# Patient Record
Sex: Male | Born: 1950 | Race: White | Hispanic: No | Marital: Married | Smoking: Former smoker
Health system: Southern US, Community
[De-identification: ages and names within clinical notes are randomized; demographics above are authoritative.]

## PROBLEM LIST (undated history)

## (undated) DIAGNOSIS — D72829 Elevated white blood cell count, unspecified: Secondary | ICD-10-CM

## (undated) DIAGNOSIS — E1165 Type 2 diabetes mellitus with hyperglycemia: Secondary | ICD-10-CM

## (undated) DIAGNOSIS — N179 Acute kidney failure, unspecified: Secondary | ICD-10-CM

## (undated) DIAGNOSIS — I472 Ventricular tachycardia, unspecified: Secondary | ICD-10-CM

## (undated) DIAGNOSIS — I219 Acute myocardial infarction, unspecified: Secondary | ICD-10-CM

## (undated) DIAGNOSIS — I5043 Acute on chronic combined systolic (congestive) and diastolic (congestive) heart failure: Secondary | ICD-10-CM

## (undated) DIAGNOSIS — I5042 Chronic combined systolic (congestive) and diastolic (congestive) heart failure: Secondary | ICD-10-CM

## (undated) DIAGNOSIS — D62 Acute posthemorrhagic anemia: Secondary | ICD-10-CM

## (undated) DIAGNOSIS — G931 Anoxic brain damage, not elsewhere classified: Secondary | ICD-10-CM

## (undated) DIAGNOSIS — R17 Unspecified jaundice: Secondary | ICD-10-CM

## (undated) DIAGNOSIS — D75839 Thrombocytosis, unspecified: Secondary | ICD-10-CM

## (undated) DIAGNOSIS — D473 Essential (hemorrhagic) thrombocythemia: Secondary | ICD-10-CM

## (undated) DIAGNOSIS — J9600 Acute respiratory failure, unspecified whether with hypoxia or hypercapnia: Secondary | ICD-10-CM

## (undated) DIAGNOSIS — I2510 Atherosclerotic heart disease of native coronary artery without angina pectoris: Secondary | ICD-10-CM

## (undated) HISTORY — DX: Acute on chronic combined systolic (congestive) and diastolic (congestive) heart failure: I50.43

## (undated) HISTORY — DX: Acute myocardial infarction, unspecified: I21.9

## (undated) HISTORY — DX: Ventricular tachycardia, unspecified: I47.20

## (undated) HISTORY — DX: Acute posthemorrhagic anemia: D62

## (undated) HISTORY — DX: Acute kidney failure, unspecified: N17.9

## (undated) HISTORY — DX: Elevated white blood cell count, unspecified: D72.829

## (undated) HISTORY — DX: Thrombocytosis, unspecified: D75.839

## (undated) HISTORY — DX: Anoxic brain damage, not elsewhere classified: G93.1

## (undated) HISTORY — DX: Type 2 diabetes mellitus with hyperglycemia: E11.65

## (undated) HISTORY — DX: Atherosclerotic heart disease of native coronary artery without angina pectoris: I25.10

## (undated) HISTORY — DX: Ventricular tachycardia: I47.2

## (undated) HISTORY — DX: Acute respiratory failure, unspecified whether with hypoxia or hypercapnia: J96.00

---
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
## 2006-01-27 ENCOUNTER — Ambulatory Visit (HOSPITAL_COMMUNITY): Admission: RE | Admit: 2006-01-27 | Discharge: 2006-01-27 | Payer: Self-pay | Admitting: Urology

## 2012-01-02 ENCOUNTER — Ambulatory Visit (INDEPENDENT_AMBULATORY_CARE_PROVIDER_SITE_OTHER): Payer: 59 | Admitting: Emergency Medicine

## 2012-01-02 VITALS — BP 137/80 | HR 64 | Temp 97.6°F | Resp 16 | Ht 66.0 in | Wt 190.0 lb

## 2012-01-02 DIAGNOSIS — J029 Acute pharyngitis, unspecified: Secondary | ICD-10-CM

## 2012-01-02 LAB — POCT RAPID STREP A (OFFICE): Rapid Strep A Screen: NEGATIVE

## 2012-01-02 MED ORDER — FIRST-DUKES MOUTHWASH MT SUSP: OROMUCOSAL | Status: DC

## 2012-01-02 MED ORDER — AMOXICILLIN 875 MG PO TABS: 875.0000 mg | ORAL_TABLET | Freq: Two times a day (BID) | ORAL | Status: AC

## 2012-01-02 NOTE — Progress Notes (Signed)
  Subjective:    Patient ID: Lawrence Olson, male    DOB: August 08, 1951, 61 y.o.   [name] [id number]  HPI patient enters with onset Friday of severe sore throat. There's been no definite fever. He has felt swelling in the glands in his neck. He denies cough or cold symptoms. He overall has felt well    Review of Systems noncontributory except as relates to this illness.     Objective:   Physical Exam  Constitutional: He appears well-developed.  HENT:  Right Ear: External ear normal.  Left Ear: External ear normal.       There is redness in the posterior pharynx. There is a purulent material present on the left side of his posterior pharynx. There tender anterior cervical nodes bilaterally.  Eyes: Pupils are equal, round, and reactive to light.  Neck: No tracheal deviation present. No thyromegaly present.   Results for orders placed in visit on 01/02/12  POCT RAPID STREP A (OFFICE)      Component Value Range   Rapid Strep A Screen Negative  Negative          Assessment & Plan:  Patient here with severe sore throat. Purulent drainage in the back. Strep test was negative we'll treat with antibiotics. Throat culture done. We'll also add a gargle to help with throat discomfort

## 2012-01-02 NOTE — Patient Instructions (Signed)
Sore Throat Sore throats may be caused by bacteria and viruses. They may also be caused by:  Smoking.   Pollution.   Allergies.  If a sore throat is due to strep infection (a bacterial infection), you may need:  A throat swab.   A culture test to verify the strep infection.  You will need one of these:  An antibiotic shot.   Oral medicine for a full 10 days.  Strep infection is very contagious. A doctor should check any close contacts who have a sore throat or fever. A sore throat caused by a virus infection will usually last only 3-4 days. Antibiotics will not treat a viral sore throat.  Infectious mononucleosis (a viral disease), however, can cause a sore throat that lasts for up to 3 weeks. Mononucleosis can be diagnosed with blood tests. You must have been sick for at least 1 week in order for the test to give accurate results. HOME CARE INSTRUCTIONS   To treat a sore throat, take mild pain medicine.   Increase your fluids.   Eat a soft diet.   Do not smoke.   Gargling with warm water or salt water (1 tsp. salt in 8 oz. water) can be helpful.   Try throat sprays or lozenges or sucking on hard candy to ease the symptoms.  Call your doctor if your sore throat lasts longer than 1 week.  SEEK IMMEDIATE MEDICAL CARE IF:  You have difficulty breathing.   You have increased swelling in the throat.   You have pain so severe that you are unable to swallow fluids or your saliva.   You have a severe headache, a high fever, vomiting, or a red rash.  Document Released: 09/16/2004 Document Revised: 07/29/2011 Document Reviewed: 07/27/2007 [facility] Patient Information 2012 [place].

## 2012-01-05 ENCOUNTER — Encounter: Payer: Self-pay | Admitting: *Deleted

## 2015-12-08 ENCOUNTER — Ambulatory Visit (INDEPENDENT_AMBULATORY_CARE_PROVIDER_SITE_OTHER): Payer: BLUE CROSS/BLUE SHIELD | Admitting: Internal Medicine

## 2015-12-08 VITALS — BP 172/82 | HR 65 | Temp 97.3°F | Resp 16 | Ht 66.5 in | Wt 196.2 lb

## 2015-12-08 DIAGNOSIS — L259 Unspecified contact dermatitis, unspecified cause: Secondary | ICD-10-CM

## 2015-12-08 MED ORDER — PREDNISONE 20 MG PO TABS: ORAL_TABLET | ORAL | Status: DC

## 2015-12-08 MED ORDER — TRIAMCINOLONE ACETONIDE 0.1 % EX CREA: 1.0000 "application " | TOPICAL_CREAM | Freq: Two times a day (BID) | CUTANEOUS | Status: DC

## 2015-12-08 NOTE — Progress Notes (Signed)
  By signing my name below I, Tereasa Coop, attest that this documentation has been prepared under the direction and in the presence of Tami Lin, MD. Electonically Signed. Tereasa Coop, Scribe 12/08/2015 at 2:04 PM  Subjective:    Patient ID: Lawrence Olson, male    DOB: Dec 30, 1950, 65 y.o.   [name] [id number] Chief Complaint  Patient presents with  . Rash    both wrists/ pt has hx of this same rash     HPI Lawrence Olson is a 65 y.o. male who presents to the [facility] complaining of pruritic rash for the past week. Pt states rash is only on hands, forearms, and wrists bilaterally. Pt states that he was working outside cleaning up brush prior to rash. Pt also states that he also put his hands in a chemical at work. Pt states he has been using an OTC cortisol cream with no relief.  There are no active problems to display for this patient.   Current outpatient prescriptions:  .  Diphenhyd-Hydrocort-Nystatin (FIRST-DUKES MOUTHWASH) SUSP, 1 teaspoon as rinse gargle and spit 4 times a day (Patient not taking: Reported on 12/08/2015), Disp: 120 mL, Rfl: 1  No Known Allergies  Social History   Social History  . Marital Status: Married    Spouse Name: N/A  . Number of Children: N/A  . Years of Education: N/A   Occupational History  . Not on file.   Social History Main Topics  . Smoking status: Never Smoker   . Smokeless tobacco: Not on file  . Alcohol Use: Not on file  . Drug Use: Not on file  . Sexual Activity: Not on file   Other Topics Concern  . Not on file   Social History Narrative     Review of Systems  Skin: Positive for rash.       Objective:   Physical Exam  Constitutional: He is oriented to person, place, and time. He appears well-developed and well-nourished. No distress.  HENT:  Head: Normocephalic and atraumatic.  Eyes: Conjunctivae are normal. Pupils are equal, round, and reactive to light.  Neck: Neck supple.  Cardiovascular:  Normal rate.   Pulmonary/Chest: Effort normal.  Musculoskeletal: Normal range of motion.  Neurological: He is alert and oriented to person, place, and time. Gait normal.  Skin: Skin is warm and dry.  Pt has very fine, papulovesicular lesions in groups and areas of confluence from his elbows distally, bilaterally.   Psychiatric: He has a normal mood and affect. His behavior is normal.  Nursing note and vitals reviewed.  Filed Vitals:   12/08/15 1330  BP: 172/82  Pulse: 65  Temp: 97.3 F (36.3 C)  TempSrc: Oral  Resp: 16  Height: 5' 6.5" (1.689 m)  Weight: 196 lb 3.2 oz (88.996 kg)  SpO2: 97%      Assessment & Plan:  I have completed the patient encounter in its entirety as documented by the scribe, with editing by me where necessary. Gerldine Suleiman P. Laney Pastor, M.D. Contact dermatitis  Meds ordered this encounter  Medications  . predniSONE (DELTASONE) 20 MG tablet    Sig: 3/3/3/2/2/2/1/1/1 single daily dose for 9 days    Dispense:  18 tablet    Refill:  0  . triamcinolone cream (KENALOG) 0.1 %    Sig: Apply 1 application topically 2 (two) times daily.    Dispense:  30 g    Refill:  0   Check BP when well

## 2015-12-08 NOTE — Patient Instructions (Signed)
     IF you received an x-ray today, you will receive an invoice from [facility]. Please contact [facility] at [phone] with questions or concerns regarding your invoice.   IF you received labwork today, you will receive an invoice from [facility]. Please contact [facility] at [phone] with questions or concerns regarding your invoice.   Our billing staff will not be able to assist you with questions regarding bills from these companies.  You will be contacted with the lab results as soon as they are available. The fastest way to get your results is to activate your My Chart account. Instructions are located on the last page of this paperwork. If you have not heard from us regarding the results in 2 weeks, please contact this office.      

## 2017-01-21 ENCOUNTER — Encounter: Payer: Self-pay | Admitting: Physician Assistant

## 2017-01-21 ENCOUNTER — Ambulatory Visit (INDEPENDENT_AMBULATORY_CARE_PROVIDER_SITE_OTHER): Payer: PPO | Admitting: Physician Assistant

## 2017-01-21 VITALS — BP 120/86 | HR 86 | Temp 98.0°F | Resp 18 | Ht 66.5 in | Wt 193.6 lb

## 2017-01-21 DIAGNOSIS — L255 Unspecified contact dermatitis due to plants, except food: Secondary | ICD-10-CM | POA: Diagnosis not present

## 2017-01-21 DIAGNOSIS — R21 Rash and other nonspecific skin eruption: Secondary | ICD-10-CM

## 2017-01-21 MED ORDER — PREDNISONE 20 MG PO TABS: ORAL_TABLET | ORAL | 0 refills | Status: DC

## 2017-01-21 MED ORDER — TRIAMCINOLONE ACETONIDE 0.1 % EX CREA: 1.0000 "application " | TOPICAL_CREAM | Freq: Two times a day (BID) | CUTANEOUS | 0 refills | Status: DC

## 2017-01-21 NOTE — Progress Notes (Signed)
   Lawrence Olson  [name] [id number] DOB: October 31, 1950  PCP: Patient, No Pcp Per  Subjective:  Pt is a 67 year old male who presents to clinic for rash x 1 week. Located on both arms from wrists to elbows. C/o itchiness.  He was working out in his yard before this happened. Rash started on his right forearm. He gets this around the same time every year.   He has tried OTC Benadryl - no relief.  Denies abdominal pain, n/v/d, wheezing, shob.   Review of Systems  Respiratory: Negative for cough, chest tightness, shortness of breath and wheezing.   Skin: Positive for rash.    There are no active problems to display for this patient.   No current outpatient prescriptions on file prior to visit.   No current facility-administered medications on file prior to visit.     No Known Allergies   Objective:  BP (!) 156/101   Pulse 86   Temp 98 F (36.7 C) (Oral)   Resp 18   Ht 5' 6.5" (1.689 m)   Wt 193 lb 9.6 oz (87.8 kg)   SpO2 98%   BMI 30.78 kg/m   Physical Exam  Constitutional: He is oriented to person, place, and time and well-developed, well-nourished, and in no distress. No distress.  Cardiovascular: Normal rate, regular rhythm and normal heart sounds.   Neurological: He is alert and oriented to person, place, and time. GCS score is 15.  Skin: Skin is warm and dry. Rash noted. Rash is maculopapular and urticarial.     Three linear healing papular lesions on right forearm where pt states the rash started.   Psychiatric: Mood, memory, affect and judgment normal.  Vitals reviewed.   Assessment and Plan :  1. Rash and nonspecific skin eruption 2. Plant dermatitis - predniSONE (DELTASONE) 20 MG tablet; 3/3/3/3/3/2/2/2/2/2/1/1/1/1/1 single daily dose for 15 days  Dispense: 30 tablet; Refill: 0 - triamcinolone cream (KENALOG) 0.1 %; Apply 1 application topically 2 (two) times daily.  Dispense: 30 g; Refill: 0 - Information regarding plant dermatitis printed off for patient. Will  treat with oral prednisone and topical. RTC in 1 weeks if no improvement. He understands.   Mercer Pod, PA-C  [facility] 01/21/2017 4:10 PM

## 2017-01-21 NOTE — Patient Instructions (Addendum)
Please take the ENTIRE COURSE of your medication, even if you start to feel better. Apply the cream as directed. Come back if your symptoms worsen.   Please check your blood pressure occasionally while you are out and about. Your blood pressure should be below 140/90. Please come back to the office if your blood pressure remains high.   Thank you for coming in today. I hope you feel we met your needs.  Feel free to call [facility] if you have any questions or further requests.  Please consider signing up for MyChart if you do not already have it, as this is a great way to communicate with me.  Best,  Mercer Pod, PA-C   Poison Ivy Dermatitis Poison ivy dermatitis is inflammation of the skin that is caused by the allergens on the leaves of the poison ivy plant. The skin reaction often involves redness, swelling, blisters, and extreme itching. What are the causes? This condition is caused by a specific chemical (urushiol) found in the sap of the poison ivy plant. This chemical is sticky and can be easily spread to people, animals, and objects. You can get poison ivy dermatitis by:  Having direct contact with a poison ivy plant.  Touching animals, other people, or objects that have come in contact with poison ivy and have the chemical on them.  What increases the risk? This condition is more likely to develop in:  People who are outdoors often.  People who go outdoors without wearing protective clothing, such as closed shoes, long pants, and a long-sleeved shirt.  What are the signs or symptoms? Symptoms of this condition include:  Redness and itching.  A rash that often includes bumps and blisters. The rash usually appears 48 hours after exposure.  Swelling. This may occur if the reaction is more severe.  Symptoms usually last for 1-2 weeks. However, the first time you develop this condition, symptoms may last 3-4 weeks. How is this diagnosed? This condition may be diagnosed based  on your symptoms and a physical exam. Your health care provider may also ask you about any recent outdoor activity. How is this treated? Treatment for this condition will vary depending on how severe it is. Treatment may include:  Hydrocortisone creams or calamine lotions to relieve itching.  Oatmeal baths to soothe the skin.  Over-the-counter antihistamine tablets.  Oral steroid medicine for more severe outbreaks.  Follow these instructions at home:  Take or apply over-the-counter and prescription medicines only as told by your health care provider.  Wash exposed skin as soon as possible with soap and cold water.  Use hydrocortisone creams or calamine lotion as needed to soothe the skin and relieve itching.  Take oatmeal baths as needed. Use colloidal oatmeal. You can get this at your local pharmacy or grocery store. Follow the instructions on the packaging.  Do not scratch or rub your skin.  While you have the rash, wash clothes right after you wear them. How is this prevented?  Learn to identify the poison ivy plant and avoid contact with the plant. This plant can be recognized by the number of leaves. Generally, poison ivy has three leaves with flowering branches on a single stem. The leaves are typically glossy, and they have jagged edges that come to a point at the front.  If you have been exposed to poison ivy, thoroughly wash with soap and water right away. You have about 30 minutes to remove the plant resin before it will cause the rash.  Be sure to wash under your fingernails because any plant resin there will continue to spread the rash.  When hiking or camping, wear clothes that will help you to avoid exposure on the skin. This includes long pants, a long-sleeved shirt, tall socks, and hiking boots. You can also apply preventive lotion to your skin to help limit exposure.  If you suspect that your clothes or outdoor gear came in contact with poison ivy, rinse them off  outside with a garden hose before you bring them inside your house. Contact a health care provider if:  You have open sores in the rash area.  You have more redness, swelling, or pain in the affected area.  You have redness that spreads beyond the rash area.  You have fluid, blood, or pus coming from the affected area.  You have a fever.  You have a rash over a large area of your body.  You have a rash on your eyes, mouth, or genitals.  Your rash does not improve after a few days. Get help right away if:  Your face swells or your eyes swell shut.  You have trouble breathing.  You have trouble swallowing. This information is not intended to replace advice given to you by your health care provider. Make sure you discuss any questions you have with your health care provider. Document Released: 08/06/2000 Document Revised: 01/15/2016 Document Reviewed: 01/15/2015 Elsevier Interactive Patient Education  2018 [employer].  IF you received an x-ray today, you will receive an invoice from [facility]. Please contact [facility] at [phone] with questions or concerns regarding your invoice.   IF you received labwork today, you will receive an invoice from [facility]. Please contact [facility] at [phone] with questions or concerns regarding your invoice.   Our billing staff will not be able to assist you with questions regarding bills from these companies.  You will be contacted with the lab results as soon as they are available. The fastest way to get your results is to activate your My Chart account. Instructions are located on the last page of this paperwork. If you have not heard from [place] regarding the results in 2 weeks, please contact this office.

## 2017-04-14 ENCOUNTER — Telehealth: Payer: Self-pay

## 2017-04-14 NOTE — Telephone Encounter (Signed)
Called pt to schedule Medicare Annual Wellness Visit. -nr  

## 2019-02-20 DIAGNOSIS — L408 Other psoriasis: Secondary | ICD-10-CM | POA: Diagnosis not present

## 2019-02-20 DIAGNOSIS — L309 Dermatitis, unspecified: Secondary | ICD-10-CM | POA: Diagnosis not present

## 2019-03-22 DIAGNOSIS — L309 Dermatitis, unspecified: Secondary | ICD-10-CM | POA: Diagnosis not present

## 2019-03-22 DIAGNOSIS — L408 Other psoriasis: Secondary | ICD-10-CM | POA: Diagnosis not present

## 2019-04-16 ENCOUNTER — Emergency Department (HOSPITAL_COMMUNITY): Payer: PPO

## 2019-04-16 ENCOUNTER — Encounter (HOSPITAL_COMMUNITY)
Admission: EM | Disposition: A | Discharge status: Rehabilitation Facility | Payer: Self-pay | Source: Home / Self Care | Attending: Cardiovascular Disease

## 2019-04-16 ENCOUNTER — Inpatient Hospital Stay (HOSPITAL_COMMUNITY)
Admission: EM | Admit: 2019-04-16 | Discharge: 2019-05-04 | DRG: 215 | Disposition: A | Discharge status: Rehabilitation Facility | Payer: PPO | Attending: Cardiovascular Disease | Admitting: Cardiovascular Disease

## 2019-04-16 ENCOUNTER — Inpatient Hospital Stay (HOSPITAL_COMMUNITY): Payer: PPO

## 2019-04-16 DIAGNOSIS — Z95811 Presence of heart assist device: Secondary | ICD-10-CM | POA: Diagnosis not present

## 2019-04-16 DIAGNOSIS — R4189 Other symptoms and signs involving cognitive functions and awareness: Secondary | ICD-10-CM | POA: Diagnosis not present

## 2019-04-16 DIAGNOSIS — J9690 Respiratory failure, unspecified, unspecified whether with hypoxia or hypercapnia: Secondary | ICD-10-CM | POA: Diagnosis not present

## 2019-04-16 DIAGNOSIS — R319 Hematuria, unspecified: Secondary | ICD-10-CM | POA: Diagnosis not present

## 2019-04-16 DIAGNOSIS — R0789 Other chest pain: Secondary | ICD-10-CM | POA: Diagnosis not present

## 2019-04-16 DIAGNOSIS — J9602 Acute respiratory failure with hypercapnia: Secondary | ICD-10-CM | POA: Diagnosis present

## 2019-04-16 DIAGNOSIS — R918 Other nonspecific abnormal finding of lung field: Secondary | ICD-10-CM | POA: Diagnosis not present

## 2019-04-16 DIAGNOSIS — J811 Chronic pulmonary edema: Secondary | ICD-10-CM | POA: Diagnosis not present

## 2019-04-16 DIAGNOSIS — G92 Toxic encephalopathy: Secondary | ICD-10-CM | POA: Diagnosis present

## 2019-04-16 DIAGNOSIS — D72829 Elevated white blood cell count, unspecified: Secondary | ICD-10-CM | POA: Diagnosis not present

## 2019-04-16 DIAGNOSIS — R451 Restlessness and agitation: Secondary | ICD-10-CM | POA: Diagnosis not present

## 2019-04-16 DIAGNOSIS — D62 Acute posthemorrhagic anemia: Secondary | ICD-10-CM | POA: Diagnosis not present

## 2019-04-16 DIAGNOSIS — E870 Hyperosmolality and hypernatremia: Secondary | ICD-10-CM | POA: Diagnosis not present

## 2019-04-16 DIAGNOSIS — Z4682 Encounter for fitting and adjustment of non-vascular catheter: Secondary | ICD-10-CM | POA: Diagnosis not present

## 2019-04-16 DIAGNOSIS — G253 Myoclonus: Secondary | ICD-10-CM | POA: Diagnosis not present

## 2019-04-16 DIAGNOSIS — E876 Hypokalemia: Secondary | ICD-10-CM | POA: Diagnosis present

## 2019-04-16 DIAGNOSIS — Z03818 Encounter for observation for suspected exposure to other biological agents ruled out: Secondary | ICD-10-CM | POA: Diagnosis not present

## 2019-04-16 DIAGNOSIS — J189 Pneumonia, unspecified organism: Secondary | ICD-10-CM

## 2019-04-16 DIAGNOSIS — J690 Pneumonitis due to inhalation of food and vomit: Secondary | ICD-10-CM | POA: Diagnosis not present

## 2019-04-16 DIAGNOSIS — K5901 Slow transit constipation: Secondary | ICD-10-CM | POA: Diagnosis not present

## 2019-04-16 DIAGNOSIS — Z9289 Personal history of other medical treatment: Secondary | ICD-10-CM | POA: Diagnosis not present

## 2019-04-16 DIAGNOSIS — I472 Ventricular tachycardia, unspecified: Secondary | ICD-10-CM

## 2019-04-16 DIAGNOSIS — Z87891 Personal history of nicotine dependence: Secondary | ICD-10-CM

## 2019-04-16 DIAGNOSIS — X58XXXA Exposure to other specified factors, initial encounter: Secondary | ICD-10-CM | POA: Diagnosis present

## 2019-04-16 DIAGNOSIS — J984 Other disorders of lung: Secondary | ICD-10-CM | POA: Diagnosis not present

## 2019-04-16 DIAGNOSIS — E875 Hyperkalemia: Secondary | ICD-10-CM | POA: Diagnosis not present

## 2019-04-16 DIAGNOSIS — Z20828 Contact with and (suspected) exposure to other viral communicable diseases: Secondary | ICD-10-CM | POA: Diagnosis not present

## 2019-04-16 DIAGNOSIS — R0902 Hypoxemia: Secondary | ICD-10-CM

## 2019-04-16 DIAGNOSIS — S1181XA Laceration without foreign body of other specified part of neck, initial encounter: Secondary | ICD-10-CM | POA: Diagnosis not present

## 2019-04-16 DIAGNOSIS — R570 Cardiogenic shock: Secondary | ICD-10-CM | POA: Diagnosis present

## 2019-04-16 DIAGNOSIS — R7309 Other abnormal glucose: Secondary | ICD-10-CM | POA: Diagnosis not present

## 2019-04-16 DIAGNOSIS — Z8674 Personal history of sudden cardiac arrest: Secondary | ICD-10-CM | POA: Diagnosis not present

## 2019-04-16 DIAGNOSIS — I5021 Acute systolic (congestive) heart failure: Secondary | ICD-10-CM | POA: Diagnosis present

## 2019-04-16 DIAGNOSIS — K7200 Acute and subacute hepatic failure without coma: Secondary | ICD-10-CM | POA: Diagnosis not present

## 2019-04-16 DIAGNOSIS — T380X5A Adverse effect of glucocorticoids and synthetic analogues, initial encounter: Secondary | ICD-10-CM | POA: Diagnosis not present

## 2019-04-16 DIAGNOSIS — I255 Ischemic cardiomyopathy: Secondary | ICD-10-CM | POA: Diagnosis present

## 2019-04-16 DIAGNOSIS — K760 Fatty (change of) liver, not elsewhere classified: Secondary | ICD-10-CM | POA: Diagnosis not present

## 2019-04-16 DIAGNOSIS — Z452 Encounter for adjustment and management of vascular access device: Secondary | ICD-10-CM | POA: Diagnosis not present

## 2019-04-16 DIAGNOSIS — E669 Obesity, unspecified: Secondary | ICD-10-CM | POA: Diagnosis present

## 2019-04-16 DIAGNOSIS — R5381 Other malaise: Secondary | ICD-10-CM | POA: Diagnosis not present

## 2019-04-16 DIAGNOSIS — I493 Ventricular premature depolarization: Secondary | ICD-10-CM | POA: Diagnosis not present

## 2019-04-16 DIAGNOSIS — R0689 Other abnormalities of breathing: Secondary | ICD-10-CM | POA: Diagnosis not present

## 2019-04-16 DIAGNOSIS — N179 Acute kidney failure, unspecified: Secondary | ICD-10-CM

## 2019-04-16 DIAGNOSIS — I499 Cardiac arrhythmia, unspecified: Secondary | ICD-10-CM | POA: Diagnosis not present

## 2019-04-16 DIAGNOSIS — R339 Retention of urine, unspecified: Secondary | ICD-10-CM | POA: Diagnosis not present

## 2019-04-16 DIAGNOSIS — D649 Anemia, unspecified: Secondary | ICD-10-CM | POA: Diagnosis not present

## 2019-04-16 DIAGNOSIS — I2582 Chronic total occlusion of coronary artery: Secondary | ICD-10-CM | POA: Diagnosis present

## 2019-04-16 DIAGNOSIS — I97121 Postprocedural cardiac arrest following other surgery: Secondary | ICD-10-CM

## 2019-04-16 DIAGNOSIS — R404 Transient alteration of awareness: Secondary | ICD-10-CM | POA: Diagnosis not present

## 2019-04-16 DIAGNOSIS — D6489 Other specified anemias: Secondary | ICD-10-CM | POA: Diagnosis present

## 2019-04-16 DIAGNOSIS — J90 Pleural effusion, not elsewhere classified: Secondary | ICD-10-CM | POA: Diagnosis not present

## 2019-04-16 DIAGNOSIS — N170 Acute kidney failure with tubular necrosis: Secondary | ICD-10-CM | POA: Diagnosis not present

## 2019-04-16 DIAGNOSIS — K5900 Constipation, unspecified: Secondary | ICD-10-CM | POA: Diagnosis not present

## 2019-04-16 DIAGNOSIS — R0603 Acute respiratory distress: Secondary | ICD-10-CM

## 2019-04-16 DIAGNOSIS — I213 ST elevation (STEMI) myocardial infarction of unspecified site: Secondary | ICD-10-CM | POA: Diagnosis not present

## 2019-04-16 DIAGNOSIS — G931 Anoxic brain damage, not elsewhere classified: Secondary | ICD-10-CM | POA: Diagnosis not present

## 2019-04-16 DIAGNOSIS — Z7289 Other problems related to lifestyle: Secondary | ICD-10-CM

## 2019-04-16 DIAGNOSIS — R000 Tachycardia, unspecified: Secondary | ICD-10-CM | POA: Diagnosis not present

## 2019-04-16 DIAGNOSIS — I639 Cerebral infarction, unspecified: Secondary | ICD-10-CM | POA: Diagnosis present

## 2019-04-16 DIAGNOSIS — S0990XA Unspecified injury of head, initial encounter: Secondary | ICD-10-CM | POA: Diagnosis not present

## 2019-04-16 DIAGNOSIS — R1310 Dysphagia, unspecified: Secondary | ICD-10-CM | POA: Diagnosis not present

## 2019-04-16 DIAGNOSIS — J9600 Acute respiratory failure, unspecified whether with hypoxia or hypercapnia: Secondary | ICD-10-CM

## 2019-04-16 DIAGNOSIS — I2102 ST elevation (STEMI) myocardial infarction involving left anterior descending coronary artery: Principal | ICD-10-CM | POA: Diagnosis present

## 2019-04-16 DIAGNOSIS — Z79899 Other long term (current) drug therapy: Secondary | ICD-10-CM | POA: Diagnosis not present

## 2019-04-16 DIAGNOSIS — R1312 Dysphagia, oropharyngeal phase: Secondary | ICD-10-CM | POA: Diagnosis not present

## 2019-04-16 DIAGNOSIS — E1159 Type 2 diabetes mellitus with other circulatory complications: Secondary | ICD-10-CM | POA: Diagnosis not present

## 2019-04-16 DIAGNOSIS — I5043 Acute on chronic combined systolic (congestive) and diastolic (congestive) heart failure: Secondary | ICD-10-CM | POA: Diagnosis not present

## 2019-04-16 DIAGNOSIS — I959 Hypotension, unspecified: Secondary | ICD-10-CM | POA: Diagnosis not present

## 2019-04-16 DIAGNOSIS — J9601 Acute respiratory failure with hypoxia: Secondary | ICD-10-CM

## 2019-04-16 DIAGNOSIS — Z978 Presence of other specified devices: Secondary | ICD-10-CM | POA: Diagnosis not present

## 2019-04-16 DIAGNOSIS — T462X5A Adverse effect of other antidysrhythmic drugs, initial encounter: Secondary | ICD-10-CM | POA: Diagnosis not present

## 2019-04-16 DIAGNOSIS — Z9911 Dependence on respirator [ventilator] status: Secondary | ICD-10-CM | POA: Diagnosis not present

## 2019-04-16 DIAGNOSIS — R7982 Elevated C-reactive protein (CRP): Secondary | ICD-10-CM | POA: Diagnosis present

## 2019-04-16 DIAGNOSIS — R0989 Other specified symptoms and signs involving the circulatory and respiratory systems: Secondary | ICD-10-CM | POA: Diagnosis not present

## 2019-04-16 DIAGNOSIS — J9811 Atelectasis: Secondary | ICD-10-CM | POA: Diagnosis not present

## 2019-04-16 DIAGNOSIS — Z992 Dependence on renal dialysis: Secondary | ICD-10-CM | POA: Diagnosis not present

## 2019-04-16 DIAGNOSIS — R4182 Altered mental status, unspecified: Secondary | ICD-10-CM | POA: Diagnosis not present

## 2019-04-16 DIAGNOSIS — D473 Essential (hemorrhagic) thrombocythemia: Secondary | ICD-10-CM | POA: Diagnosis not present

## 2019-04-16 DIAGNOSIS — I2510 Atherosclerotic heart disease of native coronary artery without angina pectoris: Secondary | ICD-10-CM | POA: Diagnosis not present

## 2019-04-16 DIAGNOSIS — E1165 Type 2 diabetes mellitus with hyperglycemia: Secondary | ICD-10-CM | POA: Diagnosis not present

## 2019-04-16 DIAGNOSIS — I469 Cardiac arrest, cause unspecified: Secondary | ICD-10-CM | POA: Diagnosis not present

## 2019-04-16 DIAGNOSIS — I517 Cardiomegaly: Secondary | ICD-10-CM | POA: Diagnosis not present

## 2019-04-16 DIAGNOSIS — I2109 ST elevation (STEMI) myocardial infarction involving other coronary artery of anterior wall: Secondary | ICD-10-CM | POA: Diagnosis not present

## 2019-04-16 DIAGNOSIS — G9340 Encephalopathy, unspecified: Secondary | ICD-10-CM | POA: Diagnosis not present

## 2019-04-16 DIAGNOSIS — E871 Hypo-osmolality and hyponatremia: Secondary | ICD-10-CM | POA: Diagnosis not present

## 2019-04-16 DIAGNOSIS — Z22322 Carrier or suspected carrier of Methicillin resistant Staphylococcus aureus: Secondary | ICD-10-CM

## 2019-04-16 DIAGNOSIS — Z6834 Body mass index (BMI) 34.0-34.9, adult: Secondary | ICD-10-CM

## 2019-04-16 DIAGNOSIS — L8990 Pressure ulcer of unspecified site, unspecified stage: Secondary | ICD-10-CM | POA: Insufficient documentation

## 2019-04-16 HISTORY — PX: RIGHT HEART CATH AND CORONARY ANGIOGRAPHY: CATH118264

## 2019-04-16 HISTORY — DX: Cardiogenic shock: R57.0

## 2019-04-16 HISTORY — DX: Unspecified jaundice: R17

## 2019-04-16 HISTORY — DX: ST elevation (STEMI) myocardial infarction involving left anterior descending coronary artery: I21.02

## 2019-04-16 HISTORY — DX: Cardiac arrest, cause unspecified: I46.9

## 2019-04-16 HISTORY — PX: VENTRICULAR ASSIST DEVICE INSERTION: CATH118273

## 2019-04-16 LAB — POCT I-STAT EG7
Acid-base deficit: 5 mmol/L — ABNORMAL HIGH (ref 0.0–2.0)
Acid-base deficit: 5 mmol/L — ABNORMAL HIGH (ref 0.0–2.0)
Bicarbonate: 23.6 mmol/L (ref 20.0–28.0)
Bicarbonate: 24 mmol/L (ref 20.0–28.0)
Calcium, Ion: 1.12 mmol/L — ABNORMAL LOW (ref 1.15–1.40)
Calcium, Ion: 1.12 mmol/L — ABNORMAL LOW (ref 1.15–1.40)
HCT: 42 % (ref 39.0–52.0)
HCT: 42 % (ref 39.0–52.0)
Hemoglobin: 14.3 g/dL (ref 13.0–17.0)
Hemoglobin: 14.3 g/dL (ref 13.0–17.0)
O2 Saturation: 49 %
O2 Saturation: 51 %
Potassium: 4.1 mmol/L (ref 3.5–5.1)
Potassium: 4.4 mmol/L (ref 3.5–5.1)
Sodium: 140 mmol/L (ref 135–145)
Sodium: 142 mmol/L (ref 135–145)
TCO2: 25 mmol/L (ref 22–32)
TCO2: 26 mmol/L (ref 22–32)
pCO2, Ven: 59.4 mmHg (ref 44.0–60.0)
pCO2, Ven: 61.4 mmHg — ABNORMAL HIGH (ref 44.0–60.0)
pH, Ven: 7.201 — ABNORMAL LOW (ref 7.250–7.430)
pH, Ven: 7.208 — ABNORMAL LOW (ref 7.250–7.430)
pO2, Ven: 33 mmHg (ref 32.0–45.0)
pO2, Ven: 34 mmHg (ref 32.0–45.0)

## 2019-04-16 LAB — COOXEMETRY PANEL
Carboxyhemoglobin: 0.6 % (ref 0.5–1.5)
Methemoglobin: 1.3 % (ref 0.0–1.5)
O2 Saturation: 74.3 %
Total hemoglobin: 12.3 g/dL (ref 12.0–16.0)

## 2019-04-16 LAB — POCT I-STAT 7, (LYTES, BLD GAS, ICA,H+H)
Acid-base deficit: 9 mmol/L — ABNORMAL HIGH (ref 0.0–2.0)
Acid-base deficit: 7 mmol/L — ABNORMAL HIGH (ref 0.0–2.0)
Acid-base deficit: 6 mmol/L — ABNORMAL HIGH (ref 0.0–2.0)
Acid-base deficit: 3 mmol/L — ABNORMAL HIGH (ref 0.0–2.0)
Acid-base deficit: 3 mmol/L — ABNORMAL HIGH (ref 0.0–2.0)
Bicarbonate: 19 mmol/L — ABNORMAL LOW (ref 20.0–28.0)
Bicarbonate: 21.2 mmol/L (ref 20.0–28.0)
Bicarbonate: 21.2 mmol/L (ref 20.0–28.0)
Bicarbonate: 24.5 mmol/L (ref 20.0–28.0)
Bicarbonate: 23.8 mmol/L (ref 20.0–28.0)
Calcium, Ion: 1.1 mmol/L — ABNORMAL LOW (ref 1.15–1.40)
Calcium, Ion: 1.07 mmol/L — ABNORMAL LOW (ref 1.15–1.40)
Calcium, Ion: 1.12 mmol/L — ABNORMAL LOW (ref 1.15–1.40)
Calcium, Ion: 0.94 mmol/L — ABNORMAL LOW (ref 1.15–1.40)
Calcium, Ion: 1.05 mmol/L — ABNORMAL LOW (ref 1.15–1.40)
HCT: 44 % (ref 39.0–52.0)
HCT: 41 % (ref 39.0–52.0)
HCT: 40 % (ref 39.0–52.0)
HCT: 39 % (ref 39.0–52.0)
HCT: 34 % — ABNORMAL LOW (ref 39.0–52.0)
Hemoglobin: 15 g/dL (ref 13.0–17.0)
Hemoglobin: 13.9 g/dL (ref 13.0–17.0)
Hemoglobin: 13.6 g/dL (ref 13.0–17.0)
Hemoglobin: 13.3 g/dL (ref 13.0–17.0)
Hemoglobin: 11.6 g/dL — ABNORMAL LOW (ref 13.0–17.0)
O2 Saturation: 91 %
O2 Saturation: 99 %
O2 Saturation: 92 %
O2 Saturation: 100 %
O2 Saturation: 100 %
Patient temperature: 36.7
Patient temperature: 36.9
Potassium: 3.7 mmol/L (ref 3.5–5.1)
Potassium: 4.2 mmol/L (ref 3.5–5.1)
Potassium: 3.6 mmol/L (ref 3.5–5.1)
Potassium: 4.2 mmol/L (ref 3.5–5.1)
Potassium: 3.4 mmol/L — ABNORMAL LOW (ref 3.5–5.1)
Sodium: 140 mmol/L (ref 135–145)
Sodium: 141 mmol/L (ref 135–145)
Sodium: 141 mmol/L (ref 135–145)
Sodium: 146 mmol/L — ABNORMAL HIGH (ref 135–145)
Sodium: 145 mmol/L (ref 135–145)
TCO2: 21 mmol/L — ABNORMAL LOW (ref 22–32)
TCO2: 23 mmol/L (ref 22–32)
TCO2: 23 mmol/L (ref 22–32)
TCO2: 26 mmol/L (ref 22–32)
TCO2: 25 mmol/L (ref 22–32)
pCO2 arterial: 49.6 mmHg — ABNORMAL HIGH (ref 32.0–48.0)
pCO2 arterial: 51.1 mmHg — ABNORMAL HIGH (ref 32.0–48.0)
pCO2 arterial: 47.6 mmHg (ref 32.0–48.0)
pCO2 arterial: 51.6 mmHg — ABNORMAL HIGH (ref 32.0–48.0)
pCO2 arterial: 47.7 mmHg (ref 32.0–48.0)
pH, Arterial: 7.192 — CL (ref 7.350–7.450)
pH, Arterial: 7.225 — ABNORMAL LOW (ref 7.350–7.450)
pH, Arterial: 7.256 — ABNORMAL LOW (ref 7.350–7.450)
pH, Arterial: 7.283 — ABNORMAL LOW (ref 7.350–7.450)
pH, Arterial: 7.305 — ABNORMAL LOW (ref 7.350–7.450)
pO2, Arterial: 75 mmHg — ABNORMAL LOW (ref 83.0–108.0)
pO2, Arterial: 192 mmHg — ABNORMAL HIGH (ref 83.0–108.0)
pO2, Arterial: 73 mmHg — ABNORMAL LOW (ref 83.0–108.0)
pO2, Arterial: 391 mmHg — ABNORMAL HIGH (ref 83.0–108.0)
pO2, Arterial: 279 mmHg — ABNORMAL HIGH (ref 83.0–108.0)

## 2019-04-16 LAB — CBC WITH DIFFERENTIAL/PLATELET
Abs Immature Granulocytes: 0 10*3/uL (ref 0.00–0.07)
Basophils Absolute: 0.3 10*3/uL — ABNORMAL HIGH (ref 0.0–0.1)
Basophils Relative: 1 %
Eosinophils Absolute: 0 10*3/uL (ref 0.0–0.5)
Eosinophils Relative: 0 %
HCT: 42.9 % (ref 39.0–52.0)
Hemoglobin: 13.8 g/dL (ref 13.0–17.0)
Lymphocytes Relative: 26 %
Lymphs Abs: 7.2 10*3/uL — ABNORMAL HIGH (ref 0.7–4.0)
MCH: 32.2 pg (ref 26.0–34.0)
MCHC: 32.2 g/dL (ref 30.0–36.0)
MCV: 100.2 fL — ABNORMAL HIGH (ref 80.0–100.0)
Monocytes Absolute: 0.8 10*3/uL (ref 0.1–1.0)
Monocytes Relative: 3 %
Neutro Abs: 19.3 10*3/uL — ABNORMAL HIGH (ref 1.7–7.7)
Neutrophils Relative %: 70 %
Platelets: 331 10*3/uL (ref 150–400)
RBC: 4.28 MIL/uL (ref 4.22–5.81)
RDW: 12.4 % (ref 11.5–15.5)
WBC: 27.5 10*3/uL — ABNORMAL HIGH (ref 4.0–10.5)
nRBC: 0 /100 WBC
nRBC: 0.1 % (ref 0.0–0.2)

## 2019-04-16 LAB — COMPREHENSIVE METABOLIC PANEL
ALT: 211 U/L — ABNORMAL HIGH (ref 0–44)
AST: 166 U/L — ABNORMAL HIGH (ref 15–41)
Albumin: 3.5 g/dL (ref 3.5–5.0)
Alkaline Phosphatase: 83 U/L (ref 38–126)
Anion gap: 18 — ABNORMAL HIGH (ref 5–15)
BUN: 20 mg/dL (ref 8–23)
CO2: 18 mmol/L — ABNORMAL LOW (ref 22–32)
Calcium: 8.2 mg/dL — ABNORMAL LOW (ref 8.9–10.3)
Chloride: 104 mmol/L (ref 98–111)
Creatinine, Ser: 1.45 mg/dL — ABNORMAL HIGH (ref 0.61–1.24)
GFR calc Af Amer: 57 mL/min — ABNORMAL LOW (ref >60)
GFR calc non Af Amer: 49 mL/min — ABNORMAL LOW (ref >60)
Glucose, Bld: 316 mg/dL — ABNORMAL HIGH (ref 70–99)
Potassium: 3.6 mmol/L (ref 3.5–5.1)
Sodium: 140 mmol/L (ref 135–145)
Total Bilirubin: 0.8 mg/dL (ref 0.3–1.2)
Total Protein: 6.3 g/dL — ABNORMAL LOW (ref 6.5–8.1)

## 2019-04-16 LAB — URINALYSIS, ROUTINE W REFLEX MICROSCOPIC

## 2019-04-16 LAB — BASIC METABOLIC PANEL
Anion gap: 12 (ref 5–15)
Anion gap: 13 (ref 5–15)
BUN: 22 mg/dL (ref 8–23)
BUN: 23 mg/dL (ref 8–23)
CO2: 24 mmol/L (ref 22–32)
CO2: 20 mmol/L — ABNORMAL LOW (ref 22–32)
Calcium: 6.9 mg/dL — ABNORMAL LOW (ref 8.9–10.3)
Calcium: 7.4 mg/dL — ABNORMAL LOW (ref 8.9–10.3)
Chloride: 105 mmol/L (ref 98–111)
Chloride: 107 mmol/L (ref 98–111)
Creatinine, Ser: 1.44 mg/dL — ABNORMAL HIGH (ref 0.61–1.24)
Creatinine, Ser: 1.54 mg/dL — ABNORMAL HIGH (ref 0.61–1.24)
GFR calc Af Amer: 57 mL/min — ABNORMAL LOW (ref >60)
GFR calc Af Amer: 53 mL/min — ABNORMAL LOW (ref >60)
GFR calc non Af Amer: 50 mL/min — ABNORMAL LOW (ref >60)
GFR calc non Af Amer: 46 mL/min — ABNORMAL LOW (ref >60)
Glucose, Bld: 278 mg/dL — ABNORMAL HIGH (ref 70–99)
Glucose, Bld: 268 mg/dL — ABNORMAL HIGH (ref 70–99)
Potassium: 3.4 mmol/L — ABNORMAL LOW (ref 3.5–5.1)
Potassium: 3.5 mmol/L (ref 3.5–5.1)
Sodium: 141 mmol/L (ref 135–145)
Sodium: 140 mmol/L (ref 135–145)

## 2019-04-16 LAB — GLUCOSE, CAPILLARY
Glucose-Capillary: 222 mg/dL — ABNORMAL HIGH (ref 70–99)
Glucose-Capillary: 236 mg/dL — ABNORMAL HIGH (ref 70–99)
Glucose-Capillary: 239 mg/dL — ABNORMAL HIGH (ref 70–99)

## 2019-04-16 LAB — RAPID URINE DRUG SCREEN, HOSP PERFORMED
Amphetamines: POSITIVE — AB
Barbiturates: NOT DETECTED
Benzodiazepines: POSITIVE — AB
Cocaine: NOT DETECTED
Opiates: NOT DETECTED
Tetrahydrocannabinol: NOT DETECTED

## 2019-04-16 LAB — POCT I-STAT, CHEM 8
BUN: 24 mg/dL — ABNORMAL HIGH (ref 8–23)
Calcium, Ion: 1.07 mmol/L — ABNORMAL LOW (ref 1.15–1.40)
Chloride: 104 mmol/L (ref 98–111)
Creatinine, Ser: 1.1 mg/dL (ref 0.61–1.24)
Glucose, Bld: 310 mg/dL — ABNORMAL HIGH (ref 70–99)
HCT: 44 % (ref 39.0–52.0)
Hemoglobin: 15 g/dL (ref 13.0–17.0)
Potassium: 3.7 mmol/L (ref 3.5–5.1)
Sodium: 140 mmol/L (ref 135–145)
TCO2: 20 mmol/L — ABNORMAL LOW (ref 22–32)

## 2019-04-16 LAB — POCT ACTIVATED CLOTTING TIME
Activated Clotting Time: 428 seconds
Activated Clotting Time: 147 seconds
Activated Clotting Time: 153 seconds

## 2019-04-16 LAB — PROTIME-INR
INR: 1.3 — ABNORMAL HIGH (ref 0.8–1.2)
INR: 1.7 — ABNORMAL HIGH (ref 0.8–1.2)
Prothrombin Time: 16.3 seconds — ABNORMAL HIGH (ref 11.4–15.2)
Prothrombin Time: 19.6 seconds — ABNORMAL HIGH (ref 11.4–15.2)

## 2019-04-16 LAB — URINALYSIS, MICROSCOPIC (REFLEX)

## 2019-04-16 LAB — MRSA PCR SCREENING: MRSA by PCR: POSITIVE — AB

## 2019-04-16 LAB — LIPID PANEL
Cholesterol: 167 mg/dL (ref 0–200)
HDL: 45 mg/dL (ref >40)
LDL Cholesterol: 102 mg/dL — ABNORMAL HIGH (ref 0–99)
Total CHOL/HDL Ratio: 3.7 RATIO
Triglycerides: 99 mg/dL (ref <150)
VLDL: 20 mg/dL (ref 0–40)

## 2019-04-16 LAB — LACTIC ACID, PLASMA: Lactic Acid, Venous: 6.4 mmol/L (ref 0.5–1.9)

## 2019-04-16 LAB — TROPONIN I (HIGH SENSITIVITY)
Troponin I (High Sensitivity): 226 ng/L (ref <18)
Troponin I (High Sensitivity): 25778 ng/L (ref <18)

## 2019-04-16 LAB — APTT
aPTT: 36 seconds (ref 24–36)
aPTT: 52 seconds — ABNORMAL HIGH (ref 24–36)

## 2019-04-16 LAB — SARS CORONAVIRUS 2 (TAT 6-24 HRS): SARS Coronavirus 2: NEGATIVE

## 2019-04-16 SURGERY — RIGHT HEART CATH AND CORONARY ANGIOGRAPHY
Anesthesia: LOCAL

## 2019-04-16 MED ORDER — SODIUM CHLORIDE 0.9% FLUSH
3.0000 mL | Freq: Two times a day (BID) | INTRAVENOUS | Status: DC
  Administered 2019-04-16 – 2019-04-19 (×5): 3 mL via INTRAVENOUS
  Administered 2019-04-21: 10:00:00 10 mL via INTRAVENOUS

## 2019-04-16 MED ORDER — EPINEPHRINE HCL 5 MG/250ML IV SOLN IN NS
0.5000 ug/min | INTRAVENOUS | Status: DC
  Administered 2019-04-16: 3 ug/min via INTRAVENOUS
  Administered 2019-04-16: 2 ug/min via INTRAVENOUS
  Administered 2019-04-17: 5 ug/min via INTRAVENOUS
  Administered 2019-04-18: 3 ug/min via INTRAVENOUS
  Filled 2019-04-16 (×3): qty 250

## 2019-04-16 MED ORDER — SODIUM CHLORIDE 0.9 % IV SOLN
INTRAVENOUS | Status: DC | PRN
  Administered 2019-04-16: 4 ug/kg/min via INTRAVENOUS

## 2019-04-16 MED ORDER — ASPIRIN 300 MG RE SUPP
300.0000 mg | Freq: Once | RECTAL | Status: AC
  Administered 2019-04-16: 300 mg via RECTAL
  Filled 2019-04-16: qty 1

## 2019-04-16 MED ORDER — FENTANYL CITRATE (PF) 100 MCG/2ML IJ SOLN: 25.0000 ug | Freq: Once | INTRAMUSCULAR | Status: DC

## 2019-04-16 MED ORDER — MIDAZOLAM HCL 2 MG/2ML IJ SOLN: 1.0000 mg | Freq: Once | INTRAMUSCULAR | Status: DC

## 2019-04-16 MED ORDER — CISATRACURIUM BOLUS VIA INFUSION: 0.0500 mg/kg | INTRAVENOUS | Status: DC | PRN

## 2019-04-16 MED ORDER — SODIUM CHLORIDE 0.9 % IV SOLN
2.0000 g | Freq: Three times a day (TID) | INTRAVENOUS | Status: DC
  Administered 2019-04-16 – 2019-04-17 (×2): 2 g via INTRAVENOUS
  Filled 2019-04-16 (×4): qty 2

## 2019-04-16 MED ORDER — MIDAZOLAM HCL 2 MG/2ML IJ SOLN: 1.0000 mg | INTRAMUSCULAR | Status: DC | PRN

## 2019-04-16 MED ORDER — NOREPINEPHRINE 4 MG/250ML-% IV SOLN: 0.0000 ug/min | INTRAVENOUS | Status: DC

## 2019-04-16 MED ORDER — TIROFIBAN (AGGRASTAT) BOLUS VIA INFUSION
INTRAVENOUS | Status: DC | PRN
  Administered 2019-04-16: 16:00:00 2000 ug via INTRAVENOUS

## 2019-04-16 MED ORDER — INSULIN REGULAR(HUMAN) IN NACL 100-0.9 UT/100ML-% IV SOLN
INTRAVENOUS | Status: DC
  Administered 2019-04-16: 1.8 [IU]/h via INTRAVENOUS
  Administered 2019-04-17: 10:00:00 7.5 [IU]/h via INTRAVENOUS
  Filled 2019-04-16 (×2): qty 100

## 2019-04-16 MED ORDER — HEPARIN SODIUM (PORCINE) 5000 UNIT/ML IJ SOLN
50000.0000 [IU] | INTRAVENOUS | Status: DC
  Filled 2019-04-16 (×2): qty 10

## 2019-04-16 MED ORDER — FUROSEMIDE 10 MG/ML IJ SOLN
INTRAMUSCULAR | Status: DC | PRN
  Administered 2019-04-16: 40 mg via INTRAVENOUS

## 2019-04-16 MED ORDER — TIROFIBAN HCL IN NACL 5-0.9 MG/100ML-% IV SOLN
INTRAVENOUS | Status: AC
  Filled 2019-04-16: qty 100

## 2019-04-16 MED ORDER — MIDAZOLAM HCL 2 MG/2ML IJ SOLN
INTRAMUSCULAR | Status: AC
  Filled 2019-04-16: qty 2

## 2019-04-16 MED ORDER — CALCIUM GLUCONATE-NACL 2-0.675 GM/100ML-% IV SOLN
2.0000 g | Freq: Once | INTRAVENOUS | Status: DC
  Filled 2019-04-16: qty 100

## 2019-04-16 MED ORDER — POTASSIUM CHLORIDE 10 MEQ/50ML IV SOLN
10.0000 meq | INTRAVENOUS | Status: AC
  Administered 2019-04-16 – 2019-04-17 (×4): 10 meq via INTRAVENOUS
  Filled 2019-04-16 (×4): qty 50

## 2019-04-16 MED ORDER — LABETALOL HCL 5 MG/ML IV SOLN: 10.0000 mg | INTRAVENOUS | Status: AC | PRN

## 2019-04-16 MED ORDER — SODIUM CHLORIDE 0.9 % IV BOLUS
500.0000 mL | Freq: Once | INTRAVENOUS | Status: AC
  Administered 2019-04-16: 500 mL via INTRAVENOUS

## 2019-04-16 MED ORDER — HEPARIN SODIUM (PORCINE) 5000 UNIT/ML IJ SOLN
INTRAMUSCULAR | Status: AC
  Filled 2019-04-16: qty 1

## 2019-04-16 MED ORDER — SODIUM BICARBONATE 8.4 % IV SOLN
50.0000 meq | Freq: Once | INTRAVENOUS | Status: AC
  Administered 2019-04-16: 50 meq via INTRAVENOUS

## 2019-04-16 MED ORDER — ORAL CARE MOUTH RINSE
15.0000 mL | OROMUCOSAL | Status: DC
  Administered 2019-04-16 – 2019-05-01 (×145): 15 mL via OROMUCOSAL

## 2019-04-16 MED ORDER — SODIUM CHLORIDE 0.9% FLUSH: 3.0000 mL | INTRAVENOUS | Status: DC | PRN

## 2019-04-16 MED ORDER — ASPIRIN 300 MG RE SUPP: 300.0000 mg | RECTAL | Status: DC

## 2019-04-16 MED ORDER — MIDAZOLAM HCL 5 MG/5ML IJ SOLN
INTRAMUSCULAR | Status: AC | PRN
  Administered 2019-04-16: 2 mg via INTRAVENOUS

## 2019-04-16 MED ORDER — ONDANSETRON HCL 4 MG/2ML IJ SOLN: 4.0000 mg | Freq: Four times a day (QID) | INTRAMUSCULAR | Status: DC | PRN

## 2019-04-16 MED ORDER — AMIODARONE HCL 150 MG/3ML IV SOLN
INTRAVENOUS | Status: AC
  Filled 2019-04-16: qty 3

## 2019-04-16 MED ORDER — SODIUM CHLORIDE 0.9 % IV SOLN
INTRAVENOUS | Status: DC
  Administered 2019-04-16: 14:00:00 via INTRAVENOUS

## 2019-04-16 MED ORDER — CALCIUM GLUCONATE-NACL 1-0.675 GM/50ML-% IV SOLN
1.0000 g | Freq: Once | INTRAVENOUS | Status: AC
  Administered 2019-04-16: 1000 mg via INTRAVENOUS
  Filled 2019-04-16: qty 50

## 2019-04-16 MED ORDER — BIVALIRUDIN TRIFLUOROACETATE 250 MG IV SOLR
INTRAVENOUS | Status: AC
  Filled 2019-04-16: qty 250

## 2019-04-16 MED ORDER — TIROFIBAN HCL IN NACL 5-0.9 MG/100ML-% IV SOLN
0.0750 ug/kg/min | INTRAVENOUS | Status: DC
  Administered 2019-04-17: 07:00:00 0.15 ug/kg/min via INTRAVENOUS
  Administered 2019-04-17: 0.075 ug/kg/min via INTRAVENOUS
  Administered 2019-04-17: 0.15 ug/kg/min via INTRAVENOUS
  Filled 2019-04-16 (×3): qty 100

## 2019-04-16 MED ORDER — BIVALIRUDIN BOLUS VIA INFUSION - CUPID
INTRAVENOUS | Status: DC | PRN
  Administered 2019-04-16: 60 mg via INTRAVENOUS

## 2019-04-16 MED ORDER — ARTIFICIAL TEARS OPHTHALMIC OINT: 1.0000 "application " | TOPICAL_OINTMENT | Freq: Three times a day (TID) | OPHTHALMIC | Status: DC

## 2019-04-16 MED ORDER — FUROSEMIDE 10 MG/ML IJ SOLN
INTRAMUSCULAR | Status: AC
  Filled 2019-04-16: qty 4

## 2019-04-16 MED ORDER — MIDAZOLAM BOLUS VIA INFUSION: 1.0000 mg | INTRAVENOUS | Status: DC | PRN

## 2019-04-16 MED ORDER — SODIUM CHLORIDE 0.9 % IV SOLN
INTRAVENOUS | Status: DC | PRN
  Administered 2019-04-16: 15:00:00
  Administered 2019-04-16: 15:00:00 1.75 mg/kg/h via INTRAVENOUS

## 2019-04-16 MED ORDER — ASPIRIN 81 MG PO CHEW: 324.0000 mg | CHEWABLE_TABLET | Freq: Once | ORAL | Status: DC

## 2019-04-16 MED ORDER — FENTANYL CITRATE (PF) 100 MCG/2ML IJ SOLN
INTRAMUSCULAR | Status: AC
  Filled 2019-04-16: qty 4

## 2019-04-16 MED ORDER — LIDOCAINE HCL (PF) 1 % IJ SOLN
INTRAMUSCULAR | Status: DC | PRN
  Administered 2019-04-16: 16 mL
  Administered 2019-04-16: 12 mL

## 2019-04-16 MED ORDER — ACETAMINOPHEN 325 MG PO TABS
650.0000 mg | ORAL_TABLET | ORAL | Status: DC | PRN
  Administered 2019-04-17 – 2019-04-18 (×4): 650 mg via ORAL
  Filled 2019-04-16 (×6): qty 2

## 2019-04-16 MED ORDER — SODIUM CHLORIDE 0.9 % IV SOLN: 250.0000 mL | INTRAVENOUS | Status: DC | PRN

## 2019-04-16 MED ORDER — TIROFIBAN HCL IN NACL 5-0.9 MG/100ML-% IV SOLN
INTRAVENOUS | Status: AC | PRN
  Administered 2019-04-16: 0.15 ug/kg/min via INTRAVENOUS

## 2019-04-16 MED ORDER — MIDAZOLAM 50MG/50ML (1MG/ML) PREMIX INFUSION: 2.0000 mg/h | INTRAVENOUS | Status: DC

## 2019-04-16 MED ORDER — FENTANYL 2500MCG IN NS 250ML (10MCG/ML) PREMIX INFUSION
0.0000 ug/h | INTRAVENOUS | Status: DC
  Administered 2019-04-16 (×2): 50 ug/h via INTRAVENOUS
  Administered 2019-04-17 – 2019-04-18 (×2): 125 ug/h via INTRAVENOUS
  Administered 2019-04-18: 175 ug/h via INTRAVENOUS
  Administered 2019-04-19: 150 ug/h via INTRAVENOUS
  Administered 2019-04-19: 175 ug/h via INTRAVENOUS
  Administered 2019-04-20: 08:00:00 150 ug/h via INTRAVENOUS
  Administered 2019-04-20 – 2019-04-21 (×2): 300 ug/h via INTRAVENOUS
  Administered 2019-04-21: 250 ug/h via INTRAVENOUS
  Administered 2019-04-21: 350 ug/h via INTRAVENOUS
  Filled 2019-04-16 (×12): qty 250

## 2019-04-16 MED ORDER — MILRINONE LACTATE IN DEXTROSE 20-5 MG/100ML-% IV SOLN
0.2000 ug/kg/min | INTRAVENOUS | Status: DC
  Administered 2019-04-16 – 2019-04-19 (×4): 0.2 ug/kg/min via INTRAVENOUS
  Filled 2019-04-16 (×4): qty 100

## 2019-04-16 MED ORDER — SODIUM CHLORIDE 0.9 % IV SOLN: 1.0000 ug/kg/min | INTRAVENOUS | Status: DC

## 2019-04-16 MED ORDER — AMIODARONE HCL 150 MG/3ML IV SOLN
INTRAVENOUS | Status: DC | PRN
  Administered 2019-04-16: 150 mg via INTRAVENOUS

## 2019-04-16 MED ORDER — SODIUM CHLORIDE 0.9 % IV SOLN: INTRAVENOUS | Status: DC

## 2019-04-16 MED ORDER — SODIUM BICARBONATE 8.4 % IV SOLN
INTRAVENOUS | Status: AC
  Administered 2019-04-16: 50 meq via INTRAVENOUS
  Filled 2019-04-16: qty 50

## 2019-04-16 MED ORDER — ETOMIDATE 2 MG/ML IV SOLN
INTRAVENOUS | Status: AC | PRN
  Administered 2019-04-16: 10 mg via INTRAVENOUS

## 2019-04-16 MED ORDER — MIDAZOLAM HCL 2 MG/2ML IJ SOLN
INTRAMUSCULAR | Status: DC | PRN
  Administered 2019-04-16: 2 mg via INTRAVENOUS

## 2019-04-16 MED ORDER — EPINEPHRINE 1 MG/10ML IJ SOSY
PREFILLED_SYRINGE | INTRAMUSCULAR | Status: AC
  Filled 2019-04-16: qty 10

## 2019-04-16 MED ORDER — NOREPINEPHRINE BITARTRATE 1 MG/ML IV SOLN
INTRAVENOUS | Status: DC | PRN
  Administered 2019-04-16: 10 ug/min via INTRAVENOUS

## 2019-04-16 MED ORDER — FENTANYL BOLUS VIA INFUSION
25.0000 ug | INTRAVENOUS | Status: DC | PRN
  Administered 2019-04-19 – 2019-04-20 (×3): 50 ug via INTRAVENOUS
  Administered 2019-04-21 (×3): 25 ug via INTRAVENOUS
  Filled 2019-04-16: qty 25

## 2019-04-16 MED ORDER — CANGRELOR TETRASODIUM 50 MG IV SOLR
INTRAVENOUS | Status: AC
  Filled 2019-04-16: qty 50

## 2019-04-16 MED ORDER — CISATRACURIUM BOLUS VIA INFUSION: 0.1000 mg/kg | Freq: Once | INTRAVENOUS | Status: DC

## 2019-04-16 MED ORDER — MIDAZOLAM HCL 2 MG/2ML IJ SOLN
INTRAMUSCULAR | Status: AC
  Filled 2019-04-16: qty 4

## 2019-04-16 MED ORDER — SODIUM BICARBONATE 8.4 % IV SOLN
INTRAVENOUS | Status: AC
  Filled 2019-04-16: qty 50

## 2019-04-16 MED ORDER — PANTOPRAZOLE SODIUM 40 MG IV SOLR
40.0000 mg | Freq: Every day | INTRAVENOUS | Status: DC
  Administered 2019-04-17 – 2019-04-18 (×2): 40 mg via INTRAVENOUS
  Filled 2019-04-16 (×2): qty 40

## 2019-04-16 MED ORDER — MIDAZOLAM HCL 2 MG/2ML IJ SOLN
1.0000 mg | INTRAMUSCULAR | Status: DC | PRN
  Filled 2019-04-16 (×2): qty 2

## 2019-04-16 MED ORDER — CHLORHEXIDINE GLUCONATE 0.12% ORAL RINSE (MEDLINE KIT)
15.0000 mL | Freq: Two times a day (BID) | OROMUCOSAL | Status: DC
  Administered 2019-04-17 – 2019-05-01 (×29): 15 mL via OROMUCOSAL

## 2019-04-16 MED ORDER — NOREPINEPHRINE 4 MG/250ML-% IV SOLN
INTRAVENOUS | Status: AC
  Filled 2019-04-16: qty 250

## 2019-04-16 MED ORDER — SUCCINYLCHOLINE CHLORIDE 20 MG/ML IJ SOLN
INTRAMUSCULAR | Status: AC | PRN
  Administered 2019-04-16: 100 mg via INTRAVENOUS

## 2019-04-16 MED ORDER — IOHEXOL 350 MG/ML SOLN
INTRAVENOUS | Status: DC | PRN
  Administered 2019-04-16: 140 mL via INTRA_ARTERIAL

## 2019-04-16 MED ORDER — EPINEPHRINE 1 MG/10ML IJ SOSY
PREFILLED_SYRINGE | INTRAMUSCULAR | Status: DC | PRN
  Administered 2019-04-16: 0.5 mg via INTRAVENOUS

## 2019-04-16 MED ORDER — HEPARIN (PORCINE) IN NACL 1000-0.9 UT/500ML-% IV SOLN
INTRAVENOUS | Status: AC
  Filled 2019-04-16: qty 1000

## 2019-04-16 MED ORDER — HEPARIN (PORCINE) 25000 UT/250ML-% IV SOLN
200.0000 [IU]/h | INTRAVENOUS | Status: DC
  Administered 2019-04-16: 200 [IU]/h via INTRAVENOUS
  Filled 2019-04-16: qty 250

## 2019-04-16 MED ORDER — HEPARIN SODIUM (PORCINE) 5000 UNIT/ML IJ SOLN: 4000.0000 [IU] | Freq: Once | INTRAMUSCULAR | Status: DC

## 2019-04-16 MED ORDER — CANGRELOR BOLUS VIA INFUSION
INTRAVENOUS | Status: DC | PRN
  Administered 2019-04-16: 15:00:00 2400 ug via INTRAVENOUS

## 2019-04-16 MED ORDER — MIDAZOLAM 50MG/50ML (1MG/ML) PREMIX INFUSION
0.5000 mg/h | INTRAVENOUS | Status: DC
  Administered 2019-04-16 (×2): 2 mg/h via INTRAVENOUS
  Administered 2019-04-16: 3 mg/h via INTRAVENOUS
  Administered 2019-04-16: 2 mg/h via INTRAVENOUS
  Administered 2019-04-17: 17:00:00 4 mg/h via INTRAVENOUS
  Administered 2019-04-17: 23:00:00 6 mg/h via INTRAVENOUS
  Administered 2019-04-18: 8 mg/h via INTRAVENOUS
  Filled 2019-04-16 (×5): qty 50

## 2019-04-16 MED ORDER — HYDRALAZINE HCL 20 MG/ML IJ SOLN: 10.0000 mg | INTRAMUSCULAR | Status: AC | PRN

## 2019-04-16 MED ORDER — LIDOCAINE HCL (PF) 1 % IJ SOLN
INTRAMUSCULAR | Status: AC
  Filled 2019-04-16: qty 30

## 2019-04-16 MED ORDER — NOREPINEPHRINE 16 MG/250ML-% IV SOLN
0.0000 ug/min | INTRAVENOUS | Status: DC
  Administered 2019-04-16: 12 ug/min via INTRAVENOUS
  Filled 2019-04-16: qty 250

## 2019-04-16 MED ORDER — ASPIRIN 81 MG PO CHEW
81.0000 mg | CHEWABLE_TABLET | Freq: Every day | ORAL | Status: DC
  Administered 2019-04-17 – 2019-04-30 (×14): 81 mg via ORAL
  Filled 2019-04-16 (×14): qty 1

## 2019-04-16 MED ORDER — SODIUM CHLORIDE 0.9 % IV SOLN
INTRAVENOUS | Status: AC | PRN
  Administered 2019-04-16: 10 mL/h via INTRAVENOUS

## 2019-04-16 MED ORDER — FENTANYL CITRATE (PF) 100 MCG/2ML IJ SOLN: 50.0000 ug | Freq: Once | INTRAMUSCULAR | Status: DC

## 2019-04-16 MED ORDER — INSULIN ASPART 100 UNIT/ML ~~LOC~~ SOLN
2.0000 [IU] | SUBCUTANEOUS | Status: DC
  Administered 2019-04-16: 6 [IU] via SUBCUTANEOUS

## 2019-04-16 MED ORDER — FENTANYL CITRATE (PF) 100 MCG/2ML IJ SOLN
INTRAMUSCULAR | Status: AC | PRN
  Administered 2019-04-16: 50 ug via INTRAVENOUS

## 2019-04-16 MED ORDER — CHLORHEXIDINE GLUCONATE CLOTH 2 % EX PADS
6.0000 | MEDICATED_PAD | Freq: Every day | CUTANEOUS | Status: DC
  Administered 2019-04-17 – 2019-04-24 (×7): 6 via TOPICAL

## 2019-04-16 MED ORDER — VANCOMYCIN HCL 10 G IV SOLR
1250.0000 mg | INTRAVENOUS | Status: DC
  Administered 2019-04-16: 1250 mg via INTRAVENOUS
  Filled 2019-04-16 (×2): qty 1250

## 2019-04-16 MED ORDER — HEPARIN (PORCINE) IN NACL 1000-0.9 UT/500ML-% IV SOLN
INTRAVENOUS | Status: DC | PRN
  Administered 2019-04-16 (×4): 500 mL

## 2019-04-16 SURGICAL SUPPLY — 31 items
BAG SNAP BAND KOVER 36X36 (MISCELLANEOUS) ×1 IMPLANT
BALLN SAPPHIRE 2.0X12 (BALLOONS) ×4
BALLN SAPPHIRE 2.5X12 (BALLOONS) ×2
BALLN SPRINTER MX OTW 2.0X12 (BALLOONS) ×2
BALLOON SAPPHIRE 2.0X12 (BALLOONS) IMPLANT
BALLOON SAPPHIRE 2.5X12 (BALLOONS) IMPLANT
BALLOON SPRINTER MX OTW 2.0X12 (BALLOONS) IMPLANT
CATH INFINITI 5FR MULTPACK ANG (CATHETERS) ×1 IMPLANT
CATH LAUNCHER 6FR EBU3.5 (CATHETERS) ×1 IMPLANT
CATH LAUNCHER 6FR JL4 (CATHETERS) ×1 IMPLANT
CATH SWAN GANZ VIP 7.5F (CATHETERS) ×1 IMPLANT
CATH VISTA GUIDE 6FR XBLAD3.5 (CATHETERS) ×1 IMPLANT
COVER DOME SNAP 22 D (MISCELLANEOUS) ×1 IMPLANT
GUIDEWIRE .025 260CM (WIRE) ×1 IMPLANT
KIT ENCORE 26 ADVANTAGE (KITS) ×1 IMPLANT
KIT HEART LEFT (KITS) ×2 IMPLANT
PACK CARDIAC CATHETERIZATION (CUSTOM PROCEDURE TRAY) ×2 IMPLANT
SET IMPELLA CP PUMP (CATHETERS) ×1 IMPLANT
SHEATH PINNACLE 6F 10CM (SHEATH) ×1 IMPLANT
SHEATH PINNACLE 7F 10CM (SHEATH) ×1 IMPLANT
SHEATH PINNACLE 8F 10CM (SHEATH) ×1 IMPLANT
SHEATH PROBE COVER 6X72 (BAG) ×2 IMPLANT
SLEEVE REPOSITIONING LENGTH 30 (MISCELLANEOUS) ×1 IMPLANT
TRANSDUCER W/STOPCOCK (MISCELLANEOUS) ×2 IMPLANT
TRAY CATH 3LUMEN 20C SULFAFREE (CATHETERS) ×1 IMPLANT
TUBING CIL FLEX 10 FLL-RA (TUBING) ×2 IMPLANT
WIRE ASAHI FIELDER XT 300CM (WIRE) ×1 IMPLANT
WIRE EMERALD 3MM-J .035X150CM (WIRE) ×2 IMPLANT
WIRE HI TORQ BMW 300CM (WIRE) ×1 IMPLANT
WIRE HI TORQ WHISPER MS 300CM (WIRE) ×1 IMPLANT
WIRE PT2 MS 300CM (WIRE) ×1 IMPLANT

## 2019-04-16 NOTE — Progress Notes (Signed)
[facility] Progress Note Patient Name: Lawrence Olson DOB: 1950-10-03 [name] [id number]   Date of Service  04/16/2019  HPI/Events of Note  K+ = 3.4 and Creatinine = 1.44.  eICU Interventions  Will replace K+.      Intervention Category Major Interventions: Electrolyte abnormality - evaluation and management  [name],[name] Cornelia Copa 04/16/2019, 9:18 PM

## 2019-04-16 NOTE — Progress Notes (Signed)
[facility] Progress Note Patient Name: Lawrence Olson DOB: Mar 22, 1951 [name] [id number]   Date of Service  04/16/2019  HPI/Events of Note  ABG on 100%/PRVC 16/TV550/P 10 = 7.256/47.6/73.0  eICU Interventions  Will order: 1. Increase PRVC rate to 21. 2. ABG at 8:45 PM.      Intervention Category Major Interventions: Acid-Base disturbance - evaluation and management;Respiratory failure - evaluation and management  Lysle Dingwall 04/16/2019, 7:39 PM

## 2019-04-16 NOTE — ED Notes (Signed)
4000 U Heparin given

## 2019-04-16 NOTE — ED Triage Notes (Signed)
Pt here from work where he had witnessed collapse. CPR initiated by FD. Pt shocked multiple times with EMS, 8 epi, 450 amiodorone, 1000 NS, mag given PTA. ROSC 1249.

## 2019-04-16 NOTE — CV Procedure (Signed)
Central Venous Catheter Insertion Procedure Note Ibrahima Holberg [id number] 1950/11/26    Procedure: Insertion of Central Venous Catheter Indications: Drug and/or fluid administration   Procedure Details Consent: Emergent consent Time Out: Verified patient identification, verified procedure, site/side was marked, verified correct patient position, special equipment/implants available, medications/allergies/relevent history reviewed, required imaging and test results available.  Performed   Maximum sterile technique was used including antiseptics, cap, gloves, gown, hand hygiene, mask and sheet. Skin prep: Chlorhexidine; local anesthetic administered A antimicrobial bonded/coated triple lumen catheter was placed in the left internal jugular vein using the Seldinger technique and u/s guidance.   Evaluation Blood flow good Complications: No apparent complications Patient did tolerate procedure well. Chest X-ray ordered to verify placement.  CXR: ok   Glori Bickers, MD  5:52 PM

## 2019-04-16 NOTE — Progress Notes (Signed)
Pt unavailable for EEG; pt in cath lab. Will do EEG tomorrow as schedule permits.

## 2019-04-16 NOTE — H&P (Addendum)
Cardiology Admission History and Physical:   Patient ID: Lawrence Olson [name] [id number]; DOB: [birth year]   Admission date: 04/16/2019  Primary Care Provider: Patient, No Pcp Per Primary Cardiologist: No primary care provider on file. *None new to [facility] (Dr. Claiborne Billings) Primary Electrophysiologist:  None   Chief Complaint: Out of hospital cardiac arrest with anterior MI  Patient Profile:   Lawrence Olson is a 68 y.o. male with unknown past medical history who was brought in via EMS post cardiac arrest and found to have anterior ST elevation MI on return of spontaneous circulatory rhythm.  History of Present Illness:   Lawrence Olson was apparently doing relatively well, was at work today.  He mentioned to his coworkers that he was not feeling very well and then collapsed at his worksite.  By report there was some onlooker CPR, however upon EMS arrival they were not sure if he had a pulse or not.  CPR was performed.  He apparently had at least 6 or 7 rounds of epinephrine and CPR with a total of 150 mg of IV amiodarone.  Unclear if there were any shocks, but there was restoration of spontaneous rhythm and clear anterior ST elevations.  In the field intubation was with a Edison Pace airway.  This was changed over to a standard ETT in the ER.  He was noted to be quite hypoxic on saturations.  PEEP was turned up to 10 this did have some improvement with recruitment and increased his oxygen saturations upon arrival to the Cath Lab.  In the ER he was for intubation he was given etomidate and succinylcholine for intubation.  He then began to wake up prior to bringing him upstairs to the Cath Lab and was given 10 mg IV Versed and 50 mcg IV fentanyl.  The need for ETT change out & COVID Screening led to necessary System Delay in arrival to Cardiac Cath Lab.  Despite prolonged downtime, the ER physician felt like the patient did have some purposeful movement and equal reactive pupils.  We therefore decided that  it was prudent to proceed to the Cath Lab for urgent catheterization.  Upon arrival to the Cath Lab, his oxygen levels improved notably with saturations in the mid 90s.  ABG showed pH of 7.19, PaO2 of 73 PCO2 of 49.   NO KNOWN PAST MEDICAL OR SURGICAL HISTORY UPON ARRIVAL.  No past surgical history on file.    Medications Prior to Admission: Prior to Admission medications   Medication Sig Start Date End Date Taking? Authorizing Provider  fluocinolone (SYNALAR) 0.01 % external solution Apply 1 application topically 2 (two) times daily. For 14 days 03/22/19   [provider]  ketoconazole (NIZORAL) 2 % shampoo Apply 5 mLs topically 3 (three) times a week. 03/17/19   [provider]     Allergies:   No Known Allergies  Social History: Unable to obtain history patient was intubated upon arrival.  Family members are here   Family History: Unknown.  Family members not currently available to provide history The patient's family history is not on file.    ROS: Unable to assess due to the patient being intubated and sedated Please see the history of present illness.  All other ROS reviewed and negative.     Physical Exam/Data:   Vitals:   04/16/19 1335 04/16/19 1347 04/16/19 1349  BP:  114/86   Pulse:  95   Resp:  16   Temp:   (!) 95.1 F (35.1 C)  TempSrc:  Tympanic  SpO2:  (!) 84%   Height: 5' 6.5" (1.689 m)     No intake or output data in the 24 hours ending 04/16/19 1441 Last 3 Weights 01/21/2017 12/08/2015 01/02/2012  Weight (lbs) 193 lb 9.6 oz 196 lb 3.2 oz 190 lb  Weight (kg) 87.816 kg 88.996 kg 86.183 kg     Body mass index is 30.78 kg/m.  General: Intubated sedated, paralyzed upon my evaluation (per EA ER doctor, the patient was having some possible purposeful movements and had equal and reactive pupils) HEENT: normal Lymph: no adenopathy Neck: no obvious JVD Endocrine:  No thryomegaly Vascular: No carotid bruits; FA pulses 2+ bilaterally without  bruits  Cardiac: RRR with ectopy.  Normal S1 and S2. Lungs:  clear to auscultation bilaterally, mild diffuse upper airway rhonchi Abd: soft, nontender, no hepatomegaly  Ext: no clubbing/cyanosis or edema Musculoskeletal:  No deformities, BUE and BLE strength normal and equal Skin: warm and dry  Neuro: Intubated, sedated and paralyzed  EKG:  The ECG that was done via EMS was personally reviewed and demonstrates sinus rhythm with PVCs.  10+ millimeter ST elevations in V5 and V6 with 6 to 8 mm elevations in V4.  4 to 5 mm ST elevations in I and aVL with depressions in II, 3 and aVF.  Also depressions noted in V3. ->  This is postarrest EKG following ROSC  Relevant CV Studies: No prior studies  Laboratory Data:  High Sensitivity Troponin:  No results for input(s): TROPONINIHS in the last 720 hours.    Cardiac EnzymesNo results for input(s): TROPONINI in the last 168 hours. No results for input(s): TROPIPOC in the last 168 hours.  ChemistryNo results for input(s): NA, K, CL, CO2, GLUCOSE, BUN, CREATININE, CALCIUM, GFRNONAA, GFRAA, ANIONGAP in the last 168 hours.  No results for input(s): PROT, ALBUMIN, AST, ALT, ALKPHOS, BILITOT in the last 168 hours. Hematology Recent Labs  Lab 04/16/19 1335  WBC 27.5*  RBC 4.28  HGB 13.8  HCT 42.9  MCV 100.2*  MCH 32.2  MCHC 32.2  RDW 12.4  PLT 331   BNPNo results for input(s): BNP, PROBNP in the last 168 hours.  DDimer No results for input(s): DDIMER in the last 168 hours.   Radiology/Studies:  Dg Chest Portable 1 View  Result Date: 04/16/2019 CLINICAL DATA:  Intubation.  Cardiac arrest status post CPR. EXAM: PORTABLE CHEST 1 VIEW COMPARISON:  None. FINDINGS: Endotracheal tube in good position with the tip 4.6 cm above the carina. Enteric tube entering the stomach with the tip below the field of view. The heart size and mediastinal contours are within normal limits. Mild diffuse ill-defined interstitial opacity with hazy airspace disease in the  right upper lobe. No pleural effusion or pneumothorax. No acute osseous abnormality. IMPRESSION: 1. Appropriately positioned endotracheal tube. Enteric tube entering the stomach with the tip below the field of view. 2. Hazy diffuse interstitial and right upper lobe airspace disease, favor pulmonary edema. Electronically Signed   By: Titus Dubin M.D.   On: 04/16/2019 13:53    Assessment and Plan:   Principal Problem:   Acute ST elevation myocardial infarction (STEMI) involving left anterior descending (LAD) coronary artery (HCC) Active Problems:   Cardiac arrest (HCC)   Acute respiratory failure with hypoxia and hypercarbia (HCC)   Cardiogenic shock (HCC)  Out-of-hospital cardiac arrest with clear anterior ST elevations on EKG and initial signs of cardiogenic shock with acute restaurant failure with hypoxemia and hypercapnia by ABG in Cath Lab.  Plan will be to emergently for PCI of LAD. We will plan Impella support for impending cardiogenic shock  Consider code cool per PCCM  Prior history unknown.  For now hemodynamic support with   Severity of Illness: The appropriate patient status for this patient is INPATIENT. Inpatient status is judged to be reasonable and necessary in order to provide the required intensity of service to ensure the patient's safety. The patient's presenting symptoms, physical exam findings, and initial radiographic and laboratory data in the context of their chronic comorbidities is felt to place them at high risk for further clinical deterioration. Furthermore, it is not anticipated that the patient will be medically stable for discharge from the hospital within 2 midnights of admission. The following factors support the patient status of inpatient.   " The patient's presenting symptoms include out-of-hospital cardiac arrest. " The worrisome physical exam findings include coarse rale, intubated and sedated. " The initial radiographic and laboratory data are  worrisome because of grossly abnormal EKG, patchy infiltrates on chest x-ray. " The chronic co-morbidities include unknown.   * I certify that at the point of admission it is my clinical judgment that the patient will require inpatient hospital care spanning beyond 2 midnights from the point of admission due to high intensity of service, high risk for further deterioration and high frequency of surveillance required.*     -- Cath showed 100% pLAD with L-L & R-L collaterals,.  Unable to cross LAD lesion with multiple wires, etc (?subacute occlusion).  - plan to run aggrastat o/n & attempt later.  -- RHC post Cath showed CO/CI 2.6/1.3 --> decided to place Impella.   For questions or updates, please contact [place] Please consult [email or web address] for contact info under    [place] [facility] Shock Patient Intake Sheet  1. Complete this form for all MI patients presenting with Cardiogenic Shock. 2. This form must be completed by a Cath Lab Super-Tech or Interventionalist. 3. Once completed please EPIC message Philemon Kingdom J   1. Inclusion criteria:  Choose all that apply:  []  Symptoms of acute myocardial infarction with ECG and/biomarker evidence of S-T elevation myocardial infarction or non-S-T myocardial infarction.  []  Systolic blood pressure < 72mmHg at baseline OR use of inotopes or vasopressors to maintain SBP >45mmHg + LVEDP >=29mmHg  [x]  Evidence of end organ hypoperfusion  []  Patient undergoes PCI  2. Exclusion criteria:  Was there a reason to exclude patient from the Clinical Shock Protocol?     (if YES, check reason and rest of form will not need to be filled out)           []  Evidence of anoxic brain injury           []  Unwitnessed out of hospital cardiac arrest or any       cardiac arrest in which return of spontaneous circulation (ROSC) is not achieved in 30 minutes.           []  IABP placed prior to Impella           []  Patient already  supported with an Impella           []  Septic, anaphylactic and hemorrhage causes of shock           []  Neurologic and Non-ischemic cause of shock/hypotension (pulmonary embolism, pneumothorax, myocarditis, tamponade, etc.)           []  Active bleeding for which mechanical circulatory   support is contraindicated           []   Recent major surgery for which mechanical circulatory support is contraindicated            []  Mechanical complications of AMI (acute ventricular septal defect (VSD) or acute papillary muscle rupture)           []  Known left ventricular thrombus for which mechanical circulatory support is contraindicated           []  Mechanical aortic prosthetic valve           []  Contraindication to intravenous systemic anticoagulation  []  Yes            [x]  No  3.  Was LVEDP obtained before PCI and Impella? 18- 22 mmHg [x]  Yes            []  No  If obtained pre PCI/Impella was it over 15 mm? []  Yes            []  No  4.  Was "Severe Shock" identified prior PCI? If YES, select which item was present to identify "severe shock")  []  SBP <21mm  []  High dose pressors  [x]  Shock with SBP 80-90 or low dose pressors only, but with with EF <30% in anterior STEMI or Prox LAD or Left Main  []  Shock with SBP 80-90 or only on low pressors but with EF <20% in interior or lateral MI or RCA or Circ as culpit lesion.  [x]  Impella placed   []  pre PCI                               [x]  post PCI  []  Was Right heart cath performed prior to leaving cath lab?                  []  YES        []  NO  []  Yes            []  No  5.  "Not Severe Shock" identified prior to PCI?  If NO, then patient is presumed to have had a "severe shock" and rest of questions do not need to be answered)  a. If YES was impella placed? []  Yes            []  No  b. If Impella was placed post PCI, was the patient still in Shock before Impella was placed? [x]  Yes            []  No  c. Was Right heart cath performed before Impella  placement? [x]  Yes            []  No  d. If YES, what were the following values pre-Impella placement? Cardiac Index: Click or tap here to enter text. Cardiac Power: Click or tap here to enter text.  e. If Impella placed, state why.  []  Patient deteriorated into "severe shock" post PCI as defined by criteria of the [place] [facility].  [x]  CI or CPO Low post PCI by RCA  []  Other, (please briefly explain to the right ? If other, briefly explain here:  Unable to revascularize.  Low CO/CI by RHC. Despite moderate LVEDP & pressures well controlled with moderate dose Levophed.          Signed, Glenetta Hew, MD  04/16/2019 2:41 PM

## 2019-04-16 NOTE — Progress Notes (Signed)
Advanced Heart Failure Team Consult Note   Primary Physician: Patient, No Pcp Per PCP-Cardiologist:  No primary care provider on file.  Reason for Consultation: Cardiogenic shock  HPI:    Lawrence Olson is seen today for evaluation of cardiogenic shock at the request of Dr. Claiborne Billings  68 yo male unknown past medical history presented to the emergency department via EMS with cardiac arrest.  Per EMS, the patient was at work and suddenly collapsed with immediate bystander CPR performed.  EMS arrived and performed approximately 20 minutes of CPR, King airway placed.  Patient was found to be in ventricular fibrillation.  He received 7 epinephrines and multiple shocks as well as 450 mg of amiodarone. ROSC was obtained and patient was transported to the emergency department.  He was intubated in the emergency department and reportedly moving spontaneously with some purposeful movements to pain. He was hypoxic which improved with definitive airway placement and PEEP increased to 10. ABG showed pH of 7.19, PaO2 of 73 PCO2 of 49. He was breathing spontaneously on his own.  Was noted to have anterolateral STEMI on EKG and taken directly to the Cath Lab.  Patient was found to have occluded proximal LAD. Despite multiple attempts LAD unable to be opened and appeared somewhat chronic. BP initially low but improved on NE 10  Swan placed with showed cardiogenic shock on NE RA 13 RV 42/16 PA 40/26 (31) PCWP 18 Pa sat 50%  Fick 2.7/1.3 CPO 0.6  Impella placed in left femoral artery by Dr. Claiborne Billings. Patient then developed VT and received another shock and had hypotension. Started on wpi 2 and given bicarb  I was called an assisted with Impella placement/securing. I also placed a left IJ triple lumen and then accompanied patient to the CCU    Review of Systems: Unavailable as patient intubated/sedated  Home Medications Prior to Admission medications   Medication Sig Start Date End Date Taking? Authorizing  Provider  fluocinolone (SYNALAR) 0.01 % external solution Apply 1 application topically 2 (two) times daily. For 14 days 03/22/19   [provider]  ketoconazole (NIZORAL) 2 % shampoo Apply 5 mLs topically 3 (three) times a week. 03/17/19   [provider]    Past Medical History: No past medical history on file. - Unavailable as patient intubated/sedated  Past Surgical History: No past surgical history on file. Unavailable as patient intubated/sedated  Family History: No family history on file. Unavailable as patient intubated/sedated  Social History: Social History   Socioeconomic History  . Marital status: Married    Spouse name: Not on file  . Number of children: Not on file  . Years of education: Not on file  . Highest education level: Not on file  Occupational History  . Not on file  Social Needs  . Financial resource strain: Not on file  . Food insecurity    Worry: Not on file    Inability: Not on file  . Transportation needs    Medical: Not on file    Non-medical: Not on file  Tobacco Use  . Smoking status: Former Research scientist (life sciences)  . Smokeless tobacco: Never Used  Substance and Sexual Activity  . Alcohol use: Yes    Comment: rarely  . Drug use: Not on file  . Sexual activity: Not on file  Lifestyle  . Physical activity    Days per week: Not on file    Minutes per session: Not on file  . Stress: Not on file  Relationships  .  Social Herbalist on phone: Not on file    Gets together: Not on file    Attends religious service: Not on file    Active member of club or organization: Not on file    Attends meetings of clubs or organizations: Not on file    Relationship status: Not on file  Other Topics Concern  . Not on file  Social History Narrative  . Not on file    Allergies:  No Known Allergies  Objective:    Vital Signs:   Temp:  [95.1 F (35.1 C)] 95.1 F (35.1 C) (08/24 1349) Pulse Rate:  [95] 95 (08/24 1347) Resp:  [16] 16  (08/24 1347) BP: (114)/(86) 114/86 (08/24 1347) SpO2:  [84 %] 84 % (08/24 1347) FiO2 (%):  [100 %] 100 % (08/24 1335)    Weight change: There were no vitals filed for this visit.  Intake/Output:   Intake/Output Summary (Last 24 hours) at 04/16/2019 1739 Last data filed at 04/16/2019 1734 Gross per 24 hour  Intake -  Output 400 ml  Net -400 ml      Physical Exam    General:  Intubated/sedated/ somewhat mottled HEENT: normal +ETT Neck: supple. JVP elevated. Carotids 2+ bilat; no bruits. No lymphadenopathy or thyromegaly appreciated. Cor: PMI nondisplaced. Regular rate & rhythm. No rubs, gallops or murmurs.  Lungs: + coarse Abdomen: obese soft, nontender, nondistended. No hepatosplenomegaly. No bruits or masses. Good bowel sounds. Extremities: mild cyanosis. np clubbing, rash, edema  RFA arterial line RFV swan  LFA impella Neuro: alert & orientedx3, cranial nerves grossly intact. moves all 4 extremities w/o difficulty. Affect pleasant   Telemetry   Sinus Personally reviewed   EKG    Sinus with marked anterolateral ST elevation  Labs   Basic Metabolic Panel: Recent Labs  Lab 04/16/19 1335  NA 140  K 3.6  CL 104  CO2 18*  GLUCOSE 316*  BUN 20  CREATININE 1.45*  CALCIUM 8.2*    Liver Function Tests: Recent Labs  Lab 04/16/19 1335  AST 166*  ALT 211*  ALKPHOS 83  BILITOT 0.8  PROT 6.3*  ALBUMIN 3.5   No results for input(s): LIPASE, AMYLASE in the last 168 hours. No results for input(s): AMMONIA in the last 168 hours.  CBC: Recent Labs  Lab 04/16/19 1335  WBC 27.5*  NEUTROABS 19.3*  HGB 13.8  HCT 42.9  MCV 100.2*  PLT 331    Cardiac Enzymes: No results for input(s): CKTOTAL, CKMB, CKMBINDEX, TROPONINI in the last 168 hours.  BNP: BNP (last 3 results) No results for input(s): BNP in the last 8760 hours.  ProBNP (last 3 results) No results for input(s): PROBNP in the last 8760 hours.   CBG: No results for input(s): GLUCAP in the last  168 hours.  Coagulation Studies: Recent Labs    04/16/19 1335  LABPROT 16.3*  INR 1.3*     Imaging   Dg Chest Portable 1 View  Result Date: 04/16/2019 CLINICAL DATA:  Intubation.  Cardiac arrest status post CPR. EXAM: PORTABLE CHEST 1 VIEW COMPARISON:  None. FINDINGS: Endotracheal tube in good position with the tip 4.6 cm above the carina. Enteric tube entering the stomach with the tip below the field of view. The heart size and mediastinal contours are within normal limits. Mild diffuse ill-defined interstitial opacity with hazy airspace disease in the right upper lobe. No pleural effusion or pneumothorax. No acute osseous abnormality. IMPRESSION: 1. Appropriately positioned endotracheal tube. Enteric tube  entering the stomach with the tip below the field of view. 2. Hazy diffuse interstitial and right upper lobe airspace disease, favor pulmonary edema. Electronically Signed   By: Titus Dubin M.D.   On: 04/16/2019 13:53      Medications:     Current Medications: . [MAR Hold] fentaNYL (SUBLIMAZE) injection  25 mcg Intravenous Once  . heparin      . [MAR Hold] heparin  4,000 Units Intravenous Once  . midazolam  1 mg Intravenous Once  . pantoprazole (PROTONIX) IV  40 mg Intravenous Daily     Infusions: . sodium chloride 20 mL/hr at 04/16/19 1342  . sodium chloride    . sodium chloride 10 mL/hr (04/16/19 1554)  . bivalirudin (ANGIOMAX) infusion 5 mg/mL Stopped (04/16/19 1637)  . cangrelor 50 mg in NS 250 mL Stopped (04/16/19 1636)  . epinephrine 2 mcg/min (04/16/19 1713)  . fentaNYL infusion INTRAVENOUS 50 mcg/hr (04/16/19 1433)  . midazolam 2 mg/hr (04/16/19 1547)  . norepinephrine (LEVOPHED) Adult infusion 10 mcg/min (04/16/19 1730)  . tirofiban 0.15 mcg/kg/min (04/16/19 1539)     Assessment/Plan   1. Cardiac arrest/VF - due to anterior MI - approximately 20 mins downtime - continue amio - keep K > 4.0 Mg > 2.0 - given hemodynamic instability likely use  normothermia - concern for anoxic brain injjury  2. CAD with acute anterolateral STEMI - Cath 8/24 with 100% prox LAD. Unable to be opened with PCI - He will remain on aggrastat overnight - ASA/Statin - no b-blocker with shock  3. Acute systolic HF with Cardiogenic shock  - due to #2 - Continue Impella support. Currently @ P-9 with 4L flow. Waveforms look good - Continue NE/epi. Add milrinone as needed - diurese  4. Acute hypoxic respiratory failure  - has marked Aa gradient - on vent - CCM consulted. - diurese as tolerated  CRITICAL CARE Performed by: Glori Bickers  Total critical care time: 90 minutes  Critical care time was exclusive of separately billable procedures and treating other patients.  Critical care was necessary to treat or prevent imminent or life-threatening deterioration.  Critical care was time spent personally by me (independent of midlevel providers or residents) on the following activities: development of treatment plan with patient and/or surrogate as well as nursing, discussions with consultants, evaluation of patient's response to treatment, examination of patient, obtaining history from patient or surrogate, ordering and performing treatments and interventions, ordering and review of laboratory studies, ordering and review of radiographic studies, pulse oximetry and re-evaluation of patient's condition.    Length of Stay: 0  Glori Bickers, MD  04/16/2019, 5:39 PM  Advanced Heart Failure Team Pager [phone] (M-F; 7a - 4p)  Please contact [facility] for night-coverage after hours (4p -7a ) and weekends on [email or web address]

## 2019-04-16 NOTE — Progress Notes (Signed)
Transported pt to Cath lab without complications.

## 2019-04-16 NOTE — Progress Notes (Signed)
Pharmacy Antibiotic Note  Lawrence Olson is a 68 y.o. male admitted on 04/16/2019 with sepsis.  Pharmacy has been consulted for vancomycin and cefepime dosing.  WBC 27.5, lactic acid elevated.  Plan: Vancomycin 1250 mg q 24 hrs. Cefepime 2g IV q 8 hrs. Will f/u cultures, renal function and clinical course.  Height: 5\' 6"  (167.6 cm) Weight: 194 lb 14.2 oz (88.4 kg) IBW/kg (Calculated) : 63.8  Temp (24hrs), Avg:97.8 F (36.6 C), Min:95.1 F (35.1 C), Max:98.6 F (37 C)  Recent Labs  Lab 04/16/19 1335 04/16/19 1411 04/16/19 1921 04/16/19 2142 04/16/19 2200  WBC 27.5*  --   --   --   --   CREATININE 1.45* 1.10 1.44* 1.54*  --   LATICACIDVEN  --   --   --   --  6.4*    Estimated Creatinine Clearance: 47.8 mL/min (A) (by C-G formula based on SCr of 1.54 mg/dL (H)).    No Known Allergies   Thank you for allowing pharmacy to be a part of this patient's care.  Marguerite Olea, [facility] Clinical Pharmacist Phone [phone]  04/16/2019 11:01 PM

## 2019-04-16 NOTE — Progress Notes (Signed)
ANTICOAGULATION CONSULT NOTE - Initial Consult  Pharmacy Consult for Heparin  Indication: Impella, ACS  No Known Allergies  Patient Measurements: Height: 5\' 6"  (167.6 cm) Weight: 194 lb 14.2 oz (88.4 kg) IBW/kg (Calculated) : 63.8 Heparin Dosing Weight: 82.3  Vital Signs: Temp: 98.6 F (37 C) (08/24 2149) Temp Source: Core (08/24 2000) BP: 111/82 (08/24 2100) Pulse Rate: 73 (08/24 2149)  Labs: Recent Labs    04/16/19 1335 04/16/19 1411  04/16/19 1701 04/16/19 1850 04/16/19 1921 04/16/19 2102 04/16/19 2142  HGB 13.8 15.0  15.0   < > 13.6 13.3  --  11.6*  --   HCT 42.9 44.0  44.0   < > 40.0 39.0  --  34.0*  --   PLT 331  --   --   --   --   --   --   --   APTT 36  --   --   --   --  52*  --   --   LABPROT 16.3*  --   --   --   --  19.6*  --   --   INR 1.3*  --   --   --   --  1.7*  --   --   CREATININE 1.45* 1.10  --   --   --  1.44*  --  1.54*  TROPONINIHS 226*  --   --   --   --  25,778*  --   --    < > = values in this interval not displayed.    Estimated Creatinine Clearance: 47.8 mL/min (A) (by C-G formula based on SCr of 1.54 mg/dL (H)).   Medical History: No past medical history on file.  Medications:  Infusions:  . sodium chloride 100 mL/hr at 04/16/19 2200  . ceFEPime (MAXIPIME) IV 2 g (04/16/19 2226)  . epinephrine 3 mcg/min (04/16/19 2200)  . fentaNYL infusion INTRAVENOUS 125 mcg/hr (04/16/19 2200)  . impella catheter heparin 50 unit/mL in dextrose 5%    . heparin 100 Units/hr (04/16/19 2200)  . midazolam 4 mg/hr (04/16/19 2200)  . milrinone 0.2 mcg/kg/min (04/16/19 2200)  . norepinephrine (LEVOPHED) Adult infusion 12 mcg/min (04/16/19 2200)  . potassium chloride 10 mEq (04/16/19 2224)  . tirofiban 0.1656 mcg/kg/min (04/16/19 2200)  . vancomycin      Assessment: 68 yo male admitted with STEMI, Impella placed in cath lab.  Heparin currently in purge solution, running at 10 ml/hr = 500 units/hr of heparin.  ACTs have been below goal, and  systemic heparin was added.  Urine dark red, Hgb trending down.    Goal of Therapy:  ACT goal 160-180 Monitor platelets by anticoagulation protocol: Yes   Plan:  Continue heparin in purge solution. RN to titrate systemic heparin per protocol to goal ACT. Monitor for bleeding.  Marguerite Olea, [facility] Clinical Pharmacist Phone [phone]  04/16/2019 10:36 PM

## 2019-04-16 NOTE — Progress Notes (Signed)
  Patient transported to CCU from cath lab earlier this evening.   On arrival, I performed bedside echo with very limited images but Impella appeared to be pulled back 2-3cm. Under echo guidance I advanced catheter further into LV.   Swan numbers performed and CO/CI remained low on NE 10 and epi 2. SVR markedly elevated at 2800. Milrinone 0.2 started with marked improvement in hemodynamics.   Urine output remains brisk at about ~150/hr. Urine mildly bloody.   Left foot without dopplerable pulses but good cap refill and no overt signs ischemia.   D/w CCM. Will aim for normothermia (not hypothermia) given tenuous hemodynamics.  Additional 45 min CCT not including Impella repositioning.   Glori Bickers, MD  10:44 PM

## 2019-04-16 NOTE — Progress Notes (Signed)
NAMEMason Olson, [name]  [id number], DOB:  1951-07-17, LOS: 0 ADMISSION DATE:  04/16/2019, CONSULTATION DATE:  04/16/2019 REFERRING MD:  Claiborne Billings CHIEF COMPLAINT:  STEMI, resp failure  Brief History   68yo M out of hospital cardiac arrest with ROSC, intubated, found to have STEMI, taken emergently to cath lab. Proximal LAD lesion. PCCM consulted for management assistance.   History of present illness   68 yo male unknown past medical history presented to the emergency department via EMS with cardiac arrest.  Per EMR the patient was at work and suddenly collapsed with bystander CPR performed.  EMS arrived and performed approximately 20 minutes of CPR, King airway placed.  Patient was found to be in ventricular fibrillation.  He received 7 epinephrines and multiple shocks as well as 450 mg of amiodarone. ROSC was obtained and patient was transported to the emergency department.  He was intubated in the emergency department and reportedly moving spontaneously with some purposeful movements to pain. He was hypoxic which improved with definitive airway placement and PEEP increased to 10. ABG showed pH of 7.19, PaO2 of 73 PCO2 of 49. He was breathing spontaneously on his own.  Was noted to have anterior STEMI on EKG and taken directly to the Cath Lab.  Patient was found to have significant proximal left anterior descending.  At time of evaluation possible PTCI/Impella placement pending.  PCCM consulted for management assistance.  At time of evaluation complete work-up/labs also still pending  Past Medical History  [place] [facility] Events   8/24: Cath Lab. PTCI unsuccessful despite multiple attempts.  RA 13 RV 42/16 PA 40/26 (31) PCWP 18 Pa sat 50%  Fick 2.7/1.3 CPO 0.6 Impella placed. VT /p same requiring defib.  Consults:  Cardiology PCCM Heart failure   Procedures:  8/24: PTCI unsuccessful 8/14: Impella L groin>> 8/24: 7.5 ETT>> 8/24: Right femoral venous sheath with  PA cath>> 8/24: Right femoral arterial sheath>> 8/24: Left femoral arterial sheath>> 8/24: L IJ CVC   Significant Diagnostic Tests:    Micro Data:  COVID-19 pending  Antimicrobials:  NA  Interim history/subjective:  Cath lab, impella placed, P9.  Came from cath lab on epi, norepi, fentanyl, versed.    Objective   Blood pressure 114/86, pulse 95, temperature (!) 95.1 F (35.1 C), temperature source Tympanic, resp. rate 16, height 5' 6.5" (1.689 m), SpO2 (!) 84 %.    Vent Mode: PRVC FiO2 (%):  [100 %] 100 % Set Rate:  [16 bmp] 16 bmp Vt Set:  [550 mL] 550 mL PEEP:  [5 cmH20-10 cmH20] 10 cmH20 Plateau Pressure:  [21 cmH20] 21 cmH20  No intake or output data in the 24 hours ending 04/16/19 1430 There were no vitals filed for this visit.  Examination: General: Well-developed, well nourished. Critically ill appearing HENT: Normocephalic, Pupils 67mm b/l, non-reactive.. Moist mucus membranes Neck: No JVD. Trachea midline. No thyromegaly, no lymphadenopathy CV: RRR. S1S2. No MRG. +1 distal pulses Lungs: BBS present, rhonchi upper lobes, FNL, symmetrical. Full vent support  KNL:[id number] BS x4. SNT/ND. No masses, guarding or rigidity GU: Foley draining pink tinged urine EXT:  No edema Skin: Pale, cool, dry. Anterior surfaces in tact. No rashes or lesions Neuro: withdraws to deep noxious stimuli in lower EXT briskly. Delayed withdrawal in upper EXT. No corneal reflex.       Resolved Hospital Problem list   N/A  Assessment & Plan:  68 year old male with no known PMH who presents to PCCM in  the cath lab post cardiac arrest while at work with bystander CPR started for a total of 24 minutes and 8 rounds of epi with initial presentation is VF and multiple shocks.  Patient was taken to the cath lab and found to have a proximal LAD lesion and started on levophed for BP support.  Evidently patient was not purposeful but able to localize to pain per EDP but by the time  patient was examined by PCCM patient was paralyzed.  I reviewed CXR myself, ETT is in a good position with severe pulmonary edema.  Discussed with PCCM-NP.  Cardiac arrest:  - TTM 36 degrees orders placed  - Levophed for BP support  - IVF resuscitation  - Tele monitoring  - ASA  - Swan  - Cards to manage  - Angiomax  VDRF:  - Adjust vent for ABG  - Full vent support  - CXR and ABG now and in AM  - VAP prevention  - Titrate O2 for sat of 88-92%  Acute encephalopathy:  - Versed drip  - Fentanyl drip  - Nimbex drip  - Monitor clinically  Acute pulmonary edema:  - Hold lasix for now  Renal:  - BMET now and in AM  - Replace electrolytes as indicated  AM labs ordered.  PCCM will consult for vent management.  Labs   CBC: Recent Labs  Lab 04/16/19 1335  WBC 27.5*  NEUTROABS PENDING  HGB 13.8  HCT 42.9  MCV 100.2*  PLT 956   Basic Metabolic Panel: No results for input(s): NA, K, CL, CO2, GLUCOSE, BUN, CREATININE, CALCIUM, MG, PHOS in the last 168 hours. GFR: CrCl cannot be calculated (No successful lab value found.). Recent Labs  Lab 04/16/19 1335  WBC 27.5*   Liver Function Tests: No results for input(s): AST, ALT, ALKPHOS, BILITOT, PROT, ALBUMIN in the last 168 hours. No results for input(s): LIPASE, AMYLASE in the last 168 hours. No results for input(s): AMMONIA in the last 168 hours.  ABG No results found for: PHART, PCO2ART, PO2ART, HCO3, TCO2, ACIDBASEDEF, O2SAT   Coagulation Profile: Recent Labs  Lab 04/16/19 1335  INR 1.3*   Cardiac Enzymes: No results for input(s): CKTOTAL, CKMB, CKMBINDEX, TROPONINI in the last 168 hours.  HbA1C: No results found for: HGBA1C  CBG: No results for input(s): GLUCAP in the last 168 hours.  Review of Systems:   Unattainable due to cardiac arrest, sedation and intubation  Past Medical History  He,  has no past medical history on file.   Surgical History   No past surgical history on file.   Social  History   reports that he has quit smoking. He has never used smokeless tobacco. He reports current alcohol use.   Family History   His family history is not on file.   Allergies No Known Allergies   Home Medications  Prior to Admission medications   Medication Sig Start Date End Date Taking? Authorizing Provider  fluocinolone (SYNALAR) 0.01 % external solution Apply 1 application topically 2 (two) times daily. For 14 days 03/22/19   [provider]  ketoconazole (NIZORAL) 2 % shampoo Apply 5 mLs topically 3 (three) times a week. 03/17/19   [provider]    The patient is critically ill with multiple organ systems failure and requires high complexity decision making for assessment and support, frequent evaluation and titration of therapies, application of advanced monitoring technologies and extensive interpretation of multiple databases.   Critical Care Time devoted to patient care  services described in this note is  90  Minutes. This time reflects time of care of this signee Dr Jennet Maduro. This critical care time does not reflect procedure time, or teaching time or supervisory time of PA/NP/Med student/Med Resident etc but could involve care discussion time.  Rush Farmer, M.D. [facility] Pulmonary/Critical Care Medicine. Pager: [phone]. After hours pager: [phone].

## 2019-04-16 NOTE — ED Provider Notes (Signed)
[place] EMERGENCY DEPARTMENT Provider Note   CSN: [id number] Arrival date & time: 04/16/19  1324     History   Chief Complaint No chief complaint on file.   HPI Lawrence Olson is a 68 y.o. male.     68 yo M with a chief complaints of cardiac arrest.  The patient was at work and suddenly collapsed.  He had CPR started on him by bystanders.  EMS arrived and performed approximately 20 minutes of CPR.  Patient was found to be in ventricular fibrillation.  Received 7 epinephrines multiple shocks.  450 of amiodarone.  Eventually the patient had ROSC.  Was transported here.  Level 5 caveat unresponsive  The history is provided by the EMS personnel.  Illness Severity:  Severe Onset quality:  Sudden Duration:  1 day   No past medical history on file.  There are no active problems to display for this patient.   No past surgical history on file.      Home Medications    Prior to Admission medications   Medication Sig Start Date End Date Taking? Authorizing Provider  fluocinolone (SYNALAR) 0.01 % external solution Apply 1 application topically 2 (two) times daily. For 14 days 03/22/19   [provider]  ketoconazole (NIZORAL) 2 % shampoo Apply 5 mLs topically 3 (three) times a week. 03/17/19   [provider]    Family History No family history on file.  Social History Social History   Tobacco Use   Smoking status: Former Smoker   Smokeless tobacco: Never Used  Substance Use Topics   Alcohol use: Yes    Comment: rarely   Drug use: Not on file     Allergies   Patient has no known allergies.   Review of Systems Review of Systems  Unable to perform ROS: Patient unresponsive     Physical Exam Updated Vital Signs BP 114/86 (BP Location: Right Arm)    Pulse 95    Temp (!) 95.1 F (35.1 C) (Tympanic)    Resp 16    Ht 5' 6.5" (1.689 m)    SpO2 (!) 84%    BMI 30.78 kg/m   Physical Exam Vitals signs and nursing note  reviewed.  Constitutional:      Appearance: He is well-developed.  HENT:     Head: Normocephalic and atraumatic.  Eyes:     Pupils: Pupils are equal, round, and reactive to light.     Comments: 4 mm and reactive bilaterally  Neck:     Musculoskeletal: Normal range of motion and neck supple.     Vascular: No JVD.  Cardiovascular:     Rate and Rhythm: Normal rate and regular rhythm.     Heart sounds: No murmur. No friction rub. No gallop.   Pulmonary:     Effort: No respiratory distress.     Breath sounds: No wheezing.  Abdominal:     General: There is no distension.     Tenderness: There is no guarding or rebound.  Musculoskeletal: Normal range of motion.  Skin:    Coloration: Skin is not pale.     Findings: No rash.  Neurological:     Mental Status: He is alert.     Comments: Localizes to pain breathes spontaneously.      ED Treatments / Results  Labs (all labs ordered are listed, but only abnormal results are displayed) Labs Reviewed  CBC WITH DIFFERENTIAL/PLATELET - Abnormal; Notable for the following components:  Result Value   WBC 27.5 (*)    MCV 100.2 (*)    All other components within normal limits  SARS CORONAVIRUS 2  PROTIME-INR  APTT  COMPREHENSIVE METABOLIC PANEL  LIPID PANEL  BLOOD GAS, ARTERIAL  TROPONIN I (HIGH SENSITIVITY)    EKG None  Radiology Dg Chest Portable 1 View  Result Date: 04/16/2019 CLINICAL DATA:  Intubation.  Cardiac arrest status post CPR. EXAM: PORTABLE CHEST 1 VIEW COMPARISON:  None. FINDINGS: Endotracheal tube in good position with the tip 4.6 cm above the carina. Enteric tube entering the stomach with the tip below the field of view. The heart size and mediastinal contours are within normal limits. Mild diffuse ill-defined interstitial opacity with hazy airspace disease in the right upper lobe. No pleural effusion or pneumothorax. No acute osseous abnormality. IMPRESSION: 1. Appropriately positioned endotracheal tube. Enteric  tube entering the stomach with the tip below the field of view. 2. Hazy diffuse interstitial and right upper lobe airspace disease, favor pulmonary edema. Electronically Signed   By: Titus Dubin M.D.   On: 04/16/2019 13:53    Procedures Procedure Name: Intubation Date/Time: 04/16/2019 2:00 PM Performed by: Deno Etienne, DO Pre-anesthesia Checklist: Patient identified, Emergency Drugs available and Timeout performed Oxygen Delivery Method: Non-rebreather mask Preoxygenation: Pre-oxygenation with 100% oxygen Induction Type: Rapid sequence Laryngoscope Size: Glidescope Grade View: Grade I Tube size: 7.5 mm Number of attempts: 1 Airway Equipment and Method: Video-laryngoscopy Placement Confirmation: ETT inserted through vocal cords under direct vision,  Breath sounds checked- equal and bilateral and CO2 detector Secured at: 24 cm Tube secured with: ETT holder Dental Injury: Teeth and Oropharynx as per pre-operative assessment  Difficulty Due To: Difficulty was anticipated Future Recommendations: Recommend- induction with short-acting agent, and alternative techniques readily available    NG placement  Date/Time: 04/16/2019 2:09 PM Performed by: Deno Etienne, DO Authorized by: Deno Etienne, DO  Consent: The procedure was performed in an emergent situation. Verbal consent obtained. Imaging studies: imaging studies available Required items: required blood products, implants, devices, and special equipment available Patient identity confirmed: arm band Time out: Immediately prior to procedure a "time out" was called to verify the correct patient, procedure, equipment, support staff and site/side marked as required. Local anesthesia used: no  Anesthesia: Local anesthesia used: no  Sedation: Patient sedated: no  Patient tolerance: patient tolerated the procedure well with no immediate complications    (including critical care time)  Medications Ordered in ED Medications  0.9 %   sodium chloride infusion ( Intravenous New Bag/Given 04/16/19 1342)  heparin injection 60 Units/kg ( Intravenous MAR Hold 04/16/19 1402)  etomidate (AMIDATE) injection (10 mg Intravenous Given 04/16/19 1334)  succinylcholine (ANECTINE) injection (100 mg Intravenous Given 04/16/19 1335)  fentaNYL (SUBLIMAZE) 100 MCG/2ML injection (has no administration in time range)  midazolam (VERSED) 2 MG/2ML injection (has no administration in time range)  fentaNYL (SUBLIMAZE) injection 25 mcg ( Intravenous MAR Hold 04/16/19 1402)  fentaNYL 2519mcg in NS 258mL (20mcg/ml) infusion-PREMIX (50 mcg/hr Intravenous New Bag/Given 04/16/19 1410)  fentaNYL (SUBLIMAZE) bolus via infusion 25 mcg ( Intravenous MAR Hold 04/16/19 1402)  heparin 5000 UNIT/ML injection (has no administration in time range)  midazolam (VERSED) injection 1 mg ( Intravenous MAR Hold 04/16/19 1402)  midazolam (VERSED) injection 1 mg ( Intravenous MAR Hold 04/16/19 1402)  lidocaine (PF) (XYLOCAINE) 1 % injection (16 mLs Infiltration Given 04/16/19 1406)  aspirin suppository 300 mg (300 mg Rectal Given 04/16/19 1346)  lidocaine (PF) (XYLOCAINE) 1 % injection (has  no administration in time range)  Heparin (Porcine) in NaCl 1000-0.9 UT/500ML-% SOLN (has no administration in time range)     Initial Impression / Assessment and Plan / ED Course  I have reviewed the triage vital signs and the nursing notes.  Pertinent labs & imaging results that were available during my care of the patient were reviewed by me and considered in my medical decision making (see chart for details).        68 yo M here post CPR. Shock resistant ventricular fibrillation.  Patient received multiple shocks and 7 epinephrines and 450 of amiodarone.  Had return of spontaneous circulation.  The patient upon arrival had some purposeful movements to pain.  Was breathing spontaneously on his own.  He had a STEMI on EKG and initially was going to go straight to the Cath Lab.  Stopped in  the ED to be intubated.  Had a prolonged period of hypoxia that improved with increased PEEP and FiO2.  The family had arrived and provided further history.  His brother was working with him today and said that he had had no complaints but suddenly collapsed.  Said his eyes rolled back in his head.  He called 911 and tried to put cool water on him without improvement.  He estimates that firefighters arrived within about 10 minutes and started CPR.  He states that his brother has no medical problems is not on any medications has no allergies.  CRITICAL CARE Performed by: Cecilio Asper   Total critical care time: 35 minutes  Critical care time was exclusive of separately billable procedures and treating other patients.  Critical care was necessary to treat or prevent imminent or life-threatening deterioration.  Critical care was time spent personally by me on the following activities: development of treatment plan with patient and/or surrogate as well as nursing, discussions with consultants, evaluation of patient's response to treatment, examination of patient, obtaining history from patient or surrogate, ordering and performing treatments and interventions, ordering and review of laboratory studies, ordering and review of radiographic studies, pulse oximetry and re-evaluation of patient's condition.  The patients results and plan were reviewed and discussed.   Any x-rays performed were independently reviewed by myself.   Differential diagnosis were considered with the presenting HPI.  Medications  0.9 %  sodium chloride infusion ( Intravenous New Bag/Given 04/16/19 1342)  heparin injection 60 Units/kg ( Intravenous MAR Hold 04/16/19 1402)  etomidate (AMIDATE) injection (10 mg Intravenous Given 04/16/19 1334)  succinylcholine (ANECTINE) injection (100 mg Intravenous Given 04/16/19 1335)  fentaNYL (SUBLIMAZE) 100 MCG/2ML injection (has no administration in time range)  midazolam (VERSED)  2 MG/2ML injection (has no administration in time range)  fentaNYL (SUBLIMAZE) injection 25 mcg ( Intravenous MAR Hold 04/16/19 1402)  fentaNYL 2541mcg in NS 271mL (78mcg/ml) infusion-PREMIX (50 mcg/hr Intravenous New Bag/Given 04/16/19 1410)  fentaNYL (SUBLIMAZE) bolus via infusion 25 mcg ( Intravenous MAR Hold 04/16/19 1402)  heparin 5000 UNIT/ML injection (has no administration in time range)  midazolam (VERSED) injection 1 mg ( Intravenous MAR Hold 04/16/19 1402)  midazolam (VERSED) injection 1 mg ( Intravenous MAR Hold 04/16/19 1402)  lidocaine (PF) (XYLOCAINE) 1 % injection (16 mLs Infiltration Given 04/16/19 1406)  aspirin suppository 300 mg (300 mg Rectal Given 04/16/19 1346)  lidocaine (PF) (XYLOCAINE) 1 % injection (has no administration in time range)  Heparin (Porcine) in NaCl 1000-0.9 UT/500ML-% SOLN (has no administration in time range)    Vitals:   04/16/19 1335 04/16/19 1347 04/16/19  1349  BP:  114/86   Pulse:  95   Resp:  16   Temp:   (!) 95.1 F (35.1 C)  TempSrc:   Tympanic  SpO2:  (!) 84%   Height: 5' 6.5" (1.689 m)      Final diagnoses:  ST elevation myocardial infarction (STEMI), unspecified artery ([place])    Admission/ observation were discussed with the admitting physician, patient and/or family and they are comfortable with the plan.   Final Clinical Impressions(s) / ED Diagnoses   Final diagnoses:  ST elevation myocardial infarction (STEMI), unspecified artery [facility])    ED Discharge Orders    None       Deno Etienne, DO 04/16/19 1410

## 2019-04-17 ENCOUNTER — Inpatient Hospital Stay (HOSPITAL_COMMUNITY): Payer: PPO

## 2019-04-17 ENCOUNTER — Encounter (HOSPITAL_COMMUNITY): Payer: Self-pay | Admitting: Cardiovascular Disease

## 2019-04-17 DIAGNOSIS — I5021 Acute systolic (congestive) heart failure: Secondary | ICD-10-CM

## 2019-04-17 LAB — POCT I-STAT EG7
Acid-base deficit: 5 mmol/L — ABNORMAL HIGH (ref 0.0–2.0)
Bicarbonate: 20.7 mmol/L (ref 20.0–28.0)
Calcium, Ion: 1.08 mmol/L — ABNORMAL LOW (ref 1.15–1.40)
HCT: 32 % — ABNORMAL LOW (ref 39.0–52.0)
Hemoglobin: 10.9 g/dL — ABNORMAL LOW (ref 13.0–17.0)
O2 Saturation: 96 %
Patient temperature: 38.4
Potassium: 4.1 mmol/L (ref 3.5–5.1)
Sodium: 145 mmol/L (ref 135–145)
TCO2: 22 mmol/L (ref 22–32)
pCO2, Ven: 42.8 mmHg — ABNORMAL LOW (ref 44.0–60.0)
pH, Ven: 7.299 (ref 7.250–7.430)
pO2, Ven: 99 mmHg — ABNORMAL HIGH (ref 32.0–45.0)

## 2019-04-17 LAB — POCT ACTIVATED CLOTTING TIME
Activated Clotting Time: 131 seconds
Activated Clotting Time: 136 seconds
Activated Clotting Time: 136 seconds
Activated Clotting Time: 136 seconds
Activated Clotting Time: 142 seconds
Activated Clotting Time: 142 seconds
Activated Clotting Time: 142 seconds
Activated Clotting Time: 147 seconds
Activated Clotting Time: 147 seconds
Activated Clotting Time: 153 seconds
Activated Clotting Time: 164 seconds
Activated Clotting Time: 164 seconds
Activated Clotting Time: 164 seconds
Activated Clotting Time: 169 seconds
Activated Clotting Time: 175 seconds
Activated Clotting Time: 169 seconds

## 2019-04-17 LAB — CBC
HCT: 33.2 % — ABNORMAL LOW (ref 39.0–52.0)
Hemoglobin: 11 g/dL — ABNORMAL LOW (ref 13.0–17.0)
MCH: 32.1 pg (ref 26.0–34.0)
MCHC: 33.1 g/dL (ref 30.0–36.0)
MCV: 96.8 fL (ref 80.0–100.0)
Platelets: 377 10*3/uL (ref 150–400)
RBC: 3.43 MIL/uL — ABNORMAL LOW (ref 4.22–5.81)
RDW: 12.9 % (ref 11.5–15.5)
WBC: 25.1 10*3/uL — ABNORMAL HIGH (ref 4.0–10.5)
nRBC: 0 % (ref 0.0–0.2)

## 2019-04-17 LAB — COOXEMETRY PANEL
Carboxyhemoglobin: 1.1 % (ref 0.5–1.5)
Methemoglobin: 1.4 % (ref 0.0–1.5)
O2 Saturation: 74.1 %
Total hemoglobin: 11.1 g/dL — ABNORMAL LOW (ref 12.0–16.0)

## 2019-04-17 LAB — LACTATE DEHYDROGENASE: LDH: 1260 U/L — ABNORMAL HIGH (ref 98–192)

## 2019-04-17 LAB — ECHOCARDIOGRAM COMPLETE
Height: 66 in
Weight: 3220.48 oz

## 2019-04-17 LAB — GLUCOSE, CAPILLARY
Glucose-Capillary: 255 mg/dL — ABNORMAL HIGH (ref 70–99)
Glucose-Capillary: 258 mg/dL — ABNORMAL HIGH (ref 70–99)
Glucose-Capillary: 195 mg/dL — ABNORMAL HIGH (ref 70–99)
Glucose-Capillary: 257 mg/dL — ABNORMAL HIGH (ref 70–99)
Glucose-Capillary: 248 mg/dL — ABNORMAL HIGH (ref 70–99)
Glucose-Capillary: 219 mg/dL — ABNORMAL HIGH (ref 70–99)
Glucose-Capillary: 202 mg/dL — ABNORMAL HIGH (ref 70–99)
Glucose-Capillary: 174 mg/dL — ABNORMAL HIGH (ref 70–99)
Glucose-Capillary: 163 mg/dL — ABNORMAL HIGH (ref 70–99)
Glucose-Capillary: 154 mg/dL — ABNORMAL HIGH (ref 70–99)
Glucose-Capillary: 143 mg/dL — ABNORMAL HIGH (ref 70–99)
Glucose-Capillary: 150 mg/dL — ABNORMAL HIGH (ref 70–99)
Glucose-Capillary: 77 mg/dL (ref 70–99)
Glucose-Capillary: 130 mg/dL — ABNORMAL HIGH (ref 70–99)
Glucose-Capillary: 180 mg/dL — ABNORMAL HIGH (ref 70–99)
Glucose-Capillary: 158 mg/dL — ABNORMAL HIGH (ref 70–99)
Glucose-Capillary: 161 mg/dL — ABNORMAL HIGH (ref 70–99)
Glucose-Capillary: 153 mg/dL — ABNORMAL HIGH (ref 70–99)
Glucose-Capillary: 138 mg/dL — ABNORMAL HIGH (ref 70–99)
Glucose-Capillary: 142 mg/dL — ABNORMAL HIGH (ref 70–99)
Glucose-Capillary: 180 mg/dL — ABNORMAL HIGH (ref 70–99)

## 2019-04-17 LAB — BASIC METABOLIC PANEL
Anion gap: 11 (ref 5–15)
Anion gap: 9 (ref 5–15)
BUN: 27 mg/dL — ABNORMAL HIGH (ref 8–23)
BUN: 32 mg/dL — ABNORMAL HIGH (ref 8–23)
CO2: 19 mmol/L — ABNORMAL LOW (ref 22–32)
CO2: 21 mmol/L — ABNORMAL LOW (ref 22–32)
Calcium: 7.3 mg/dL — ABNORMAL LOW (ref 8.9–10.3)
Calcium: 7.5 mg/dL — ABNORMAL LOW (ref 8.9–10.3)
Chloride: 110 mmol/L (ref 98–111)
Chloride: 112 mmol/L — ABNORMAL HIGH (ref 98–111)
Creatinine, Ser: 1.8 mg/dL — ABNORMAL HIGH (ref 0.61–1.24)
Creatinine, Ser: 2.06 mg/dL — ABNORMAL HIGH (ref 0.61–1.24)
GFR calc Af Amer: 44 mL/min — ABNORMAL LOW (ref >60)
GFR calc Af Amer: 37 mL/min — ABNORMAL LOW (ref >60)
GFR calc non Af Amer: 38 mL/min — ABNORMAL LOW (ref >60)
GFR calc non Af Amer: 32 mL/min — ABNORMAL LOW (ref >60)
Glucose, Bld: 251 mg/dL — ABNORMAL HIGH (ref 70–99)
Glucose, Bld: 143 mg/dL — ABNORMAL HIGH (ref 70–99)
Potassium: 4 mmol/L (ref 3.5–5.1)
Potassium: 3.9 mmol/L (ref 3.5–5.1)
Sodium: 140 mmol/L (ref 135–145)
Sodium: 142 mmol/L (ref 135–145)

## 2019-04-17 LAB — POCT I-STAT, CHEM 8
BUN: 34 mg/dL — ABNORMAL HIGH (ref 8–23)
Calcium, Ion: 1.12 mmol/L — ABNORMAL LOW (ref 1.15–1.40)
Chloride: 109 mmol/L (ref 98–111)
Creatinine, Ser: 1.9 mg/dL — ABNORMAL HIGH (ref 0.61–1.24)
Glucose, Bld: 136 mg/dL — ABNORMAL HIGH (ref 70–99)
HCT: 27 % — ABNORMAL LOW (ref 39.0–52.0)
Hemoglobin: 9.2 g/dL — ABNORMAL LOW (ref 13.0–17.0)
Potassium: 3.9 mmol/L (ref 3.5–5.1)
Sodium: 146 mmol/L — ABNORMAL HIGH (ref 135–145)
TCO2: 21 mmol/L — ABNORMAL LOW (ref 22–32)

## 2019-04-17 LAB — PHOSPHORUS: Phosphorus: 1.3 mg/dL — ABNORMAL LOW (ref 2.5–4.6)

## 2019-04-17 LAB — LACTIC ACID, PLASMA
Lactic Acid, Venous: 6.8 mmol/L (ref 0.5–1.9)
Lactic Acid, Venous: 4.5 mmol/L (ref 0.5–1.9)

## 2019-04-17 LAB — MAGNESIUM
Magnesium: 2 mg/dL (ref 1.7–2.4)
Magnesium: 1.9 mg/dL (ref 1.7–2.4)

## 2019-04-17 MED ORDER — AMIODARONE HCL IN DEXTROSE 360-4.14 MG/200ML-% IV SOLN
INTRAVENOUS | Status: AC
  Administered 2019-04-17: 60 mg/h via INTRAVENOUS
  Filled 2019-04-17: qty 200

## 2019-04-17 MED ORDER — CHLORHEXIDINE GLUCONATE CLOTH 2 % EX PADS
6.0000 | MEDICATED_PAD | Freq: Every day | CUTANEOUS | Status: DC
  Administered 2019-04-17 – 2019-04-18 (×2): 6 via TOPICAL

## 2019-04-17 MED ORDER — VITAL AF 1.2 CAL PO LIQD
1000.0000 mL | ORAL | Status: DC
  Administered 2019-04-17: 17:00:00 1000 mL

## 2019-04-17 MED ORDER — MUPIROCIN 2 % EX OINT
1.0000 "application " | TOPICAL_OINTMENT | Freq: Two times a day (BID) | CUTANEOUS | Status: AC
  Administered 2019-04-17 – 2019-04-21 (×10): 1 via NASAL
  Filled 2019-04-17 (×2): qty 22

## 2019-04-17 MED ORDER — PRO-STAT SUGAR FREE PO LIQD
30.0000 mL | Freq: Two times a day (BID) | ORAL | Status: DC
  Administered 2019-04-17 – 2019-05-01 (×29): 30 mL
  Filled 2019-04-17 (×29): qty 30

## 2019-04-17 MED ORDER — VANCOMYCIN HCL IN DEXTROSE 750-5 MG/150ML-% IV SOLN
750.0000 mg | INTRAVENOUS | Status: DC
  Administered 2019-04-17: 750 mg via INTRAVENOUS
  Filled 2019-04-17: qty 150

## 2019-04-17 MED ORDER — AMIODARONE LOAD VIA INFUSION
150.0000 mg | Freq: Once | INTRAVENOUS | Status: AC
  Administered 2019-04-17: 150 mg via INTRAVENOUS
  Filled 2019-04-17: qty 83.34

## 2019-04-17 MED ORDER — AMIODARONE HCL IN DEXTROSE 360-4.14 MG/200ML-% IV SOLN
60.0000 mg/h | INTRAVENOUS | Status: DC
  Administered 2019-04-17 (×2): 60 mg/h via INTRAVENOUS

## 2019-04-17 MED ORDER — AMIODARONE HCL IN DEXTROSE 360-4.14 MG/200ML-% IV SOLN
30.0000 mg/h | INTRAVENOUS | Status: DC
  Administered 2019-04-17 – 2019-04-27 (×21): 30 mg/h via INTRAVENOUS
  Filled 2019-04-17 (×21): qty 200

## 2019-04-17 MED ORDER — CALCIUM GLUCONATE-NACL 2-0.675 GM/100ML-% IV SOLN
2.0000 g | Freq: Once | INTRAVENOUS | Status: AC
  Administered 2019-04-17: 08:00:00 2000 mg via INTRAVENOUS
  Filled 2019-04-17: qty 100

## 2019-04-17 MED ORDER — NOREPINEPHRINE 16 MG/250ML-% IV SOLN
0.0000 ug/min | INTRAVENOUS | Status: DC
  Administered 2019-04-17: 07:00:00 38 ug/min via INTRAVENOUS
  Administered 2019-04-17: 19:00:00 16 ug/min via INTRAVENOUS
  Administered 2019-04-18: 23:00:00 13 ug/min via INTRAVENOUS
  Administered 2019-04-19: 5 ug/min via INTRAVENOUS
  Administered 2019-04-22: 03:00:00 3 ug/min via INTRAVENOUS
  Filled 2019-04-17 (×5): qty 250

## 2019-04-17 MED ORDER — SODIUM PHOSPHATES 45 MMOLE/15ML IV SOLN
20.0000 mmol | Freq: Once | INTRAVENOUS | Status: AC
  Administered 2019-04-17: 09:00:00 20 mmol via INTRAVENOUS
  Filled 2019-04-17: qty 6.67

## 2019-04-17 MED ORDER — AMIODARONE IV BOLUS ONLY 150 MG/100ML: 150.0000 mg | Freq: Once | INTRAVENOUS | Status: DC

## 2019-04-17 MED ORDER — SODIUM CHLORIDE 0.9 % IV BOLUS
250.0000 mL | Freq: Once | INTRAVENOUS | Status: AC
  Administered 2019-04-17: 08:00:00 250 mL via INTRAVENOUS

## 2019-04-17 MED ORDER — SODIUM CHLORIDE 0.9 % IV SOLN
2.0000 g | Freq: Two times a day (BID) | INTRAVENOUS | Status: DC
  Administered 2019-04-17 – 2019-04-18 (×2): 2 g via INTRAVENOUS
  Filled 2019-04-17 (×3): qty 2

## 2019-04-17 MED ORDER — SODIUM CHLORIDE 0.9 % IV SOLN
INTRAVENOUS | Status: DC
  Administered 2019-04-19 (×2): via INTRAVENOUS

## 2019-04-17 MED ORDER — HEPARIN (PORCINE) 25000 UT/250ML-% IV SOLN
200.0000 [IU]/h | INTRAVENOUS | Status: DC
  Administered 2019-04-18: 1000 [IU]/h via INTRAVENOUS
  Administered 2019-04-19 – 2019-04-20 (×4): 1400 [IU]/h via INTRAVENOUS
  Filled 2019-04-17 (×4): qty 250

## 2019-04-17 NOTE — Progress Notes (Signed)
  On wake-up assessment today followed commands.   Had increasing ectopy today and we started on amio with good response.   Remains on impella at p-9. NE down from 38 to 24. Remains on epi 5.   Flows good. CI ~ 2.8.  Urine clearing.   Will stop aggrastat now 24 hours out. Continue heparin.   Will not load Brillinta until long-term plan more clear (? Repeat attempt at PCI of LAD vs CABG vs need for VAD).   We can define as we go.  I placed sutures around R femoral arterial and venous lines due to consistent oozing.   D/w Dr. Burt Knack and PharmD.   Additional CCT 35 mins   Glori Bickers, MD  4:14 PM

## 2019-04-17 NOTE — Progress Notes (Addendum)
Pharmacy Antibiotic Note  Lawrence Olson is a 68 y.o. male admitted on 04/16/2019 with sepsis.  Pharmacy has been consulted for vancomycin and cefepime dosing.  Renal function decline from yesterday, SCr now 1.8 with est CrCl ~ 40 mL/min. Current vancomycin dosing now with estimated AUC of 809, above therapeutic goal.  Plan: Reduce vancomycin to 750 mg q 24 hrs. Goal AUC 400-550. Expected AUC: 485 SCr used: 1.8 Reduce cefepime to 2g IV q12 hrs. Will f/u cultures, renal function and clinical course.  Height: 5\' 6"  (167.6 cm) Weight: 201 lb 4.5 oz (91.3 kg) IBW/kg (Calculated) : 63.8  Temp (24hrs), Avg:99.2 F (37.3 C), Min:95.1 F (35.1 C), Max:101.1 F (38.4 C)  Recent Labs  Lab 04/16/19 1335 04/16/19 1411 04/16/19 1921 04/16/19 2142 04/16/19 2200 04/17/19 0103 04/17/19 0547  WBC 27.5*  --   --   --   --   --  25.1*  CREATININE 1.45* 1.10 1.44* 1.54*  --   --  1.80*  LATICACIDVEN  --   --   --   --  6.4* 6.8* 4.5*    Estimated Creatinine Clearance: 41.6 mL/min (A) (by C-G formula based on SCr of 1.8 mg/dL (H)).    No Known Allergies  Antimicrobials this admission: Vancomycin 8/24 >>  Cefepime 8/24 >>  Dose adjustments this admission: 8/25: vancomycin 1250 mg >> 750 mg q24h 8/25: cefepime 2 g q8h >> q12h  Microbiology results: 8/24 BCx:  8/24 COVID: neg /24 MRSA: positive  Thank you for allowing pharmacy to be a part of this patient's care.  Vertis Kelch, PharmD PGY2 Cardiology Pharmacy Resident Phone [phone] 04/17/2019       10:36 AM  Please check [email or web address] for unit-specific pharmacist phone numbers

## 2019-04-17 NOTE — Progress Notes (Signed)
CCM NP placed orders for normothermia d/t patient spiking temperature overnight. NP stated we may maintain normothermia through cooling blanket rather than using the [facility]. RN will execute orders and continue to monitor closely.

## 2019-04-17 NOTE — Progress Notes (Signed)
NAMEChristo Olson, [name]  [id number], DOB:  May 20, 1951, LOS: 0 ADMISSION DATE:  04/16/2019, CONSULTATION DATE:  04/16/2019 REFERRING MD:  Claiborne Billings CHIEF COMPLAINT:  STEMI, resp failure  Brief History   68yo M out of hospital cardiac arrest with ROSC, intubated, found to have STEMI, taken emergently to cath lab. Proximal LAD lesion. PCCM consulted for management assistance.   History of present illness   68 yo male unknown past medical history presented to the emergency department via EMS with cardiac arrest.  Per EMR the patient was at work and suddenly collapsed with bystander CPR performed.  EMS arrived and performed approximately 20 minutes of CPR, King airway placed.  Patient was found to be in ventricular fibrillation.  He received 7 epinephrines and multiple shocks as well as 450 mg of amiodarone. ROSC was obtained and patient was transported to the emergency department.  He was intubated in the emergency department and reportedly moving spontaneously with some purposeful movements to pain. He was hypoxic which improved with definitive airway placement and PEEP increased to 10. ABG showed pH of 7.19, PaO2 of 73 PCO2 of 49. He was breathing spontaneously on his own.  Was noted to have anterior STEMI on EKG and taken directly to the Cath Lab.  Patient was found to have significant proximal left anterior descending.  At time of evaluation possible PTCI/Impella placement pending.  PCCM consulted for management assistance.  At time of evaluation complete work-up/labs also still pending  Past Medical History  [facility] Events   8/24: Cath Lab. PTCI unsuccessful despite multiple attempts.  RA 13 RV 42/16 PA 40/26 (31) PCWP 18 Pa sat 50%  Fick 2.7/1.3 CPO 0.6 Impella placed. VT /p same requiring defib.  Consults:  Cardiology PCCM Heart failure   Procedures:  8/24: PTCI unsuccessful 8/14: Impella L groin>> 8/24: 7.5 ETT>> 8/24: Right femoral venous sheath with  PA cath>> 8/24: Right femoral arterial sheath>> 8/24: Left femoral arterial sheath>> 8/24: L IJ CVC >>>  Significant Diagnostic Tests:    Micro Data:  COVID-19 pending  Antimicrobials:  NA  Interim history/subjective:  impella in place - P9  Some PVC's  Remains on epi gtt, norepi, milrinone Weaning pressors slowly  Febrile overnight  CVP 5-6  Objective    Today's Vitals   04/17/19 0600 04/17/19 0700 04/17/19 0734 04/17/19 0800  BP: 106/89 105/66 95/60 108/75  Pulse: (!) 111 (!) 112 (!) 110 (!) 113  Resp: (!) 21 (!) 21 (!) 21 (!) 21  Temp: (!) 101.1 F (38.4 C) (!) 100.9 F (38.3 C)  (!) 100.6 F (38.1 C)  TempSrc:    Core  SpO2: 96% 95% 96% 96%  Weight:      Height:      PainSc:       Vent Mode: PRVC FiO2 (%):  [40 %-100 %] 40 % Set Rate:  [16 bmp-21 bmp] 21 bmp Vt Set:  [510 mL-550 mL] 510 mL PEEP:  [5 cmH20-10 cmH20] 10 cmH20 Plateau Pressure:  [16 cmH20-23 cmH20] 22 cmH20   Examination:  General:  Acutely ill appearing male, NAD sedated on vent  HEENT: MM pink/moist, ETT, L IJ CVL oozing Neuro: sedated on vent, RASS -3, reportedly purposeful post arrest CV: s1s2 rrr, no m/r/g, NSR with PVC's  PULM:  resps even non labored on vent, few scattered wheezes  GI: soft, hypoactive bs  Extremities: warm/dry,  No edema, groin lines intact, c/d Skin: no rashes or lesions    Resolved  Hospital Problem list   N/A  Assessment & Plan:  68 year old male with no known PMH who presents to PCCM in the cath lab post cardiac arrest while at work with bystander CPR started for a total of 24 minutes and 8 rounds of epi with initial presentation is VF and multiple shocks.  Patient was taken to the cath lab and found to have 100% proximal LAD lesion. PCI unsuccessful so impella placed.    Cardiac arrest/ VF- r/t anterior MI  CAD with acute STEMI - 100% prox LAD - PCI unsuccessful  - cardiology managing  - continue amio gtt  - monitor electrolytes - f/u today at  1pm  - normothermia  -ASA, statin  - plans for return to cath lab in next 24-48hrs    Acute hypoxic respiratory failure - r/t MI, cardiac arrest, cardiogenic shock -Vent support - 8cc/kg  -F/u CXR  -F/u ABG - no weaning until hemodynamically stable  -VAP prevention  - Titrate O2 for sat of 70-48%  Acute systolic heart failure  Cardiogenic shock  - continue pressors - wean as able  - impella support - P9  - milrinone  - monitor CVP  - cards managing   Fever  - normothermia protocol  - continue empiric abx  - follow culture data   Acute encephalopathy - post cardiac arrest. No hypothermia protocol given reported purposeful movement post arrest  - sedation protocol  - no WUA today  - normothermia protocol as above     Labs   CBC: Recent Labs  Lab 04/16/19 1335  04/16/19 1701 04/16/19 1850 04/16/19 2102 04/17/19 0547 04/17/19 0620  WBC 27.5*  --   --   --   --  25.1*  --   NEUTROABS 19.3*  --   --   --   --   --   --   HGB 13.8   < > 13.6 13.3 11.6* 11.0* 10.9*  HCT 42.9   < > 40.0 39.0 34.0* 33.2* 32.0*  MCV 100.2*  --   --   --   --  96.8  --   PLT 331  --   --   --   --  377  --    < > = values in this interval not displayed.   Basic Metabolic Panel: Recent Labs  Lab 04/16/19 1335 04/16/19 1411  04/16/19 1921 04/16/19 2102 04/16/19 2142 04/17/19 0547 04/17/19 0620  NA 140 140  140   < > 141 145 140 140 145  K 3.6 3.7  3.7   < > 3.4* 3.4* 3.5 4.0 4.1  CL 104 104  --  105  --  107 110  --   CO2 18*  --   --  24  --  20* 19*  --   GLUCOSE 316* 310*  --  278*  --  268* 251*  --   BUN 20 24*  --  22  --  23 27*  --   CREATININE 1.45* 1.10  --  1.44*  --  1.54* 1.80*  --   CALCIUM 8.2*  --   --  6.9*  --  7.4* 7.3*  --   MG  --   --   --   --   --   --  2.0  --   PHOS  --   --   --   --   --   --  1.3*  --    < > =  values in this interval not displayed.   GFR: Estimated Creatinine Clearance: 41.6 mL/min (A) (by C-G formula based on SCr of 1.8  mg/dL (H)). Recent Labs  Lab 04/16/19 1335 04/16/19 2200 04/17/19 0103 04/17/19 0547  WBC 27.5*  --   --  25.1*  LATICACIDVEN  --  6.4* 6.8* 4.5*   Liver Function Tests: Recent Labs  Lab 04/16/19 1335  AST 166*  ALT 211*  ALKPHOS 83  BILITOT 0.8  PROT 6.3*  ALBUMIN 3.5   No results for input(s): LIPASE, AMYLASE in the last 168 hours. No results for input(s): AMMONIA in the last 168 hours.  ABG    Component Value Date/Time   PHART 7.305 (L) 04/16/2019 2102   PCO2ART 47.7 04/16/2019 2102   PO2ART 279.0 (H) 04/16/2019 2102   HCO3 20.7 04/17/2019 0620   TCO2 22 04/17/2019 0620   ACIDBASEDEF 5.0 (H) 04/17/2019 0620   O2SAT 96.0 04/17/2019 0620     Coagulation Profile: Recent Labs  Lab 04/16/19 1335 04/16/19 1921  INR 1.3* 1.7*   Cardiac Enzymes: No results for input(s): CKTOTAL, CKMB, CKMBINDEX, TROPONINI in the last 168 hours.  HbA1C: No results found for: HGBA1C  CBG: Recent Labs  Lab 04/17/19 0455 04/17/19 0545 04/17/19 0653 04/17/19 0757 04/17/19 0855  GLUCAP 248* 219* 202* 174* 163*     The patient is critically ill with multiple organ systems failure and requires high complexity decision making for assessment and support, frequent evaluation and titration of therapies, application of advanced monitoring technologies and extensive interpretation of multiple databases.   Critical Care Time devoted to patient care services described in this note is 38  Minutes.    Nickolas Madrid, NP 04/17/2019  10:04 AM Pager: [phone] or [phone]

## 2019-04-17 NOTE — Progress Notes (Signed)
Inpatient Diabetes Program Recommendations  AACE/ADA: New Consensus Statement on Inpatient Glycemic Control (2015)  Target Ranges:  Prepandial:   less than 140 mg/dL      Peak postprandial:   less than 180 mg/dL (1-2 hours)      Critically ill patients:  140 - 180 mg/dL   Lab Results  Component Value Date   GLUCAP 163 (H) 04/17/2019    Review of Glycemic Control  Diabetes history: Unsure  Current orders for Inpatient glycemic control:  IV insulin gtt  Inpatient Diabetes Program Recommendations:    Patient requiring at least 7 units/hour on IV insulin. Agree with IV insulin for now.  Consider an A1c level.  Thanks,  Tama Headings RN, MSN, BC-ADM Inpatient Diabetes Coordinator Team Pager [phone] (8a-5p)

## 2019-04-17 NOTE — Progress Notes (Addendum)
Advanced Heart Failure Team  Note   Primary Physician: Patient, No Pcp Per PCP-Cardiologist:  No primary care provider on file.  Reason for Consultation: Cardiogenic shock  HPI:    Sedated/Intubated  Currently on Epi 5 mcg , NE 38 mcg , Milrinone 0.25 mcg.   Impella P9 3.6 liters Swan #s  RA 5 PA 31/16 Fick 6.3/ 3.2  SVR 820 CPO 0.9 Co-ox 74  Review of Systems: Unavailable as patient intubated/sedated  Home Medications Prior to Admission medications   Medication Sig Start Date End Date Taking? Authorizing Provider  fluocinolone (SYNALAR) 0.01 % external solution Apply 1 application topically 2 (two) times daily. For 14 days 03/22/19   [provider]  ketoconazole (NIZORAL) 2 % shampoo Apply 5 mLs topically 3 (three) times a week. 03/17/19   [provider]    Past Medical History: No past medical history on file. - Unavailable as patient intubated/sedated  Past Surgical History: Past Surgical History:  Procedure Laterality Date  . RIGHT HEART CATH AND CORONARY ANGIOGRAPHY N/A 04/16/2019   Procedure: RIGHT HEART CATH AND CORONARY ANGIOGRAPHY;  Surgeon: Troy Sine, MD;  Location: [facility];  Service: Cardiovascular;  Laterality: N/A;  . VENTRICULAR ASSIST DEVICE INSERTION N/A 04/16/2019   Procedure: VENTRICULAR ASSIST DEVICE INSERTION;  Surgeon: Troy Sine, MD;  Location: [facility];  Service: Cardiovascular;  Laterality: N/A;   Unavailable as patient intubated/sedated  Family History: No family history on file. Unavailable as patient intubated/sedated  Social History: Social History   Socioeconomic History  . Marital status: Married    Spouse name: Not on file  . Number of children: Not on file  . Years of education: Not on file  . Highest education level: Not on file  Occupational History  . Not on file  Social Needs  . Financial resource strain: Not on file  . Food insecurity    Worry: Not on file   Inability: Not on file  . Transportation needs    Medical: Not on file    Non-medical: Not on file  Tobacco Use  . Smoking status: Former Research scientist (life sciences)  . Smokeless tobacco: Never Used  Substance and Sexual Activity  . Alcohol use: Yes    Comment: rarely  . Drug use: Not on file  . Sexual activity: Not on file  Lifestyle  . Physical activity    Days per week: Not on file    Minutes per session: Not on file  . Stress: Not on file  Relationships  . Social Herbalist on phone: Not on file    Gets together: Not on file    Attends religious service: Not on file    Active member of club or organization: Not on file    Attends meetings of clubs or organizations: Not on file    Relationship status: Not on file  Other Topics Concern  . Not on file  Social History Narrative  . Not on file    Allergies:  No Known Allergies  Objective:    Vital Signs:   Temp:  [95.1 F (35.1 C)-101.1 F (38.4 C)] 100.9 F (38.3 C) (08/25 0700) Pulse Rate:  [0-119] 112 (08/25 0700) Resp:  [0-31] 21 (08/25 0700) BP: (82-226)/(57-143) 105/66 (08/25 0700) SpO2:  [0 %-100 %] 95 % (08/25 0700) Arterial Line BP: (81-148)/(51-92) 97/60 (08/25 0700) FiO2 (%):  [40 %-100 %] 40 % (08/25 0400) Weight:  [88.4 kg-91.3 kg] 91.3 kg (08/25  0500) Last BM Date: (PTA)  Weight change: Filed Weights   04/16/19 1805 04/17/19 0500  Weight: 88.4 kg 91.3 kg    Intake/Output:   Intake/Output Summary (Last 24 hours) at 04/17/2019 0723 Last data filed at 04/17/2019 0700 Gross per 24 hour  Intake 3590.54 ml  Output 1892 ml  Net 1698.54 ml      Physical Exam   CVP 5-6  General: intubated  HEENT: ETTl. LIJ oozing  Neck: supple. no JVD. Carotids 2+ bilat; no bruits. No lymphadenopathy or thryomegaly appreciated. Cor: PMI nondisplaced. Regular rate & rhythm. No rubs, gallops or murmurs. Lungs: clear Abdomen: soft, nontender, nondistended. No hepatosplenomegaly. No bruits or masses. Good bowel sounds.  Extremities: no cyanosis, clubbing, rash, edema. R femoral impella/small amount of oozing. Able to doppler pulses.  Neuro: intubated/sedated  GU: Foley hematuria    Telemetry  ST 110s  Personally reviewed    EKG    Sinus with marked anterolateral ST elevation  Labs   Basic Metabolic Panel: Recent Labs  Lab 04/16/19 1335 04/16/19 1411  04/16/19 1850 04/16/19 1921 04/16/19 2102 04/16/19 2142 04/17/19 0620  NA 140 140  140   < > 146* 141 145 140 145  K 3.6 3.7  3.7   < > 4.2 3.4* 3.4* 3.5 4.1  CL 104 104  --   --  105  --  107  --   CO2 18*  --   --   --  24  --  20*  --   GLUCOSE 316* 310*  --   --  278*  --  268*  --   BUN 20 24*  --   --  22  --  23  --   CREATININE 1.45* 1.10  --   --  1.44*  --  1.54*  --   CALCIUM 8.2*  --   --   --  6.9*  --  7.4*  --    < > = values in this interval not displayed.    Liver Function Tests: Recent Labs  Lab 04/16/19 1335  AST 166*  ALT 211*  ALKPHOS 83  BILITOT 0.8  PROT 6.3*  ALBUMIN 3.5   No results for input(s): LIPASE, AMYLASE in the last 168 hours. No results for input(s): AMMONIA in the last 168 hours.  CBC: Recent Labs  Lab 04/16/19 1335  04/16/19 1701 04/16/19 1850 04/16/19 2102 04/17/19 0547 04/17/19 0620  WBC 27.5*  --   --   --   --  25.1*  --   NEUTROABS 19.3*  --   --   --   --   --   --   HGB 13.8   < > 13.6 13.3 11.6* 11.0* 10.9*  HCT 42.9   < > 40.0 39.0 34.0* 33.2* 32.0*  MCV 100.2*  --   --   --   --  96.8  --   PLT 331  --   --   --   --  377  --    < > = values in this interval not displayed.    Cardiac Enzymes: No results for input(s): CKTOTAL, CKMB, CKMBINDEX, TROPONINI in the last 168 hours.  BNP: BNP (last 3 results) No results for input(s): BNP in the last 8760 hours.  ProBNP (last 3 results) No results for input(s): PROBNP in the last 8760 hours.   CBG: Recent Labs  Lab 04/17/19 0258 04/17/19 0357 04/17/19 0455 04/17/19 0545 04/17/19 0653  GLUCAP 195* 257*  248*  219* 202*    Coagulation Studies: Recent Labs    04/16/19 1335 04/16/19 1921  LABPROT 16.3* 19.6*  INR 1.3* 1.7*     Imaging   Dg Chest Port 1 View  Result Date: 04/16/2019 CLINICAL DATA:  Cardiac arrest following surgery EXAM: PORTABLE CHEST 1 VIEW COMPARISON:  None. FINDINGS: Swan-Ganz catheter from below is within the central left pulmonary artery. Impella device tip is in the left ventricle. Endotracheal tube is 6 cm above the carina. NG tube is in the stomach. There is cardiomegaly with vascular congestion and bilateral perihilar opacities, likely edema. Low lung volumes. No effusions or pneumothorax. IMPRESSION: Support devices as above. Cardiomegaly with vascular congestion and perihilar edema. Low lung volumes. Electronically Signed   By: Rolm Baptise M.D.   On: 04/16/2019 18:56   Dg Chest Portable 1 View  Result Date: 04/16/2019 CLINICAL DATA:  Intubation.  Cardiac arrest status post CPR. EXAM: PORTABLE CHEST 1 VIEW COMPARISON:  None. FINDINGS: Endotracheal tube in good position with the tip 4.6 cm above the carina. Enteric tube entering the stomach with the tip below the field of view. The heart size and mediastinal contours are within normal limits. Mild diffuse ill-defined interstitial opacity with hazy airspace disease in the right upper lobe. No pleural effusion or pneumothorax. No acute osseous abnormality. IMPRESSION: 1. Appropriately positioned endotracheal tube. Enteric tube entering the stomach with the tip below the field of view. 2. Hazy diffuse interstitial and right upper lobe airspace disease, favor pulmonary edema. Electronically Signed   By: Titus Dubin M.D.   On: 04/16/2019 13:53     Medications:     Current Medications: . aspirin  81 mg Oral Daily  . chlorhexidine gluconate (MEDLINE KIT)  15 mL Mouth Rinse BID  . Chlorhexidine Gluconate Cloth  6 each Topical Daily  . Chlorhexidine Gluconate Cloth  6 each Topical Q0600  . fentaNYL (SUBLIMAZE) injection   25 mcg Intravenous Once  . heparin  4,000 Units Intravenous Once  . mouth rinse  15 mL Mouth Rinse 10 times per day  . mupirocin ointment  1 application Nasal BID  . pantoprazole (PROTONIX) IV  40 mg Intravenous Daily  . sodium chloride flush  3 mL Intravenous Q12H    Infusions: . ceFEPime (MAXIPIME) IV Stopped (04/17/19 8850)  . epinephrine 5 mcg/min (04/17/19 0700)  . fentaNYL infusion INTRAVENOUS 125 mcg/hr (04/17/19 0700)  . impella catheter heparin 50 unit/mL in dextrose 5%    . heparin 900 Units/hr (04/17/19 0623)  . insulin 11.4 mL/hr at 04/17/19 0700  . midazolam 3 mg/hr (04/17/19 0700)  . milrinone 0.2 mcg/kg/min (04/17/19 0700)  . norepinephrine (LEVOPHED) Adult infusion 38 mcg/min (04/17/19 0710)  . tirofiban 0.15 mcg/kg/min (04/17/19 0700)  . vancomycin Stopped (04/17/19 0028)    Assessment/Plan   1. 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 CAD with acute anterolateral STEMI - Cath 8/24 with 100% prox LAD. Unable to be opened with PCI - He will remain on aggrastat overnight - ASA/Statin - no b-blocker with shock  3. Acute systolic HF with Cardiogenic shock  - due to #2 - Continue Impella support. Currently @ P-9 with 3.6 L flow. Waveforms look good - On dual pressors + milrinone 0.25 mcg.  - CVP 5-6. No diuresis. May need to give a some  fluid.   4. Acute hypoxic respiratory failure  - has marked Aa gradient - on vent  5. Hematuria - On aggrastat. Will need to assess position of impella.   6. AKI  Creatinine trending up   7. ID On Cefepime/Vanc Febrile  WBC 27.  Blood CX - NGTD  -Length of Stay: 1  Amy Clegg, NP  04/17/2019, 7:23 AM  Advanced Heart Failure Team Pager [phone] (M-F; [place])  Please contact [facility] for night-coverage after hours (4p -7a ) and weekends on  [email or web address]  Agree with above.   Admitted with OOH VF arrest yesterday with 20 mins CPR. Totalled LAD on cath but unable to be opened. Impella placed.   No on high-dose NE, EPI and milrinone. Impella at p-9 with 3.6-3.6 L flow. Waveforms ok.   Remains on aggrastat. + bloody urine. Hemodynamics improved.   Intubated/sedated +ETT Cor Reg + tachy. Impella hum Lungs coarse Ab obese NT Ext: LFA impella site ok RFA swan RFV arterial line  Remains critically ill. Echo reviewed personally - very  limited images EF ~25-30% % Impella appears a bit shallow so I advanced it but it would continually steer into posterior wal  and trigger suction alarms. So I had to pull it back. Sitting proximal underneath MV but flows good. Remains on high-dose pressors. Wean NE as tolerated. Continue empiric abx. Hoping for stable day. Would not attempt wake up assessment today.   CRITICAL CARE Performed by: Glori Bickers  Total critical care time: 35 minutes  Critical care time was exclusive of separately billable procedures and treating other patients.  Critical care was necessary to treat or prevent imminent or life-threatening deterioration.  Critical care was time spent personally by me (independent of midlevel providers or residents) on the following activities: development of treatment plan with patient and/or surrogate as well as nursing, discussions with consultants, evaluation of patient's response to treatment, examination of patient, obtaining history from patient or surrogate, ordering and performing treatments and interventions, ordering and review of laboratory studies, ordering and review of radiographic studies, pulse oximetry and re-evaluation of patient's condition.   Glori Bickers, MD  8:20 AM

## 2019-04-17 NOTE — Progress Notes (Signed)
ANTICOAGULATION CONSULT NOTE - Follow Up Consult  Pharmacy Consult for Heparin, Aggrastat Indication: Impella, ACS  No Known Allergies  Patient Measurements: Height: 5\' 6"  (167.6 cm) Weight: 201 lb 4.5 oz (91.3 kg) IBW/kg (Calculated) : 63.8 Heparin Dosing Weight: 82.3  Vital Signs: Temp: 98.2 F (36.8 C) (08/25 1500) Temp Source: Core (08/25 1200) BP: 108/72 (08/25 1500) Pulse Rate: 46 (08/25 1300)  Labs: Recent Labs    04/16/19 1335  04/16/19 1921 04/16/19 2102 04/16/19 2142 04/17/19 0547 04/17/19 0620 04/17/19 1254  HGB 13.8   < >  --  11.6*  --  11.0* 10.9*  --   HCT 42.9   < >  --  34.0*  --  33.2* 32.0*  --   PLT 331  --   --   --   --  377  --   --   APTT 36  --  52*  --   --   --   --   --   LABPROT 16.3*  --  19.6*  --   --   --   --   --   INR 1.3*  --  1.7*  --   --   --   --   --   CREATININE 1.45*   < > 1.44*  --  1.54* 1.80*  --  2.06*  TROPONINIHS 226*  --  25,778*  --   --   --   --   --    < > = values in this interval not displayed.    Estimated Creatinine Clearance: 36.3 mL/min (A) (by C-G formula based on SCr of 2.06 mg/dL (H)).   Medical History: No past medical history on file.  Medications:  Infusions:  . sodium chloride    . amiodarone 60 mg/hr (04/17/19 1500)   Followed by  . amiodarone    . ceFEPime (MAXIPIME) IV    . epinephrine 2 mcg/min (04/17/19 1500)  . feeding supplement (VITAL AF 1.2 CAL)    . fentaNYL infusion INTRAVENOUS 125 mcg/hr (04/17/19 1500)  . impella catheter heparin 50 unit/mL in dextrose 5%    . heparin 1,000 Units/hr (04/17/19 1500)  . insulin 0.9 mL/hr at 04/17/19 1500  . midazolam 4 mg/hr (04/17/19 1500)  . milrinone 0.2 mcg/kg/min (04/17/19 1500)  . norepinephrine (LEVOPHED) Adult infusion 18 mcg/min (04/17/19 1500)  . tirofiban 0.075 mcg/kg/min (04/17/19 1500)  . vancomycin      Assessment: 68 yo male admitted with STEMI, Impella placed in cath lab.  Heparin currently in purge solution is running at  10.5 mL/hr (525 units/hr) and systemic heparin is running at 10 mL/hr (1000 units/hr). ACT therapeutic at 164.   Aggrastat this AM running at 1.5 mcg/kg/min. Reduced down to 0.075 mcg/kg/min due to decline in renal function (est CrCl ~ 40 mL/min). Total duration now ~24 hours.  Urine dark red, Hgb trending down further today.   Goal of Therapy:  ACT goal 160-180 Monitor platelets by anticoagulation protocol: Yes   Plan:  Continue heparin in purge solution and systemic heparin. RN to titrate systemic heparin per protocol to goal ACT. Monitor for bleeding. Stop Aggrastat per discussion between PharmD, Dr. Haroldine Laws, and Dr. Corlis Hove, PharmD PGY2 Cardiology Pharmacy Resident Phone [phone] 04/17/2019       4:12 PM  Please check [email or web address] for unit-specific pharmacist phone numbers

## 2019-04-17 NOTE — Progress Notes (Signed)
Initial Nutrition Assessment  DOCUMENTATION CODES:   Not applicable  INTERVENTION:   Tube feeding:  -Vital AF 1.2 @ 20 ml/hr via OGT -Increase as medically feasible to goal rate of 60 ml/hr (1440 ml) -30 ml Prostat BID  At goal TF provides: 1928 kcals, 138 grams protein, 1097 ml free water.   NUTRITION DIAGNOSIS:   Increased nutrient needs related to post-op healing as evidenced by estimated needs.  GOAL:   Patient will meet greater than or equal to 90% of their needs  MONITOR:   Diet advancement, Vent status, Weight trends, I & O's, TF tolerance, Skin  REASON FOR ASSESSMENT:   Ventilator    ASSESSMENT:   Patient with unknown PMH. Presents this admission with cardiac arrest due to STEMI.   8/24- L groin impella placed, PCI unsuccessful   Pt discussed during ICU rounds and with RN.   Requiring multiple pressors. Febrile overnight. Normothermia protocol. Plan to return to cath lab in next 24-48 hours. Spoke with CCM, okay to start trickle feeds only given pressor requirements. Will monitor labs closely. Phosphorus to be rechecked in am.   Admission weight: 88.4 kg Current weight: 91.3 kg  Pt will need NFPE at follow up as he was undergoing bedside procedure at time of RD visit.   Patient is currently intubated on ventilator support MV: 10.5 L/min Temp (24hrs), Avg:99.4 F (37.4 C), Min:98.1 F (36.7 C), Max:101.1 F (38.4 C)    I/O: +2,474 ml since admit UOP: 1,892 ml x 24 hrs   Drips: amiodarone, epinephrine, insulin, milrinone, levophed, sodium phosphate Labs: CBG 77-174 phosphorus 1.3 (L) creatinine 2.06- trending up  Diet Order:   Diet Order            Diet NPO time specified  Diet effective now              EDUCATION NEEDS:   Not appropriate for education at this time  Skin:  Skin Assessment: Reviewed RN Assessment  Last BM:  PTA  Height:   Ht Readings from Last 1 Encounters:  04/16/19 5\' 6"  (1.676 m)    Weight:   Wt Readings  from Last 1 Encounters:  04/17/19 91.3 kg    Ideal Body Weight:  64.5 kg  BMI:  Body mass index is 32.49 kg/m.  Estimated Nutritional Needs:   Kcal:  1900 kcal  Protein:  129-149 grams  Fluid:  >/= 1.9 L/day   Mariana Single RD, LDN Clinical Nutrition Pager # [phone]

## 2019-04-17 NOTE — Progress Notes (Signed)
Stopped by patient's room per nurse. Family had already left for the day.  Will follow-up as needed.

## 2019-04-17 NOTE — Progress Notes (Signed)
1834: Dr Nelda Marseille CCM rounder at bedside post pt arrival to [facility] ([facility]) from cath lab Impella placement. MD verbalized that Code Cool would not be activated on this patient. Charge nurse at bedside with primary RN. Pads not placed on patient d/t verbal order. Will continue to monitor closely and trend temperature.

## 2019-04-17 NOTE — Progress Notes (Signed)
Orthopedic Tech Progress Note Patient Details:  Lawrence Olson 11/25/1950 [id number] RN said has on bilateral knee immobilizer  Patient ID: Lawrence Olson, male   DOB: 1951-03-03, 68 y.o.   [name] [id number]   Lawrence Olson 04/17/2019, 8:13 AM

## 2019-04-18 ENCOUNTER — Inpatient Hospital Stay (HOSPITAL_COMMUNITY): Payer: PPO

## 2019-04-18 DIAGNOSIS — I2102 ST elevation (STEMI) myocardial infarction involving left anterior descending coronary artery: Secondary | ICD-10-CM

## 2019-04-18 LAB — BASIC METABOLIC PANEL
Anion gap: 6 (ref 5–15)
BUN: 38 mg/dL — ABNORMAL HIGH (ref 8–23)
CO2: 21 mmol/L — ABNORMAL LOW (ref 22–32)
Calcium: 7.5 mg/dL — ABNORMAL LOW (ref 8.9–10.3)
Chloride: 112 mmol/L — ABNORMAL HIGH (ref 98–111)
Creatinine, Ser: 2.06 mg/dL — ABNORMAL HIGH (ref 0.61–1.24)
GFR calc Af Amer: 37 mL/min — ABNORMAL LOW (ref >60)
GFR calc non Af Amer: 32 mL/min — ABNORMAL LOW (ref >60)
Glucose, Bld: 179 mg/dL — ABNORMAL HIGH (ref 70–99)
Potassium: 3.5 mmol/L (ref 3.5–5.1)
Sodium: 139 mmol/L (ref 135–145)

## 2019-04-18 LAB — GLUCOSE, CAPILLARY
Glucose-Capillary: 118 mg/dL — ABNORMAL HIGH (ref 70–99)
Glucose-Capillary: 127 mg/dL — ABNORMAL HIGH (ref 70–99)
Glucose-Capillary: 130 mg/dL — ABNORMAL HIGH (ref 70–99)
Glucose-Capillary: 136 mg/dL — ABNORMAL HIGH (ref 70–99)
Glucose-Capillary: 143 mg/dL — ABNORMAL HIGH (ref 70–99)
Glucose-Capillary: 144 mg/dL — ABNORMAL HIGH (ref 70–99)
Glucose-Capillary: 146 mg/dL — ABNORMAL HIGH (ref 70–99)
Glucose-Capillary: 150 mg/dL — ABNORMAL HIGH (ref 70–99)
Glucose-Capillary: 156 mg/dL — ABNORMAL HIGH (ref 70–99)
Glucose-Capillary: 157 mg/dL — ABNORMAL HIGH (ref 70–99)
Glucose-Capillary: 170 mg/dL — ABNORMAL HIGH (ref 70–99)
Glucose-Capillary: 177 mg/dL — ABNORMAL HIGH (ref 70–99)
Glucose-Capillary: 190 mg/dL — ABNORMAL HIGH (ref 70–99)
Glucose-Capillary: 90 mg/dL (ref 70–99)

## 2019-04-18 LAB — TROPONIN I (HIGH SENSITIVITY): Troponin I (High Sensitivity): 9611 ng/L (ref <18)

## 2019-04-18 LAB — POCT ACTIVATED CLOTTING TIME
Activated Clotting Time: 175 seconds
Activated Clotting Time: 169 seconds
Activated Clotting Time: 158 seconds
Activated Clotting Time: 164 seconds
Activated Clotting Time: 164 seconds

## 2019-04-18 LAB — LACTATE DEHYDROGENASE: LDH: 1200 U/L — ABNORMAL HIGH (ref 98–192)

## 2019-04-18 LAB — ECHOCARDIOGRAM LIMITED
Height: 66 in
Weight: 3527.36 oz

## 2019-04-18 LAB — CBC
HCT: 25.2 % — ABNORMAL LOW (ref 39.0–52.0)
Hemoglobin: 8.3 g/dL — ABNORMAL LOW (ref 13.0–17.0)
MCH: 32.7 pg (ref 26.0–34.0)
MCHC: 32.9 g/dL (ref 30.0–36.0)
MCV: 99.2 fL (ref 80.0–100.0)
Platelets: 251 10*3/uL (ref 150–400)
RBC: 2.54 MIL/uL — ABNORMAL LOW (ref 4.22–5.81)
RDW: 13.4 % (ref 11.5–15.5)
WBC: 27.3 10*3/uL — ABNORMAL HIGH (ref 4.0–10.5)
nRBC: 0 % (ref 0.0–0.2)

## 2019-04-18 LAB — HEPATIC FUNCTION PANEL
ALT: 85 U/L — ABNORMAL HIGH (ref 0–44)
AST: 102 U/L — ABNORMAL HIGH (ref 15–41)
Albumin: 2.3 g/dL — ABNORMAL LOW (ref 3.5–5.0)
Alkaline Phosphatase: 39 U/L (ref 38–126)
Bilirubin, Direct: 0.4 mg/dL — ABNORMAL HIGH (ref 0.0–0.2)
Indirect Bilirubin: 1 mg/dL — ABNORMAL HIGH (ref 0.3–0.9)
Total Bilirubin: 1.4 mg/dL — ABNORMAL HIGH (ref 0.3–1.2)
Total Protein: 4.6 g/dL — ABNORMAL LOW (ref 6.5–8.1)

## 2019-04-18 LAB — COOXEMETRY PANEL
Carboxyhemoglobin: 1.4 % (ref 0.5–1.5)
Methemoglobin: 1.9 % — ABNORMAL HIGH (ref 0.0–1.5)
O2 Saturation: 65.3 %
Total hemoglobin: 8.4 g/dL — ABNORMAL LOW (ref 12.0–16.0)

## 2019-04-18 LAB — LACTIC ACID, PLASMA: Lactic Acid, Venous: 0.9 mmol/L (ref 0.5–1.9)

## 2019-04-18 MED ORDER — POTASSIUM CHLORIDE 20 MEQ PO PACK
40.0000 meq | PACK | Freq: Once | ORAL | Status: AC
  Administered 2019-04-18: 08:00:00 40 meq
  Filled 2019-04-18: qty 2

## 2019-04-18 MED ORDER — MEPERIDINE HCL 25 MG/ML IJ SOLN
12.5000 mg | INTRAMUSCULAR | Status: DC | PRN
  Filled 2019-04-18: qty 1

## 2019-04-18 MED ORDER — INSULIN ASPART 100 UNIT/ML ~~LOC~~ SOLN
2.0000 [IU] | SUBCUTANEOUS | Status: DC
  Administered 2019-04-18 – 2019-04-19 (×3): 4 [IU] via SUBCUTANEOUS
  Administered 2019-04-19: 6 [IU] via SUBCUTANEOUS
  Administered 2019-04-19: 4 [IU] via SUBCUTANEOUS

## 2019-04-18 MED ORDER — SODIUM CHLORIDE 0.9 % IV BOLUS
250.0000 mL | Freq: Once | INTRAVENOUS | Status: AC
  Administered 2019-04-18: 12:00:00 250 mL via INTRAVENOUS

## 2019-04-18 MED ORDER — MEPERIDINE HCL 25 MG/ML IJ SOLN
12.5000 mg | Freq: Once | INTRAMUSCULAR | Status: AC
  Administered 2019-04-18: 05:00:00 12.5 mg via INTRAVENOUS

## 2019-04-18 MED ORDER — VITAL AF 1.2 CAL PO LIQD
1000.0000 mL | ORAL | Status: DC
  Administered 2019-04-18 – 2019-04-23 (×5): 1000 mL

## 2019-04-18 MED ORDER — FUROSEMIDE 10 MG/ML IJ SOLN
40.0000 mg | Freq: Once | INTRAMUSCULAR | Status: AC
  Administered 2019-04-18: 13:00:00 40 mg via INTRAVENOUS

## 2019-04-18 MED ORDER — ATORVASTATIN CALCIUM 80 MG PO TABS
80.0000 mg | ORAL_TABLET | Freq: Every day | ORAL | Status: DC
  Administered 2019-04-18 – 2019-04-30 (×13): 80 mg
  Filled 2019-04-18 (×11): qty 1

## 2019-04-18 MED ORDER — SODIUM CHLORIDE 0.9 % IV BOLUS
250.0000 mL | Freq: Once | INTRAVENOUS | Status: AC
  Administered 2019-04-18: 250 mL via INTRAVENOUS

## 2019-04-18 MED ORDER — FAMOTIDINE 40 MG/5ML PO SUSR
20.0000 mg | Freq: Every day | ORAL | Status: DC
  Administered 2019-04-18 – 2019-05-01 (×14): 20 mg
  Filled 2019-04-18 (×14): qty 2.5

## 2019-04-18 MED ORDER — INSULIN ASPART 100 UNIT/ML ~~LOC~~ SOLN
4.0000 [IU] | SUBCUTANEOUS | Status: DC
  Administered 2019-04-18 – 2019-04-19 (×6): 4 [IU] via SUBCUTANEOUS

## 2019-04-18 MED ORDER — "THROMBI-PAD 3""X3"" EX PADS"
1.0000 | MEDICATED_PAD | Freq: Once | CUTANEOUS | Status: AC
  Administered 2019-04-18: 10:00:00 1 via TOPICAL
  Filled 2019-04-18: qty 1

## 2019-04-18 MED ORDER — FUROSEMIDE 10 MG/ML IJ SOLN
INTRAMUSCULAR | Status: AC
  Administered 2019-04-18: 13:00:00 40 mg via INTRAVENOUS
  Filled 2019-04-18: qty 4

## 2019-04-18 MED ORDER — FUROSEMIDE 10 MG/ML IJ SOLN
20.0000 mg | Freq: Once | INTRAMUSCULAR | Status: AC
  Administered 2019-04-18: 20 mg via INTRAVENOUS
  Filled 2019-04-18: qty 2

## 2019-04-18 MED ORDER — POTASSIUM CHLORIDE 20 MEQ PO PACK: 40.0000 meq | PACK | Freq: Once | ORAL | Status: DC

## 2019-04-18 NOTE — ED Notes (Signed)
Wasted 50 mcg Fentanyl with Hassan Rowan, RN

## 2019-04-18 NOTE — Progress Notes (Signed)
Contacted Elink RN for lab result of potassium 3.5 and for increase in creatinine from 1.90 to 2.06. No orders received.

## 2019-04-18 NOTE — Plan of Care (Signed)
  Problem: Education: °Goal: Knowledge of General Education information will improve °Description: Including pain rating scale, medication(s)/side effects and non-pharmacologic comfort measures °Outcome: Not Progressing °  °Problem: Health Behavior/Discharge Planning: °Goal: Ability to manage health-related needs will improve °Outcome: Not Progressing °  °Problem: Activity: °Goal: Risk for activity intolerance will decrease °Outcome: Not Progressing °  °

## 2019-04-18 NOTE — Progress Notes (Signed)
2D Echocardiogram has been performed.  Lawrence Olson 04/18/2019, 9:12 AM

## 2019-04-18 NOTE — ED Notes (Signed)
50 mcg fentanyl wasted in sharps with RN Charise Carwin.

## 2019-04-18 NOTE — Progress Notes (Signed)
[facility] Progress Note Patient Name: Lawrence Olson DOB: 08/14/1951 [name] [id number]   Date of Service  04/18/2019  HPI/Events of Note  Pt with episodic whole body shaking movements r/o shivering vs atypical seizures.  eICU Interventions  Meperidine 12.5 mg iv Q 2 hours prn, cEEG to r/o seizures (unlikely because pt is on a Versed infusion @ 8 mg/hr)        Annaleigh Steinmeyer U Nayson Traweek 04/18/2019, 3:35 AM

## 2019-04-18 NOTE — Progress Notes (Signed)
Pt vent alarming, noted pt appears to be dyssynchronous slightly on vent, double stacking breaths/ increased PIP.  RN administering more sedation, sat appeared to be 80-88%.  fio2 increased to 50%.  Sat now 97%, pt appears more comfortable currently.

## 2019-04-18 NOTE — Progress Notes (Signed)
Pharmacist called back after speaking w/ Dr. Lucile Shutters. Will give x1 dose of demerol but no more doses. Hoping this will help keep pt from shivering until EEG can be performed.

## 2019-04-18 NOTE — Progress Notes (Signed)
NAMECael Olson, [name]  [id number], DOB:  04-May-1951, LOS: 0 ADMISSION DATE:  04/16/2019, CONSULTATION DATE:  04/16/2019 REFERRING MD:  Claiborne Billings CHIEF COMPLAINT:  STEMI, resp failure  Brief History   68yo M out of hospital cardiac arrest with ROSC, intubated, found to have STEMI, taken emergently to cath lab. Proximal LAD lesion. PCCM consulted for management assistance.   History of present illness   68 yo male unknown past medical history presented to the emergency department via EMS with cardiac arrest.  Per EMR the patient was at work and suddenly collapsed with bystander CPR performed.  EMS arrived and performed approximately 20 minutes of CPR, King airway placed.  Patient was found to be in ventricular fibrillation.  He received 7 epinephrines and multiple shocks as well as 450 mg of amiodarone. ROSC was obtained and patient was transported to the emergency department.  He was intubated in the emergency department and reportedly moving spontaneously with some purposeful movements to pain. He was hypoxic which improved with definitive airway placement and PEEP increased to 10. ABG showed pH of 7.19, PaO2 of 73 PCO2 of 49. He was breathing spontaneously on his own.  Was noted to have anterior STEMI on EKG and taken directly to the Cath Lab.  Patient was found to have significant proximal left anterior descending.  At time of evaluation possible PTCI/Impella placement pending.  PCCM consulted for management assistance.  At time of evaluation complete work-up/labs also still pending  Past Medical History  [facility] Events   8/24: Cath Lab. PTCI unsuccessful despite multiple attempts.  RA 13 RV 42/16 PA 40/26 (31) PCWP 18 Pa sat 50%  Fick 2.7/1.3 CPO 0.6 Impella placed. VT /p same requiring defib.  Consults:  Cardiology PCCM Heart failure   Procedures:  8/24: PTCI unsuccessful 8/14: Impella L groin>> 8/24: 7.5 ETT>> 8/24: Right femoral venous sheath with  PA cath>> 8/24: Right femoral arterial sheath>> 8/24: Left femoral arterial sheath>> 8/24: L IJ CVC >>>  Significant Diagnostic Tests:    Micro Data:  COVID-19 pending  Antimicrobials:  NA  Interim history/subjective:  Epi gtt off, agrrostat off  Weaning levophed gtt  Milrinone remains Off sedation, moving, withdraws to pain  Co-ox 65% impella P9   Objective    Today's Vitals   04/18/19 0645 04/18/19 0700 04/18/19 0759 04/18/19 0800  BP:  104/68 (!) 84/53 98/66  Pulse: 87 86 88 88  Resp: (!) 21 (!) 21 (!) 21 (!) 21  Temp: 98.8 F (37.1 C) 98.8 F (37.1 C)  99 F (37.2 C)  TempSrc:    Core  SpO2: 100% 100% 100% 99%  Weight:      Height:      PainSc:       Vent Mode: PRVC FiO2 (%):  [40 %] 40 % Set Rate:  [21 bmp] 21 bmp Vt Set:  [510 mL] 510 mL PEEP:  [10 cmH20] 10 cmH20 Plateau Pressure:  [22 GMW10-27 cmH20] 23 cmH20   Examination:  General:  Acutely ill appearing male, NAD on vent, more hemodynamically stable  HEENT: MM pink/moist, ETT, L IJ CVL dressed, icteric sclera, scleredema   Neuro: off sedation for WUA, MAE, not following commands, opens eyes to voice CV: s1s2 rrr, no m/r/g, NSR  PULM:  resps even non labored on vent, diminished bases  GI: soft, hypoactive bs  Extremities: warm/dry, mild generalized edema, groin lines intact, c/d Skin: no rashes or lesions    Resolved Hospital Problem list  N/A  Assessment & Plan:  68 year old male with no known PMH who presents to PCCM in the cath lab post cardiac arrest while at work with bystander CPR started for a total of 24 minutes and 8 rounds of epi with initial presentation is VF and multiple shocks.  Patient was taken to the cath lab and found to have 100% proximal LAD lesion. PCI unsuccessful so impella placed.    Cardiac arrest/ VF- r/t anterior MI ~75mins downtime CAD with acute STEMI - 100% prox LAD - PCI unsuccessful  - cardiology managing  - continue amio gtt  - monitor electrolytes   - normothermia  - ASA, statin  - heparin gtt per pharmacy  - plans for return to cath lab in next 24-48hrs    Acute hypoxic respiratory failure - r/t MI, cardiac arrest, cardiogenic shock -Vent support - 8cc/kg  -F/u CXR  -F/u ABG - no vent weaning for now   -VAP prevention  - Titrate O2 for sat of 40-34%  Acute systolic heart failure  Cardiogenic shock - improved - weaning pressors - nearly off - impella support - P9  - milrinone  - monitor CVP  - cards managing  - gentle diuresis per cards   Fever  - normothermia protocol  - continue empiric abx D2 cefepime/vanc  - follow culture data   AKI - s/p cardiac arrest  - monitor   Anemia - hematuria resolved  - f/u CBC  - aggrastat off   Icteric sclera  - check LFT's now   Acute encephalopathy - post cardiac arrest. No hypothermia protocol given reported purposeful movement post arrest  - sedation protocol  - daily WUA - may need some low dose analgesic at least  - normothermia protocol as above     Labs   CBC: Recent Labs  Lab 04/16/19 1335  04/16/19 2102 04/17/19 0547 04/17/19 0620 04/17/19 1306 04/18/19 0253  WBC 27.5*  --   --  25.1*  --   --  27.3*  NEUTROABS 19.3*  --   --   --   --   --   --   HGB 13.8   < > 11.6* 11.0* 10.9* 9.2* 8.3*  HCT 42.9   < > 34.0* 33.2* 32.0* 27.0* 25.2*  MCV 100.2*  --   --  96.8  --   --  99.2  PLT 331  --   --  377  --   --  251   < > = values in this interval not displayed.   Basic Metabolic Panel: Recent Labs  Lab 04/16/19 1921  04/16/19 2142 04/17/19 0547 04/17/19 0620 04/17/19 1254 04/17/19 1306 04/18/19 0253  NA 141   < > 140 140 145 142 146* 139  K 3.4*   < > 3.5 4.0 4.1 3.9 3.9 3.5  CL 105  --  107 110  --  112* 109 112*  CO2 24  --  20* 19*  --  21*  --  21*  GLUCOSE 278*  --  268* 251*  --  143* 136* 179*  BUN 22  --  23 27*  --  32* 34* 38*  CREATININE 1.44*  --  1.54* 1.80*  --  2.06* 1.90* 2.06*  CALCIUM 6.9*  --  7.4* 7.3*  --  7.5*  --   7.5*  MG  --   --   --  2.0  --  1.9  --   --   PHOS  --   --   --  1.3*  --   --   --   --    < > = values in this interval not displayed.   GFR: Estimated Creatinine Clearance: 38 mL/min (A) (by C-G formula based on SCr of 2.06 mg/dL (H)). Recent Labs  Lab 04/16/19 1335 04/16/19 2200 04/17/19 0103 04/17/19 0547 04/18/19 0253  WBC 27.5*  --   --  25.1* 27.3*  LATICACIDVEN  --  6.4* 6.8* 4.5*  --    Liver Function Tests: Recent Labs  Lab 04/16/19 1335  AST 166*  ALT 211*  ALKPHOS 83  BILITOT 0.8  PROT 6.3*  ALBUMIN 3.5   No results for input(s): LIPASE, AMYLASE in the last 168 hours. No results for input(s): AMMONIA in the last 168 hours.  ABG    Component Value Date/Time   PHART 7.305 (L) 04/16/2019 2102   PCO2ART 47.7 04/16/2019 2102   PO2ART 279.0 (H) 04/16/2019 2102   HCO3 20.7 04/17/2019 0620   TCO2 21 (L) 04/17/2019 1306   ACIDBASEDEF 5.0 (H) 04/17/2019 0620   O2SAT 65.3 04/18/2019 0320     Coagulation Profile: Recent Labs  Lab 04/16/19 1335 04/16/19 1921  INR 1.3* 1.7*   Cardiac Enzymes: No results for input(s): CKTOTAL, CKMB, CKMBINDEX, TROPONINI in the last 168 hours.  HbA1C: No results found for: HGBA1C  CBG: Recent Labs  Lab 04/18/19 0302 04/18/19 0355 04/18/19 0514 04/18/19 0603 04/18/19 0700  GLUCAP 156* 146* 150* 143* 136*     The patient is critically ill with multiple organ systems failure and requires high complexity decision making for assessment and support, frequent evaluation and titration of therapies, application of advanced monitoring technologies and extensive interpretation of multiple databases.   Critical Care Time devoted to patient care services described in this note is 32  Minutes.    Nickolas Madrid, NP 04/18/2019  9:53 AM Pager: [phone] or [phone]

## 2019-04-18 NOTE — Progress Notes (Signed)
Pharmacist called RN. Pharmacist notified RN that demerol is no longer used for this type of patient and only used in PACU. Pharmacist stated she would call Dr. Lucile Shutters for an alternative. Pharmacist will call RN back with more information.

## 2019-04-18 NOTE — Progress Notes (Signed)
ANTICOAGULATION CONSULT NOTE - Follow Up Consult  Pharmacy Consult for Heparin Indication: Impella, ACS  No Known Allergies  Patient Measurements: Height: 5\' 6"  (167.6 cm) Weight: 220 lb 7.4 oz (100 kg) IBW/kg (Calculated) : 63.8 Heparin Dosing Weight: 82.3  Vital Signs: Temp: 98.8 F (37.1 C) (08/26 0700) Temp Source: Core (08/26 0400) BP: 104/68 (08/26 0700) Pulse Rate: 86 (08/26 0700)  Labs: Recent Labs    04/16/19 1335  04/16/19 1921  04/17/19 0547 04/17/19 0620 04/17/19 1254 04/17/19 1306 04/18/19 0253  HGB 13.8   < >  --    < > 11.0* 10.9*  --  9.2* 8.3*  HCT 42.9   < >  --    < > 33.2* 32.0*  --  27.0* 25.2*  PLT 331  --   --   --  377  --   --   --  251  APTT 36  --  52*  --   --   --   --   --   --   LABPROT 16.3*  --  19.6*  --   --   --   --   --   --   INR 1.3*  --  1.7*  --   --   --   --   --   --   CREATININE 1.45*   < > 1.44*   < > 1.80*  --  2.06* 1.90* 2.06*  TROPONINIHS 226*  --  39,767*  --   --   --   --   --  9,611*   < > = values in this interval not displayed.    Estimated Creatinine Clearance: 38 mL/min (A) (by C-G formula based on SCr of 2.06 mg/dL (H)).   Medical History: No past medical history on file.  Medications:  Infusions:  . sodium chloride 10 mL/hr at 04/18/19 0700  . amiodarone 30 mg/hr (04/18/19 0700)  . ceFEPime (MAXIPIME) IV Stopped (04/17/19 2138)  . epinephrine 3 mcg/min (04/18/19 0700)  . feeding supplement (VITAL AF 1.2 CAL) 1,000 mL (04/17/19 1722)  . fentaNYL infusion INTRAVENOUS 125 mcg/hr (04/18/19 0700)  . impella catheter heparin 50 unit/mL in dextrose 5%    . heparin 1,000 Units/hr (04/18/19 0700)  . insulin 3.4 mL/hr at 04/18/19 0700  . midazolam 6 mg/hr (04/18/19 0700)  . milrinone 0.2 mcg/kg/min (04/18/19 0700)  . norepinephrine (LEVOPHED) Adult infusion 11 mcg/min (04/18/19 0700)  . vancomycin Stopped (04/17/19 2301)    Assessment: 68 yo male admitted with STEMI, Impella placed in cath lab.   Heparin currently in purge solution is running at 10.5 mL/hr (525 units/hr) and systemic heparin is running at 10 mL/hr (1000 units/hr). ACT therapeutic at 169.   Aggrastat now off  Hematuria has resolved but now noting oozing around LIJ site and R femoral line  Hgb trending down further today to 8.3. Pltc remains wnl.   Goal of Therapy:  ACT goal 160-180 Monitor platelets by anticoagulation protocol: Yes   Plan:  Continue heparin in purge solution and systemic heparin. RN to titrate systemic heparin per protocol to goal ACT. Monitor for bleeding.  Vertis Kelch, PharmD PGY2 Cardiology Pharmacy Resident Phone [phone] 04/18/2019       7:35 AM  Please check [email or web address] for unit-specific pharmacist phone numbers

## 2019-04-18 NOTE — Progress Notes (Signed)
Called the physican to camera into room for increased shivering. Shivering was noted at the beginning of shift to be sparse (approximately once per hour). Severe shivering has become frequent (multiple times in an hour). Patient exhibits no posturing or extraocular movements. Vital signs remain stable throughout shivering episodes. Patient has been afebrile throughout shift. Cooling blanket is under patient. Physician stated he would order medication for shivering and a possible EEG may be needed.

## 2019-04-18 NOTE — Progress Notes (Addendum)
Advanced Heart Failure Team  Note   Primary Physician: Patient, No Pcp Per PCP-Cardiologist:  No primary care provider on file.  Reason for Consultation: Cardiogenic shock  HPI:    Remains sedated on vent. Shivering overnight. CCM concerned about seizures. EEG ordered   Currently on Epi 4 mcg , NE 14 mcg , Milrinone 0.25 mcg. CO-OX 65%.  Started amio with to suppress PVCs.   Impella P9 3.7 liters Swan #s  RA 11 PA 38/19  Fick 7.5/3.8  SVR 700  CPO 1.2  Co-ox  65%   Review of Systems: Unavailable as patient intubated/sedated  Home Medications Prior to Admission medications   Medication Sig Start Date End Date Taking? Authorizing Provider  fluocinolone (SYNALAR) 0.01 % external solution Apply 1 application topically 2 (two) times daily. For 14 days 03/22/19   [provider]  ketoconazole (NIZORAL) 2 % shampoo Apply 5 mLs topically 3 (three) times a week. 03/17/19   [provider]    Past Medical History: No past medical history on file. - Unavailable as patient intubated/sedated  Past Surgical History: Past Surgical History:  Procedure Laterality Date  . RIGHT HEART CATH AND CORONARY ANGIOGRAPHY N/A 04/16/2019   Procedure: RIGHT HEART CATH AND CORONARY ANGIOGRAPHY;  Surgeon: Troy Sine, MD;  Location: [facility];  Service: Cardiovascular;  Laterality: N/A;  . VENTRICULAR ASSIST DEVICE INSERTION N/A 04/16/2019   Procedure: VENTRICULAR ASSIST DEVICE INSERTION;  Surgeon: Troy Sine, MD;  Location: [facility];  Service: Cardiovascular;  Laterality: N/A;   Unavailable as patient intubated/sedated  Family History: No family history on file. Unavailable as patient intubated/sedated  Social History: Social History   Socioeconomic History  . Marital status: Married    Spouse name: Not on file  . Number of children: Not on file  . Years of education: Not on file  . Highest education level: Not on file  Occupational History  .  Not on file  Social Needs  . Financial resource strain: Not on file  . Food insecurity    Worry: Not on file    Inability: Not on file  . Transportation needs    Medical: Not on file    Non-medical: Not on file  Tobacco Use  . Smoking status: Former Research scientist (life sciences)  . Smokeless tobacco: Never Used  Substance and Sexual Activity  . Alcohol use: Yes    Comment: rarely  . Drug use: Not on file  . Sexual activity: Not on file  Lifestyle  . Physical activity    Days per week: Not on file    Minutes per session: Not on file  . Stress: Not on file  Relationships  . Social Herbalist on phone: Not on file    Gets together: Not on file    Attends religious service: Not on file    Active member of club or organization: Not on file    Attends meetings of clubs or organizations: Not on file    Relationship status: Not on file  Other Topics Concern  . Not on file  Social History Narrative  . Not on file    Allergies:  No Known Allergies  Objective:    Vital Signs:   Temp:  [98.2 F (36.8 C)-100.6 F (38.1 C)] 98.8 F (37.1 C) (08/26 0700) Pulse Rate:  [46-116] 86 (08/26 0700) Resp:  [15-23] 21 (08/26 0700) BP: (92-114)/(60-86) 104/68 (08/26 0700) SpO2:  [94 %-100 %] 100 % (  08/26 0700) Arterial Line BP: (89-116)/(55-76) 105/61 (08/26 0700) FiO2 (%):  [40 %] 40 % (08/26 0400) Weight:  [100 kg] 100 kg (08/26 0500) Last BM Date: (PTA)  Weight change: Filed Weights   04/16/19 1805 04/17/19 0500 04/18/19 0500  Weight: 88.4 kg 91.3 kg 100 kg    Intake/Output:   Intake/Output Summary (Last 24 hours) at 04/18/2019 0705 Last data filed at 04/18/2019 0700 Gross per 24 hour  Intake 3810.19 ml  Output 1005 ml  Net 2805.19 ml      Physical Exam   General:  Intubated.Sedated  HEENT: ETT  Neck: supple. JVP 11-12. . Carotids 2+ bilat; no bruits. No lymphadenopathy or thryomegaly appreciated.LIJ oozing.  Cor: PMI nondisplaced. Regular rate & rhythm. No rubs, gallops or  murmurs. Lungs: Coarse throughout Abdomen: soft, nontender, nondistended. No hepatosplenomegaly. No bruits or masses. Good bowel sounds. Extremities: no cyanosis, clubbing, rash, edema. Left Femoral Impella R Femoral Swan oozing around swan Neuro: Intubated  GU: Foley yellow urine   Telemetry  NSR 90s with PVCs     EKG    Sinus with marked anterolateral ST elevation  Labs   Basic Metabolic Panel: Recent Labs  Lab 04/16/19 1921  04/16/19 2142 04/17/19 0547 04/17/19 0620 04/17/19 1254 04/17/19 1306 04/18/19 0253  NA 141   < > 140 140 145 142 146* 139  K 3.4*   < > 3.5 4.0 4.1 3.9 3.9 3.5  CL 105  --  107 110  --  112* 109 112*  CO2 24  --  20* 19*  --  21*  --  21*  GLUCOSE 278*  --  268* 251*  --  143* 136* 179*  BUN 22  --  23 27*  --  32* 34* 38*  CREATININE 1.44*  --  1.54* 1.80*  --  2.06* 1.90* 2.06*  CALCIUM 6.9*  --  7.4* 7.3*  --  7.5*  --  7.5*  MG  --   --   --  2.0  --  1.9  --   --   PHOS  --   --   --  1.3*  --   --   --   --    < > = values in this interval not displayed.    Liver Function Tests: Recent Labs  Lab 04/16/19 1335  AST 166*  ALT 211*  ALKPHOS 83  BILITOT 0.8  PROT 6.3*  ALBUMIN 3.5   No results for input(s): LIPASE, AMYLASE in the last 168 hours. No results for input(s): AMMONIA in the last 168 hours.  CBC: Recent Labs  Lab 04/16/19 1335  04/16/19 2102 04/17/19 0547 04/17/19 0620 04/17/19 1306 04/18/19 0253  WBC 27.5*  --   --  25.1*  --   --  27.3*  NEUTROABS 19.3*  --   --   --   --   --   --   HGB 13.8   < > 11.6* 11.0* 10.9* 9.2* 8.3*  HCT 42.9   < > 34.0* 33.2* 32.0* 27.0* 25.2*  MCV 100.2*  --   --  96.8  --   --  99.2  PLT 331  --   --  377  --   --  251   < > = values in this interval not displayed.    Cardiac Enzymes: No results for input(s): CKTOTAL, CKMB, CKMBINDEX, TROPONINI in the last 168 hours.  BNP: BNP (last 3 results) No results for input(s): BNP in the  last 8760 hours.  ProBNP (last 3 results)  No results for input(s): PROBNP in the last 8760 hours.   CBG: Recent Labs  Lab 04/18/19 0302 04/18/19 0355 04/18/19 0514 04/18/19 0603 04/18/19 0700  GLUCAP 156* 146* 150* 143* 136*    Coagulation Studies: Recent Labs    04/16/19 1335 04/16/19 1921  LABPROT 16.3* 19.6*  INR 1.3* 1.7*     Imaging   No results found.   Medications:     Current Medications: . aspirin  81 mg Oral Daily  . chlorhexidine gluconate (MEDLINE KIT)  15 mL Mouth Rinse BID  . Chlorhexidine Gluconate Cloth  6 each Topical Daily  . Chlorhexidine Gluconate Cloth  6 each Topical Q0600  . feeding supplement (PRO-STAT SUGAR FREE 64)  30 mL Per Tube BID  . fentaNYL (SUBLIMAZE) injection  25 mcg Intravenous Once  . heparin  4,000 Units Intravenous Once  . mouth rinse  15 mL Mouth Rinse 10 times per day  . mupirocin ointment  1 application Nasal BID  . pantoprazole (PROTONIX) IV  40 mg Intravenous Daily  . sodium chloride flush  3 mL Intravenous Q12H    Infusions: . sodium chloride 10 mL/hr at 04/18/19 0700  . amiodarone 30 mg/hr (04/18/19 0700)  . ceFEPime (MAXIPIME) IV Stopped (04/17/19 2138)  . epinephrine 3 mcg/min (04/18/19 0700)  . feeding supplement (VITAL AF 1.2 CAL) 1,000 mL (04/17/19 1722)  . fentaNYL infusion INTRAVENOUS 125 mcg/hr (04/18/19 0700)  . impella catheter heparin 50 unit/mL in dextrose 5%    . heparin 1,000 Units/hr (04/18/19 0700)  . insulin 3.4 mL/hr at 04/18/19 0700  . midazolam 6 mg/hr (04/18/19 0700)  . milrinone 0.2 mcg/kg/min (04/18/19 0700)  . norepinephrine (LEVOPHED) Adult infusion 11 mcg/min (04/18/19 0700)  . vancomycin Stopped (04/17/19 2301)    Assessment/Plan   1. Cardiac arrest/VF - due to anterior MI - approximately 20 mins downtime - continue amio 30 mg per hour - keep K > 4.0 Mg > 2.0 - given hemodynamic instability likely use normothermia - concern for anoxic brain injury. EEG ordered.   2. CAD with acute anterolateral STEMI - Cath 8/24  with 100% prox LAD. Unable to be opened with PCI - On heparin drip.  - ASA/Statin - no b-blocker with shock  3. Acute systolic HF with Cardiogenic shock  - due to #2 - Continue Impella support. Currently @ P-9 with 3.6 L flow. Waveforms look good - On Norepi 14 mcg + epi 4 mcg+ milrinone 0.25 mcg.  - CVP trending up. Given 20 mg IV lasix.   4. Acute hypoxic respiratory failure  - on vent  5. Hematuria - Off aggrastat. Resolved.   6. AKI  1.9>2.06  7. ID On Cefepime/Vanc Febrile  WBC 27.  Blood CX - NGTD  8. Anemia  Hgb trending down. Had hematuria but resolved today. Oozing around LIJ and R femoral line. Place thrombi pad.   ECHO this morning for impella placement.   -Length of Stay: 2  Darrick Grinder, NP  04/18/2019, 7:05 AM  Advanced Heart Failure Team Pager [phone] (M-F; 7a - 4p)  Please contact [facility] for night-coverage after hours (4p -7a ) and weekends on [email or web address]  Patient seen with NP, agree with the above note.   Stable this morning, urine clear.  LDH slightly lower.  Co-ox 65% and good cardiac output from [place]. CVP around 10.  Pressors as above. He remains on vancomycin/cefepime, WBCs 27.  Creatinine stable 2.06.  Some oozing  around lines but improved from yesterday.  Per nurse, he will follow some commands when sedation weaned.  There is some concern, however, for myoclonus overnight and EEG ordered.   Impella position assessed under echo guidance, 3.0 cm.  Not adjusted.  Flow 3.8 L/min at P9.   General: Intubated/sedated.  Neck: JVP difficult.  Lungs: Clear to auscultation bilaterally with normal respiratory effort. CV: Nondisplaced PMI.  Heart regular S1/S2, no S3/S4, no murmur.  1+ ankle edema.   Abdomen: Soft, nontender, no hepatosplenomegaly, no distention.  Skin: Intact without lesions or rashes.  Neurologic: Sedated.  Extremities: No clubbing or cyanosis.  HEENT: Normal.   1. CAD: Anterolateral STEMI, cath 8/24 showed occluded proximal  LAD, unable to open.  Once we see how he recovers from this initial event, will need to decide on feasibility of re-attempt PCI versus CABG.  - Continue ASA + statin.  - Heparin gtt for Impella.  - When creatinine is stabilized and if he has good neurologic recovery, will need to decide on PCI vs CABG => will likely need to be Impella-protected so may be best to leave Impella in for now.  2. Cardiogenic shock: Ischemic cardiomyopathy.  Echo with EF 25%, normal RV.  Currently supported with Impella at P9, epinephrine 4, norepinephine 14, milrinone 0.25.  CVP around 10, good cardiac output by co-ox and Swan.  Pressors have been weaned down from yesterday.  Impella position adequate by echo, flow ok.  LDH remains elevated but no rise and urine has cleared.  - Continue Impella at P9.  Pulses palpable.  Heparin gtt.  - Wean epinephrine and norepinephrine today, currently good BP.  - Continue milrinone 0.25 - Will give 1 dose of Lasix 20 mg IV.  3. AKI: Suspect hemodynamic.  Creatinine mildly higher today at 2.06.  Maintain cardiac output, hopefully this will improve.  4. Cardiac arrest/VF: 20 minutes downtime, ?component of anoxic brain injury.  Shivering noted yesterday.  - Plan for EEG today, neurology to see.  - continue amiodarone gtt.  5. ID: WBCs 27, on cefepime/vancomcyin for empiric coverage (?aspiration).  6. Anemia: Hgb low but stable.  Hematuria and line oozing improved.   CRITICAL CARE Performed by: Loralie Champagne  Total critical care time: 45 minutes  Critical care time was exclusive of separately billable procedures and treating other patients.  Critical care was necessary to treat or prevent imminent or life-threatening deterioration.  Critical care was time spent personally by me on the following activities: development of treatment plan with patient and/or surrogate as well as nursing, discussions with consultants, evaluation of patient's response to treatment, examination of  patient, obtaining history from patient or surrogate, ordering and performing treatments and interventions, ordering and review of laboratory studies, ordering and review of radiographic studies, pulse oximetry and re-evaluation of patient's condition.  Loralie Champagne 04/18/2019 9:03 AM

## 2019-04-18 NOTE — Progress Notes (Signed)
1141: Dr Aundra Dubin paged regarding continuous suction alarm on Impella. MD made aware that CVP was 9 and UOP was 700 from 20mg  of Lasix at 0800. MD also made aware of patient's neurological exam after sedation was cut off. MD ordered a 250 mL bolus and instructed RN to cut Impella down to P7.  1241: Dr Aundra Dubin paged regarding elevated PA pressures and CVP. MD ordered 40 of Lasix. Will continue to monitor closely.

## 2019-04-19 ENCOUNTER — Inpatient Hospital Stay (HOSPITAL_COMMUNITY): Payer: PPO

## 2019-04-19 ENCOUNTER — Other Ambulatory Visit: Payer: Self-pay

## 2019-04-19 DIAGNOSIS — Z95811 Presence of heart assist device: Secondary | ICD-10-CM

## 2019-04-19 DIAGNOSIS — I5021 Acute systolic (congestive) heart failure: Secondary | ICD-10-CM

## 2019-04-19 DIAGNOSIS — G931 Anoxic brain damage, not elsewhere classified: Secondary | ICD-10-CM

## 2019-04-19 DIAGNOSIS — J9600 Acute respiratory failure, unspecified whether with hypoxia or hypercapnia: Secondary | ICD-10-CM

## 2019-04-19 LAB — CBC
HCT: 19.9 % — ABNORMAL LOW (ref 39.0–52.0)
HCT: 23.6 % — ABNORMAL LOW (ref 39.0–52.0)
Hemoglobin: 6.5 g/dL — CL (ref 13.0–17.0)
Hemoglobin: 8 g/dL — ABNORMAL LOW (ref 13.0–17.0)
MCH: 32 pg (ref 26.0–34.0)
MCH: 31.5 pg (ref 26.0–34.0)
MCHC: 32.7 g/dL (ref 30.0–36.0)
MCHC: 33.9 g/dL (ref 30.0–36.0)
MCV: 98 fL (ref 80.0–100.0)
MCV: 92.9 fL (ref 80.0–100.0)
Platelets: 157 10*3/uL (ref 150–400)
Platelets: 138 10*3/uL — ABNORMAL LOW (ref 150–400)
RBC: 2.03 MIL/uL — ABNORMAL LOW (ref 4.22–5.81)
RBC: 2.54 MIL/uL — ABNORMAL LOW (ref 4.22–5.81)
RDW: 13.6 % (ref 11.5–15.5)
RDW: 15.7 % — ABNORMAL HIGH (ref 11.5–15.5)
WBC: 19.3 10*3/uL — ABNORMAL HIGH (ref 4.0–10.5)
WBC: 17 10*3/uL — ABNORMAL HIGH (ref 4.0–10.5)
nRBC: 0 % (ref 0.0–0.2)
nRBC: 0.2 % (ref 0.0–0.2)

## 2019-04-19 LAB — BASIC METABOLIC PANEL
Anion gap: 6 (ref 5–15)
BUN: 49 mg/dL — ABNORMAL HIGH (ref 8–23)
CO2: 23 mmol/L (ref 22–32)
Calcium: 7.7 mg/dL — ABNORMAL LOW (ref 8.9–10.3)
Chloride: 111 mmol/L (ref 98–111)
Creatinine, Ser: 2.09 mg/dL — ABNORMAL HIGH (ref 0.61–1.24)
GFR calc Af Amer: 37 mL/min — ABNORMAL LOW (ref >60)
GFR calc non Af Amer: 32 mL/min — ABNORMAL LOW (ref >60)
Glucose, Bld: 217 mg/dL — ABNORMAL HIGH (ref 70–99)
Potassium: 3.8 mmol/L (ref 3.5–5.1)
Sodium: 140 mmol/L (ref 135–145)

## 2019-04-19 LAB — COOXEMETRY PANEL
Carboxyhemoglobin: 0.6 % (ref 0.5–1.5)
Carboxyhemoglobin: 1.2 % (ref 0.5–1.5)
Carboxyhemoglobin: 1.6 % — ABNORMAL HIGH (ref 0.5–1.5)
Methemoglobin: 1.1 % (ref 0.0–1.5)
Methemoglobin: 1.5 % (ref 0.0–1.5)
Methemoglobin: 1.5 % (ref 0.0–1.5)
O2 Saturation: 93.4 %
O2 Saturation: 51 %
O2 Saturation: 66.3 %
Total hemoglobin: 6.8 g/dL — CL (ref 12.0–16.0)
Total hemoglobin: 9.8 g/dL — ABNORMAL LOW (ref 12.0–16.0)
Total hemoglobin: 7.9 g/dL — ABNORMAL LOW (ref 12.0–16.0)

## 2019-04-19 LAB — ECHOCARDIOGRAM LIMITED
Height: 66 in
Weight: 3456.81 oz

## 2019-04-19 LAB — GLUCOSE, CAPILLARY
Glucose-Capillary: 159 mg/dL — ABNORMAL HIGH (ref 70–99)
Glucose-Capillary: 161 mg/dL — ABNORMAL HIGH (ref 70–99)
Glucose-Capillary: 181 mg/dL — ABNORMAL HIGH (ref 70–99)
Glucose-Capillary: 197 mg/dL — ABNORMAL HIGH (ref 70–99)
Glucose-Capillary: 219 mg/dL — ABNORMAL HIGH (ref 70–99)
Glucose-Capillary: 236 mg/dL — ABNORMAL HIGH (ref 70–99)

## 2019-04-19 LAB — POCT ACTIVATED CLOTTING TIME
Activated Clotting Time: 158 seconds
Activated Clotting Time: 158 seconds
Activated Clotting Time: 158 seconds
Activated Clotting Time: 169 seconds
Activated Clotting Time: 164 seconds
Activated Clotting Time: 158 seconds
Activated Clotting Time: 164 seconds
Activated Clotting Time: 175 seconds

## 2019-04-19 LAB — HEMOGLOBIN A1C
Hgb A1c MFr Bld: 7.2 % — ABNORMAL HIGH (ref 4.8–5.6)
Mean Plasma Glucose: 159.94 mg/dL

## 2019-04-19 LAB — ABO/RH: ABO/RH(D): B NEG

## 2019-04-19 LAB — MAGNESIUM: Magnesium: 1.8 mg/dL (ref 1.7–2.4)

## 2019-04-19 LAB — LACTATE DEHYDROGENASE: LDH: 689 U/L — ABNORMAL HIGH (ref 98–192)

## 2019-04-19 LAB — PREPARE RBC (CROSSMATCH)

## 2019-04-19 MED ORDER — SODIUM CHLORIDE 0.9% IV SOLUTION
Freq: Once | INTRAVENOUS | Status: AC
  Administered 2019-04-19: 09:00:00 via INTRAVENOUS

## 2019-04-19 MED ORDER — FUROSEMIDE 10 MG/ML IJ SOLN
60.0000 mg | Freq: Once | INTRAMUSCULAR | Status: AC
  Administered 2019-04-19: 60 mg via INTRAVENOUS
  Filled 2019-04-19: qty 6

## 2019-04-19 MED ORDER — SODIUM CHLORIDE 0.9 % IV BOLUS
250.0000 mL | Freq: Once | INTRAVENOUS | Status: AC
  Administered 2019-04-19: 250 mL via INTRAVENOUS

## 2019-04-19 MED ORDER — MILRINONE LACTATE IN DEXTROSE 20-5 MG/100ML-% IV SOLN
0.1250 ug/kg/min | INTRAVENOUS | Status: DC
  Administered 2019-04-19 – 2019-04-28 (×18): 0.25 ug/kg/min via INTRAVENOUS
  Administered 2019-04-28: 17:00:00 0.125 ug/kg/min via INTRAVENOUS
  Filled 2019-04-19 (×17): qty 100

## 2019-04-19 MED ORDER — POTASSIUM CHLORIDE 20 MEQ PO PACK
40.0000 meq | PACK | Freq: Once | ORAL | Status: AC
  Administered 2019-04-19: 09:00:00 40 meq
  Filled 2019-04-19: qty 2

## 2019-04-19 MED ORDER — FUROSEMIDE 10 MG/ML IJ SOLN: 40.0000 mg | Freq: Once | INTRAMUSCULAR | Status: DC

## 2019-04-19 MED ORDER — MAGNESIUM SULFATE 2 GM/50ML IV SOLN
2.0000 g | Freq: Once | INTRAVENOUS | Status: AC
  Administered 2019-04-19: 2 g via INTRAVENOUS
  Filled 2019-04-19: qty 50

## 2019-04-19 MED ORDER — "THROMBI-PAD 3""X3"" EX PADS"
1.0000 | MEDICATED_PAD | Freq: Once | CUTANEOUS | Status: AC
  Administered 2019-04-19: 1 via TOPICAL
  Filled 2019-04-19: qty 1

## 2019-04-19 NOTE — Procedures (Signed)
Patient Name: Tuck Dulworth  [name] [id number]  Epilepsy Attending: Lora Havens  Referring Physician/Provider: Dr Kerney Elbe Date: 04/19/2019  Duration: 24.38 mins  Patient history: 68 year old male with cardiac arrest.  EEG ordered to evaluate for seizures.  Level of alertness: Obtunded  AEDs during EEG study: None  Technical aspects: This EEG study was done with scalp electrodes positioned according to the 10-20 International system of electrode placement. Electrical activity was acquired at a sampling rate of 500Hz  and reviewed with a high frequency filter of 70Hz  and a low frequency filter of 1Hz . EEG data were recorded continuously and digitally stored.   Description: EEG showed continuous generalized 2-6 5 Hz theta delta slowing.  EEG was reactive to stimulation.  Hyperventilation and photic stimulation were not performed.  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  No seizures or epileptiform discharges were seen throughout the recording.      Javoni Lucken Barbra Sarks

## 2019-04-19 NOTE — Progress Notes (Signed)
Patient ID: Lawrence Olson, male   DOB: [birth year], 68 y.o.   [name] [id number]  Impella position viewed under echo, appeared short at 1.9 cm.  Impella was advanced under echo guidance to 3.4 cm.  It appeared to be in good position, no alarms and steady flow.   Loralie Champagne 04/19/2019 7:56 AM

## 2019-04-19 NOTE — Progress Notes (Signed)
EEG completed, results pending. 

## 2019-04-19 NOTE — Progress Notes (Signed)
Decreased the patient's Impella  P level from P7 to P5 due to suction alarm notification. Assessed catheter position. Catheter remains at 57.Patient's CVP is 10. Total shift urine output is 666ml. Will continue to monitor for suction alarms. If suctions alarms continues RN will notify Cardiology.

## 2019-04-19 NOTE — Progress Notes (Signed)
NAMEConn Olson, [name]  [id number], DOB:  June 15, 1951, LOS: 0 ADMISSION DATE:  04/16/2019, CONSULTATION DATE:  04/16/2019 REFERRING MD:  Lawrence Olson CHIEF COMPLAINT:  STEMI, resp failure  Brief History   68yo M out of hospital cardiac arrest with ROSC, intubated, found to have STEMI, taken emergently to cath lab. Proximal LAD lesion. PCCM consulted for management assistance.   Past Medical History  Unknown  Significant Hospital Events   8/24: Cath Lab. PTCI unsuccessful despite multiple attempts.  RA 13 RV 42/16 PA 40/26 (31) PCWP 18 Pa sat 50%  Fick 2.7/1.3 CPO 0.6 Impella placed. VT /p same requiring defib.  8/27: Remains comatose with decorticate posturing.  Still on Impella support.  Weaning vasoactive drips.  At this point major question is neurological recovery.  Would need further cardiac intervention if he were to recover neurologically.  Neurology has been consulted Consults:  Cardiology PCCM Heart failure   Procedures:  8/24: PTCI unsuccessful 8/14: Impella L groin>> 8/24: 7.5 ETT>> 8/24: Right femoral venous sheath with PA cath>> 8/24: Right femoral arterial sheath>> 8/24: Left femoral arterial sheath>> 8/24: L IJ CVC >>>  Significant Diagnostic Tests:    Micro Data:  COVID-19: Negative 8/24 blood cultures x2>>> Urine culture 8/27:>>>  Antimicrobials:  Vancomycin and cefepime from 8/25 through 826  Interim history/subjective:  Impella P6 Continue to wean vasoactive drips Decorticate posturing  Objective    Today's Vitals   04/19/19 0630 04/19/19 0700 04/19/19 0730 04/19/19 0800  BP:  99/63 114/62 107/64  Pulse:  84 89   Resp:  (Abnormal) 21 (Abnormal) 21 (Abnormal) 21  Temp: 99 F (37.2 C) 99.1 F (37.3 C)  99.5 F (37.5 C)  TempSrc:      SpO2:  97% 98%   Weight:      Height:      PainSc:       Vent Mode: PRVC FiO2 (%):  [40 %-50 %] 40 % Set Rate:  [21 bmp] 21 bmp Vt Set:  [510 mL] 510 mL PEEP:  [5 cmH20] 5 cmH20 Plateau  Pressure:  [17 cmH20-23 cmH20] 23 cmH20   Examination:  General 68 year old white male currently withdraws to pain otherwise unresponsive HEENT sclerae icteric scleral edema orally intubated no JVD mucous membranes moist Pulmonary: Coarse scattered rhonchi Cardiac: Regular rhythm Abdomen: Soft nontender Extremities: Trace edema warm to palpation Neuro: Withdraws to pain, decorticate posturing noted.  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  Patient was taken to the cath lab and found to have 100% proximal LAD lesion. PCI unsuccessful so impella placed.    Acute systolic heart failure and cardiogenic shock status post cardiac arrest/ VF- r/t anterior MI, CAD with acute STEMI - 100% prox LAD - PCI unsuccessful  -shock state improving  Plan Continuing to wean vasoactive drips Inotropic support per heart failure team Impella support per heart failure team Daily assessment for diuresis Cont asa and statin  Cont amiodarone   Acute hypoxic respiratory failure - r/t MI, cardiac arrest, cardiogenic shock Portable chest x-ray personally reviewed:Endotracheal tubes in satisfactory position.  PA catheter in satisfactory position.  Right greater than  left interstitial prominence appears essentially unchanged  Plan Full ventilator support PAD protocol VAP bundle Not a candidate for weaning at this point given #1 poor mental status and #2 hemodynamic instability Repeat x-ray in a.m.   Fever  -Cultures all negative for now, antibiotics stopped 8/26 Plan Treat fever given probable hypoxic injury Checking urine culture given hematuria  AKI - s/p cardiac arrest   Plan Continue CRRT Serial chemistries Ensure adequate endorgan perfusion Strict intake output  Anemia of critical illness, continues to have intermittent hematuria - aggrastat off  -on heparin gtt for impella Plan Transfuse and follow CBC  Mildly elevated LFTs with icteric sclera trending down Plan Continue to trend LFTs for now will defer ultrasound imaging at this point given poor neurological exam  Acute encephalopathy - post cardiac arrest. On further questioning it appears as though time to review of spontaneous circulation may have been longer than initially felt.  He opens his eyes however this seems to be spontaneously, and is exhibiting decorticate posturing raising concern for anoxic injury certainly nonpurposeful Plan Continue supportive care PAD protocol with RASS goal of 0 to -1 Will obtain EEG Would benefit from further neurodiagnostic imaging such as MRI at this point however will need to wait until he is off Impella  Hyperglycemia Plan Add sliding scale insulin   Best practice Sedation: PAD protocol Nutrition: tubefeeds SUP: H2B DVT prophylaxis: IV heparin Glycemic control: SSI   Ccm time 35 minutes  Lawrence Olson ACNP-BC Lawrence Olson Pager # [phone] OR # [phone] if no answer

## 2019-04-19 NOTE — Progress Notes (Addendum)
Nutrition Follow-up  DOCUMENTATION CODES:   Not applicable  INTERVENTION:   Continue tube feeding:  -Vital AF 1.2 @ 20 ml/hr 60 ml/hr (1440 ml) via OGT -30 ml Prostat BID  At goal TF provides: 1928 kcals, 138 grams protein, 1097 ml free water. Meets 100% of needs.   NUTRITION DIAGNOSIS:   Increased nutrient needs related to post-op healing as evidenced by estimated needs.  Ongoing  GOAL:   Patient will meet greater than or equal to 90% of their needs  Met via TF  MONITOR:   Diet advancement, Vent status, Weight trends, I & O's, TF tolerance, Skin  REASON FOR ASSESSMENT:   Ventilator    ASSESSMENT:   Patient with unknown PMH. Presents this admission with cardiac arrest due to STEMI.   8/24- L groin impella placed, PCI unsuccessful   Pt discussed during ICU rounds and with RN.  Pressors being weaned. Concern for anoxic brain injury- MRI once off impella. CBGs elevated- SS insulin added this am. Tolerating Vital AF 1.2 @ 60 ml/hr- continue current interventions.   Admission weight: 88.4 kg Current weight: 98 kg   Patient remains intubated on ventilator support MV: 10.2 L/min Temp (24hrs), Avg:99.5 F (37.5 C), Min:98.8 F (37.1 C), Max:100 F (37.8 C)   I/O: +4,921 ml since admit UOP: 2,795 ml x 24 hrs    Drips: amiodarone, milrinone Medication: SS novolog Labs: Cr 2.09- increased slightly CBG 77-310 BUN 49- trending up  NUTRITION - FOCUSED PHYSICAL EXAM:    Most Recent Value  Orbital Region  No depletion  Upper Arm Region  No depletion  Thoracic and Lumbar Region  Unable to assess  Buccal Region  No depletion  Temple Region  No depletion  Clavicle Bone Region  No depletion  Clavicle and Acromion Bone Region  No depletion  Scapular Bone Region  Unable to assess  Dorsal Hand  No depletion  Patellar Region  No depletion  Anterior Thigh Region  No depletion  Posterior Calf Region  Unable to assess  Edema (RD Assessment)  Mild  Hair  Reviewed   Eyes  Reviewed  Mouth  Unable to assess  Skin  Reviewed  Nails  Reviewed     Diet Order:   Diet Order            Diet NPO time specified  Diet effective now              EDUCATION NEEDS:   Not appropriate for education at this time  Skin:  Skin Assessment: Reviewed RN Assessment  Last BM:  PTA  Height:   Ht Readings from Last 1 Encounters:  04/16/19 5' 6" (1.676 m)    Weight:   Wt Readings from Last 1 Encounters:  04/19/19 98 kg    Ideal Body Weight:  64.5 kg  BMI:  Body mass index is 34.87 kg/m.  Estimated Nutritional Needs:   Kcal:  1902 kcal  Protein:  129-149 grams  Fluid:  >/= 1.9 L/day   Mariana Single RD, LDN Clinical Nutrition Pager # - [phone]

## 2019-04-19 NOTE — Progress Notes (Signed)
Patient ID: Lawrence Olson, male   DOB: [birth year], 68 y.o.   [name] [id number]    Advanced Heart Failure Team  Note   Primary Physician: Patient, No Pcp Per PCP-Cardiologist:  No primary care provider on file.  Reason for Consultation: Cardiogenic shock  HPI:    Remains sedated on vent. This morning, he appears to be posturing.   Yesterday, able to wean down pressors but suction alarms in the afternoon, resolved after Impella decreased to P6.  Currently, flow 3.1 L/min at P6.    He is off epinephrine.  He continues on norepinephrine 4, Milrinone 0.2 mcg. He continues on heparin gtt and amiodarone gtt.  Minimal oozing around lines.   Urine more tea-colored just starting around 6 am. LDH lower, 1200 => 689.   Hgb 6.5 this morning with WBCs down to 19.   CCM stopped abx yesterday.  Tm 100.   Impella P6 3.1 L/min Swan #s  RA 14-15 PA 40/19  Fick CI 4.3  SVR 730  Co-ox  51%     Objective:    Vital Signs:   Temp:  [98.6 F (37 C)-100 F (37.8 C)] 99.1 F (37.3 C) (08/27 0700) Pulse Rate:  [80-102] 84 (08/27 0700) Resp:  [15-23] 21 (08/27 0700) BP: (84-131)/(53-86) 99/63 (08/27 0700) SpO2:  [95 %-100 %] 97 % (08/27 0700) Arterial Line BP: (86-137)/(48-75) 101/60 (08/27 0700) FiO2 (%):  [40 %-50 %] 40 % (08/27 0338) Weight:  [98 kg] 98 kg (08/27 0500) Last BM Date: (PTA)  Weight change: Filed Weights   04/17/19 0500 04/18/19 0500 04/19/19 0500  Weight: 91.3 kg 100 kg 98 kg    Intake/Output:   Intake/Output Summary (Last 24 hours) at 04/19/2019 0721 Last data filed at 04/19/2019 0700 Gross per 24 hour  Intake 3289.65 ml  Output 2795 ml  Net 494.65 ml      Physical Exam    General: Intubated/sedated.  Neck: JVP 10-12 cm, no thyromegaly or thyroid nodule.  Lungs: Coarse BS bilaterally.  CV: Nondisplaced PMI.  Heart regular S1/S2, no S3/S4, no murmur.  1+ ankle edema.   Abdomen: Soft, nontender, no hepatosplenomegaly, no distention.  Skin: Intact without  lesions or rashes.  Neurologic: No purposeful response, noted to be posturing.  Extremities: No clubbing or cyanosis.  HEENT: Normal.    Telemetry   NSR 90s with PVCs   Labs   Basic Metabolic Panel: Recent Labs  Lab 04/16/19 2142 04/17/19 0547 04/17/19 0620 04/17/19 1254 04/17/19 1306 04/18/19 0253 04/19/19 0359  NA 140 140 145 142 146* 139 140  K 3.5 4.0 4.1 3.9 3.9 3.5 3.8  CL 107 110  --  112* 109 112* 111  CO2 20* 19*  --  21*  --  21* 23  GLUCOSE 268* 251*  --  143* 136* 179* 217*  BUN 23 27*  --  32* 34* 38* 49*  CREATININE 1.54* 1.80*  --  2.06* 1.90* 2.06* 2.09*  CALCIUM 7.4* 7.3*  --  7.5*  --  7.5* 7.7*  MG  --  2.0  --  1.9  --   --  1.8  PHOS  --  1.3*  --   --   --   --   --     Liver Function Tests: Recent Labs  Lab 04/16/19 1335 04/18/19 0946  AST 166* 102*  ALT 211* 85*  ALKPHOS 83 39  BILITOT 0.8 1.4*  PROT 6.3* 4.6*  ALBUMIN 3.5 2.3*   No results for  input(s): LIPASE, AMYLASE in the last 168 hours. No results for input(s): AMMONIA in the last 168 hours.  CBC: Recent Labs  Lab 04/16/19 1335  04/17/19 0547 04/17/19 0620 04/17/19 1306 04/18/19 0253 04/19/19 0509  WBC 27.5*  --  25.1*  --   --  27.3* 19.3*  NEUTROABS 19.3*  --   --   --   --   --   --   HGB 13.8   < > 11.0* 10.9* 9.2* 8.3* 6.5*  HCT 42.9   < > 33.2* 32.0* 27.0* 25.2* 19.9*  MCV 100.2*  --  96.8  --   --  99.2 98.0  PLT 331  --  377  --   --  251 157   < > = values in this interval not displayed.    Cardiac Enzymes: No results for input(s): CKTOTAL, CKMB, CKMBINDEX, TROPONINI in the last 168 hours.  BNP: BNP (last 3 results) No results for input(s): BNP in the last 8760 hours.  ProBNP (last 3 results) No results for input(s): PROBNP in the last 8760 hours.   CBG: Recent Labs  Lab 04/18/19 1204 04/18/19 1600 04/18/19 2001 04/19/19 0006 04/19/19 0329  GLUCAP 118* 157* 161* 181* 236*    Coagulation Studies: Recent Labs    04/16/19 1335 04/16/19  1921  LABPROT 16.3* 19.6*  INR 1.3* 1.7*     Imaging   No results found.   Medications:     Current Medications: . aspirin  81 mg Oral Daily  . atorvastatin  80 mg Per Tube q1800  . chlorhexidine gluconate (MEDLINE KIT)  15 mL Mouth Rinse BID  . Chlorhexidine Gluconate Cloth  6 each Topical Daily  . famotidine  20 mg Per Tube Daily  . feeding supplement (PRO-STAT SUGAR FREE 64)  30 mL Per Tube BID  . fentaNYL (SUBLIMAZE) injection  25 mcg Intravenous Once  . furosemide  40 mg Intravenous Once  . heparin  4,000 Units Intravenous Once  . insulin aspart  2-6 Units Subcutaneous Q4H  . insulin aspart  4 Units Subcutaneous Q4H  . mouth rinse  15 mL Mouth Rinse 10 times per day  . mupirocin ointment  1 application Nasal BID  . potassium chloride  40 mEq Per Tube Once  . sodium chloride flush  3 mL Intravenous Q12H    Infusions: . sodium chloride Stopped (04/19/19 0030)  . amiodarone 30 mg/hr (04/19/19 0700)  . feeding supplement (VITAL AF 1.2 CAL) 1,000 mL (04/19/19 0230)  . fentaNYL infusion INTRAVENOUS 150 mcg/hr (04/19/19 0700)  . impella catheter heparin 50 unit/mL in dextrose 5%    . heparin 1,400 Units/hr (04/19/19 0700)  . magnesium sulfate bolus IVPB    . milrinone 0.2 mcg/kg/min (04/19/19 0700)  . norepinephrine (LEVOPHED) Adult infusion 5 mcg/min (04/19/19 0700)    Assessment/Plan   1. CAD: Anterolateral STEMI, cath 8/24 showed occluded proximal LAD, unable to open.  Once we see how he recovers from this initial event, will need to decide on feasibility of re-attempt PCI versus CABG.  - Continue ASA + statin.  - Heparin gtt for Impella.  - When creatinine is stabilized and if he has good neurologic recovery, will need to decide on PCI vs CABG => will likely need to be Impella-protected so may be best to leave Impella in for now.  2. Cardiogenic shock: Ischemic cardiomyopathy.  Echo with EF 25%, normal RV.  Currently supported with Impella at P6, norepinephine 4,  milrinone 0.2.  CVP  around 14.  Good cardiac output by Luiz Blare but co-ox lower at 51%.  This may be due to low hemoglobin this morning.  Pressors have been weaned down from yesterday and he is off epinephrine.  He had some suction events on Impella overnight, resolved when speed decreased to P6 (flow 3.1).  LDH dropped from 1200 to 689 but urine more tea-colored this morning.  I/Os mildly positive, BUN higher but creatinine stable at 2.09.  - Continue Impella at P6 for now.  Pulses palpable.  Heparin gtt.  Will check device position under echo.  - Increase milrinone to 0.25 for now.   - Ok to wean norepinephrine down further as able.  - Will give a dose of Lasix 60 mg IV between units of blood.  3. AKI: Suspect hemodynamic.  Creatinine stable today at 2.09 but BUN higher.  Maintain cardiac output, hopefully this will improve.  4. Cardiac arrest/VF: 20 minutes downtime, concerned for anoxic brain injury.  He appears to be posturing today.  - Will need EEG today, ask neurology to see.  - continue amiodarone gtt.  5. ID: WBCs down to 19, abx (vanco/cefepime) stopped yesterday.  Tm 100.  6. Anemia: Hgb down to 6.5.  - Transfuse 2 units PRBCs this morning.   CRITICAL CARE Performed by: Loralie Champagne  Total critical care time: 45 minutes  Critical care time was exclusive of separately billable procedures and treating other patients.  Critical care was necessary to treat or prevent imminent or life-threatening deterioration.  Critical care was time spent personally by me on the following activities: development of treatment plan with patient and/or surrogate as well as nursing, discussions with consultants, evaluation of patient's response to treatment, examination of patient, obtaining history from patient or surrogate, ordering and performing treatments and interventions, ordering and review of laboratory studies, ordering and review of radiographic studies, pulse oximetry and re-evaluation of  patient's condition.  Loralie Champagne 04/19/2019 7:21 AM

## 2019-04-19 NOTE — Progress Notes (Signed)
Pt had suction alarm. Decreased the Impella from P6 to P5. Issue resolved. Catheter placement remains the same. Will continue to monitor.

## 2019-04-19 NOTE — Consult Note (Addendum)
NEURO HOSPITALIST CONSULT NOTE   Requestig physician: Dr. Claiborne Billings  Reason for Consult: Anoxic brain injury  History obtained from:   Chart    HPI:                                                                                                                                          Lawrence Olson is an 68 y.o. male who was admitted on 8/24 for management of cardiac arrest. He had collapsed at work with immediate bystander CPR performed. After EMS arrival approximately 20 minutes CPR by EMS. He had been found to be in v-fib. He developed episodic whole body shaking movements on 8/26 suspicious for shivering versus atypical seizures. Opiates were initiated for control of the shivering-like movements. On 8/27, he appeared to be posturing. Neurology was consulted for assessment as well as for EEG.   Current problem list includes cardiac arrest/VF, CAD with acute anterolateral STEMI, acute systolic HF with cardiogenic shock, s/p Impella heart pump placement, acute hypoxic respiratory failure, and probable anoxic brain injury complicated by posturing.   No past medical history on file.  Past Surgical History:  Procedure Laterality Date  . RIGHT HEART CATH AND CORONARY ANGIOGRAPHY N/A 04/16/2019   Procedure: RIGHT HEART CATH AND CORONARY ANGIOGRAPHY;  Surgeon: Troy Sine, MD;  Location: [facility];  Service: Cardiovascular;  Laterality: N/A;  . VENTRICULAR ASSIST DEVICE INSERTION N/A 04/16/2019   Procedure: VENTRICULAR ASSIST DEVICE INSERTION;  Surgeon: Troy Sine, MD;  Location: [facility];  Service: Cardiovascular;  Laterality: N/A;    No family history on file.            Social History:  reports that he has quit smoking. He has never used smokeless tobacco. He reports current alcohol use. No history on file for drug.  No Known Allergies  MEDICATIONS:                                                                                                                      Scheduled: . sodium chloride   Intravenous Once  . aspirin  81 mg Oral Daily  . atorvastatin  80 mg Per Tube q1800  . chlorhexidine gluconate (MEDLINE KIT)  15 mL Mouth Rinse BID  . Chlorhexidine  Gluconate Cloth  6 each Topical Daily  . famotidine  20 mg Per Tube Daily  . feeding supplement (PRO-STAT SUGAR FREE 64)  30 mL Per Tube BID  . fentaNYL (SUBLIMAZE) injection  25 mcg Intravenous Once  . furosemide  60 mg Intravenous Once  . heparin  4,000 Units Intravenous Once  . insulin aspart  2-6 Units Subcutaneous Q4H  . insulin aspart  4 Units Subcutaneous Q4H  . mouth rinse  15 mL Mouth Rinse 10 times per day  . mupirocin ointment  1 application Nasal BID  . potassium chloride  40 mEq Per Tube Once  . sodium chloride flush  3 mL Intravenous Q12H   Continuous: . sodium chloride Stopped (04/19/19 0030)  . amiodarone 30 mg/hr (04/19/19 0800)  . feeding supplement (VITAL AF 1.2 CAL) 1,000 mL (04/19/19 0230)  . fentaNYL infusion INTRAVENOUS 150 mcg/hr (04/19/19 0800)  . impella catheter heparin 50 unit/mL in dextrose 5%    . heparin 1,400 Units/hr (04/19/19 0834)  . magnesium sulfate bolus IVPB    . milrinone    . norepinephrine (LEVOPHED) Adult infusion 4 mcg/min (04/19/19 0800)     ROS:                                                                                                                                       Unable to obtain due to unresponsiveness.    Blood pressure 107/64, pulse 89, temperature 99.5 F (37.5 C), resp. rate (!) 21, height '5\' 6"'  (1.676 m), weight 98 kg, SpO2 98 %.   General Examination:                                                                                                       Physical Exam  HEENT-  Normocephalic, no lesions, without obvious abnormality.  Normal external eye and conjunctiva.   Extremities- Warm, dry and intact Musculoskeletal- deformity or swelling Skin-warm and dry, no hyperpigmentation, vitiligo, or  suspicious lesions  Neurological Examination Mental Status: Patient does not respond to verbal stimuli.  Does not respond to deep sternal rub.  Does not follow commands.  No verbalizations noted. Intubated and breathing with vent at times over vent Cranial Nerves: II: patient does not respond to confrontation bilaterally,  III,IV,VI: doll's response present bilaterally. pupils right 2 mm, left 2 mm,and reactive bilaterally V,VII: corneal reflex present bilaterally  VIII: patient does not respond to verbal stimuli IX,X: gag reflex present, XI: trapezius strength unable to test bilaterally  XII: tongue strength unable to test Motor: Extremities flaccid throughout.  Decorticate posturing with sternal rub Sensory: Does not respond to noxious stimuli in any extremity. Deep Tendon Reflexes:  2+ in LE and 3+ UE Plantars: absent bilaterally Cerebellar: Unable to perform   Lab Results: Basic Metabolic Panel: Recent Labs  Lab 04/16/19 2142 04/17/19 0547 04/17/19 0620 04/17/19 1254 04/17/19 1306 04/18/19 0253 04/19/19 0359  NA 140 140 145 142 146* 139 140  K 3.5 4.0 4.1 3.9 3.9 3.5 3.8  CL 107 110  --  112* 109 112* 111  CO2 20* 19*  --  21*  --  21* 23  GLUCOSE 268* 251*  --  143* 136* 179* 217*  BUN 23 27*  --  32* 34* 38* 49*  CREATININE 1.54* 1.80*  --  2.06* 1.90* 2.06* 2.09*  CALCIUM 7.4* 7.3*  --  7.5*  --  7.5* 7.7*  MG  --  2.0  --  1.9  --   --  1.8  PHOS  --  1.3*  --   --   --   --   --     CBC: Recent Labs  Lab 04/16/19 1335  04/17/19 0547 04/17/19 0620 04/17/19 1306 04/18/19 0253 04/19/19 0509  WBC 27.5*  --  25.1*  --   --  27.3* 19.3*  NEUTROABS 19.3*  --   --   --   --   --   --   HGB 13.8   < > 11.0* 10.9* 9.2* 8.3* 6.5*  HCT 42.9   < > 33.2* 32.0* 27.0* 25.2* 19.9*  MCV 100.2*  --  96.8  --   --  99.2 98.0  PLT 331  --  377  --   --  251 157   < > = values in this interval not displayed.    Cardiac Enzymes: No results for input(s): CKTOTAL,  CKMB, CKMBINDEX, TROPONINI in the last 168 hours.  Lipid Panel: Recent Labs  Lab 04/16/19 1335  CHOL 167  TRIG 99  HDL 45  CHOLHDL 3.7  VLDL 20  LDLCALC 102*    Imaging: Dg Chest Port 1 View  Result Date: 04/18/2019 CLINICAL DATA:  Respiratory failure EXAM: PORTABLE CHEST 1 VIEW COMPARISON:  04/17/2019 FINDINGS: Unchanged AP portable examination with cardiomegaly and diffuse bilateral interstitial pulmonary opacity, likely edema. No new airspace opacity. An inferior approach pulmonary vascular catheter is now repositioned with tip over the left pulmonary artery. Otherwise unchanged support apparatus including endotracheal tube, left neck vascular catheter, and Impella device. IMPRESSION: Unchanged AP portable examination with cardiomegaly and diffuse bilateral interstitial pulmonary opacity, likely edema. No new airspace opacity. An inferior approach pulmonary vascular catheter is now repositioned with tip over the left pulmonary artery. Otherwise unchanged support apparatus including endotracheal tube, left neck vascular catheter, and Impella device. Electronically Signed   By: Eddie Candle M.D.   On: 04/18/2019 08:34    Assessment: 68 year old male with probable anoxic brain injury after VF cardiac arrest with total downtime > 20 minutes.  1. Exam reveals intact brain stem reflexes but no evidence for cortical function. Flexor posturing to noxious stimuli noted.  2. EEG report: This study is suggestive of moderate to severe diffuse encephalopathy, non specific to etiology.  No seizures or epileptiform discharges were seen throughout the recording.  Recommendations: 1. No strong indication for anticonvulsant based on negative EEG, but given high risk of development of seizures in anoxic brain injury, will start Keppra 500  mg BID. He has decreased renal function and if seizures occur on Keppra at 500 mg BID, would add Vimpat rather than increasing Keppra. Transaminases are elevated,  therefore VPA or Dilantin are not optimal choices.  2. CT head when stable.  50 minutes spent in the neurological evaluation and management of this critically ill patient.   Electronically signed: Dr. Kerney Elbe 04/19/2019, 8:22 AM

## 2019-04-19 NOTE — Progress Notes (Signed)
Dr. Aundra Dubin made aware of the patient's critical of  hgb 6.5.Marland Kitchen

## 2019-04-19 NOTE — Progress Notes (Signed)
ANTICOAGULATION CONSULT NOTE - Follow Up Consult  Pharmacy Consult for Heparin Indication: Impella, ACS  No Known Allergies  Patient Measurements: Height: 5\' 6"  (167.6 cm) Weight: 216 lb 0.8 oz (98 kg) IBW/kg (Calculated) : 63.8 Heparin Dosing Weight: 82.3  Vital Signs: Temp: 99.1 F (37.3 C) (08/27 0700) Temp Source: Core (08/27 0000) BP: 99/63 (08/27 0700) Pulse Rate: 84 (08/27 0700)  Labs: Recent Labs    04/16/19 1335  04/16/19 1921  04/17/19 0547  04/17/19 1306 04/18/19 0253 04/19/19 0359 04/19/19 0509  HGB 13.8   < >  --    < > 11.0*   < > 9.2* 8.3*  --  6.5*  HCT 42.9   < >  --    < > 33.2*   < > 27.0* 25.2*  --  19.9*  PLT 331  --   --   --  377  --   --  251  --  157  APTT 36  --  52*  --   --   --   --   --   --   --   LABPROT 16.3*  --  19.6*  --   --   --   --   --   --   --   INR 1.3*  --  1.7*  --   --   --   --   --   --   --   CREATININE 1.45*   < > 1.44*   < > 1.80*   < > 1.90* 2.06* 2.09*  --   TROPONINIHS 226*  --  83,151*  --   --   --   --  9,611*  --   --    < > = values in this interval not displayed.    Estimated Creatinine Clearance: 37.1 mL/min (A) (by C-G formula based on SCr of 2.09 mg/dL (H)).   Medical History: No past medical history on file.  Medications:  Infusions:  . sodium chloride Stopped (04/19/19 0030)  . amiodarone 30 mg/hr (04/19/19 0700)  . feeding supplement (VITAL AF 1.2 CAL) 1,000 mL (04/19/19 0230)  . fentaNYL infusion INTRAVENOUS 150 mcg/hr (04/19/19 0700)  . impella catheter heparin 50 unit/mL in dextrose 5%    . heparin 1,400 Units/hr (04/19/19 0700)  . magnesium sulfate bolus IVPB    . milrinone    . norepinephrine (LEVOPHED) Adult infusion 5 mcg/min (04/19/19 0700)    Assessment: 68 yo male admitted with STEMI, Impella placed in cath lab.  Heparin currently in purge solution is running at 11 mL/hr (555 units/hr) and systemic heparin is running at 14 mL/hr (1400 units/hr). ACT therapeutic at 169.    Hematuria has resolved and oozing around LIJ site and R femoral line have both resolved.  Hgb down to 6.5 today. Pltc remains wnl. Receiving 2 units of PRBC today. No obvious sites of bleeding noted.  Goal of Therapy:  ACT goal 160-180 Monitor platelets by anticoagulation protocol: Yes   Plan:  Continue heparin in purge solution and systemic heparin. RN to titrate systemic heparin per protocol to goal ACT. Monitor for bleeding.  Vertis Kelch, PharmD PGY2 Cardiology Pharmacy Resident Phone [phone] 04/19/2019       7:25 AM  Please check [email or web address] for unit-specific pharmacist phone numbers

## 2019-04-20 ENCOUNTER — Inpatient Hospital Stay (HOSPITAL_COMMUNITY): Payer: PPO

## 2019-04-20 ENCOUNTER — Encounter (HOSPITAL_COMMUNITY): Payer: Self-pay

## 2019-04-20 LAB — BASIC METABOLIC PANEL
Anion gap: 6 (ref 5–15)
BUN: 51 mg/dL — ABNORMAL HIGH (ref 8–23)
CO2: 25 mmol/L (ref 22–32)
Calcium: 8 mg/dL — ABNORMAL LOW (ref 8.9–10.3)
Chloride: 107 mmol/L (ref 98–111)
Creatinine, Ser: 1.82 mg/dL — ABNORMAL HIGH (ref 0.61–1.24)
GFR calc Af Amer: 43 mL/min — ABNORMAL LOW (ref >60)
GFR calc non Af Amer: 37 mL/min — ABNORMAL LOW (ref >60)
Glucose, Bld: 267 mg/dL — ABNORMAL HIGH (ref 70–99)
Potassium: 3.7 mmol/L (ref 3.5–5.1)
Sodium: 138 mmol/L (ref 135–145)

## 2019-04-20 LAB — CBC
HCT: 22.6 % — ABNORMAL LOW (ref 39.0–52.0)
HCT: 21.6 % — ABNORMAL LOW (ref 39.0–52.0)
Hemoglobin: 7.4 g/dL — ABNORMAL LOW (ref 13.0–17.0)
Hemoglobin: 7.3 g/dL — ABNORMAL LOW (ref 13.0–17.0)
MCH: 31.2 pg (ref 26.0–34.0)
MCH: 31.7 pg (ref 26.0–34.0)
MCHC: 32.7 g/dL (ref 30.0–36.0)
MCHC: 33.8 g/dL (ref 30.0–36.0)
MCV: 95.4 fL (ref 80.0–100.0)
MCV: 93.9 fL (ref 80.0–100.0)
Platelets: 124 10*3/uL — ABNORMAL LOW (ref 150–400)
Platelets: 132 10*3/uL — ABNORMAL LOW (ref 150–400)
RBC: 2.37 MIL/uL — ABNORMAL LOW (ref 4.22–5.81)
RBC: 2.3 MIL/uL — ABNORMAL LOW (ref 4.22–5.81)
RDW: 16.5 % — ABNORMAL HIGH (ref 11.5–15.5)
RDW: 16.4 % — ABNORMAL HIGH (ref 11.5–15.5)
WBC: 16 10*3/uL — ABNORMAL HIGH (ref 4.0–10.5)
WBC: 13.5 10*3/uL — ABNORMAL HIGH (ref 4.0–10.5)
nRBC: 0.3 % — ABNORMAL HIGH (ref 0.0–0.2)
nRBC: 0.4 % — ABNORMAL HIGH (ref 0.0–0.2)

## 2019-04-20 LAB — POCT ACTIVATED CLOTTING TIME
Activated Clotting Time: 158 seconds
Activated Clotting Time: 164 seconds
Activated Clotting Time: 164 seconds
Activated Clotting Time: 169 seconds
Activated Clotting Time: 169 seconds
Activated Clotting Time: 169 seconds
Activated Clotting Time: 175 seconds
Activated Clotting Time: 819 seconds
Activated Clotting Time: >1000 seconds

## 2019-04-20 LAB — GLUCOSE, CAPILLARY
Glucose-Capillary: 200 mg/dL — ABNORMAL HIGH (ref 70–99)
Glucose-Capillary: 216 mg/dL — ABNORMAL HIGH (ref 70–99)
Glucose-Capillary: 218 mg/dL — ABNORMAL HIGH (ref 70–99)
Glucose-Capillary: 224 mg/dL — ABNORMAL HIGH (ref 70–99)
Glucose-Capillary: 227 mg/dL — ABNORMAL HIGH (ref 70–99)
Glucose-Capillary: 245 mg/dL — ABNORMAL HIGH (ref 70–99)

## 2019-04-20 LAB — MAGNESIUM: Magnesium: 2.2 mg/dL (ref 1.7–2.4)

## 2019-04-20 LAB — COOXEMETRY PANEL
Carboxyhemoglobin: 0.9 % (ref 0.5–1.5)
Methemoglobin: 0.9 % (ref 0.0–1.5)
O2 Saturation: 81.6 %
Total hemoglobin: 8.1 g/dL — ABNORMAL LOW (ref 12.0–16.0)

## 2019-04-20 LAB — LACTATE DEHYDROGENASE: LDH: 511 U/L — ABNORMAL HIGH (ref 98–192)

## 2019-04-20 LAB — PREPARE RBC (CROSSMATCH)

## 2019-04-20 MED ORDER — INSULIN DETEMIR 100 UNIT/ML ~~LOC~~ SOLN
5.0000 [IU] | Freq: Two times a day (BID) | SUBCUTANEOUS | Status: DC
  Administered 2019-04-20 – 2019-04-24 (×9): 5 [IU] via SUBCUTANEOUS
  Filled 2019-04-20 (×10): qty 0.05

## 2019-04-20 MED ORDER — MIDAZOLAM HCL 2 MG/2ML IJ SOLN
1.0000 mg | INTRAMUSCULAR | Status: AC | PRN
  Administered 2019-04-20 (×3): 2 mg via INTRAVENOUS
  Filled 2019-04-20: qty 2

## 2019-04-20 MED ORDER — LIDOCAINE-EPINEPHRINE 1 %-1:100000 IJ SOLN
INTRAMUSCULAR | Status: AC
  Filled 2019-04-20: qty 1

## 2019-04-20 MED ORDER — POTASSIUM CHLORIDE 20 MEQ/15ML (10%) PO SOLN
40.0000 meq | Freq: Once | ORAL | Status: AC
  Administered 2019-04-20: 09:00:00 40 meq via ORAL
  Filled 2019-04-20: qty 30

## 2019-04-20 MED ORDER — VANCOMYCIN HCL IN DEXTROSE 750-5 MG/150ML-% IV SOLN
750.0000 mg | INTRAVENOUS | Status: DC
  Administered 2019-04-20 – 2019-04-24 (×5): 750 mg via INTRAVENOUS
  Filled 2019-04-20 (×6): qty 150

## 2019-04-20 MED ORDER — LIDOCAINE-EPINEPHRINE 1 %-1:100000 IJ SOLN
20.0000 mL | Freq: Once | INTRAMUSCULAR | Status: AC
  Administered 2019-04-20: 19:00:00 10 mL
  Filled 2019-04-20: qty 20

## 2019-04-20 MED ORDER — FUROSEMIDE 10 MG/ML IJ SOLN
80.0000 mg | Freq: Once | INTRAMUSCULAR | Status: AC
  Administered 2019-04-20: 09:00:00 80 mg via INTRAVENOUS
  Filled 2019-04-20: qty 8

## 2019-04-20 MED ORDER — FUROSEMIDE 10 MG/ML IJ SOLN
80.0000 mg | Freq: Once | INTRAMUSCULAR | Status: AC
  Administered 2019-04-20: 80 mg via INTRAVENOUS
  Filled 2019-04-20: qty 8

## 2019-04-20 MED ORDER — MIDAZOLAM 50MG/50ML (1MG/ML) PREMIX INFUSION
2.0000 mg/h | INTRAVENOUS | Status: DC
  Administered 2019-04-20 – 2019-04-22 (×3): 2 mg/h via INTRAVENOUS
  Filled 2019-04-20 (×3): qty 50

## 2019-04-20 MED ORDER — LIDOCAINE-EPINEPHRINE 2 %-1:100000 IJ SOLN: 20.0000 mL | Freq: Once | INTRAMUSCULAR | Status: DC

## 2019-04-20 MED ORDER — SODIUM CHLORIDE 0.9 % IV SOLN
2.0000 g | Freq: Two times a day (BID) | INTRAVENOUS | Status: DC
  Administered 2019-04-20 – 2019-04-22 (×4): 2 g via INTRAVENOUS
  Filled 2019-04-20 (×5): qty 2

## 2019-04-20 MED ORDER — LEVETIRACETAM IN NACL 500 MG/100ML IV SOLN
500.0000 mg | Freq: Two times a day (BID) | INTRAVENOUS | Status: DC
  Administered 2019-04-20 – 2019-04-23 (×8): 500 mg via INTRAVENOUS
  Filled 2019-04-20 (×8): qty 100

## 2019-04-20 MED ORDER — POTASSIUM CHLORIDE 20 MEQ/15ML (10%) PO SOLN
40.0000 meq | Freq: Every day | ORAL | Status: DC
  Administered 2019-04-20 – 2019-04-29 (×10): 40 meq via ORAL
  Filled 2019-04-20 (×10): qty 30

## 2019-04-20 MED ORDER — MIDAZOLAM HCL 2 MG/2ML IJ SOLN
2.0000 mg | INTRAMUSCULAR | Status: DC | PRN
  Administered 2019-04-20 (×2): 2 mg via INTRAVENOUS
  Filled 2019-04-20 (×2): qty 2

## 2019-04-20 MED ORDER — MIDAZOLAM HCL 2 MG/2ML IJ SOLN
1.0000 mg | INTRAMUSCULAR | Status: DC | PRN
  Administered 2019-04-20: 18:00:00 4 mg via INTRAVENOUS
  Filled 2019-04-20 (×3): qty 2

## 2019-04-20 MED ORDER — ENOXAPARIN SODIUM 40 MG/0.4ML ~~LOC~~ SOLN
40.0000 mg | SUBCUTANEOUS | Status: DC
  Administered 2019-04-21 – 2019-04-23 (×3): 40 mg via SUBCUTANEOUS
  Filled 2019-04-20 (×3): qty 0.4

## 2019-04-20 MED ORDER — SODIUM CHLORIDE 0.9% IV SOLUTION: Freq: Once | INTRAVENOUS | Status: DC

## 2019-04-20 MED ORDER — ATROPINE SULFATE 1 MG/10ML IJ SOSY
PREFILLED_SYRINGE | INTRAMUSCULAR | Status: AC
  Filled 2019-04-20: qty 10

## 2019-04-20 MED ORDER — INSULIN ASPART 100 UNIT/ML ~~LOC~~ SOLN
0.0000 [IU] | SUBCUTANEOUS | Status: DC
  Administered 2019-04-20 (×5): 3 [IU] via SUBCUTANEOUS
  Administered 2019-04-20: 2 [IU] via SUBCUTANEOUS
  Administered 2019-04-21: 5 [IU] via SUBCUTANEOUS
  Administered 2019-04-21 (×2): 2 [IU] via SUBCUTANEOUS
  Administered 2019-04-21: 09:00:00 3 [IU] via SUBCUTANEOUS
  Administered 2019-04-21: 05:00:00 5 [IU] via SUBCUTANEOUS

## 2019-04-20 MED ORDER — CISATRACURIUM BESYLATE (PF) 10 MG/5ML IV SOLN
10.0000 mg | Freq: Once | INTRAVENOUS | Status: DC
  Filled 2019-04-20: qty 5

## 2019-04-20 NOTE — CV Procedure (Signed)
Radial arterial line placement.    Emergent consent assumed. The left wrist was prepped and draped in the routine sterile fashion a single lumen radial arterial catheter was placed in the left radial artery using a modified Seldinger technique. Good blood flow and wave forms. A dressing was placed.    Glori Bickers, MD  7:50 PM

## 2019-04-20 NOTE — Progress Notes (Signed)
Inpatient Diabetes Program Recommendations  AACE/ADA: New Consensus Statement on Inpatient Glycemic Control (2015)  Target Ranges:  Prepandial:   less than 140 mg/dL      Peak postprandial:   less than 180 mg/dL (1-2 hours)      Critically ill patients:  140 - 180 mg/dL   Lab Results  Component Value Date   GLUCAP 227 (H) 04/20/2019   HGBA1C 7.2 (H) 04/19/2019    Review of Glycemic Control Results for AZZAM, MEHRA (MRN [id number]) as of 04/20/2019 11:32  Ref. Range 04/19/2019 09:03 04/19/2019 11:59 04/19/2019 20:43 04/20/2019 00:52 04/20/2019 05:05 04/20/2019 08:08  Glucose-Capillary Latest Ref Range: 70 - 99 mg/dL 159 (H) 197 (H) 219 (H) 224 (H) 245 (H) 218 (H)   Diabetes history: None Outpatient Diabetes medications: None Current orders for Inpatient glycemic control:  Novolog sensitive q 4 hours  Inpatient Diabetes Program Recommendations:   Please consider adding Levemir 5 units bid.  Also consider adding Novolog 3 units q 4 hours to cover tube feeds.    Thanks  Adah Perl, RN, BC-ADM Inpatient Diabetes Coordinator Pager [phone]

## 2019-04-20 NOTE — Progress Notes (Signed)
Pharmacy Antibiotic Note  Lawrence Olson is a 68 y.o. male admitted on 04/16/2019 with cardiogenic shock s/p arrest. Pt on ABX initially then D/Cd on 8/26. Pharmacy has been consulted for restart of vancomycin and cefepime dosing as pt is now febrile.  Plan: Cefepime 2g IV q12h Vancomycin 750mg  IV q24h - est AUC 466 Vancomycin levels as needed Follow renal funx, LOT  Height: 5\' 6"  (167.6 cm) Weight: 212 lb 8.4 oz (96.4 kg) IBW/kg (Calculated) : 63.8  Temp (24hrs), Avg:99.2 F (37.3 C), Min:98.8 F (37.1 C), Max:99.9 F (37.7 C)  Recent Labs  Lab 04/16/19 2200 04/17/19 0103 04/17/19 0547 04/17/19 1254 04/17/19 1306 04/18/19 0253 04/18/19 1006 04/19/19 0359 04/19/19 0509 04/19/19 1717 04/20/19 0452  WBC  --   --  25.1*  --   --  27.3*  --   --  19.3* 17.0* 16.0*  CREATININE  --   --  1.80* 2.06* 1.90* 2.06*  --  2.09*  --   --  1.82*  LATICACIDVEN 6.4* 6.8* 4.5*  --   --   --  0.9  --   --   --   --     Estimated Creatinine Clearance: 42.2 mL/min (A) (by C-G formula based on SCr of 1.82 mg/dL (H)).    No Known Allergies  Antimicrobials this admission: Vancomycin 8/24 >> 8/25; 8/28 >> Cefepime 8/24 >> 8/26; 8/28 >>  Microbiology results: 8/24 MRSA PCR: positive 8/24 BCx: NGTD   Thank you for allowing pharmacy to be a part of this patient's care.   Arrie Senate, PharmD, BCPS Clinical Pharmacist [phone] Please check AMION for all [place] numbers 04/20/2019

## 2019-04-20 NOTE — Progress Notes (Signed)
NAMEDelroy Ordway, [name]  [id number], DOB:  [birth year], LOS: 0 ADMISSION DATE:  04/16/2019, CONSULTATION DATE:  04/16/2019 REFERRING MD:  Claiborne Billings CHIEF COMPLAINT:  STEMI, resp failure  Brief History   68yo M out of hospital cardiac arrest with ROSC, intubated, found to have STEMI, taken emergently to cath lab. Proximal LAD lesion. PCCM consulted for management assistance.   Past Medical History  Unknown  Significant Hospital Events   8/24: Cath Lab. PTCI unsuccessful despite multiple attempts.  RA 13 RV 42/16 PA 40/26 (31) PCWP 18 Pa sat 50%  Fick 2.7/1.3 CPO 0.6 Impella placed. VT /p same requiring defib.  8/27: Remains comatose with decorticate posturing.  Still on Impella support.  Weaning vasoactive drips.  At this point major question is neurological recovery.  Would need further cardiac intervention if he were to recover neurologically.  Neurology has been consulted Consults:  Cardiology PCCM Heart failure   Procedures:  8/24: PTCI unsuccessful 8/14: Impella L groin>> 8/24: 7.5 ETT>> 8/24: Right femoral venous sheath with PA cath>> 8/24: Right femoral arterial sheath>> 8/24: Left femoral arterial sheath>> 8/24: L IJ CVC >>>  Significant Diagnostic Tests:  Chest x-ray 8/27: Bilateral interstitial edema Echocardiogram 8/27: Well-positioned Impella cannula.  Continued severely depressed LV function with anteroapical wall motion abnormality.  Micro Data:  [id number]: Negative 8/24 blood cultures x2>>> negative Urine culture 8/27:>>> Negative MRSA swab: Positive  Antimicrobials:  Vancomycin and cefepime from 8/25 through 826  Interim history/subjective:  Impella decreased to P3 today by Dr. Haroldine Laws.  Ongoing aggressive diuresis. Continues to be encephalopathic but intermittently follows commands in [place]. Seen by neurology yesterday.  No seizure activity on EEG.  Ongoing observation recommended.  Objective    Today's Vitals   04/20/19 0730  04/20/19 0745 04/20/19 0800 04/20/19 0815  BP:   112/66   Pulse: 78 79 78 89  Resp: (!) 21 20 (!) 21 20  Temp: 99.3 F (37.4 C) 99.1 F (37.3 C) 99.3 F (37.4 C) 99.5 F (37.5 C)  TempSrc:   Bladder   SpO2: 96% 95% 94% 94%  Weight:      Height:      PainSc:       Vent Mode: PRVC FiO2 (%):  [50 %-100 %] 60 % Set Rate:  [21 bmp] 21 bmp Vt Set:  [510 mL] 510 mL PEEP:  [5 cmH20] 5 cmH20 Plateau Pressure:  [19 cmH20-22 cmH20] 21 cmH20  PAP: (28-69)/(11-44) 64/31 CVP:  [3 mmHg-35 mmHg] 19 mmHg CO:  [6.6 L/min-7.9 L/min] 6.6 L/min CI:  [3.4 L/min/m2-4 L/min/m2] 3.4 L/min/m2 Filling pressures have increased with reduction in pump flow.  Examination:  General 68 year old white male currently withdraws to pain otherwise unresponsive HEENT sclerae icteric scleral edema orally intubated no JVD mucous membranes moist Pulmonary: Occasional wheezes Cardiac: Regular rhythm.  Machinelike wearing from pump. Abdomen: Soft nontender Extremities: Trace edema warm to palpation Neuro: Withdraws to pain, decorticate posturing noted.  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  Patient was taken to the cath lab and found to have 100% proximal LAD lesion. PCI unsuccessful so impella placed.    Acute systolic  heart failure and cardiogenic shock status post cardiac arrest/ VF- r/t anterior MI, CAD with acute STEMI - 100% prox LAD - PCI unsuccessful  -shock state improving  Plan Maintain current dose of milrinone. Aggressive diuresis to normalize filling pressures Dr. Haroldine Laws to remove Impella today.  Acute hypoxic respiratory failure - r/t MI, cardiac arrest, cardiogenic  shock Plan Full ventilator support Diuresis today Full sedation vacation and weaning once Impella removed.  AKI - s/p cardiac arrest  Plan Continue diuresis  Anemia of critical illness, continues to have intermittent hematuria - aggrastat off  -on heparin gtt for impella Plan Transfuse and follow CBC  Mildly elevated LFTs with icteric sclera trending down Plan Continue to trend LFTs for now will defer ultrasound imaging at this point given poor neurological exam  Acute encephalopathy - post cardiac arrest. On further questioning it appears as though time to review of spontaneous circulation may have been longer than initially felt.  He opens his eyes however this seems to be spontaneously, and is exhibiting decorticate posturing raising concern for anoxic injury certainly nonpurposeful Plan Full sedation vacation once Impella removed We will consider MRI of brain if remains encephalopathic  Hyperglycemia Plan Add sliding scale insulin   Best practice  Sedation: PAD protocol Nutrition: tubefeeds SUP: H2B DVT prophylaxis: IV heparin Glycemic control: SSI   CRITICAL CARE Performed by: Kipp Brood   Total critical care time: 40 minutes  Critical care time was exclusive of separately billable procedures and treating other patients.  Critical care was necessary to treat or prevent imminent or life-threatening deterioration.  Critical care was time spent personally by me on the following activities: development of treatment plan with patient and/or surrogate as well as nursing, discussions with consultants, evaluation of patient's response to treatment, examination of patient, obtaining history from patient or surrogate, ordering and performing treatments and interventions, ordering and review of laboratory studies, ordering and review of radiographic studies, pulse oximetry, re-evaluation of patient's condition and participation in multidisciplinary rounds.  Kipp Brood, MD [facility] ICU Physician [place]  Pager: [phone] Mobile: [phone] After hours: [phone].

## 2019-04-20 NOTE — Progress Notes (Addendum)
Patient ID: Lawrence Olson, male   DOB: 1951-03-26, 68 y.o.   [name] [id number]    Advanced Heart Failure Team  Note   Primary Physician: Patient, No Pcp Per PCP-Cardiologist:  No primary care provider on file.  Reason for Consultation: Cardiogenic shock  HPI:    Awake on vent (fio2 50%) seems to move purposefully but not clearly following commands  Was on P-5 this am and I turned down to P-3  He is off epinephrine and norepinephrine, Milrinone 0.2 mcg. He continues on heparin gtt and amiodarone gtt.  Continues to ooze around [facility]. Groin lines ok.   Got 2u RBCs last night.     Impella P 3 2.2 L/min [place] #s  On P-5 RA 12 PA 45/21  Fick 6.6/3.4  Co-ox  581%     Objective:    Vital Signs:   Temp:  [98.6 F (37 C)-99.9 F (37.7 C)] 99.5 F (37.5 C) (08/28 0815) Pulse Rate:  [42-108] 89 (08/28 0815) Resp:  [5-25] 20 (08/28 0815) BP: (99-142)/(56-99) 112/66 (08/28 0800) SpO2:  [65 %-100 %] 94 % (08/28 0815) Arterial Line BP: (89-162)/(47-92) 134/72 (08/28 0815) FiO2 (%):  [50 %-100 %] 60 % (08/28 0800) Weight:  [96.4 kg] 96.4 kg (08/28 0500) Last BM Date: (PTA)  Weight change: Filed Weights   04/18/19 0500 04/19/19 0500 04/20/19 0500  Weight: 100 kg 98 kg 96.4 kg    Intake/Output:   Intake/Output Summary (Last 24 hours) at 04/20/2019 0832 Last data filed at 04/20/2019 0800 Gross per 24 hour  Intake 4299.65 ml  Output 3000 ml  Net 1299.65 ml      Physical Exam    General: Intubated. Awake. Seems to be reaching for tube. Will not follow commands in English HEENT: normal + ETT Neck: supple. [facility] CVL with ooze. Carotids 2+ bilat; no bruits. No lymphadenopathy or thryomegaly appreciated. Cor: PMI nondisplaced. Regular rate & rhythm. No rubs, gallops or murmurs. + impella hum Lungs: clear Abdomen: obese soft, nontender, nondistended. No hepatosplenomegaly. No bruits or masses. Good bowel sounds. Extremities: no cyanosis, clubbing, rash, edema  Groin lines ok  Neuro: Awake. Seems to be reaching for tube. Will not follow commands in English    Telemetry   NSR 80-90s with occasional PVCs Personally reviewed  Labs   Basic Metabolic Panel: Recent Labs  Lab 04/17/19 0547  04/17/19 1254 04/17/19 1306 04/18/19 0253 04/19/19 0359 04/20/19 0452  NA 140   < > 142 146* 139 140 138  K 4.0   < > 3.9 3.9 3.5 3.8 3.7  CL 110  --  112* 109 112* 111 107  CO2 19*  --  21*  --  21* 23 25  GLUCOSE 251*  --  143* 136* 179* 217* 267*  BUN 27*  --  32* 34* 38* 49* 51*  CREATININE 1.80*  --  2.06* 1.90* 2.06* 2.09* 1.82*  CALCIUM 7.3*  --  7.5*  --  7.5* 7.7* 8.0*  MG 2.0  --  1.9  --   --  1.8  --   PHOS 1.3*  --   --   --   --   --   --    < > = values in this interval not displayed.    Liver Function Tests: Recent Labs  Lab 04/16/19 1335 04/18/19 0946  AST 166* 102*  ALT 211* 85*  ALKPHOS 83 39  BILITOT 0.8 1.4*  PROT 6.3* 4.6*  ALBUMIN 3.5 2.3*   No results for  input(s): LIPASE, AMYLASE in the last 168 hours. No results for input(s): AMMONIA in the last 168 hours.  CBC: Recent Labs  Lab 04/16/19 1335  04/17/19 0547  04/17/19 1306 04/18/19 0253 04/19/19 0509 04/19/19 1717 04/20/19 0452  WBC 27.5*  --  25.1*  --   --  27.3* 19.3* 17.0* 16.0*  NEUTROABS 19.3*  --   --   --   --   --   --   --   --   HGB 13.8   < > 11.0*   < > 9.2* 8.3* 6.5* 8.0* 7.4*  HCT 42.9   < > 33.2*   < > 27.0* 25.2* 19.9* 23.6* 22.6*  MCV 100.2*  --  96.8  --   --  99.2 98.0 92.9 95.4  PLT 331  --  377  --   --  251 157 138* 124*   < > = values in this interval not displayed.    Cardiac Enzymes: No results for input(s): CKTOTAL, CKMB, CKMBINDEX, TROPONINI in the last 168 hours.  BNP: BNP (last 3 results) No results for input(s): BNP in the last 8760 hours.  ProBNP (last 3 results) No results for input(s): PROBNP in the last 8760 hours.   CBG: Recent Labs  Lab 04/19/19 1159 04/19/19 2043 04/20/19 0052 04/20/19 0505 04/20/19 0808  GLUCAP  197* 219* 224* 245* 218*    Coagulation Studies: No results for input(s): LABPROT, INR in the last 72 hours.   Imaging   No results found.   Medications:     Current Medications: . aspirin  81 mg Oral Daily  . atorvastatin  80 mg Per Tube q1800  . chlorhexidine gluconate (MEDLINE KIT)  15 mL Mouth Rinse BID  . Chlorhexidine Gluconate Cloth  6 each Topical Daily  . cisatracurium  10 mg Intravenous Once  . famotidine  20 mg Per Tube Daily  . feeding supplement (PRO-STAT SUGAR FREE 64)  30 mL Per Tube BID  . fentaNYL (SUBLIMAZE) injection  25 mcg Intravenous Once  . heparin  4,000 Units Intravenous Once  . insulin aspart  0-9 Units Subcutaneous Q4H  . mouth rinse  15 mL Mouth Rinse 10 times per day  . mupirocin ointment  1 application Nasal BID  . sodium chloride flush  3 mL Intravenous Q12H    Infusions: . sodium chloride 10 mL/hr at 04/20/19 0800  . amiodarone 30 mg/hr (04/20/19 0800)  . feeding supplement (VITAL AF 1.2 CAL) 1,000 mL (04/19/19 2105)  . fentaNYL infusion INTRAVENOUS 150 mcg/hr (04/20/19 0800)  . impella catheter heparin 50 unit/mL in dextrose 5%    . heparin 1,400 Units/hr (04/20/19 0800)  . milrinone 0.25 mcg/kg/min (04/20/19 0800)  . norepinephrine (LEVOPHED) Adult infusion Stopped (04/20/19 0622)    Assessment/Plan   1. CAD: Anterolateral STEMI, cath 8/24 showed occluded proximal LAD, unable to open.  Once we see how he recovers from this initial event, will need to decide on feasibility of re-attempt PCI versus CABG.  - Continue ASA + statin.  - Heparin gtt for Impella.  - Stable. Trop is down.  - Will eventually need to consider CABG vs CTO LAD vs medical therapy. Will need to get impella out and get off vent to assess hemodynamics and for neuro recovery first - can consider cMRI as needed 2. Cardiogenic shock: Ischemic cardiomyopathy.  Echo with EF 25%, normal RV.   - Currently supported with Impella at P5 - NE and epi off.  On milrinone 0.25.  CVP around 12.  - Decrease Impella to P-3 for now. Hopefully can remove today after diuresis. Give lasix 80 IV  3. AKI: Suspect hemodynamic.  Creatinine improved today at 1.82  4. Cardiac arrest/VF: 20 minutes downtime, concerned for anoxic brain injury.   - Neuro following. - EEG normal - Apparently was able to folow commands for his wife when she was speaking his native language to him. Neuro recommending CT when more stable.  - continue amiodarone gtt.  5. ID: WBCs at 16K  Off abk. Low threshold to restart until all lines/devices are removed 6. Anemia: Hgb down to 6.5-> 2u RBC -> 7.4 - Transfuse more as needed. Keep hgb >= 7.5 7. Acute hypoxic respiratory failure - CCM following. Hopefully can wean vent after Impella out and diuresesd - D/w Dr. Lynetta Mare today at bedside  CRITICAL CARE Performed by: Glori Bickers  Total critical care time: 45 minutes  Critical care time was exclusive of separately billable procedures and treating other patients.  Critical care was necessary to treat or prevent imminent or life-threatening deterioration.  Critical care was time spent personally by me (independent of midlevel providers or residents) on the following activities: development of treatment plan with patient and/or surrogate as well as nursing, discussions with consultants, evaluation of patient's response to treatment, examination of patient, obtaining history from patient or surrogate, ordering and performing treatments and interventions, ordering and review of laboratory studies, ordering and review of radiographic studies, pulse oximetry and re-evaluation of patient's condition.   Glori Bickers 04/20/2019 8:32 AM

## 2019-04-20 NOTE — Progress Notes (Signed)
Subjective: Intubated. Received a small Versed bolus shortly before neurology follow up exam.   Objective: Current vital signs: BP 112/66 (BP Location: Left Arm)   Pulse 78   Temp 99.3 F (37.4 C)   Resp (!) 21   Ht '5\' 6"'  (1.676 m)   Wt 96.4 kg   SpO2 97%   BMI 34.30 kg/m  Vital signs in last 24 hours: Temp:  [98.6 F (37 C)-99.9 F (37.7 C)] 99.3 F (37.4 C) (08/28 0915) Pulse Rate:  [42-108] 78 (08/28 0915) Resp:  [5-25] 21 (08/28 0915) BP: (99-142)/(56-99) 112/66 (08/28 0800) SpO2:  [65 %-100 %] 97 % (08/28 0915) Arterial Line BP: (89-162)/(47-92) 119/64 (08/28 0915) FiO2 (%):  [50 %-100 %] 60 % (08/28 0800) Weight:  [96.4 kg] 96.4 kg (08/28 0500)  Intake/Output from previous day: 08/27 0701 - 08/28 0700 In: 4169.5 [I.V.:1499.4; Blood:575; NG/GT:1560; IV Piggyback:275] Out: 2975 [Urine:2975] Intake/Output this shift: Total I/O In: 335.4 [I.V.:133.4; Other:22; NG/GT:180] Out: 375 [Urine:375] Nutritional status:  Diet Order            Diet NPO time specified  Diet effective now             HEENT: [place]/AT Ext: Immobilizers have been placed to BLE.   Neurologic Exam: Ment: Intubated and sedated. No responses to any external stimuli except for weak flexor posturing with BUE to light sternal rub.  CN: Blinks to eyelash stimulation. Opens eyes to light sternal stimulation. PERRL. Weak oculocephalic reflex. Face flaccidly symmetric.  Motor: Flexor posturing with BUE to light sternal rub. Legs with immobilizers due to spontaneous excess movement overnight.   Lab Results: Results for orders placed or performed during the hospital encounter of 04/16/19 (from the past 48 hour(s))  Hepatic function panel     Status: Abnormal   Collection Time: 04/18/19  9:46 AM  Result Value Ref Range   Total Protein 4.6 (L) 6.5 - 8.1 g/dL   Albumin 2.3 (L) 3.5 - 5.0 g/dL   AST 102 (H) 15 - 41 U/L   ALT 85 (H) 0 - 44 U/L   Alkaline Phosphatase 39 38 - 126 U/L   Total Bilirubin 1.4  (H) 0.3 - 1.2 mg/dL   Bilirubin, Direct 0.4 (H) 0.0 - 0.2 mg/dL   Indirect Bilirubin 1.0 (H) 0.3 - 0.9 mg/dL    Comment: Performed at [facility], [address] [place], [place] [address]  Lactic acid, plasma     Status: None   Collection Time: 04/18/19 10:06 AM  Result Value Ref Range   Lactic Acid, Venous 0.9 0.5 - 1.9 mmol/L    Comment: Performed at [facility] [address] [place], [place] [address]  Glucose, capillary     Status: None   Collection Time: 04/18/19 10:27 AM  Result Value Ref Range   Glucose-Capillary 90 70 - 99 mg/dL  Glucose, capillary     Status: Abnormal   Collection Time: 04/18/19 11:00 AM  Result Value Ref Range   Glucose-Capillary 130 (H) 70 - 99 mg/dL  Glucose, capillary     Status: Abnormal   Collection Time: 04/18/19 12:04 PM  Result Value Ref Range   Glucose-Capillary 118 (H) 70 - 99 mg/dL  POCT Activated clotting time     Status: None   Collection Time: 04/18/19 12:06 PM  Result Value Ref Range   Activated Clotting Time 158 seconds  Glucose, capillary     Status: Abnormal   Collection Time: 04/18/19  4:00 PM  Result Value Ref Range   Glucose-Capillary 157 (H) 70 - 99 mg/dL  POCT Activated clotting time     Status: None   Collection Time: 04/18/19  4:02 PM  Result Value Ref Range   Activated Clotting Time 164 seconds  Glucose, capillary     Status: Abnormal   Collection Time: 04/18/19  8:01 PM  Result Value Ref Range   Glucose-Capillary 161 (H) 70 - 99 mg/dL  POCT Activated clotting time     Status: None   Collection Time: 04/18/19  8:03 PM  Result Value Ref Range   Activated Clotting Time 158 seconds  Glucose, capillary     Status: Abnormal   Collection Time: 04/19/19 12:06 AM  Result Value Ref Range   Glucose-Capillary 181 (H) 70 - 99 mg/dL  POCT Activated clotting time     Status: None   Collection Time: 04/19/19 12:08 AM  Result Value Ref Range   Activated Clotting Time 158 seconds  POCT Activated clotting time      Status: None   Collection Time: 04/19/19  1:00 AM  Result Value Ref Range   Activated Clotting Time 158 seconds  POCT Activated clotting time     Status: None   Collection Time: 04/19/19  2:13 AM  Result Value Ref Range   Activated Clotting Time 158 seconds  POCT Activated clotting time     Status: None   Collection Time: 04/19/19  3:24 AM  Result Value Ref Range   Activated Clotting Time 169 seconds  Glucose, capillary     Status: Abnormal   Collection Time: 04/19/19  3:29 AM  Result Value Ref Range   Glucose-Capillary 236 (H) 70 - 99 mg/dL  .Cooxemetry Panel (carboxy, met, total hgb, O2 sat)     Status: Abnormal   Collection Time: 04/19/19  3:50 AM  Result Value Ref Range   Total hemoglobin 6.8 (LL) 12.0 - 16.0 g/dL    Comment: CRITICAL RESULT CALLED TO, READ BACK BY AND VERIFIED WITH: HANNA BOPE RN AT 0405 BY TIM SNIDER RCP,RRT ON 04/19/2019    O2 Saturation 93.4 %   Carboxyhemoglobin 0.6 0.5 - 1.5 %   Methemoglobin 1.1 0.0 - 1.5 %  Basic metabolic panel     Status: Abnormal   Collection Time: 04/19/19  3:59 AM  Result Value Ref Range   Sodium 140 135 - 145 mmol/L   Potassium 3.8 3.5 - 5.1 mmol/L   Chloride 111 98 - 111 mmol/L   CO2 23 22 - 32 mmol/L   Glucose, Bld 217 (H) 70 - 99 mg/dL   BUN 49 (H) 8 - 23 mg/dL   Creatinine, Ser 2.09 (H) 0.61 - 1.24 mg/dL   Calcium 7.7 (L) 8.9 - 10.3 mg/dL   GFR calc non Af Amer 32 (L) >60 mL/min   GFR calc Af Amer 37 (L) >60 mL/min   Anion gap 6 5 - 15    Comment: Performed at [facility], [place] [address] [place], [place] [address]  Lactate dehydrogenase     Status: Abnormal   Collection Time: 04/19/19  3:59 AM  Result Value Ref Range   LDH 689 (H) 98 - 192 U/L    Comment: Performed at [facility], [place] [address], [place], [place] [address]  Magnesium     Status: None   Collection Time: 04/19/19  3:59 AM  Result Value Ref Range   Magnesium 1.8 1.7 - 2.4 mg/dL    Comment: Performed at [facility], [place]  [address], [place], [place] [address]  .Cooxemetry Panel (carboxy, met, total hgb, O2 sat)     Status: Abnormal   Collection Time: 04/19/19  4:34 AM  Result Value Ref Range   Total hemoglobin 9.8 (L) 12.0 - 16.0 g/dL   O2 Saturation 51.0 %   Carboxyhemoglobin 1.2 0.5 - 1.5 %   Methemoglobin 1.5 0.0 - 1.5 %  CBC     Status: Abnormal   Collection Time: 04/19/19  5:09 AM  Result Value Ref Range   WBC 19.3 (H) 4.0 - 10.5 K/uL   RBC 2.03 (L) 4.22 - 5.81 MIL/uL   Hemoglobin 6.5 (LL) 13.0 - 17.0 g/dL    Comment: REPEATED TO VERIFY THIS CRITICAL RESULT HAS VERIFIED AND BEEN CALLED TO E. MCDUFFIE, RN BY JULIE MACEDA DEL ANGEL ON 08 27 2020 AT 5809, AND HAS BEEN READ BACK.     HCT 19.9 (L) 39.0 - 52.0 %   MCV 98.0 80.0 - 100.0 fL   MCH 32.0 26.0 - 34.0 pg   MCHC 32.7 30.0 - 36.0 g/dL   RDW 13.6 11.5 - 15.5 %   Platelets 157 150 - 400 K/uL   nRBC 0.0 0.0 - 0.2 %    Comment: Performed at [facility], [place] [address], [place], [place] [address]  Hemoglobin A1c     Status: Abnormal   Collection Time: 04/19/19  5:09 AM  Result Value Ref Range   Hgb A1c MFr Bld 7.2 (H) 4.8 - 5.6 %    Comment: (NOTE) Pre diabetes:          5.7%-6.4% Diabetes:              >6.4% Glycemic control for   <7.0% adults with diabetes    Mean Plasma Glucose 159.94 mg/dL    Comment: Performed at [facility] [address], [place], [place] [address]  Prepare RBC     Status: None   Collection Time: 04/19/19  7:48 AM  Result Value Ref Range   Order Confirmation      ORDER PROCESSED BY BLOOD BANK Performed at [facility], [place] [address], [place], [place] [address]   Type and screen [place]     Status: None   Collection Time: 04/19/19  7:48 AM  Result Value Ref Range   ABO/RH(D) B NEG    Antibody Screen NEG    Sample Expiration 04/22/2019,2359    Unit Number [id number]    Blood Component Type RED CELLS,LR    Unit division 00    Status of Unit ISSUED,FINAL    Transfusion  Status OK TO TRANSFUSE    Crossmatch Result      Compatible Performed at [facility], [place] [address] [place], [place] [address]    Unit Number [id number]    Blood Component Type RED CELLS,LR    Unit division 00    Status of Unit ISSUED,FINAL    Transfusion Status OK TO TRANSFUSE    Crossmatch Result Compatible   ABO/Rh     Status: None   Collection Time: 04/19/19  7:48 AM  Result Value Ref Range   ABO/RH(D)      B NEG Performed at [facility] [address], [place], [place] [address]   Glucose, capillary     Status: Abnormal   Collection Time: 04/19/19  9:03 AM  Result Value Ref Range   Glucose-Capillary 159 (H) 70 -  99 mg/dL  POCT Activated clotting time     Status: None   Collection Time: 04/19/19  9:05 AM  Result Value Ref Range   Activated Clotting Time 164 seconds  Glucose, capillary     Status: Abnormal   Collection Time: 04/19/19 11:59 AM  Result Value Ref Range   Glucose-Capillary 197 (H) 70 - 99 mg/dL  POCT Activated clotting time     Status: None   Collection Time: 04/19/19 12:30 PM  Result Value Ref Range   Activated Clotting Time 175 seconds  .Cooxemetry Panel (carboxy, met, total hgb, O2 sat)     Status: Abnormal   Collection Time: 04/19/19  2:40 PM  Result Value Ref Range   Total hemoglobin 7.9 (L) 12.0 - 16.0 g/dL   O2 Saturation 66.3 %   Carboxyhemoglobin 1.6 (H) 0.5 - 1.5 %   Methemoglobin 1.5 0.0 - 1.5 %  POCT Activated clotting time     Status: None   Collection Time: 04/19/19  4:54 PM  Result Value Ref Range   Activated Clotting Time 164 seconds  CBC     Status: Abnormal   Collection Time: 04/19/19  5:17 PM  Result Value Ref Range   WBC 17.0 (H) 4.0 - 10.5 K/uL   RBC 2.54 (L) 4.22 - 5.81 MIL/uL   Hemoglobin 8.0 (L) 13.0 - 17.0 g/dL    Comment: REPEATED TO VERIFY POST TRANSFUSION SPECIMEN    HCT 23.6 (L) 39.0 - 52.0 %   MCV 92.9 80.0 - 100.0 fL   MCH 31.5 26.0 - 34.0 pg   MCHC 33.9 30.0 - 36.0 g/dL   RDW 15.7 (H) 11.5 -  15.5 %   Platelets 138 (L) 150 - 400 K/uL   nRBC 0.2 0.0 - 0.2 %    Comment: Performed at [facility], [place] [address] [place], [place] [address]  Glucose, capillary     Status: Abnormal   Collection Time: 04/19/19  8:43 PM  Result Value Ref Range   Glucose-Capillary 219 (H) 70 - 99 mg/dL  POCT Activated clotting time     Status: None   Collection Time: 04/19/19  8:44 PM  Result Value Ref Range   Activated Clotting Time 819 seconds  POCT Activated clotting time     Status: None   Collection Time: 04/19/19  8:57 PM  Result Value Ref Range   Activated Clotting Time >1,000 seconds  POCT Activated clotting time     Status: None   Collection Time: 04/19/19  9:25 PM  Result Value Ref Range   Activated Clotting Time 169 seconds  Glucose, capillary     Status: Abnormal   Collection Time: 04/20/19 12:52 AM  Result Value Ref Range   Glucose-Capillary 224 (H) 70 - 99 mg/dL  POCT Activated clotting time     Status: None   Collection Time: 04/20/19 12:54 AM  Result Value Ref Range   Activated Clotting Time 175 seconds  Basic metabolic panel     Status: Abnormal   Collection Time: 04/20/19  4:52 AM  Result Value Ref Range   Sodium 138 135 - 145 mmol/L   Potassium 3.7 3.5 - 5.1 mmol/L   Chloride 107 98 - 111 mmol/L   CO2 25 22 - 32 mmol/L   Glucose, Bld 267 (H) 70 - 99 mg/dL   BUN 51 (H) 8 - 23 mg/dL   Creatinine, Ser 1.82 (H) 0.61 - 1.24 mg/dL   Calcium 8.0 (L) 8.9 - 10.3 mg/dL  GFR calc non Af Amer 37 (L) >60 mL/min   GFR calc Af Amer 43 (L) >60 mL/min   Anion gap 6 5 - 15    Comment: Performed at [facility] [address], [place], [place] [address]  Lactate dehydrogenase     Status: Abnormal   Collection Time: 04/20/19  4:52 AM  Result Value Ref Range   LDH 511 (H) 98 - 192 U/L    Comment: Performed at [facility] [address] [place], [place] [address]  CBC     Status: Abnormal   Collection Time: 04/20/19  4:52 AM  Result Value Ref Range   WBC 16.0  (H) 4.0 - 10.5 K/uL   RBC 2.37 (L) 4.22 - 5.81 MIL/uL   Hemoglobin 7.4 (L) 13.0 - 17.0 g/dL   HCT 22.6 (L) 39.0 - 52.0 %   MCV 95.4 80.0 - 100.0 fL   MCH 31.2 26.0 - 34.0 pg   MCHC 32.7 30.0 - 36.0 g/dL   RDW 16.5 (H) 11.5 - 15.5 %   Platelets 124 (L) 150 - 400 K/uL    Comment: REPEATED TO VERIFY   nRBC 0.3 (H) 0.0 - 0.2 %    Comment: Performed at [facility], [place] [address] [place], [place] [address]  Glucose, capillary     Status: Abnormal   Collection Time: 04/20/19  5:05 AM  Result Value Ref Range   Glucose-Capillary 245 (H) 70 - 99 mg/dL  POCT Activated clotting time     Status: None   Collection Time: 04/20/19  5:06 AM  Result Value Ref Range   Activated Clotting Time 169 seconds  .Cooxemetry Panel (carboxy, met, total hgb, O2 sat)     Status: Abnormal   Collection Time: 04/20/19  5:10 AM  Result Value Ref Range   Total hemoglobin 8.1 (L) 12.0 - 16.0 g/dL   O2 Saturation 81.6 %   Carboxyhemoglobin 0.9 0.5 - 1.5 %   Methemoglobin 0.9 0.0 - 1.5 %  Glucose, capillary     Status: Abnormal   Collection Time: 04/20/19  8:08 AM  Result Value Ref Range   Glucose-Capillary 218 (H) 70 - 99 mg/dL  POCT Activated clotting time     Status: None   Collection Time: 04/20/19  8:10 AM  Result Value Ref Range   Activated Clotting Time 164 seconds    Recent Results (from the past 240 hour(s))  SARS CORONAVIRUS 2 (TAT 6-12 HRS) Nasal Swab Aptima Multi Swab     Status: None   Collection Time: 04/16/19  1:29 PM   Specimen: Aptima Multi Swab; Nasal Swab  Result Value Ref Range Status   SARS Coronavirus 2 NEGATIVE NEGATIVE Final    Comment: (NOTE) SARS-CoV-2 target nucleic acids are NOT DETECTED. The SARS-CoV-2 RNA is generally detectable in upper and lower respiratory specimens during the acute phase of infection. Negative results do not preclude SARS-CoV-2 infection, do not rule out co-infections with other pathogens, and should not be used as the sole basis for treatment or  other patient management decisions. Negative results must be combined with clinical observations, patient history, and epidemiological information. The expected result is Negative. Fact Sheet for Patients: SugarRoll.be Fact Sheet for Healthcare Providers: https://www.woods-mathews.com/ This test is not yet approved or cleared by the [place] FDA and  has been authorized for detection and/or diagnosis of SARS-CoV-2 by FDA under an Emergency Use Authorization (EUA). This EUA will remain  in effect (meaning this test  can be used) for the duration of the COVID-19 declaration under Section 56 4(b)(1) of the Act, 21 U.S.C. section 360bbb-3(b)(1), unless the authorization is terminated or revoked sooner. Performed at [facility], [place] [address], [place], [place] [address]   MRSA PCR Screening     Status: Abnormal   Collection Time: 04/16/19 10:15 PM   Specimen: Nasopharyngeal  Result Value Ref Range Status   MRSA by PCR POSITIVE (A) NEGATIVE Final    Comment:        The GeneXpert MRSA Assay (FDA approved for NASAL specimens only), is one component of a comprehensive MRSA colonization surveillance program. It is not intended to diagnose MRSA infection nor to guide or monitor treatment for MRSA infections. RESULT CALLED TO, READ BACK BY AND VERIFIED WITH: l short rn 04/16/19 2345 jdw Performed at [facility], [place] [address], [place], [place] [address]   Culture, blood (Routine X 2) w Reflex to ID Panel     Status: None (Preliminary result)   Collection Time: 04/16/19 11:18 PM   Specimen: BLOOD RIGHT HAND  Result Value Ref Range Status   Specimen Description BLOOD RIGHT HAND  Final   Special Requests   Final    BOTTLES DRAWN AEROBIC ONLY Blood Culture adequate volume   Culture   Final    NO GROWTH 3 DAYS Performed at [facility], [place] [address], [place], [place] [address]    Report Status PENDING  Incomplete   Culture, blood (Routine X 2) w Reflex to ID Panel     Status: None (Preliminary result)   Collection Time: 04/16/19 11:25 PM   Specimen: BLOOD LEFT HAND  Result Value Ref Range Status   Specimen Description BLOOD LEFT HAND  Final   Special Requests   Final    BOTTLES DRAWN AEROBIC ONLY Blood Culture adequate volume   Culture   Final    NO GROWTH 3 DAYS Performed at [facility], [place] [address], [place], [place] [address]    Report Status PENDING  Incomplete    Lipid Panel No results for input(s): CHOL, TRIG, HDL, CHOLHDL, VLDL, LDLCALC in the last 72 hours.  Studies/Results: Dg Chest Port 1 View  Result Date: 04/20/2019 CLINICAL DATA:  Acute respiratory failure. EXAM: PORTABLE CHEST 1 VIEW COMPARISON:  04/19/2019 FINDINGS: Endotracheal tube terminates 3.2 cm above the carina. Left jugular catheter terminates over the upper SVC. An Impella device is unchanged, as is an inferior approach catheter terminating over the left pulmonary artery. The cardiac silhouette remains mildly enlarged. Lung volumes are unchanged with slight worsening of mixed interstitial and airspace opacity bilaterally. No large pleural effusion or pneumothorax is identified. IMPRESSION: Slight worsening of bilateral lung opacities likely reflecting edema. Electronically Signed   By: Logan Bores M.D.   On: 04/20/2019 08:44   Dg Chest Port 1 View  Result Date: 04/19/2019 CLINICAL DATA:  Code STEMI, post arrest EXAM: PORTABLE CHEST 1 VIEW COMPARISON:  04/18/2019 FINDINGS: No significant change in AP portable radiograph with support apparatus including endotracheal tube, Impella device, and IVC approach left pulmonary artery catheter. Cardiomegaly with mild, diffuse interstitial pulmonary opacity. No new or focal airspace opacity. IMPRESSION: No significant change in AP portable radiograph with support apparatus including endotracheal tube, Impella device, and IVC approach left pulmonary artery catheter. Cardiomegaly  with mild, diffuse interstitial pulmonary opacity. No new or focal airspace opacity. Electronically Signed   By: Eddie Candle M.D.   On: 04/19/2019 08:52  Medications:  Scheduled: . aspirin  81 mg Oral Daily  . atorvastatin  80 mg Per Tube q1800  . chlorhexidine gluconate (MEDLINE KIT)  15 mL Mouth Rinse BID  . Chlorhexidine Gluconate Cloth  6 each Topical Daily  . cisatracurium  10 mg Intravenous Once  . famotidine  20 mg Per Tube Daily  . feeding supplement (PRO-STAT SUGAR FREE 64)  30 mL Per Tube BID  . fentaNYL (SUBLIMAZE) injection  25 mcg Intravenous Once  . heparin  4,000 Units Intravenous Once  . insulin aspart  0-9 Units Subcutaneous Q4H  . mouth rinse  15 mL Mouth Rinse 10 times per day  . mupirocin ointment  1 application Nasal BID  . sodium chloride flush  3 mL Intravenous Q12H   Continuous: . sodium chloride 10 mL/hr at 04/20/19 0900  . amiodarone 30 mg/hr (04/20/19 0900)  . feeding supplement (VITAL AF 1.2 CAL) 1,000 mL (04/19/19 2105)  . fentaNYL infusion INTRAVENOUS 200 mcg/hr (04/20/19 0900)  . impella catheter heparin 50 unit/mL in dextrose 5%    . heparin 1,400 Units/hr (04/20/19 0900)  . milrinone 0.25 mcg/kg/min (04/20/19 0900)  . 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  1. Exam reveals intact brain stem reflexes and minimal cortical function. Flexor posturing to noxious stimuli noted. Opens eyes to light noxious stimulation. Was moving BLE nonpurposefully overnight.  2. 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  Recommendations: 1. No strong indication for anticonvulsant based on negative EEG, but given high risk of development of seizures in anoxic brain injury, Keppra  is being started at 500 mg BID (He has decreased renal function and if seizures occur on Keppra at 500 mg BID, would add Vimpat rather than increasing Keppra). Transaminases are elevated, therefore VPA or Dilantin are not optimal choices.  2. CT head when stable.  35 minutes spent in the neurological evaluation and management of this critically ill patient.     LOS: 4 days   '@Electronically'  signed: Dr. Kerney Elbe 04/20/2019  9:37 AM

## 2019-04-20 NOTE — Progress Notes (Signed)
PCCM Interval Progress Note  Called for significant bleeding from L IJ CVL insertion site.  Laceration noted below line entry site.  1 suture placed with good hemostasis.   Montey Hora, [facility] Pager: [phone].  If no answer, [phone] 04/20/2019, 2:26 AM

## 2019-04-20 NOTE — Progress Notes (Signed)
[facility] Progress Note Patient Name: Lawrence Olson DOB: 11/02/1950 [name] [id number]   Date of Service  04/20/2019  HPI/Events of Note  Ventricular dyssynchrony with desaturation. Blood sugar in the 200's without SS insulin coverage.  eICU Interventions  Versed 2 mg iv x 1 then  Versed changed to 1-2 mg iv Q 15 minutes prn, ceiling on Fentanyl infusion increased to 400 mcg,  Nimbex 10 mg iv x 1, Q 4 hourly blood sugar monitoring with SS insulin coverage initiated, I've asked critical care PA to see patient to evaluate need for a stitch at oozy central line access site.        Finn Altemose U Tamitha Norell 04/20/2019, 1:23 AM

## 2019-04-20 NOTE — Progress Notes (Signed)
ANTICOAGULATION CONSULT NOTE - Follow Up Consult  Pharmacy Consult for Heparin Indication: Impella, ACS  No Known Allergies  Patient Measurements: Height: 5\' 6"  (167.6 cm) Weight: 212 lb 8.4 oz (96.4 kg) IBW/kg (Calculated) : 63.8 Heparin Dosing Weight: 82.3  Vital Signs: Temp: 99.5 F (37.5 C) (08/28 0815) Temp Source: Bladder (08/28 0800) BP: 112/66 (08/28 0800) Pulse Rate: 89 (08/28 0815)  Labs: Recent Labs    04/18/19 0253 04/19/19 0359 04/19/19 0509 04/19/19 1717 04/20/19 0452  HGB 8.3*  --  6.5* 8.0* 7.4*  HCT 25.2*  --  19.9* 23.6* 22.6*  PLT 251  --  157 138* 124*  CREATININE 2.06* 2.09*  --   --  1.82*  TROPONINIHS 9,611*  --   --   --   --     Estimated Creatinine Clearance: 42.2 mL/min (A) (by C-G formula based on SCr of 1.82 mg/dL (H)).   Medical History: No past medical history on file.  Medications:  Infusions:  . sodium chloride 10 mL/hr at 04/20/19 0800  . amiodarone 30 mg/hr (04/20/19 0800)  . feeding supplement (VITAL AF 1.2 CAL) 1,000 mL (04/19/19 2105)  . fentaNYL infusion INTRAVENOUS 150 mcg/hr (04/20/19 0800)  . impella catheter heparin 50 unit/mL in dextrose 5%    . heparin 1,400 Units/hr (04/20/19 0800)  . milrinone 0.25 mcg/kg/min (04/20/19 0800)  . norepinephrine (LEVOPHED) Adult infusion Stopped (04/20/19 1610)    Assessment: 68 yo male admitted with STEMI, Impella placed in cath lab.  Heparin currently in purge solution is running at 11 mL/hr (550 units/hr) and systemic heparin is running at 14 mL/hr (1400 units/hr). ACT therapeutic in 160s.   Hematuria has resolved and still oozing around LIJ site (controlled) and R femoral line (improved).  Hgb down to 6.5 yesterday, received 2 PRBCs, went up to 8, now down to 7.4. Pltc continues to trend down to 124.  Goal of Therapy:  ACT goal 160-180 Monitor platelets by anticoagulation protocol: Yes   Plan:  Continue heparin in purge solution and systemic heparin. RN to titrate  systemic heparin per protocol to goal ACT. Plan to remove Impella today Monitor for bleeding.  Antonietta Jewel, PharmD, BCCCP Clinical Pharmacist  Phone: [phone]  Please check AMION for all [facility] phone numbers After 10:00 PM, call [facility] [phone] Please check [email or web address] for unit-specific pharmacist phone numbers

## 2019-04-20 NOTE — Progress Notes (Signed)
  Patient weaned on Impella at P-3 all day with stable hemodynamics. Diuresed 2.5L with 80 IV lasix.   Low-grade fevers all day. Creeping up to near 100 F. I restarted vanc/cefipime given multiple groin lines   I turned pump down to P-2 and stopped heparin. Hemodynamics remained stable. ACT 158.  I personally pulled Impella from left groin and held manual pressure for over 45 mins with good hemostasis. Fem stop applied.   I then placed left radial arterial line (see procedure note) and personally removed R femoral arterial line and held manual pressure for 20 mins. With good hemostasis.   R femoral vein swan site cleaned and I laced Biopatch and sterile dressing. Will plan on removing swan in am if hemodynamics stable with Impella out.   80mg  IV lasix given to help facilitate vent wean.   Daughter updated at bedside.   Total additional CCT not including arterial line placement or Impella removal was 70 minutes.   Glori Bickers, MD  7:48 PM

## 2019-04-21 ENCOUNTER — Inpatient Hospital Stay (HOSPITAL_COMMUNITY): Payer: PPO

## 2019-04-21 LAB — COOXEMETRY PANEL
Carboxyhemoglobin: 1.1 % (ref 0.5–1.5)
Carboxyhemoglobin: 1.4 % (ref 0.5–1.5)
Methemoglobin: 0.8 % (ref 0.0–1.5)
Methemoglobin: 1.2 % (ref 0.0–1.5)
O2 Saturation: 53 %
O2 Saturation: 73.5 %
Total hemoglobin: 9 g/dL — ABNORMAL LOW (ref 12.0–16.0)
Total hemoglobin: 9.9 g/dL — ABNORMAL LOW (ref 12.0–16.0)

## 2019-04-21 LAB — GLUCOSE, CAPILLARY
Glucose-Capillary: 189 mg/dL — ABNORMAL HIGH (ref 70–99)
Glucose-Capillary: 194 mg/dL — ABNORMAL HIGH (ref 70–99)
Glucose-Capillary: 197 mg/dL — ABNORMAL HIGH (ref 70–99)
Glucose-Capillary: 210 mg/dL — ABNORMAL HIGH (ref 70–99)
Glucose-Capillary: 217 mg/dL — ABNORMAL HIGH (ref 70–99)
Glucose-Capillary: 252 mg/dL — ABNORMAL HIGH (ref 70–99)
Glucose-Capillary: 254 mg/dL — ABNORMAL HIGH (ref 70–99)

## 2019-04-21 LAB — CULTURE, BLOOD (ROUTINE X 2)
Culture: NO GROWTH
Culture: NO GROWTH
Special Requests: ADEQUATE
Special Requests: ADEQUATE

## 2019-04-21 LAB — CBC
HCT: 24.9 % — ABNORMAL LOW (ref 39.0–52.0)
Hemoglobin: 8.2 g/dL — ABNORMAL LOW (ref 13.0–17.0)
MCH: 31.3 pg (ref 26.0–34.0)
MCHC: 32.9 g/dL (ref 30.0–36.0)
MCV: 95 fL (ref 80.0–100.0)
Platelets: 129 10*3/uL — ABNORMAL LOW (ref 150–400)
RBC: 2.62 MIL/uL — ABNORMAL LOW (ref 4.22–5.81)
RDW: 16.1 % — ABNORMAL HIGH (ref 11.5–15.5)
WBC: 14.2 10*3/uL — ABNORMAL HIGH (ref 4.0–10.5)
nRBC: 0.6 % — ABNORMAL HIGH (ref 0.0–0.2)

## 2019-04-21 LAB — BPAM RBC
Blood Product Expiration Date: 202009022359
Blood Product Expiration Date: 202009032359
Blood Product Expiration Date: 202009182359
ISSUE DATE / TIME: 202008270914
ISSUE DATE / TIME: 202008271135
ISSUE DATE / TIME: 202008282308
Unit Type and Rh: 1700
Unit Type and Rh: 1700
Unit Type and Rh: 1700

## 2019-04-21 LAB — TYPE AND SCREEN
ABO/RH(D): B NEG
Antibody Screen: NEGATIVE
Unit division: 0
Unit division: 0
Unit division: 0

## 2019-04-21 LAB — BASIC METABOLIC PANEL
Anion gap: 8 (ref 5–15)
BUN: 54 mg/dL — ABNORMAL HIGH (ref 8–23)
CO2: 26 mmol/L (ref 22–32)
Calcium: 8.3 mg/dL — ABNORMAL LOW (ref 8.9–10.3)
Chloride: 106 mmol/L (ref 98–111)
Creatinine, Ser: 2 mg/dL — ABNORMAL HIGH (ref 0.61–1.24)
GFR calc Af Amer: 39 mL/min — ABNORMAL LOW (ref >60)
GFR calc non Af Amer: 33 mL/min — ABNORMAL LOW (ref >60)
Glucose, Bld: 273 mg/dL — ABNORMAL HIGH (ref 70–99)
Potassium: 3.9 mmol/L (ref 3.5–5.1)
Sodium: 140 mmol/L (ref 135–145)

## 2019-04-21 LAB — LACTATE DEHYDROGENASE: LDH: 462 U/L — ABNORMAL HIGH (ref 98–192)

## 2019-04-21 MED ORDER — SODIUM CHLORIDE 0.9% FLUSH
10.0000 mL | Freq: Two times a day (BID) | INTRAVENOUS | Status: DC
  Administered 2019-04-22 – 2019-05-04 (×20): 10 mL via INTRAVENOUS

## 2019-04-21 MED ORDER — INSULIN ASPART 100 UNIT/ML ~~LOC~~ SOLN
0.0000 [IU] | SUBCUTANEOUS | Status: DC
  Administered 2019-04-21: 5 [IU] via SUBCUTANEOUS
  Administered 2019-04-21: 22:00:00 3 [IU] via SUBCUTANEOUS
  Administered 2019-04-22 (×3): 5 [IU] via SUBCUTANEOUS
  Administered 2019-04-22: 21:00:00 8 [IU] via SUBCUTANEOUS
  Administered 2019-04-22: 12:00:00 11 [IU] via SUBCUTANEOUS
  Administered 2019-04-23 (×3): 8 [IU] via SUBCUTANEOUS
  Administered 2019-04-23 (×3): 11 [IU] via SUBCUTANEOUS
  Administered 2019-04-24 (×2): 8 [IU] via SUBCUTANEOUS
  Administered 2019-04-24: 11 [IU] via SUBCUTANEOUS

## 2019-04-21 MED ORDER — FUROSEMIDE 10 MG/ML IJ SOLN
80.0000 mg | Freq: Two times a day (BID) | INTRAMUSCULAR | Status: DC
  Administered 2019-04-21 (×2): 80 mg via INTRAVENOUS
  Filled 2019-04-21 (×3): qty 8

## 2019-04-21 MED ORDER — ACETAMINOPHEN 160 MG/5ML PO SOLN: 650.0000 mg | ORAL | Status: DC | PRN

## 2019-04-21 MED ORDER — CHLORHEXIDINE GLUCONATE 0.12 % MT SOLN
OROMUCOSAL | Status: AC
  Administered 2019-04-21: 20:00:00 15 mL via OROMUCOSAL
  Filled 2019-04-21: qty 15

## 2019-04-21 MED ORDER — HYDRALAZINE HCL 20 MG/ML IJ SOLN
20.0000 mg | Freq: Two times a day (BID) | INTRAMUSCULAR | Status: DC | PRN
  Administered 2019-04-25 – 2019-04-26 (×2): 20 mg via INTRAVENOUS
  Filled 2019-04-21 (×2): qty 1

## 2019-04-21 MED ORDER — POTASSIUM CHLORIDE 10 MEQ/50ML IV SOLN
10.0000 meq | INTRAVENOUS | Status: DC
  Administered 2019-04-21 (×3): 10 meq via INTRAVENOUS
  Filled 2019-04-21 (×3): qty 50

## 2019-04-21 MED ORDER — HYDRALAZINE HCL 20 MG/ML IJ SOLN
10.0000 mg | INTRAMUSCULAR | Status: DC | PRN
  Administered 2019-04-21: 11:00:00 20 mg via INTRAVENOUS

## 2019-04-21 MED ORDER — ISOSORBIDE MONONITRATE ER 30 MG PO TB24
30.0000 mg | ORAL_TABLET | Freq: Every day | ORAL | Status: DC
  Administered 2019-04-21: 14:00:00 30 mg via ORAL
  Filled 2019-04-21: qty 1

## 2019-04-21 MED ORDER — SODIUM CHLORIDE 0.9% FLUSH: 10.0000 mL | INTRAVENOUS | Status: DC | PRN

## 2019-04-21 MED ORDER — HYDRALAZINE HCL 20 MG/ML IJ SOLN
INTRAMUSCULAR | Status: AC
  Filled 2019-04-21: qty 1

## 2019-04-21 NOTE — Progress Notes (Signed)
[facility] Progress Note Patient Name: Lawrence Olson DOB: 09-21-1950 [name] [id number]   Date of Service  04/21/2019  HPI/Events of Note  Multiple issues: 1. Fever to 102.2 F - Request for increased Tylenol dose to 1 gm. AST and ALT are both elevated. Creatinine = 2.0. Therefore, can't use Tylenol or Motrin and 2. Blood glucose = 273.  eICU Interventions  Will order: 1. D/C Tylenol. 2. Blood cultures X 2 now. 3. Tracheal aspirate culture now.  4. Ice packs PRN. 5. Increase to Q 4 hour moderate Novolog SSI.      Intervention Category Major Interventions: Infection - evaluation and management  Lawrence Olson 04/21/2019, 8:52 PM

## 2019-04-21 NOTE — Progress Notes (Signed)
NAMEJoshus Olson, [name]  [id number], DOB:  07/13/1951, LOS: 0 ADMISSION DATE:  04/16/2019, CONSULTATION DATE:  04/16/2019 REFERRING MD:  Claiborne Billings CHIEF COMPLAINT:  STEMI, resp failure  Brief History   68yo M out of hospital cardiac arrest with ROSC, intubated, found to have STEMI, taken emergently to cath lab. Proximal LAD lesion. PCCM consulted for management assistance.   Past Medical History  Unknown  Significant Hospital Events   8/24: Cath Lab. PTCI unsuccessful despite multiple attempts.  RA 13 RV 42/16 PA 40/26 (31) PCWP 18 Pa sat 50%  Fick 2.7/1.3 CPO 0.6 Impella placed. VT /p same requiring defib.  8/27: Remains comatose with decorticate posturing.  Still on Impella support.  Weaning vasoactive drips.  At this point major question is neurological recovery.  Would need further cardiac intervention if he were to recover neurologically.  Neurology has been consulted  8/28 - impellla removed.  Consults:  Cardiology PCCM Heart failure   Procedures:  8/24: PTCI unsuccessful 8/14: Impella L groin>> 8/24: 7.5 ETT>> 8/24: Right femoral venous sheath with PA cath>> 8/24: Right femoral arterial sheath>> 8/24: Left femoral arterial sheath>> 8/24: L IJ CVC >>>  Significant Diagnostic Tests:  Chest x-ray 8/27: Bilateral interstitial edema Echocardiogram 8/27: Well-positioned Impella cannula.  Continued severely depressed LV function with anteroapical wall motion abnormality.  Micro Data:  [id number]: Negative 8/24 blood cultures x2>>> negative Urine culture 8/27:>>> Negative MRSA swab: Positive  Antimicrobials:  Vancomycin and cefepime from 8/25 through 826, again 8/28 >>  Interim history/subjective:   8/29 - s/p impella removal yesterday. Off epi gttt, off levophed gtt. Remains on milrinone. Continues on amio gtt.,  T max 100.6 yesrerday - on Vanc and cefepime. Daughter says he moves extremities in responds to [place] wants to continue  milrinone, and give lasix toda. Also on amio gtt. Ok with dc of [place]. On fent gtt and versed gtt   Needing 90% fio2 and 5 peep  Objective    Today's Vitals   04/21/19 0600 04/21/19 0700 04/21/19 0750 04/21/19 0800  BP: (!) 106/57 120/67  114/65  Pulse: 69 72 73 72  Resp: (!) 21 (!) 21 (!) 21 (!) 21  Temp: 99.7 F (37.6 C) 99.7 F (37.6 C) 99.9 F (37.7 C) 99.9 F (37.7 C)  TempSrc:      SpO2: 90% 91% 93% 93%  Weight:      Height:      PainSc:       Vent Mode: PRVC FiO2 (%):  [50 %-90 %] 90 % Set Rate:  [21 bmp] 21 bmp Vt Set:  [510 mL] 510 mL PEEP:  [5 cmH20] 5 cmH20 Plateau Pressure:  [20 cmH20-24 cmH20] 24 cmH20  PAP: (33-56)/(13-31) 39/15 CVP:  [9 mmHg-20 mmHg] 15 mmHg CO:  [4.4 L/min-6.7 L/min] 6.7 L/min CI:  [3 L/min/m2-3.8 L/min/m2] 3.4 L/min/m2   Examination: General Appearance:  Looks criticall ill  Head:  Normocephalic, without obvious abnormality, atraumatic Eyes:  PERRL - yes, conjunctiva/corneas - muddy     Ears:  Normal external ear canals, both ears Nose:  G tube - no Throat:  ETT TUBE - no , OG tube - no Neck:  Supple,  No enlargement/tenderness/nodules Lungs: Clear to auscultation bilaterally, Ventilator   Synchrony - yes, fio2 90%, peep 5 Heart:  S1 and S2 normal, no murmur, CVP - x.   Abdomen:  Soft, no masses, no organomegaly Genitalia / Rectal:  Not done Extremities:  Extremities- intact Skin:  ntact in  exposed areas . Sacral area - not intact  Neurologic:  Sedation -  fent gtt versed gtt -> RASS - -4   LABS    PULMONARY Recent Labs  Lab 04/16/19 1411  04/16/19 1547 04/16/19 1556 04/16/19 1701 04/16/19 1850  04/16/19 2102  04/17/19 0620 04/17/19 1306  04/19/19 0434 04/19/19 1440 04/20/19 0510 04/21/19 0405 04/21/19 0800  PHART 7.192*  --  7.225*  --  7.256* 7.283*  --  7.305*  --   --   --   --   --   --   --   --   --   PCO2ART 49.6*  --  51.1*  --  47.6 51.6*  --  47.7  --   --   --   --   --   --   --   --   --   PO2ART  75.0*  --  192.0*  --  73.0* 391.0*  --  279.0*  --   --   --   --   --   --   --   --   --   HCO3 19.0*   < > 21.2 23.6 21.2 24.5  --  23.8  --  20.7  --   --   --   --   --   --   --   TCO2 21*  20*   < > 23 25 23 26   --  25  --  22 21*  --   --   --   --   --   --   O2SAT 91.0   < > 99.0 51.0 92.0 100.0   < > 100.0   < > 96.0  --    < > 51.0 66.3 81.6 53.0 73.5   < > = values in this interval not displayed.    CBC Recent Labs  Lab 04/20/19 0452 04/20/19 2032 04/21/19 0345  HGB 7.4* 7.3* 8.2*  HCT 22.6* 21.6* 24.9*  WBC 16.0* 13.5* 14.2*  PLT 124* 132* 129*    COAGULATION Recent Labs  Lab 04/16/19 1335 04/16/19 1921  INR 1.3* 1.7*    CARDIAC  No results for input(s): TROPONINI in the last 168 hours. No results for input(s): PROBNP in the last 168 hours.   CHEMISTRY Recent Labs  Lab 04/17/19 0547  04/17/19 1254 04/17/19 1306 04/18/19 0253 04/19/19 0359 04/20/19 0452 04/21/19 0345  NA 140   < > 142 146* 139 140 138 140  K 4.0   < > 3.9 3.9 3.5 3.8 3.7 3.9  CL 110  --  112* 109 112* 111 107 106  CO2 19*  --  21*  --  21* 23 25 26   GLUCOSE 251*  --  143* 136* 179* 217* 267* 273*  BUN 27*  --  32* 34* 38* 49* 51* 54*  CREATININE 1.80*  --  2.06* 1.90* 2.06* 2.09* 1.82* 2.00*  CALCIUM 7.3*  --  7.5*  --  7.5* 7.7* 8.0* 8.3*  MG 2.0  --  1.9  --   --  1.8 2.2  --   PHOS 1.3*  --   --   --   --   --   --   --    < > = values in this interval not displayed.   Estimated Creatinine Clearance: 38.6 mL/min (A) (by C-G formula based on SCr of 2 mg/dL (H)).   LIVER Recent Labs  Lab 04/16/19 1335 04/16/19 1921 04/18/19 6767  AST 166*  --  102*  ALT 211*  --  85*  ALKPHOS 83  --  39  BILITOT 0.8  --  1.4*  PROT 6.3*  --  4.6*  ALBUMIN 3.5  --  2.3*  INR 1.3* 1.7*  --      INFECTIOUS Recent Labs  Lab 04/17/19 0103 04/17/19 0547 04/18/19 1006  LATICACIDVEN 6.8* 4.5* 0.9     ENDOCRINE CBG (last 3)  Recent Labs    04/21/19 0024 04/21/19 0351  04/21/19 0755  GLUCAP 252* 254* 210*         IMAGING x48h  - image(s) personally visualized  -   highlighted in bold Dg Chest Port 1 View  Result Date: 04/20/2019 CLINICAL DATA:  Acute respiratory failure. EXAM: PORTABLE CHEST 1 VIEW COMPARISON:  04/19/2019 FINDINGS: Endotracheal tube terminates 3.2 cm above the carina. Left jugular catheter terminates over the upper SVC. An Impella device is unchanged, as is an inferior approach catheter terminating over the left pulmonary artery. The cardiac silhouette remains mildly enlarged. Lung volumes are unchanged with slight worsening of mixed interstitial and airspace opacity bilaterally. No large pleural effusion or pneumothorax is identified. IMPRESSION: Slight worsening of bilateral lung opacities likely reflecting edema. Electronically Signed   By: Logan Bores M.D.   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  Patient was taken to the cath lab and found to have 100% proximal LAD lesion. PCI unsuccessful so impella placed.    Acute systolic heart failure and cardiogenic shock status post cardiac arrest/ VF- r/t anterior MI, CAD with acute STEMI - 100% prox LAD - PCI unsuccessful    8/29 -shock state improving .Now on ionotropes with  SBP hgih and 1 dose of hydralazine given   Plan Per Cardiogy RN asked to direct hypertension management to cardiology (hydralazine discontinued)  Acute hypoxic respiratory failure - r/t MI, cardiac arrest, cardiogenic shock  04/21/2019 - does not meet SBT criteria. Fio2 90% need   Plan Full ventilator support Diuresis today Full sedation vacation and weaning  When ablke   AKI - s/p cardiac arrest   04/21/2019 -unchanged  around 2mg  %   Plan Continue diuresis  Anemia of critical illness, continues to have intermittent hematuria while on aggrastat heparin gtt for impella)  04/21/2019 - no  Hematuria now off aggrastate and heparin gtt   Plan Hgb goal > 8gm% in view of MI, cardioogenic shock or >= 7.5 Continue sq lovenox    Mildly elevated LFTs with icteric sclera trending down  04/21/2019 - impropving as of 04/18/2019  Plan Continue to trend LFTs for now will defer ultrasound imaging at this point given poor neurological exam  Acute encephalopathy - post cardiac arrest. (earlier ccm note: On further questioning it appears as though time to review of spontaneous circulation may have been longer than initially felt.  He opens his eyes however this seems to be spontaneously, and is exhibiting decorticate posturing raising concern for anoxic injury certainly nonpurposeful  04/21/2019 - per daughter at bedside he wiggles toes to command when spoken to in [place] Full sedation vacation  CT head (cards ordered)   Hyperglycemia  Plan Add sliding scale insulin   Best practice  Sedation: PAD protocol Nutrition: tubefeeds SUP: H2B DVT prophylaxis: IV heparin Glycemic control: SSI   FamilyL: daughter updated at bedside     [place]   The patient Lawrence Olson is critically ill with multiple organ systems failure and requires high complexity decision making for assessment and support, frequent evaluation and titration of therapies, application of advanced monitoring technologies and extensive interpretation of multiple databases.   Critical Care Time devoted to patient care services described in this note is  30  Minutes. This time reflects time of care of this signee Dr Brand Males. This critical care time does not reflect procedure time, or teaching time or supervisory time of PA/NP/Med student/Med Resident etc but could involve care discussion time     Dr. Brand Males, M.D., [facility].C.P Pulmonary and Critical Care Medicine Staff Physician [facility] Pager: [phone], If no answer or between  15:00h - 7:00h: call [phone]  04/21/2019 10:20 AM

## 2019-04-21 NOTE — Progress Notes (Addendum)
Subjective: Intubated and sedated on Fentanyl and low-dose Versed gtt.   Objective: Current vital signs: BP 122/64   Pulse 74   Temp 99.7 F (37.6 C)   Resp (!) 21   Ht '5\' 6"'  (1.676 m)   Wt 97.1 kg   SpO2 94%   BMI 34.55 kg/m  Vital signs in last 24 hours: Temp:  [99 F (37.2 C)-100.6 F (38.1 C)] 99.7 F (37.6 C) (08/29 0900) Pulse Rate:  [67-80] 74 (08/29 1000) Resp:  [21-24] 21 (08/29 1000) BP: (88-122)/(53-67) 122/64 (08/29 1000) SpO2:  [89 %-96 %] 94 % (08/29 1000) Arterial Line BP: (101-175)/(48-65) 175/65 (08/29 1000) FiO2 (%):  [50 %-90 %] 90 % (08/29 0750) Weight:  [97.1 kg] 97.1 kg (08/29 0455)  Intake/Output from previous day: 08/28 0701 - 08/29 0700 In: 3419.1 [I.V.:1674.2; Blood:315; NG/GT:870; IV Piggyback:450] Out: 5409 [Urine:4180] Intake/Output this shift: No intake/output data recorded. Nutritional status:  Diet Order            Diet NPO time specified  Diet effective now             General exam: HEENT: [place]/AT Ext: Immobilizers have been placed to BLE.   Neurologic Exam: Ment: Intubated and sedated. No responses to any external stimuli. No longer exhibits the weak flexor posturing of BUE to light sternal rub which was seen yesterday.  CN: Pupils equal at 3 mm and reactive. Does not open eyes to stimulation. No doll's eye reflex. Face flaccidly symmetric. Cough and gag are intact.  Motor:No movement of upper extremities to light noxious stimuli. Legs continue to be with immobilizers due to risk of hematoma formation at groin level in the context of recently pulled Impella catheter device.    Lab Results: Results for orders placed or performed during the hospital encounter of 04/16/19 (from the past 48 hour(s))  Glucose, capillary     Status: Abnormal   Collection Time: 04/19/19 11:59 AM  Result Value Ref Range   Glucose-Capillary 197 (H) 70 - 99 mg/dL  POCT Activated clotting time     Status: None   Collection Time: 04/19/19 12:30 PM   Result Value Ref Range   Activated Clotting Time 175 seconds  .Cooxemetry Panel (carboxy, met, total hgb, O2 sat)     Status: Abnormal   Collection Time: 04/19/19  2:40 PM  Result Value Ref Range   Total hemoglobin 7.9 (L) 12.0 - 16.0 g/dL   O2 Saturation 66.3 %   Carboxyhemoglobin 1.6 (H) 0.5 - 1.5 %   Methemoglobin 1.5 0.0 - 1.5 %  POCT Activated clotting time     Status: None   Collection Time: 04/19/19  4:54 PM  Result Value Ref Range   Activated Clotting Time 164 seconds  CBC     Status: Abnormal   Collection Time: 04/19/19  5:17 PM  Result Value Ref Range   WBC 17.0 (H) 4.0 - 10.5 K/uL   RBC 2.54 (L) 4.22 - 5.81 MIL/uL   Hemoglobin 8.0 (L) 13.0 - 17.0 g/dL    Comment: REPEATED TO VERIFY POST TRANSFUSION SPECIMEN    HCT 23.6 (L) 39.0 - 52.0 %   MCV 92.9 80.0 - 100.0 fL   MCH 31.5 26.0 - 34.0 pg   MCHC 33.9 30.0 - 36.0 g/dL   RDW 15.7 (H) 11.5 - 15.5 %   Platelets 138 (L) 150 - 400 K/uL   nRBC 0.2 0.0 - 0.2 %    Comment: Performed at [facility], [place]  [address], [place], [place] [address]  Glucose, capillary     Status: Abnormal   Collection Time: 04/19/19  8:43 PM  Result Value Ref Range   Glucose-Capillary 219 (H) 70 - 99 mg/dL  POCT Activated clotting time     Status: None   Collection Time: 04/19/19  8:44 PM  Result Value Ref Range   Activated Clotting Time 819 seconds    Comment: QUESTIONABLE RESULTS - CHARGE CREDITED Performed at [facility], [place] [address], [place], [place] [address]   POCT Activated clotting time     Status: None   Collection Time: 04/19/19  8:57 PM  Result Value Ref Range   Activated Clotting Time >1,000 seconds    Comment: QUESTIONABLE RESULTS - CHARGE CREDITED Performed at [facility], [place] [address] [place], [place] [address]   POCT Activated clotting time     Status: None   Collection Time: 04/19/19  9:25 PM  Result Value Ref Range   Activated Clotting Time 169 seconds   Glucose, capillary     Status: Abnormal   Collection Time: 04/20/19 12:52 AM  Result Value Ref Range   Glucose-Capillary 224 (H) 70 - 99 mg/dL  POCT Activated clotting time     Status: None   Collection Time: 04/20/19 12:54 AM  Result Value Ref Range   Activated Clotting Time 175 seconds  Basic metabolic panel     Status: Abnormal   Collection Time: 04/20/19  4:52 AM  Result Value Ref Range   Sodium 138 135 - 145 mmol/L   Potassium 3.7 3.5 - 5.1 mmol/L   Chloride 107 98 - 111 mmol/L   CO2 25 22 - 32 mmol/L   Glucose, Bld 267 (H) 70 - 99 mg/dL   BUN 51 (H) 8 - 23 mg/dL   Creatinine, Ser 1.82 (H) 0.61 - 1.24 mg/dL   Calcium 8.0 (L) 8.9 - 10.3 mg/dL   GFR calc non Af Amer 37 (L) >60 mL/min   GFR calc Af Amer 43 (L) >60 mL/min   Anion gap 6 5 - 15    Comment: Performed at [facility] [facility], [place] [address], [place], [place] [address]  Lactate dehydrogenase     Status: Abnormal   Collection Time: 04/20/19  4:52 AM  Result Value Ref Range   LDH 511 (H) 98 - 192 U/L    Comment: Performed at [place] [address] [place], [place] [address]  CBC     Status: Abnormal   Collection Time: 04/20/19  4:52 AM  Result Value Ref Range   WBC 16.0 (H) 4.0 - 10.5 K/uL   RBC 2.37 (L) 4.22 - 5.81 MIL/uL   Hemoglobin 7.4 (L) 13.0 - 17.0 g/dL   HCT 22.6 (L) 39.0 - 52.0 %   MCV 95.4 80.0 - 100.0 fL   MCH 31.2 26.0 - 34.0 pg   MCHC 32.7 30.0 - 36.0 g/dL   RDW 16.5 (H) 11.5 - 15.5 %   Platelets 124 (L) 150 - 400 K/uL    Comment: REPEATED TO VERIFY   nRBC 0.3 (H) 0.0 - 0.2 %    Comment: Performed at [facility], [place] [address], [place], [place] [address]  Magnesium     Status: None   Collection Time: 04/20/19  4:52 AM  Result Value Ref Range   Magnesium 2.2 1.7 - 2.4 mg/dL    Comment: Performed at [place] [address],  [place], [place] [address]  Glucose, capillary     Status: Abnormal   Collection Time: 04/20/19  5:05 AM  Result Value Ref Range    Glucose-Capillary 245 (H) 70 - 99 mg/dL  POCT Activated clotting time     Status: None   Collection Time: 04/20/19  5:06 AM  Result Value Ref Range   Activated Clotting Time 169 seconds  .Cooxemetry Panel (carboxy, met, total hgb, O2 sat)     Status: Abnormal   Collection Time: 04/20/19  5:10 AM  Result Value Ref Range   Total hemoglobin 8.1 (L) 12.0 - 16.0 g/dL   O2 Saturation 81.6 %   Carboxyhemoglobin 0.9 0.5 - 1.5 %   Methemoglobin 0.9 0.0 - 1.5 %  Glucose, capillary     Status: Abnormal   Collection Time: 04/20/19  8:08 AM  Result Value Ref Range   Glucose-Capillary 218 (H) 70 - 99 mg/dL  POCT Activated clotting time     Status: None   Collection Time: 04/20/19  8:10 AM  Result Value Ref Range   Activated Clotting Time 164 seconds  Glucose, capillary     Status: Abnormal   Collection Time: 04/20/19 11:37 AM  Result Value Ref Range   Glucose-Capillary 227 (H) 70 - 99 mg/dL  POCT Activated clotting time     Status: None   Collection Time: 04/20/19  1:07 PM  Result Value Ref Range   Activated Clotting Time 169 seconds  Glucose, capillary     Status: Abnormal   Collection Time: 04/20/19  3:43 PM  Result Value Ref Range   Glucose-Capillary 200 (H) 70 - 99 mg/dL  POCT Activated clotting time     Status: None   Collection Time: 04/20/19  3:45 PM  Result Value Ref Range   Activated Clotting Time 164 seconds  POCT Activated clotting time     Status: None   Collection Time: 04/20/19  5:44 PM  Result Value Ref Range   Activated Clotting Time 158 seconds  Glucose, capillary     Status: Abnormal   Collection Time: 04/20/19  8:26 PM  Result Value Ref Range   Glucose-Capillary 216 (H) 70 - 99 mg/dL  CBC     Status: Abnormal   Collection Time: 04/20/19  8:32 PM  Result Value Ref Range   WBC 13.5 (H) 4.0 - 10.5 K/uL   RBC 2.30 (L) 4.22 - 5.81 MIL/uL   Hemoglobin 7.3 (L) 13.0 - 17.0 g/dL   HCT 21.6 (L) 39.0 - 52.0 %   MCV 93.9 80.0 - 100.0 fL   MCH 31.7 26.0 - 34.0 pg   MCHC  33.8 30.0 - 36.0 g/dL   RDW 16.4 (H) 11.5 - 15.5 %   Platelets 132 (L) 150 - 400 K/uL    Comment: REPEATED TO VERIFY   nRBC 0.4 (H) 0.0 - 0.2 %    Comment: Performed at [facility], [place]. [address], [place], [place] [address]  Prepare RBC     Status: None   Collection Time: 04/20/19 10:02 PM  Result Value Ref Range   Order Confirmation      ORDER PROCESSED BY BLOOD BANK Performed at [facility], [place] [address] [place], [place] [address]   Glucose, capillary     Status: Abnormal   Collection Time: 04/21/19 12:24 AM  Result Value Ref Range   Glucose-Capillary 252 (H) 70 - 99 mg/dL  Basic metabolic panel     Status: Abnormal   Collection Time: 04/21/19  3:45 AM  Result Value Ref Range   Sodium 140 135 - 145 mmol/L   Potassium 3.9 3.5 - 5.1 mmol/L   Chloride 106 98 - 111 mmol/L   CO2 26 22 - 32 mmol/L   Glucose, Bld 273 (H) 70 - 99 mg/dL   BUN 54 (H) 8 - 23 mg/dL   Creatinine, Ser 2.00 (H) 0.61 - 1.24 mg/dL   Calcium 8.3 (L) 8.9 - 10.3 mg/dL   GFR calc non Af Amer 33 (L) >60 mL/min   GFR calc Af Amer 39 (L) >60 mL/min   Anion gap 8 5 - 15    Comment: Performed at [facility] [address] [place], [place] [address]  Lactate dehydrogenase     Status: Abnormal   Collection Time: 04/21/19  3:45 AM  Result Value Ref Range   LDH 462 (H) 98 - 192 U/L    Comment: Performed at [facility], [place] [address] [place], [place] [address]  CBC     Status: Abnormal   Collection Time: 04/21/19  3:45 AM  Result Value Ref Range   WBC 14.2 (H) 4.0 - 10.5 K/uL   RBC 2.62 (L) 4.22 - 5.81 MIL/uL   Hemoglobin 8.2 (L) 13.0 - 17.0 g/dL   HCT 24.9 (L) 39.0 - 52.0 %   MCV 95.0 80.0 - 100.0 fL   MCH 31.3 26.0 - 34.0 pg   MCHC 32.9 30.0 - 36.0 g/dL   RDW 16.1 (H) 11.5 - 15.5 %   Platelets 129 (L) 150 - 400 K/uL    Comment: REPEATED TO VERIFY   nRBC 0.6 (H) 0.0 - 0.2 %    Comment: Performed at [facility], [place] [address] [place], [place] [address]  Glucose,  capillary     Status: Abnormal   Collection Time: 04/21/19  3:51 AM  Result Value Ref Range   Glucose-Capillary 254 (H) 70 - 99 mg/dL  .Cooxemetry Panel (carboxy, met, total hgb, O2 sat)     Status: Abnormal   Collection Time: 04/21/19  4:05 AM  Result Value Ref Range   Total hemoglobin 9.0 (L) 12.0 - 16.0 g/dL   O2 Saturation 53.0 %   Carboxyhemoglobin 1.1 0.5 - 1.5 %   Methemoglobin 0.8 0.0 - 1.5 %  Glucose, capillary     Status: Abnormal   Collection Time: 04/21/19  7:55 AM  Result Value Ref Range   Glucose-Capillary 210 (H) 70 - 99 mg/dL  .Cooxemetry Panel (carboxy, met, total hgb, O2 sat)     Status: Abnormal   Collection Time: 04/21/19  8:00 AM  Result Value Ref Range   Total hemoglobin 9.9 (L) 12.0 - 16.0 g/dL   O2 Saturation 73.5 %   Carboxyhemoglobin 1.4 0.5 - 1.5 %   Methemoglobin 1.2 0.0 - 1.5 %    Recent Results (from the past 240 hour(s))  SARS CORONAVIRUS 2 (TAT 6-12 HRS) Nasal Swab Aptima Multi Swab     Status: None   Collection Time: 04/16/19  1:29 PM   Specimen: Aptima Multi Swab; Nasal Swab  Result Value Ref Range Status   SARS Coronavirus 2 NEGATIVE NEGATIVE Final    Comment: (NOTE) SARS-CoV-2 target nucleic acids are NOT DETECTED. The SARS-CoV-2 RNA is generally detectable in upper and lower respiratory specimens during the acute phase of infection. Negative results do not preclude SARS-CoV-2 infection, do not rule out co-infections with other pathogens, and should not be used as the sole basis for treatment  or other patient management decisions. Negative results must be combined with clinical observations, patient history, and epidemiological information. The expected result is Negative. Fact Sheet for Patients: SugarRoll.be Fact Sheet for Healthcare Providers: https://www.woods-mathews.com/ This test is not yet approved or cleared by the [place] FDA and  has been authorized for detection and/or diagnosis  of SARS-CoV-2 by FDA under an Emergency Use Authorization (EUA). This EUA will remain  in effect (meaning this test can be used) for the duration of the COVID-19 declaration under Section 56 4(b)(1) of the Act, 21 U.S.C. section 360bbb-3(b)(1), unless the authorization is terminated or revoked sooner. Performed at [facility], [place] [address], [place], [place] [address]   MRSA PCR Screening     Status: Abnormal   Collection Time: 04/16/19 10:15 PM   Specimen: Nasopharyngeal  Result Value Ref Range Status   MRSA by PCR POSITIVE (A) NEGATIVE Final    Comment:        The GeneXpert MRSA Assay (FDA approved for NASAL specimens only), is one component of a comprehensive MRSA colonization surveillance program. It is not intended to diagnose MRSA infection nor to guide or monitor treatment for MRSA infections. RESULT CALLED TO, READ BACK BY AND VERIFIED WITH: l short rn 04/16/19 2345 jdw Performed at [facility], [place] [address], [place], [place] [address]   Culture, blood (Routine X 2) w Reflex to ID Panel     Status: None (Preliminary result)   Collection Time: 04/16/19 11:18 PM   Specimen: BLOOD RIGHT HAND  Result Value Ref Range Status   Specimen Description BLOOD RIGHT HAND  Final   Special Requests   Final    BOTTLES DRAWN AEROBIC ONLY Blood Culture adequate volume   Culture   Final    NO GROWTH 4 DAYS Performed at [facility], [place] [address], [place], [place] [address]    Report Status PENDING  Incomplete  Culture, blood (Routine X 2) w Reflex to ID Panel     Status: None (Preliminary result)   Collection Time: 04/16/19 11:25 PM   Specimen: BLOOD LEFT HAND  Result Value Ref Range Status   Specimen Description BLOOD LEFT HAND  Final   Special Requests   Final    BOTTLES DRAWN AEROBIC ONLY Blood Culture adequate volume   Culture   Final    NO GROWTH 4 DAYS Performed at [facility], [place] [address] [place], [place] [address]    Report Status  PENDING  Incomplete    Lipid Panel No results for input(s): CHOL, TRIG, HDL, CHOLHDL, VLDL, LDLCALC in the last 72 hours.  Studies/Results: Dg Chest Port 1 View  Result Date: 04/20/2019 CLINICAL DATA:  Acute respiratory failure. EXAM: PORTABLE CHEST 1 VIEW COMPARISON:  04/19/2019 FINDINGS: Endotracheal tube terminates 3.2 cm above the carina. Left jugular catheter terminates over the upper SVC. An Impella device is unchanged, as is an inferior approach catheter terminating over the left pulmonary artery. The cardiac silhouette remains mildly enlarged. Lung volumes are unchanged with slight worsening of mixed interstitial and airspace opacity bilaterally. No large pleural effusion or pneumothorax is identified. IMPRESSION: Slight worsening of bilateral lung opacities likely reflecting edema. Electronically Signed   By: Logan Bores M.D.   On: 04/20/2019 08:44    Medications:  Scheduled: . sodium chloride   Intravenous Once  . aspirin  81 mg Oral Daily  . atorvastatin  80 mg Per Tube q1800  . chlorhexidine gluconate (MEDLINE KIT)  15 mL Mouth Rinse BID  . Chlorhexidine Gluconate Cloth  6 each Topical Daily  . cisatracurium  10 mg Intravenous Once  . enoxaparin (LOVENOX) injection  40 mg Subcutaneous Q24H  . famotidine  20 mg Per Tube Daily  . feeding supplement (PRO-STAT SUGAR FREE 64)  30 mL Per Tube BID  . fentaNYL (SUBLIMAZE) injection  25 mcg Intravenous Once  . furosemide  80 mg Intravenous BID  . hydrALAZINE      . insulin aspart  0-9 Units Subcutaneous Q4H  . insulin detemir  5 Units Subcutaneous BID  . mouth rinse  15 mL Mouth Rinse 10 times per day  . potassium chloride  40 mEq Oral Daily  . sodium chloride flush  3 mL Intravenous Q12H   Continuous: . sodium chloride 10 mL/hr at 04/21/19 0700  . amiodarone 30 mg/hr (04/21/19 0749)  . ceFEPime (MAXIPIME) IV Stopped (04/20/19 2106)  . feeding supplement (VITAL AF 1.2 CAL) 1,000 mL (04/20/19 1301)  . fentaNYL infusion  INTRAVENOUS 350 mcg/hr (04/21/19 0700)  . levETIRAcetam Stopped (04/20/19 2253)  . midazolam 2 mg/hr (04/21/19 0700)  . milrinone 0.25 mcg/kg/min (04/21/19 0700)  . norepinephrine (LEVOPHED) Adult infusion Stopped (04/20/19 0622)  . 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  1. Examreveals intact brain stemreflexes and minimal cortical function. On light sedation with Versed at 2 mg/hr this AM. His exam is slightly worsened from yesterday, but unclear if this is due to sedation.   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  Recommendations: 1.No strong indication for anticonvulsant based on negative EEG, but given high risk of development of seizures in anoxic brain injury, Keppra was started at 500 mg BID (He has decreased renal function and if seizures occur on Keppra at 500 mg BID, would add Vimpat rather than increasing Keppra). Transaminases are elevated, therefore VPA or Dilantin are not optimal choices. 2. CT head when stable to assess for anoxic brain injury.   LOS: 5 days   35 minutes spent in the neurological evaluation and management of this critically ill patient.   '@Electronically'  signed: Dr. Kerney Elbe 04/21/2019  10:46 AM

## 2019-04-21 NOTE — Progress Notes (Addendum)
Patient ID: Lawrence Olson, male   DOB: 1951/06/09, 68 y.o.   [name] [id number]    Advanced Heart Failure Team  Note   Primary Physician: Patient, No Pcp Per PCP-Cardiologist:  No primary care provider on file.  Reason for Consultation: Cardiogenic shock  HPI:    Not awake or moving for me.  Per neurology note, intact brainstem function but minimal cortical function detectable.  However, some report that he responds to family speaking [place].   Impella removed 8/28.   He is off epinephrine and norepinephrine, remains on milrinone 0.25 mcg. He continues on amiodarone gtt.    Swan #s   RA 18 PA 46/18 Fick CI 3.4  Co-ox  53%  Tm 100.6 yesterday evening, 99 this am.  WBCs 14.  He is on vancomycin/cefepime.     Objective:    Vital Signs:   Temp:  [99 F (37.2 C)-100.6 F (38.1 C)] 99.7 F (37.6 C) (08/29 0700) Pulse Rate:  [67-89] 72 (08/29 0700) Resp:  [20-24] 21 (08/29 0700) BP: (88-120)/(53-67) 120/67 (08/29 0700) SpO2:  [84 %-98 %] 91 % (08/29 0700) Arterial Line BP: (101-164)/(48-72) 161/59 (08/29 0700) FiO2 (%):  [50 %-70 %] 70 % (08/29 0422) Weight:  [97.1 kg] 97.1 kg (08/29 0455) Last BM Date: (PTA)  Weight change: Filed Weights   04/19/19 0500 04/20/19 0500 04/21/19 0455  Weight: 98 kg 96.4 kg 97.1 kg    Intake/Output:   Intake/Output Summary (Last 24 hours) at 04/21/2019 0742 Last data filed at 04/21/2019 0700 Gross per 24 hour  Intake 3419.12 ml  Output 4180 ml  Net -760.88 ml      Physical Exam    General: Intubated/sedated Neck: Thick, JVP difficult, no thyromegaly or thyroid nodule.  Lungs: Decreased BS at bases.  CV: Nondisplaced PMI.  Heart regular S1/S2, no S3/S4, no murmur.  1+ edema to knees. Abdomen: Soft, nontender, no hepatosplenomegaly, no distention.  Skin: Intact without lesions or rashes.  Neurologic: No response to my voice/commands.  Extremities: No clubbing or cyanosis.  HEENT: Normal.    Telemetry   NSR 70s with  occasional PVCs Personally reviewed  Labs   Basic Metabolic Panel: Recent Labs  Lab 04/17/19 0547  04/17/19 1254 04/17/19 1306 04/18/19 0253 04/19/19 0359 04/20/19 0452 04/21/19 0345  NA 140   < > 142 146* 139 140 138 140  K 4.0   < > 3.9 3.9 3.5 3.8 3.7 3.9  CL 110  --  112* 109 112* 111 107 106  CO2 19*  --  21*  --  21* _0 GLUCOSE 251*  --  143* 136* 179* 217* 267* 273*  BUN 27*  --  32* 34* 38* 49* 51* 54*  CREATININE 1.80*  --  2.06* 1.90* 2.06* 2.09* 1.82* 2.00*  CALCIUM 7.3*  --  7.5*  --  7.5* 7.7* 8.0* 8.3*  MG 2.0  --  1.9  --   --  1.8 2.2  --   PHOS 1.3*  --   --   --   --   --   --   --    < > = values in this interval not displayed.    Liver Function Tests: Recent Labs  Lab 04/16/19 1335 04/18/19 0946  AST 166* 102*  ALT 211* 85*  ALKPHOS 83 39  BILITOT 0.8 1.4*  PROT 6.3* 4.6*  ALBUMIN 3.5 2.3*   No results for input(s): LIPASE, AMYLASE in the last 168 hours. No results for  input(s): AMMONIA in the last 168 hours.  CBC: Recent Labs  Lab 04/16/19 1335  04/19/19 0509 04/19/19 1717 04/20/19 0452 04/20/19 2032 04/21/19 0345  WBC 27.5*   < > 19.3* 17.0* 16.0* 13.5* 14.2*  NEUTROABS 19.3*  --   --   --   --   --   --   HGB 13.8   < > 6.5* 8.0* 7.4* 7.3* 8.2*  HCT 42.9   < > 19.9* 23.6* 22.6* 21.6* 24.9*  MCV 100.2*   < > 98.0 92.9 95.4 93.9 95.0  PLT 331   < > 157 138* 124* 132* 129*   < > = values in this interval not displayed.    Cardiac Enzymes: No results for input(s): CKTOTAL, CKMB, CKMBINDEX, TROPONINI in the last 168 hours.  BNP: BNP (last 3 results) No results for input(s): BNP in the last 8760 hours.  ProBNP (last 3 results) No results for input(s): PROBNP in the last 8760 hours.   CBG: Recent Labs  Lab 04/20/19 1137 04/20/19 1543 04/20/19 2026 04/21/19 0024 04/21/19 0351  GLUCAP 227* 200* 216* 252* 254*    Coagulation Studies: No results for input(s): LABPROT, INR in the last 72 hours.   Imaging   No  results found.   Medications:     Current Medications: . sodium chloride   Intravenous Once  . aspirin  81 mg Oral Daily  . atorvastatin  80 mg Per Tube q1800  . chlorhexidine gluconate (MEDLINE KIT)  15 mL Mouth Rinse BID  . Chlorhexidine Gluconate Cloth  6 each Topical Daily  . cisatracurium  10 mg Intravenous Once  . enoxaparin (LOVENOX) injection  40 mg Subcutaneous Q24H  . famotidine  20 mg Per Tube Daily  . feeding supplement (PRO-STAT SUGAR FREE 64)  30 mL Per Tube BID  . fentaNYL (SUBLIMAZE) injection  25 mcg Intravenous Once  . furosemide  80 mg Intravenous BID  . insulin aspart  0-9 Units Subcutaneous Q4H  . insulin detemir  5 Units Subcutaneous BID  . mouth rinse  15 mL Mouth Rinse 10 times per day  . mupirocin ointment  1 application Nasal BID  . potassium chloride  40 mEq Oral Daily  . sodium chloride flush  3 mL Intravenous Q12H    Infusions: . sodium chloride 10 mL/hr at 04/21/19 0700  . amiodarone 30 mg/hr (04/21/19 0700)  . ceFEPime (MAXIPIME) IV Stopped (04/20/19 2106)  . feeding supplement (VITAL AF 1.2 CAL) 1,000 mL (04/20/19 1301)  . fentaNYL infusion INTRAVENOUS 350 mcg/hr (04/21/19 0700)  . levETIRAcetam Stopped (04/20/19 2253)  . midazolam 2 mg/hr (04/21/19 0700)  . milrinone 0.25 mcg/kg/min (04/21/19 0700)  . norepinephrine (LEVOPHED) Adult infusion Stopped (04/20/19 0622)  . potassium chloride    . vancomycin Stopped (04/20/19 2034)    Assessment/Plan   1. CAD: Anterolateral STEMI, cath 8/24 showed occluded proximal LAD, unable to open.  Once we see how he recovers neurologically, will need to decide on feasibility of re-attempt PCI versus CABG.  - Continue ASA + statin.  2. Cardiogenic shock: Ischemic cardiomyopathy.  Echo with EF 25%, normal RV.  Impella removed 8/28.  Now remains on milrinone 0.25.  Good cardiac index by Luiz Blare, 3.1 this morning.  CVP 18. Creatinine 1.8 => 2.  - Continue milrinone 0.25, will repeat co-ox which was low this  morning despite good CO reading from [facility].  - Lasix 80 mg IV bid today.   - Can remove Swan.  3. AKI: Suspect hemodynamic.  Creatinine 1.8 => 2.  4. Cardiac arrest/VF: 20 minutes downtime, concerned for anoxic brain injury.  Neurology following, per yesterday's note he seems to have intact brainstem function but minimal cortical function. EEG suggestive of diffuse encephalopathy.  - Continue amiodarone gtt.  - Will get head CT today.  - On Keppra per neurology.  - Continue neuro assessment.  5. ID: Febrile yesterday evening, afebrile this morning.  WBCs down to 14.  Has had multiple line.  - Continue vancomycin/Cefepime, pending culture data.  - Remove Swan today.  6. Anemia: Hgb down to 6.5-> 2u RBC -> 7.4 -> 8.2.  - Transfuse more as needed. Keep hgb >= 7.5 7. Acute hypoxic respiratory failure - CCM following. Diurese today to allow vent weaning.  8. DVT prophylaxis: Lovenox.   CRITICAL CARE Performed by: Loralie Champagne  Total critical care time: 40 minutes  Critical care time was exclusive of separately billable procedures and treating other patients.  Critical care was necessary to treat or prevent imminent or life-threatening deterioration.  Critical care was time spent personally by me (independent of midlevel providers or residents) on the following activities: development of treatment plan with patient and/or surrogate as well as nursing, discussions with consultants, evaluation of patient's response to treatment, examination of patient, obtaining history from patient or surrogate, ordering and performing treatments and interventions, ordering and review of laboratory studies, ordering and review of radiographic studies, pulse oximetry and re-evaluation of patient's condition.   Loralie Champagne 04/21/2019 7:42 AM

## 2019-04-22 ENCOUNTER — Inpatient Hospital Stay (HOSPITAL_COMMUNITY): Payer: PPO

## 2019-04-22 LAB — BASIC METABOLIC PANEL
Anion gap: 12 (ref 5–15)
BUN: 64 mg/dL — ABNORMAL HIGH (ref 8–23)
CO2: 27 mmol/L (ref 22–32)
Calcium: 8.7 mg/dL — ABNORMAL LOW (ref 8.9–10.3)
Chloride: 102 mmol/L (ref 98–111)
Creatinine, Ser: 2.36 mg/dL — ABNORMAL HIGH (ref 0.61–1.24)
GFR calc Af Amer: 32 mL/min — ABNORMAL LOW (ref >60)
GFR calc non Af Amer: 27 mL/min — ABNORMAL LOW (ref >60)
Glucose, Bld: 267 mg/dL — ABNORMAL HIGH (ref 70–99)
Potassium: 4.3 mmol/L (ref 3.5–5.1)
Sodium: 141 mmol/L (ref 135–145)

## 2019-04-22 LAB — POCT I-STAT 7, (LYTES, BLD GAS, ICA,H+H)
Acid-Base Excess: 1 mmol/L (ref 0.0–2.0)
Bicarbonate: 27 mmol/L (ref 20.0–28.0)
Calcium, Ion: 1.23 mmol/L (ref 1.15–1.40)
HCT: 26 % — ABNORMAL LOW (ref 39.0–52.0)
Hemoglobin: 8.8 g/dL — ABNORMAL LOW (ref 13.0–17.0)
O2 Saturation: 85 %
Patient temperature: 38.1
Potassium: 4.1 mmol/L (ref 3.5–5.1)
Sodium: 142 mmol/L (ref 135–145)
TCO2: 28 mmol/L (ref 22–32)
pCO2 arterial: 52.6 mmHg — ABNORMAL HIGH (ref 32.0–48.0)
pH, Arterial: 7.323 — ABNORMAL LOW (ref 7.350–7.450)
pO2, Arterial: 59 mmHg — ABNORMAL LOW (ref 83.0–108.0)

## 2019-04-22 LAB — MAGNESIUM: Magnesium: 2.2 mg/dL (ref 1.7–2.4)

## 2019-04-22 LAB — LACTATE DEHYDROGENASE: LDH: 482 U/L — ABNORMAL HIGH (ref 98–192)

## 2019-04-22 LAB — CBC
HCT: 27.6 % — ABNORMAL LOW (ref 39.0–52.0)
Hemoglobin: 9.1 g/dL — ABNORMAL LOW (ref 13.0–17.0)
MCH: 31.4 pg (ref 26.0–34.0)
MCHC: 33 g/dL (ref 30.0–36.0)
MCV: 95.2 fL (ref 80.0–100.0)
Platelets: 180 10*3/uL (ref 150–400)
RBC: 2.9 MIL/uL — ABNORMAL LOW (ref 4.22–5.81)
RDW: 16.4 % — ABNORMAL HIGH (ref 11.5–15.5)
WBC: 21 10*3/uL — ABNORMAL HIGH (ref 4.0–10.5)
nRBC: 0.5 % — ABNORMAL HIGH (ref 0.0–0.2)

## 2019-04-22 LAB — COOXEMETRY PANEL
Carboxyhemoglobin: 1.3 % (ref 0.5–1.5)
Carboxyhemoglobin: 1.1 % (ref 0.5–1.5)
Methemoglobin: 0.8 % (ref 0.0–1.5)
Methemoglobin: 1.1 % (ref 0.0–1.5)
O2 Saturation: 90 %
O2 Saturation: 83.4 %
Total hemoglobin: 7.1 g/dL — ABNORMAL LOW (ref 12.0–16.0)
Total hemoglobin: 8.6 g/dL — ABNORMAL LOW (ref 12.0–16.0)

## 2019-04-22 LAB — HEPATIC FUNCTION PANEL
ALT: 48 U/L — ABNORMAL HIGH (ref 0–44)
AST: 45 U/L — ABNORMAL HIGH (ref 15–41)
Albumin: 2.3 g/dL — ABNORMAL LOW (ref 3.5–5.0)
Alkaline Phosphatase: 70 U/L (ref 38–126)
Bilirubin, Direct: 0.4 mg/dL — ABNORMAL HIGH (ref 0.0–0.2)
Indirect Bilirubin: 0.8 mg/dL (ref 0.3–0.9)
Total Bilirubin: 1.2 mg/dL (ref 0.3–1.2)
Total Protein: 5.9 g/dL — ABNORMAL LOW (ref 6.5–8.1)

## 2019-04-22 LAB — GLUCOSE, CAPILLARY
Glucose-Capillary: 219 mg/dL — ABNORMAL HIGH (ref 70–99)
Glucose-Capillary: 223 mg/dL — ABNORMAL HIGH (ref 70–99)
Glucose-Capillary: 302 mg/dL — ABNORMAL HIGH (ref 70–99)
Glucose-Capillary: 235 mg/dL — ABNORMAL HIGH (ref 70–99)
Glucose-Capillary: 285 mg/dL — ABNORMAL HIGH (ref 70–99)

## 2019-04-22 LAB — PHOSPHORUS: Phosphorus: 3.6 mg/dL (ref 2.5–4.6)

## 2019-04-22 LAB — PROTIME-INR
INR: 1 (ref 0.8–1.2)
Prothrombin Time: 13.5 seconds (ref 11.4–15.2)

## 2019-04-22 LAB — PROCALCITONIN: Procalcitonin: 1 ng/mL

## 2019-04-22 MED ORDER — PIPERACILLIN-TAZOBACTAM 3.375 G IVPB
3.3750 g | Freq: Three times a day (TID) | INTRAVENOUS | Status: DC
  Administered 2019-04-22 – 2019-04-26 (×13): 3.375 g via INTRAVENOUS
  Filled 2019-04-22 (×14): qty 50

## 2019-04-22 MED ORDER — SODIUM CHLORIDE 0.9 % IV SOLN: 2.0000 g | INTRAVENOUS | Status: DC

## 2019-04-22 MED ORDER — FUROSEMIDE 10 MG/ML IJ SOLN
80.0000 mg | Freq: Once | INTRAMUSCULAR | Status: AC
  Administered 2019-04-22: 80 mg via INTRAVENOUS

## 2019-04-22 MED ORDER — MIDAZOLAM HCL 2 MG/2ML IJ SOLN
1.0000 mg | INTRAMUSCULAR | Status: DC | PRN
  Administered 2019-04-22: 19:00:00 2 mg via INTRAVENOUS
  Administered 2019-04-22: 21:00:00 4 mg via INTRAVENOUS
  Administered 2019-04-22 – 2019-04-23 (×7): 2 mg via INTRAVENOUS
  Administered 2019-04-24: 4 mg via INTRAVENOUS
  Administered 2019-04-24: 2 mg via INTRAVENOUS
  Administered 2019-04-24: 4 mg via INTRAVENOUS
  Administered 2019-04-24: 2 mg via INTRAVENOUS
  Administered 2019-04-24 (×2): 4 mg via INTRAVENOUS
  Filled 2019-04-22 (×2): qty 4
  Filled 2019-04-22 (×8): qty 2
  Filled 2019-04-22: qty 4
  Filled 2019-04-22 (×3): qty 2
  Filled 2019-04-22 (×2): qty 4

## 2019-04-22 MED ORDER — LACTATED RINGERS IV BOLUS
500.0000 mL | Freq: Once | INTRAVENOUS | Status: AC
  Administered 2019-04-22: 13:00:00 500 mL via INTRAVENOUS

## 2019-04-22 MED ORDER — FENTANYL CITRATE (PF) 100 MCG/2ML IJ SOLN
25.0000 ug | INTRAMUSCULAR | Status: DC | PRN
  Administered 2019-04-22 (×2): 100 ug via INTRAVENOUS
  Administered 2019-04-22: 50 ug via INTRAVENOUS
  Administered 2019-04-22 – 2019-04-24 (×5): 100 ug via INTRAVENOUS
  Administered 2019-04-24: 50 ug via INTRAVENOUS
  Administered 2019-04-25 – 2019-04-30 (×11): 100 ug via INTRAVENOUS
  Administered 2019-04-30: 50 ug via INTRAVENOUS
  Filled 2019-04-22 (×22): qty 2

## 2019-04-22 MED ORDER — DEXMEDETOMIDINE HCL IN NACL 200 MCG/50ML IV SOLN
0.4000 ug/kg/h | INTRAVENOUS | Status: DC
  Administered 2019-04-22: 0.4 ug/kg/h via INTRAVENOUS
  Administered 2019-04-22 – 2019-04-23 (×3): 1.2 ug/kg/h via INTRAVENOUS
  Filled 2019-04-22: qty 100
  Filled 2019-04-22 (×2): qty 50

## 2019-04-22 MED ORDER — LEVALBUTEROL HCL 0.63 MG/3ML IN NEBU: 0.6300 mg | INHALATION_SOLUTION | RESPIRATORY_TRACT | Status: DC | PRN

## 2019-04-22 MED ORDER — PHENYLEPHRINE HCL-NACL 10-0.9 MG/250ML-% IV SOLN
0.0000 ug/min | INTRAVENOUS | Status: DC
  Administered 2019-04-22: 20 ug/min via INTRAVENOUS
  Administered 2019-04-23: 04:00:00 40 ug/min via INTRAVENOUS
  Administered 2019-04-23: 09:00:00 20 ug/min via INTRAVENOUS
  Filled 2019-04-22: qty 500
  Filled 2019-04-22: qty 250

## 2019-04-22 NOTE — Progress Notes (Signed)
   Called emergently to the bedside because patient was desaturating.  At the time of my arrival respiratory therapy had suctioned the patient and removed a mucous plug and patient was improving and respiratory therapy was connecting the patient back on the ventilator   Plan Chest x-ray stat     SIGNATURE    Dr. Brand Males, M.D., F.C.C.P,  Pulmonary and Critical Care Medicine Staff Physician, [place] Director - Interstitial Lung Disease  Program  Pulmonary [place] [facility] [place], [place], [address]  Pager: [phone], If no answer or between  15:00h - 7:00h: call [phone] Telephone: [phone]  7:07 PM 04/22/2019

## 2019-04-22 NOTE — Progress Notes (Signed)
With Pt's temp decreasing to < 38 degrees celsius, BP dropped to SBP < 90 and MAP < 65 Quad strength Levophed restarted per MD orders. Will continue to monitor and assess.

## 2019-04-22 NOTE — Progress Notes (Signed)
Subjective: No acute events.   Objective: Current vital signs: BP (!) 102/57   Pulse 74   Temp 99.7 F (37.6 C)   Resp (!) 21   Ht 5' 6" (1.676 m)   Wt 93.3 kg   SpO2 100%   BMI 33.20 kg/m  Vital signs in last 24 hours: Temp:  [98.4 F (36.9 C)-102.2 F (39 C)] 99.7 F (37.6 C) (08/30 0700) Pulse Rate:  [71-108] 74 (08/30 0700) Resp:  [18-24] 21 (08/30 0700) BP: (91-148)/(45-73) 102/57 (08/30 0700) SpO2:  [90 %-100 %] 100 % (08/30 0700) Arterial Line BP: (90-198)/(44-94) 109/49 (08/30 0700) FiO2 (%):  [60 %-90 %] 80 % (08/30 0400) Weight:  [93.3 kg] 93.3 kg (08/30 0330)  Intake/Output from previous day: 08/29 0701 - 08/30 0700 In: 3644.2 [I.V.:1344.4; NG/GT:1700; IV Piggyback:599.8] Out: 2940 [Urine:2940] Intake/Output this shift: No intake/output data recorded. Nutritional status:  Diet Order            Diet NPO time specified  Diet effective now              General exam: HEENT: [place]/AT Ext: Warm and well perfused.   Neurologic Exam: Ment: Intubated and sedated. No responses to any external stimuli. No longer exhibits the weak flexor posturing of BUE to light sternal rub which was seen the day before yesterday. However, RN has seen this response during her shift.  CN: Pupils equal at 3 mm and reactive. Will open eyes to stimulation. Face symmetric. Blank stare. Will not move eyes towards or away from visual stimuli.   Motor/Sensory: Internal rotation of BLE to sternal rub on one trial, then with stereotyped flexion at hips and knees bilaterally and symmetrically with second sternal rub. Slight movement of upper extremities to light noxious stimuli also appears most consistent with posturing.    Lab Results: Results for orders placed or performed during the hospital encounter of 04/16/19 (from the past 48 hour(s))  Glucose, capillary     Status: Abnormal   Collection Time: 04/20/19  8:08 AM  Result Value Ref Range   Glucose-Capillary 218 (H) 70 - 99 mg/dL   POCT Activated clotting time     Status: None   Collection Time: 04/20/19  8:10 AM  Result Value Ref Range   Activated Clotting Time 164 seconds  Glucose, capillary     Status: Abnormal   Collection Time: 04/20/19 11:37 AM  Result Value Ref Range   Glucose-Capillary 227 (H) 70 - 99 mg/dL  POCT Activated clotting time     Status: None   Collection Time: 04/20/19  1:07 PM  Result Value Ref Range   Activated Clotting Time 169 seconds  Glucose, capillary     Status: Abnormal   Collection Time: 04/20/19  3:43 PM  Result Value Ref Range   Glucose-Capillary 200 (H) 70 - 99 mg/dL  POCT Activated clotting time     Status: None   Collection Time: 04/20/19  3:45 PM  Result Value Ref Range   Activated Clotting Time 164 seconds  POCT Activated clotting time     Status: None   Collection Time: 04/20/19  5:44 PM  Result Value Ref Range   Activated Clotting Time 158 seconds  Glucose, capillary     Status: Abnormal   Collection Time: 04/20/19  8:26 PM  Result Value Ref Range   Glucose-Capillary 216 (H) 70 - 99 mg/dL  CBC     Status: Abnormal   Collection Time: 04/20/19  8:32 PM  Result Value Ref  Range   WBC 13.5 (H) 4.0 - 10.5 K/uL   RBC 2.30 (L) 4.22 - 5.81 MIL/uL   Hemoglobin 7.3 (L) 13.0 - 17.0 g/dL   HCT 21.6 (L) 39.0 - 52.0 %   MCV 93.9 80.0 - 100.0 fL   MCH 31.7 26.0 - 34.0 pg   MCHC 33.8 30.0 - 36.0 g/dL   RDW 16.4 (H) 11.5 - 15.5 %   Platelets 132 (L) 150 - 400 K/uL    Comment: REPEATED TO VERIFY   nRBC 0.4 (H) 0.0 - 0.2 %    Comment: Performed at [facility], [place] [address] [place], [place] [address]  Prepare RBC     Status: None   Collection Time: 04/20/19 10:02 PM  Result Value Ref Range   Order Confirmation      ORDER PROCESSED BY BLOOD BANK Performed at [facility], [place] [address] [place], [place] [address]   Glucose, capillary     Status: Abnormal   Collection Time: 04/21/19 12:24 AM  Result Value Ref Range   Glucose-Capillary 252 (H) 70 - 99 mg/dL   Basic metabolic panel     Status: Abnormal   Collection Time: 04/21/19  3:45 AM  Result Value Ref Range   Sodium 140 135 - 145 mmol/L   Potassium 3.9 3.5 - 5.1 mmol/L   Chloride 106 98 - 111 mmol/L   CO2 26 22 - 32 mmol/L   Glucose, Bld 273 (H) 70 - 99 mg/dL   BUN 54 (H) 8 - 23 mg/dL   Creatinine, Ser 2.00 (H) 0.61 - 1.24 mg/dL   Calcium 8.3 (L) 8.9 - 10.3 mg/dL   GFR calc non Af Amer 33 (L) >60 mL/min   GFR calc Af Amer 39 (L) >60 mL/min   Anion gap 8 5 - 15    Comment: Performed at [facility] [address] [place], [place] [address]  Lactate dehydrogenase     Status: Abnormal   Collection Time: 04/21/19  3:45 AM  Result Value Ref Range   LDH 462 (H) 98 - 192 U/L    Comment: Performed at [facility], [place] [address], [place], [place] [address]  CBC     Status: Abnormal   Collection Time: 04/21/19  3:45 AM  Result Value Ref Range   WBC 14.2 (H) 4.0 - 10.5 K/uL   RBC 2.62 (L) 4.22 - 5.81 MIL/uL   Hemoglobin 8.2 (L) 13.0 - 17.0 g/dL   HCT 24.9 (L) 39.0 - 52.0 %   MCV 95.0 80.0 - 100.0 fL   MCH 31.3 26.0 - 34.0 pg   MCHC 32.9 30.0 - 36.0 g/dL   RDW 16.1 (H) 11.5 - 15.5 %   Platelets 129 (L) 150 - 400 K/uL    Comment: REPEATED TO VERIFY   nRBC 0.6 (H) 0.0 - 0.2 %    Comment: Performed at [facility], [place] [address], [place], [place] [address]  Glucose, capillary     Status: Abnormal   Collection Time: 04/21/19  3:51 AM  Result Value Ref Range   Glucose-Capillary 254 (H) 70 - 99 mg/dL  .Cooxemetry Panel (carboxy, met, total hgb, O2 sat)     Status: Abnormal   Collection Time: 04/21/19  4:05 AM  Result Value Ref Range   Total hemoglobin 9.0 (L) 12.0 - 16.0 g/dL   O2 Saturation 53.0 %   Carboxyhemoglobin 1.1 0.5 - 1.5 %   Methemoglobin 0.8 0.0 -  1.5 %  Glucose, capillary     Status: Abnormal   Collection Time: 04/21/19  7:55 AM  Result Value Ref Range   Glucose-Capillary 210 (H) 70 - 99 mg/dL  .Cooxemetry Panel (carboxy, met, total hgb, O2 sat)      Status: Abnormal   Collection Time: 04/21/19  8:00 AM  Result Value Ref Range   Total hemoglobin 9.9 (L) 12.0 - 16.0 g/dL   O2 Saturation 73.5 %   Carboxyhemoglobin 1.4 0.5 - 1.5 %   Methemoglobin 1.2 0.0 - 1.5 %  Glucose, capillary     Status: Abnormal   Collection Time: 04/21/19 12:01 PM  Result Value Ref Range   Glucose-Capillary 194 (H) 70 - 99 mg/dL  Glucose, capillary     Status: Abnormal   Collection Time: 04/21/19  4:35 PM  Result Value Ref Range   Glucose-Capillary 189 (H) 70 - 99 mg/dL  Glucose, capillary     Status: Abnormal   Collection Time: 04/21/19  8:01 PM  Result Value Ref Range   Glucose-Capillary 197 (H) 70 - 99 mg/dL   Comment 1 Notify RN   Glucose, capillary     Status: Abnormal   Collection Time: 04/21/19 11:39 PM  Result Value Ref Range   Glucose-Capillary 217 (H) 70 - 99 mg/dL   Comment 1 Notify RN   Lactate dehydrogenase     Status: Abnormal   Collection Time: 04/22/19  2:48 AM  Result Value Ref Range   LDH 482 (H) 98 - 192 U/L    Comment: Performed at [facility], [place] [address] [place], [place] [address]  CBC     Status: Abnormal   Collection Time: 04/22/19  2:48 AM  Result Value Ref Range   WBC 21.0 (H) 4.0 - 10.5 K/uL   RBC 2.90 (L) 4.22 - 5.81 MIL/uL   Hemoglobin 9.1 (L) 13.0 - 17.0 g/dL   HCT 27.6 (L) 39.0 - 52.0 %   MCV 95.2 80.0 - 100.0 fL   MCH 31.4 26.0 - 34.0 pg   MCHC 33.0 30.0 - 36.0 g/dL   RDW 16.4 (H) 11.5 - 15.5 %   Platelets 180 150 - 400 K/uL   nRBC 0.5 (H) 0.0 - 0.2 %    Comment: Performed at [facility], [place] [address] [place], [place] [address]  Basic metabolic panel     Status: Abnormal   Collection Time: 04/22/19  2:48 AM  Result Value Ref Range   Sodium 141 135 - 145 mmol/L   Potassium 4.3 3.5 - 5.1 mmol/L   Chloride 102 98 - 111 mmol/L   CO2 27 22 - 32 mmol/L   Glucose, Bld 267 (H) 70 - 99 mg/dL   BUN 64 (H) 8 - 23 mg/dL   Creatinine, Ser 2.36 (H) 0.61 - 1.24 mg/dL   Calcium 8.7 (L) 8.9 - 10.3  mg/dL   GFR calc non Af Amer 27 (L) >60 mL/min   GFR calc Af Amer 32 (L) >60 mL/min   Anion gap 12 5 - 15    Comment: Performed at [facility] [address], [place], [place] [address]  Magnesium     Status: None   Collection Time: 04/22/19  2:48 AM  Result Value Ref Range   Magnesium 2.2 1.7 - 2.4 mg/dL    Comment: Performed at [facility] [address] [place],  [address]  Phosphorus     Status: None   Collection Time:  04/22/19  2:48 AM  Result Value Ref Range   Phosphorus 3.6 2.5 - 4.6 mg/dL    Comment: Performed at [facility] [address] [place], [place] [address]  Hepatic function panel     Status: Abnormal   Collection Time: 04/22/19  2:48 AM  Result Value Ref Range   Total Protein 5.9 (L) 6.5 - 8.1 g/dL   Albumin 2.3 (L) 3.5 - 5.0 g/dL   AST 45 (H) 15 - 41 U/L   ALT 48 (H) 0 - 44 U/L   Alkaline Phosphatase 70 38 - 126 U/L   Total Bilirubin 1.2 0.3 - 1.2 mg/dL   Bilirubin, Direct 0.4 (H) 0.0 - 0.2 mg/dL   Indirect Bilirubin 0.8 0.3 - 0.9 mg/dL    Comment: Performed at [facility] [address], [place], [place] [address]  Protime-INR     Status: None   Collection Time: 04/22/19  2:48 AM  Result Value Ref Range   Prothrombin Time 13.5 11.4 - 15.2 seconds   INR 1.0 0.8 - 1.2    Comment: (NOTE) INR goal varies based on device and disease states. Performed at [facility], [place] [address], [place], [place] [address]   .Cooxemetry Panel (carboxy, met, total hgb, O2 sat)     Status: Abnormal   Collection Time: 04/22/19  2:55 AM  Result Value Ref Range   Total hemoglobin 7.1 (L) 12.0 - 16.0 g/dL   O2 Saturation 90.0 %   Carboxyhemoglobin 1.3 0.5 - 1.5 %   Methemoglobin 0.8 0.0 - 1.5 %  I-STAT 7, (LYTES, BLD GAS, ICA, H+H)     Status: Abnormal   Collection Time: 04/22/19  3:47 AM  Result Value Ref Range   pH, Arterial 7.323 (L) 7.350 - 7.450   pCO2 arterial 52.6 (H) 32.0 - 48.0 mmHg   pO2, Arterial 59.0 (L) 83.0 -  108.0 mmHg   Bicarbonate 27.0 20.0 - 28.0 mmol/L   TCO2 28 22 - 32 mmol/L   O2 Saturation 85.0 %   Acid-Base Excess 1.0 0.0 - 2.0 mmol/L   Sodium 142 135 - 145 mmol/L   Potassium 4.1 3.5 - 5.1 mmol/L   Calcium, Ion 1.23 1.15 - 1.40 mmol/L   HCT 26.0 (L) 39.0 - 52.0 %   Hemoglobin 8.8 (L) 13.0 - 17.0 g/dL   Patient temperature 38.1 C    Collection site ARTERIAL LINE    Drawn by Nurse    Sample type ARTERIAL     Recent Results (from the past 240 hour(s))  SARS CORONAVIRUS 2 (TAT 6-12 HRS) Nasal Swab Aptima Multi Swab     Status: None   Collection Time: 04/16/19  1:29 PM   Specimen: Aptima Multi Swab; Nasal Swab  Result Value Ref Range Status   SARS Coronavirus 2 NEGATIVE NEGATIVE Final    Comment: (NOTE) SARS-CoV-2 target nucleic acids are NOT DETECTED. The SARS-CoV-2 RNA is generally detectable in upper and lower respiratory specimens during the acute phase of infection. Negative results do not preclude SARS-CoV-2 infection, do not rule out co-infections with other pathogens, and should not be used as the sole basis for treatment or other patient management decisions. Negative results must be combined with clinical observations, patient history, and epidemiological information. The expected result is Negative. Fact Sheet for Patients: SugarRoll.be Fact Sheet for Healthcare Providers: https://www.woods-mathews.com/ This test is not yet approved or cleared by the [place] FDA and  has been authorized for detection and/or diagnosis  of SARS-CoV-2 by FDA under an Emergency Use Authorization (EUA). This EUA will remain  in effect (meaning this test can be used) for the duration of the COVID-19 declaration under Section 56 4(b)(1) of the Act, 21 U.S.C. section 360bbb-3(b)(1), unless the authorization is terminated or revoked sooner. Performed at [facility], [place] [address], [place], [place] [address]   MRSA PCR Screening      Status: Abnormal   Collection Time: 04/16/19 10:15 PM   Specimen: Nasopharyngeal  Result Value Ref Range Status   MRSA by PCR POSITIVE (A) NEGATIVE Final    Comment:        The GeneXpert MRSA Assay (FDA approved for NASAL specimens only), is one component of a comprehensive MRSA colonization surveillance program. It is not intended to diagnose MRSA infection nor to guide or monitor treatment for MRSA infections. RESULT CALLED TO, READ BACK BY AND VERIFIED WITH: l short rn 04/16/19 2345 jdw Performed at [facility], [place] [address] [place], [place] [address]   Culture, blood (Routine X 2) w Reflex to ID Panel     Status: None   Collection Time: 04/16/19 11:18 PM   Specimen: BLOOD RIGHT HAND  Result Value Ref Range Status   Specimen Description BLOOD RIGHT HAND  Final   Special Requests   Final    BOTTLES DRAWN AEROBIC ONLY Blood Culture adequate volume   Culture   Final    NO GROWTH 5 DAYS Performed at [facility], [place] [address], [place], [place] [address]    Report Status 04/21/2019 FINAL  Final  Culture, blood (Routine X 2) w Reflex to ID Panel     Status: None   Collection Time: 04/16/19 11:25 PM   Specimen: BLOOD LEFT HAND  Result Value Ref Range Status   Specimen Description BLOOD LEFT HAND  Final   Special Requests   Final    BOTTLES DRAWN AEROBIC ONLY Blood Culture adequate volume   Culture   Final    NO GROWTH 5 DAYS Performed at [facility], [place] [address], [place], [place] [address]    Report Status 04/21/2019 FINAL  Final    Lipid Panel No results for input(s): CHOL, TRIG, HDL, CHOLHDL, VLDL, LDLCALC in the last 72 hours.  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 EXAM: CT HEAD WITHOUT CONTRAST TECHNIQUE: Contiguous axial images were obtained from the base of the skull through the  vertex without intravenous contrast. COMPARISON:  None. FINDINGS: Brain: There is no acute intracranial hemorrhage or intracranial mass effect. Ventricle size and configuration appears normal. Normal basilar cisterns. Gray-white matter differentiation in both hemispheres also appears within normal limits. No areas of cortically based infarct or parenchymal edema are evident. Vascular: Calcified atherosclerosis at the skull base. No suspicious intracranial vascular hyperdensity. Skull: Negative. Sinuses/Orbits: Intubated on the scalp view. Generalized paranasal sinus opacification. Fluid in the visible pharynx. Bilateral tympanic cavity and mastoid fluid. Other: Visualized orbits and scalp soft tissues are within normal limits. IMPRESSION: 1. No evidence of anoxic brain injury by CT, normal for age non-contrast CT appearance of the brain. 2. Intubated with generalized paranasal sinus and bilateral mastoid effusions. Electronically Signed   By: Genevie Ann M.D.   On: 04/21/2019 16:05    Medications:  Scheduled: . sodium chloride   Intravenous Once  . aspirin  81 mg Oral Daily  . atorvastatin  80 mg Per Tube q1800  . chlorhexidine gluconate (MEDLINE KIT)  15 mL Mouth Rinse BID  . Chlorhexidine Gluconate Cloth  6 each Topical Daily  . enoxaparin (LOVENOX) injection  40 mg Subcutaneous Q24H  . famotidine  20 mg Per Tube Daily  . feeding supplement (PRO-STAT SUGAR FREE 64)  30 mL Per Tube BID  . fentaNYL (SUBLIMAZE) injection  25 mcg Intravenous Once  . furosemide  80 mg Intravenous BID  . insulin aspart  0-15 Units Subcutaneous Q4H  . insulin detemir  5 Units Subcutaneous BID  . isosorbide mononitrate  30 mg Oral Daily  . mouth rinse  15 mL Mouth Rinse 10 times per day  . potassium chloride  40 mEq Oral Daily  . sodium chloride flush  10 mL Intravenous Q12H   Continuous: . amiodarone 30 mg/hr (04/22/19 0700)  . ceFEPime (MAXIPIME) IV Stopped (04/21/19 2250)  . feeding supplement (VITAL AF 1.2 CAL)  1,000 mL (04/20/19 1301)  . fentaNYL infusion INTRAVENOUS 50 mcg/hr (04/22/19 0700)  . levETIRAcetam 500 mg (04/21/19 2313)  . midazolam 2 mg/hr (04/22/19 0700)  . milrinone 0.25 mcg/kg/min (04/22/19 0700)  . norepinephrine (LEVOPHED) Adult infusion 2 mcg/min (04/22/19 0700)  . 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  1. Examreveals intact brain stemreflexesand minimalcortical function. 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 3. CT head: . No evidence of anoxic brain injury by CT, normal for age non-contrast CT appearance of the brain.  Recommendations: 1.No strong indication for anticonvulsant based on negative EEG, but given high risk of development of seizures in anoxic brain injury, Kepprawas started at500 mg BID(He has decreased renal function and if seizures occur on Keppra at 500 mg BID, would add Vimpat rather than increasing Keppra). Transaminases are elevated, therefore VPA or Dilantin are not optimal choices. 2. MRI brain to assess for anoxic brain injury.  30 minutes spent in the neurological evaluation and management of this critically ill patient. Time spent included image review.    LOS: 6 days   _0  signed: Dr. Kerney Elbe 04/22/2019  7:28 AM

## 2019-04-22 NOTE — Progress Notes (Signed)
[facility] Progress Note Patient Name: Taylan Marez DOB: [birth year] [name] [id number]   Date of Service  04/22/2019  HPI/Events of Note  ABG on 80%/PRVC 21/TV 510/P 8 = 7.323/52.6/59.0. Sat on oximeter = 97%  eICU Interventions  Will order: 1. Increase PEEP to 10.      Intervention Category Major Interventions: Respiratory failure - evaluation and management  [name],[name] Eugene 04/22/2019, 4:06 AM

## 2019-04-22 NOTE — Progress Notes (Signed)
[facility] Progress Note Patient Name: Lawrence Olson DOB: [birth year] [name] [id number]   Date of Service  04/22/2019  HPI/Events of Note  Multiple issues: 1. Hypoxia - CXR with increasing bilateral opacities. Sat = 90%, 2. Request for continuous sedation.   eICU Interventions  Will order: 1. Increase PEEP to 12. 2. Xopenex 0.63 mg via neb Q 3 hours PRN. 3. Precedex IV infusion. Titrate to RASS = 0 to -1.      Intervention Category Major Interventions: Hypoxemia - evaluation and management;Other:  [name],[name] Cornelia Copa 04/22/2019, 9:18 PM

## 2019-04-22 NOTE — Progress Notes (Signed)
[name], [name]  [id number], DOB:  03/28/1951, LOS: 0 ADMISSION DATE:  04/16/2019, CONSULTATION DATE:  04/16/2019 REFERRING MD:  Claiborne Billings CHIEF COMPLAINT:  STEMI, resp failure  Brief History   68yo M out of hospital cardiac arrest with ROSC, intubated, found to have STEMI, taken emergently to cath lab. Proximal LAD lesion. PCCM consulted for management assistance.   Past Medical History   has no past medical history on file.   has a past surgical history that includes RIGHT HEART CATH AND CORONARY ANGIOGRAPHY (N/A, 04/16/2019) and VENTRICULAR ASSIST DEVICE INSERTION (N/A, 04/16/2019).   Significant Hospital Events   8/24: Cath Lab. PTCI unsuccessful despite multiple attempts.  RA 13 RV 42/16 PA 40/26 (31) PCWP 18 Pa sat 50%  Fick 2.7/1.3 CPO 0.6 Impella placed. VT /p same requiring defib.   8/27: Remains comatose with decorticate posturing.  Still on Impella support.  Weaning vasoactive drips.  At this point major question is neurological recovery.  Would need further cardiac intervention if he were to recover neurologically.  Neurology has been consulted. EEG - negative  8/28 - impellla removed.    8/29 -  s/p impella removal yesterday. Off epi gttt, off levophed gtt. Remains on milrinone. Continues on amio gtt.,  T max 100.6 yesrerday - on Vanc and cefepime. Daughter says he moves extremities in responds to [place] language.  Cards wants to continue milrinone, and give lasix toda. Also on amio gtt. Ok with dc of [place]. On fent gtt and versed gtt.  Needing 90% fio2 and 5 peep Consults:  Cardiology PCCM Heart failure   Procedures:  8/24: PTCI unsuccessful 8/14: Impella L groin>> 8/24: 7.5 ETT>> 8/24: Right femoral venous sheath with PA cath>> 8/24: Right femoral arterial sheath>> 8/24: Left femoral arterial sheath>> 8/24: L IJ CVC >>>  Significant Diagnostic Tests:  Chest x-ray 8/27: Bilateral interstitial edema Echocardiogram 8/27: Well-positioned  Impella cannula.  Continued severely depressed LV function with anteroapical wall motion abnormality.  Micro Data:  [id number]: Negative 8/24 blood cultures x2>>> negative Urine culture 8/27:>>> Negative MRSA swab: Positive  Antimicrobials:  Vancomycin and cefepime from 8/25 through 826, again 8/28 >>  Interim history/subjective:   8/30 - on amio gtt, milrinone gtt. Had fever, despite vanc and cefepime. CT head no anoxia but has sinus fluid.  On keppra and vimpat. On 80% fio2/peep 8. AI worse - creat 2.36. Was on lasix.   Just off sedation gtt -> moving his head towards voice and opeening to voice and trying to focus  Objective    Today's Vitals   04/22/19 0800 04/22/19 0812 04/22/19 0830 04/22/19 0900  BP: (!) 107/57  106/61 113/68  Pulse: 74 74 76 79  Resp: (!) 21 20 (!) 21 (!) 21  Temp: 99.5 F (37.5 C) 99.3 F (37.4 C) 99.3 F (37.4 C) 99.3 F (37.4 C)  TempSrc:      SpO2: 100% 100% 100% 100%  Weight:      Height:      PainSc:       Vent Mode: PRVC FiO2 (%):  [60 %-90 %] 80 % Set Rate:  [21 bmp] 21 bmp Vt Set:  [510 mL] 510 mL PEEP:  [8 cmH20] 8 cmH20 Plateau Pressure:  [19 cmH20-24 cmH20] 24 cmH20  CVP:  [8 mmHg-15 mmHg] 15 mmHg   Examination: General Appearance:  Looks criticall ill  Head:  Normocephalic, without obvious abnormality, atraumatic Eyes:  PERRL - yes, conjunctiva/corneas - muddy     Ears:  Normal external ear canals, both ears Nose:  G tube - no Throat:  ETT TUBE - yes , OG tube - yes Neck:  Supple,  No enlargement/tenderness/nodules Lungs: Clear to auscultation bilaterally, Ventilator   Synchrony - yes Heart:  S1 and S2 normal, no murmur, CVP - x.  Pressors - no Abdomen:  Soft, no masses, no organomegaly Genitalia / Rectal:  Not done Extremities:  Extremities- intact with prevalon boots Skin:  ntact in exposed areas . Sacral area -  Not examined Neurologic:  Sedation - just off x 10 min -> RASS - -3 . 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  Patient was taken to the cath lab and found to have 100% proximal LAD lesion. PCI unsuccessful so impella placed.    Acute systolic heart failure and cardiogenic shock status post cardiac arrest/ VF- r/t anterior MI, CAD with acute STEMI - 100% prox LAD - PCI unsuccessful    8/30 - off levophed. On amio gtt, imdur, milrinone, baby asa 81 hydralazine prn   Plan Per Cardiogy   Acute hypoxic respiratory failure - r/t MI, cardiac arrest, cardiogenic shock  04/22/2019 - does not meet SBT criteira due to Fio2 90%  Plan Full ventilator support - PRVC    AKI - s/p cardiac arrest   04/22/2019 -rising to 2.36mg %  Plan Hold diuresis Try fluid bolus given fever spike  DC vanc 8.31/2020 if creat rising   Anemia of critical illness, continues to have intermittent hematuria while on aggrastat heparin gtt for impella)  04/22/2019 - no  Hematuria now off aggrastate and heparin gtt. On baby aspirin and SQ lovenox   Plan Hgb goal > 8gm% in view of MI, cardioogenic shock or >= 7.5 Continue sq lovenox Continue baby aspirin   Mildly elevated LFTs with icteric sclera trending down  04/22/2019 - impropving   Plan Continue to trend LFTs for now will defer ultrasound imaging at this point given poor neurological exam  Acute encephalopathy - post cardiac arrest. (earlier ccm note: On further questioning it appears as though time to review of spontaneous circulation may have been longer than initially felt.  He opens his eyes however this seems to be spontaneously, and is exhibiting decorticate posturing raising concern for anoxic injury certainly nonpurposeful  04/22/2019 -seems better. CT head without anoxia    Plan Full sedation vacation  Prn sedation  Sepsis  8/30 - fever despit antiibiotics. CT with sinus fluid collection  Plan cotninue vanc for now (dc 8/31 if creat rises) Change cefepine to zosn    Hyperglycemia Plan Add sliding scale insulin   Best practice  Sedation: PAD protocol Nutrition: tubefeeds SUP: H2B DVT prophylaxis: IV heparin Glycemic control: SSI   FamilyL: daughter updated at bedside     ATTESTATION & SIGNATURE   The patient Lawrence Olson is critically ill with multiple organ systems failure and requires high complexity decision making for assessment and support, frequent evaluation and titration of therapies, application of advanced monitoring technologies and extensive interpretation of multiple databases.   Critical Care Time devoted to patient care services described in this note is  30  Minutes. This time reflects time of  care of this signee Dr Brand Males. This critical care time does not reflect procedure time, or teaching time or supervisory time of PA/NP/Med student/Med Resident etc but could involve care discussion time     Dr. Brand Males, M.D., [facility].C.P Pulmonary and Critical Care Medicine Staff Physician [facility] Pager: [phone], If no answer or between  15:00h - 7:00h: call [phone]  04/22/2019 10:40 AM     LABS    PULMONARY Recent Labs  Lab 04/16/19 1547  04/16/19 1701 04/16/19 1850  04/16/19 2102  04/17/19 0620 04/17/19 1306  04/21/19 0405 04/21/19 0800 04/22/19 0255 04/22/19 0347 04/22/19 0855  PHART 7.225*  --  7.256* 7.283*  --  7.305*  --   --   --   --   --   --   --  7.323*  --   PCO2ART 51.1*  --  47.6 51.6*  --  47.7  --   --   --   --   --   --   --  52.6*  --   PO2ART 192.0*  --  73.0* 391.0*  --  279.0*  --   --   --   --   --   --   --  59.0*  --   HCO3 21.2   < > 21.2 24.5  --  23.8  --  20.7  --   --   --   --   --  27.0  --   TCO2 23   <  > 23 26  --  25  --  22 21*  --   --   --   --  28  --   O2SAT 99.0   < > 92.0 100.0   < > 100.0   < > 96.0  --    < > 53.0 73.5 90.0 85.0 83.4   < > = values in this interval not displayed.    CBC Recent Labs  Lab 04/20/19 2032 04/21/19 0345 04/22/19 0248 04/22/19 0347  HGB 7.3* 8.2* 9.1* 8.8*  HCT 21.6* 24.9* 27.6* 26.0*  WBC 13.5* 14.2* 21.0*  --   PLT 132* 129* 180  --     COAGULATION Recent Labs  Lab 04/16/19 1335 04/16/19 1921 04/22/19 0248  INR 1.3* 1.7* 1.0    CARDIAC  No results for input(s): TROPONINI in the last 168 hours. No results for input(s): PROBNP in the last 168 hours.   CHEMISTRY Recent Labs  Lab 04/17/19 0547  04/17/19 1254  04/18/19 0253 04/19/19 0359 04/20/19 0452 04/21/19 0345 04/22/19 0248 04/22/19 0347  NA 140   < > 142   < > 139 140 138 140 141 142  K 4.0   < > 3.9   < > 3.5 3.8 3.7 3.9 4.3 4.1  CL 110  --  112*   < > 112* 111 107 106 102  --   CO2 19*  --  21*  --  21* 23 25 26 27   --   GLUCOSE 251*  --  143*   < > 179* 217* 267* 273* 267*  --   BUN 27*  --  32*   < > 38* 49* 51* 54* 64*  --   CREATININE 1.80*  --  2.06*   < > 2.06* 2.09* 1.82* 2.00* 2.36*  --   CALCIUM 7.3*  --  7.5*  --  7.5* 7.7* 8.0* 8.3*  8.7*  --   MG 2.0  --  1.9  --   --  1.8 2.2  --  2.2  --   PHOS 1.3*  --   --   --   --   --   --   --  3.6  --    < > = values in this interval not displayed.   Estimated Creatinine Clearance: 32 mL/min (A) (by C-G formula based on SCr of 2.36 mg/dL (H)).   LIVER Recent Labs  Lab 04/16/19 1335 04/16/19 1921 04/18/19 0946 04/22/19 0248  AST 166*  --  102* 45*  ALT 211*  --  85* 48*  ALKPHOS 83  --  39 70  BILITOT 0.8  --  1.4* 1.2  PROT 6.3*  --  4.6* 5.9*  ALBUMIN 3.5  --  2.3* 2.3*  INR 1.3* 1.7*  --  1.0     INFECTIOUS Recent Labs  Lab 04/17/19 0103 04/17/19 0547 04/18/19 1006  LATICACIDVEN 6.8* 4.5* 0.9     ENDOCRINE CBG (last 3)  Recent Labs    04/21/19 2001 04/21/19 2339 04/22/19 0746   GLUCAP 197* 217* 223*         IMAGING x48h  - image(s) personally visualized  -   highlighted in bold Ct Head Wo Contrast  Result Date: 04/21/2019 CLINICAL DATA:  68 year old male with suspected anoxic brain injury after VFib cardiac arrest and a downtime of greater than 20 minutes. EXAM: CT HEAD WITHOUT CONTRAST TECHNIQUE: Contiguous axial images were obtained from the base of the skull through the vertex without intravenous contrast. COMPARISON:  None. FINDINGS: Brain: There is no acute intracranial hemorrhage or intracranial mass effect. Ventricle size and configuration appears normal. Normal basilar cisterns. Gray-white matter differentiation in both hemispheres also appears within normal limits. No areas of cortically based infarct or parenchymal edema are evident. Vascular: Calcified atherosclerosis at the skull base. No suspicious intracranial vascular hyperdensity. Skull: Negative. Sinuses/Orbits: Intubated on the scalp view. Generalized paranasal sinus opacification. Fluid in the visible pharynx. Bilateral tympanic cavity and mastoid fluid. Other: Visualized orbits and scalp soft tissues are within normal limits. IMPRESSION: 1. No evidence of anoxic brain injury by CT, normal for age non-contrast CT appearance of the brain. 2. Intubated with generalized paranasal sinus and bilateral mastoid effusions. Electronically Signed   By: Genevie Ann M.D.   On: 04/21/2019 16:05   Dg Chest Port 1 View  Result Date: 04/22/2019 CLINICAL DATA:  Cardiac arrest. EXAM: PORTABLE CHEST 1 VIEW COMPARISON:  Chest x-ray dated April 20, 2019. FINDINGS: Interval removal of the Impella device and femoral approach pulmonary artery catheter. Unchanged endotracheal and enteric tubes. Unchanged left internal jugular central venous catheter. Stable cardiomegaly. Bilateral mid to lower lung predominant interstitial and hazy airspace opacities have mildly increased. New small left pleural effusion. No pneumothorax. No acute  osseous abnormality. IMPRESSION: 1. Mildly worsened pulmonary edema. New small left pleural effusion. Electronically Signed   By: Titus Dubin M.D.   On: 04/22/2019 09:22

## 2019-04-22 NOTE — Progress Notes (Addendum)
Pt desaturating in 70s-80's. RT at bedside. Suctioned, lavaged and removed mucous plug but stats remained in high 80s following interventions. E-link was called to notify CCM. Ramaswamy MD at bedside. Ordered chest-xray stat.  Jisela Merlino RN 04/22/2019 1900h

## 2019-04-22 NOTE — Progress Notes (Signed)
[facility] Progress Note Patient Name: Lawrence Olson DOB: 14-Apr-1951 [name] [id number]   Date of Service  04/22/2019  HPI/Events of Note  Hypotension - BP = 83/45 with MAP = 56.   eICU Interventions  Will order: 1. Monitor CVP now and Q 4 hours.  2. Phenylephrine IV infusion. Titrate to MAP >= 65.     Intervention Category Major Interventions: Hypotension - evaluation and management  [name],[name] Eugene 04/22/2019, 11:01 PM

## 2019-04-22 NOTE — Progress Notes (Signed)
Pharmacy Antibiotic Note  Lawrence Olson is a 68 y.o. male admitted on 04/16/2019 with cardiogenic shock s/p arrest. Pt on ABX initially then D/Cd on 8/26. Restarted 8/28. Adjust abx today based on renal function and possible sinusitis.   Tmax 99, wbc up to 21 this morning. Scr trending up at 2.3.   Plan: Zosyn 3.375g q8 hours Vancomycin 750mg  IV q24h - est AUC 435 Vancomycin levels as needed Follow renal funx, LOT  Height: 5\' 6"  (167.6 cm) Weight: 205 lb 11 oz (93.3 kg) IBW/kg (Calculated) : 63.8  Temp (24hrs), Avg:100.3 F (37.9 C), Min:98.4 F (36.9 C), Max:102.2 F (39 C)  Recent Labs  Lab 04/16/19 2200 04/17/19 0103 04/17/19 0547  04/18/19 0253 04/18/19 1006 04/19/19 0359  04/19/19 1717 04/20/19 0452 04/20/19 2032 04/21/19 0345 04/22/19 0248  WBC  --   --  25.1*  --  27.3*  --   --    < > 17.0* 16.0* 13.5* 14.2* 21.0*  CREATININE  --   --  1.80*   < > 2.06*  --  2.09*  --   --  1.82*  --  2.00* 2.36*  LATICACIDVEN 6.4* 6.8* 4.5*  --   --  0.9  --   --   --   --   --   --   --    < > = values in this interval not displayed.    Estimated Creatinine Clearance: 32 mL/min (A) (by C-G formula based on SCr of 2.36 mg/dL (H)).    No Known Allergies  Antimicrobials this admission: Vancomycin 8/24 >> 8/25; 8/28 >> Cefepime 8/24 >> 8/26; 8/28 >>8/30 Zosyn 8/30>>  Microbiology results: 8/29 BCx: ngtd 8/29 Resp Cx: ngtd 8/24 BCx: ngF 8/24 COVID: neg 8/24 MRSA: positive   Thank you for allowing pharmacy to be a part of this patient's care.  Erin Hearing PharmD., BCPS Clinical Pharmacist 04/22/2019 11:26 AM

## 2019-04-22 NOTE — Progress Notes (Signed)
Patient ID: Lawrence Olson, male   DOB: 08-16-1951, 68 y.o.   [name] [id number]   Advanced Heart Failure Team  Note   Primary Physician: Patient, No Pcp Per PCP-Cardiologist:  No primary care provider on file.  Reason for Consultation: Cardiogenic shock  HPI:    Not awake or moving for me, per nurse he will grimace with mouth care.  Per neurology note, intact brainstem function but minimal cortical function detectable.  However, some report that he responds to family speaking [place].   Impella removed 8/28. Luiz Blare out.   He is off epinephrine, remains on milrinone 0.25 mcg. Norepinephrine was restarted last night with fever, now down to 2.  He continues on amiodarone gtt.    Tm 102.2 last night, now afebrile.  Recultured, previous cultures remain negative. He is on vancomycin/cefepime.   Objective:    Vital Signs:   Temp:  [98.4 F (36.9 C)-102.2 F (39 C)] 99.7 F (37.6 C) (08/30 0700) Pulse Rate:  [71-108] 74 (08/30 0700) Resp:  [18-24] 21 (08/30 0700) BP: (91-148)/(45-73) 102/57 (08/30 0700) SpO2:  [90 %-100 %] 100 % (08/30 0700) Arterial Line BP: (90-198)/(44-94) 109/49 (08/30 0700) FiO2 (%):  [60 %-90 %] 80 % (08/30 0400) Weight:  [93.3 kg] 93.3 kg (08/30 0330) Last BM Date: (PTA)  Weight change: Filed Weights   04/20/19 0500 04/21/19 0455 04/22/19 0330  Weight: 96.4 kg 97.1 kg 93.3 kg    Intake/Output:   Intake/Output Summary (Last 24 hours) at 04/22/2019 0736 Last data filed at 04/22/2019 0700 Gross per 24 hour  Intake 3644.15 ml  Output 2940 ml  Net 704.15 ml      Physical Exam    General: Intubated.  Neck: Thick, JVP difficult, no thyromegaly or thyroid nodule.  Lungs: Decreased at bases.  CV: Nondisplaced PMI.  Heart regular S1/S2, no S3/S4, no murmur.  1+ edema 1/2 to knees.   Abdomen: Soft, nontender, no hepatosplenomegaly, no distention.  Skin: Intact without lesions or rashes.  Neurologic: No purposeful movement for me.  Extremities: No clubbing or  cyanosis.  HEENT: Normal.    Telemetry   NSR 70s with occasional PVCs Personally reviewed  Labs   Basic Metabolic Panel: Recent Labs  Lab 04/17/19 0547  04/17/19 1254  04/18/19 0253 04/19/19 0359 04/20/19 0452 04/21/19 0345 04/22/19 0248 04/22/19 0347  NA 140   < > 142   < > 139 140 138 140 141 142  K 4.0   < > 3.9   < > 3.5 3.8 3.7 3.9 4.3 4.1  CL 110  --  112*   < > 112* 111 107 106 102  --   CO2 19*  --  21*  --  21* '23 25 26 27  ' --   GLUCOSE 251*  --  143*   < > 179* 217* 267* 273* 267*  --   BUN 27*  --  32*   < > 38* 49* 51* 54* 64*  --   CREATININE 1.80*  --  2.06*   < > 2.06* 2.09* 1.82* 2.00* 2.36*  --   CALCIUM 7.3*  --  7.5*  --  7.5* 7.7* 8.0* 8.3* 8.7*  --   MG 2.0  --  1.9  --   --  1.8 2.2  --  2.2  --   PHOS 1.3*  --   --   --   --   --   --   --  3.6  --    < > =  values in this interval not displayed.    Liver Function Tests: Recent Labs  Lab 04/16/19 1335 04/18/19 0946 04/22/19 0248  AST 166* 102* 45*  ALT 211* 85* 48*  ALKPHOS 83 39 70  BILITOT 0.8 1.4* 1.2  PROT 6.3* 4.6* 5.9*  ALBUMIN 3.5 2.3* 2.3*   No results for input(s): LIPASE, AMYLASE in the last 168 hours. No results for input(s): AMMONIA in the last 168 hours.  CBC: Recent Labs  Lab 04/16/19 1335  04/19/19 1717 04/20/19 0452 04/20/19 2032 04/21/19 0345 04/22/19 0248 04/22/19 0347  WBC 27.5*   < > 17.0* 16.0* 13.5* 14.2* 21.0*  --   NEUTROABS 19.3*  --   --   --   --   --   --   --   HGB 13.8   < > 8.0* 7.4* 7.3* 8.2* 9.1* 8.8*  HCT 42.9   < > 23.6* 22.6* 21.6* 24.9* 27.6* 26.0*  MCV 100.2*   < > 92.9 95.4 93.9 95.0 95.2  --   PLT 331   < > 138* 124* 132* 129* 180  --    < > = values in this interval not displayed.    Cardiac Enzymes: No results for input(s): CKTOTAL, CKMB, CKMBINDEX, TROPONINI in the last 168 hours.  BNP: BNP (last 3 results) No results for input(s): BNP in the last 8760 hours.  ProBNP (last 3 results) No results for input(s): PROBNP in the last  8760 hours.   CBG: Recent Labs  Lab 04/21/19 0755 04/21/19 1201 04/21/19 1635 04/21/19 2001 04/21/19 2339  GLUCAP 210* 194* 189* 197* 217*    Coagulation Studies: Recent Labs    04/22/19 0248  LABPROT 13.5  INR 1.0     Imaging   Ct Head Wo Contrast  Result Date: 04/21/2019 CLINICAL DATA:  68 year old male with suspected anoxic brain injury after VFib cardiac arrest and a downtime of greater than 20 minutes. EXAM: CT HEAD WITHOUT CONTRAST TECHNIQUE: Contiguous axial images were obtained from the base of the skull through the vertex without intravenous contrast. COMPARISON:  None. FINDINGS: Brain: There is no acute intracranial hemorrhage or intracranial mass effect. Ventricle size and configuration appears normal. Normal basilar cisterns. Gray-white matter differentiation in both hemispheres also appears within normal limits. No areas of cortically based infarct or parenchymal edema are evident. Vascular: Calcified atherosclerosis at the skull base. No suspicious intracranial vascular hyperdensity. Skull: Negative. Sinuses/Orbits: Intubated on the scalp view. Generalized paranasal sinus opacification. Fluid in the visible pharynx. Bilateral tympanic cavity and mastoid fluid. Other: Visualized orbits and scalp soft tissues are within normal limits. IMPRESSION: 1. No evidence of anoxic brain injury by CT, normal for age non-contrast CT appearance of the brain. 2. Intubated with generalized paranasal sinus and bilateral mastoid effusions. Electronically Signed   By: Genevie Ann M.D.   On: 04/21/2019 16:05     Medications:     Current Medications: . sodium chloride   Intravenous Once  . aspirin  81 mg Oral Daily  . atorvastatin  80 mg Per Tube q1800  . chlorhexidine gluconate (MEDLINE KIT)  15 mL Mouth Rinse BID  . Chlorhexidine Gluconate Cloth  6 each Topical Daily  . enoxaparin (LOVENOX) injection  40 mg Subcutaneous Q24H  . famotidine  20 mg Per Tube Daily  . feeding supplement  (PRO-STAT SUGAR FREE 64)  30 mL Per Tube BID  . fentaNYL (SUBLIMAZE) injection  25 mcg Intravenous Once  . furosemide  80 mg Intravenous Once  .  insulin aspart  0-15 Units Subcutaneous Q4H  . insulin detemir  5 Units Subcutaneous BID  . isosorbide mononitrate  30 mg Oral Daily  . mouth rinse  15 mL Mouth Rinse 10 times per day  . potassium chloride  40 mEq Oral Daily  . sodium chloride flush  10 mL Intravenous Q12H    Infusions: . amiodarone 30 mg/hr (04/22/19 0700)  . ceFEPime (MAXIPIME) IV Stopped (04/21/19 2250)  . feeding supplement (VITAL AF 1.2 CAL) 1,000 mL (04/20/19 1301)  . fentaNYL infusion INTRAVENOUS 50 mcg/hr (04/22/19 0700)  . levETIRAcetam 500 mg (04/21/19 2313)  . midazolam 2 mg/hr (04/22/19 0700)  . milrinone 0.25 mcg/kg/min (04/22/19 0700)  . norepinephrine (LEVOPHED) Adult infusion 2 mcg/min (04/22/19 0700)  . vancomycin Stopped (04/21/19 1925)    Assessment/Plan   1. CAD: Anterolateral STEMI, cath 8/24 showed occluded proximal LAD, unable to open.  Once we see how he recovers neurologically, will need to decide on feasibility of re-attempt PCI versus CABG.  - Continue ASA + statin.  2. Cardiogenic shock: Ischemic cardiomyopathy.  Echo with EF 25%, normal RV.  Impella removed 8/28.  Now remains on milrinone 0.25 and NE 2 (restarted with fever last night).  Co-ox not accurate this morning (90%).  CVP 12. Creatinine 1.8 => 2 => 2.36.  - Continue milrinone 0.25, re-send co-ox.  - Good BP, think he can come off norepinephrine.  - With rise in creatinine and lower CVP, will give Lasix 80 mg IV only x 1 today.   3. AKI: Suspect hemodynamic, creatinine worse this morning after hypotension last night with fever.  SBP now back up to 120s and afebrile. Creatinine 1.8 => 2 => 2.36.  4. Cardiac arrest/VF: 20 minutes downtime, concerned for anoxic brain injury.  Neurology following, per yesterday's note he seems to have intact brainstem function but minimal cortical function.  EEG suggestive of diffuse encephalopathy. CT head without definite evidence for anoxic encephalopathy.  No purposeful movement for me.  - Continue amiodarone gtt.  - Continue neuro assessment.  5. ID: Febrile last night to 102.2, afebrile this morning.  WBCs up to 21.  Recultured last night.  - Continue vancomycin/Cefepime, pending culture data (negative so far).  6. Anemia: Hgb down to 6.5-> 2u RBC -> 7.4 -> 8.2 => 9.1.  - Transfuse as needed. Keep hgb >= 7.5 7. Acute hypoxic respiratory failure - CCM following.  8. Elevated LFTs: Suspect shock liver, improving.  9. DVT prophylaxis: Lovenox.   CRITICAL CARE Performed by: Loralie Champagne  Total critical care time: 40 minutes  Critical care time was exclusive of separately billable procedures and treating other patients.  Critical care was necessary to treat or prevent imminent or life-threatening deterioration.  Critical care was time spent personally by me (independent of midlevel providers or residents) on the following activities: development of treatment plan with patient and/or surrogate as well as nursing, discussions with consultants, evaluation of patient's response to treatment, examination of patient, obtaining history from patient or surrogate, ordering and performing treatments and interventions, ordering and review of laboratory studies, ordering and review of radiographic studies, pulse oximetry and re-evaluation of patient's condition.   Loralie Champagne 04/22/2019 7:36 AM

## 2019-04-23 ENCOUNTER — Inpatient Hospital Stay (HOSPITAL_COMMUNITY): Payer: PPO

## 2019-04-23 LAB — BODY FLUID CELL COUNT WITH DIFFERENTIAL
Eos, Fluid: 0 %
Lymphs, Fluid: 5 %
Monocyte-Macrophage-Serous Fluid: 15 % — ABNORMAL LOW (ref 50–90)
Neutrophil Count, Fluid: 80 % — ABNORMAL HIGH (ref 0–25)
Total Nucleated Cell Count, Fluid: 170 cu mm (ref 0–1000)

## 2019-04-23 LAB — COOXEMETRY PANEL
Carboxyhemoglobin: 0.9 % (ref 0.5–1.5)
Methemoglobin: 0.8 % (ref 0.0–1.5)
O2 Saturation: 60.7 %
Total hemoglobin: 9.7 g/dL — ABNORMAL LOW (ref 12.0–16.0)

## 2019-04-23 LAB — CBC
HCT: 28.2 % — ABNORMAL LOW (ref 39.0–52.0)
Hemoglobin: 9 g/dL — ABNORMAL LOW (ref 13.0–17.0)
MCH: 30.8 pg (ref 26.0–34.0)
MCHC: 31.9 g/dL (ref 30.0–36.0)
MCV: 96.6 fL (ref 80.0–100.0)
Platelets: 257 10*3/uL (ref 150–400)
RBC: 2.92 MIL/uL — ABNORMAL LOW (ref 4.22–5.81)
RDW: 15.6 % — ABNORMAL HIGH (ref 11.5–15.5)
WBC: 20.7 10*3/uL — ABNORMAL HIGH (ref 4.0–10.5)
nRBC: 0.5 % — ABNORMAL HIGH (ref 0.0–0.2)

## 2019-04-23 LAB — BASIC METABOLIC PANEL
Anion gap: 12 (ref 5–15)
BUN: 76 mg/dL — ABNORMAL HIGH (ref 8–23)
CO2: 27 mmol/L (ref 22–32)
Calcium: 8.5 mg/dL — ABNORMAL LOW (ref 8.9–10.3)
Chloride: 103 mmol/L (ref 98–111)
Creatinine, Ser: 2.65 mg/dL — ABNORMAL HIGH (ref 0.61–1.24)
GFR calc Af Amer: 27 mL/min — ABNORMAL LOW (ref >60)
GFR calc non Af Amer: 24 mL/min — ABNORMAL LOW (ref >60)
Glucose, Bld: 297 mg/dL — ABNORMAL HIGH (ref 70–99)
Potassium: 4 mmol/L (ref 3.5–5.1)
Sodium: 142 mmol/L (ref 135–145)

## 2019-04-23 LAB — LACTATE DEHYDROGENASE: LDH: 1043 U/L — ABNORMAL HIGH (ref 98–192)

## 2019-04-23 LAB — GLUCOSE, CAPILLARY
Glucose-Capillary: 274 mg/dL — ABNORMAL HIGH (ref 70–99)
Glucose-Capillary: 276 mg/dL — ABNORMAL HIGH (ref 70–99)
Glucose-Capillary: 282 mg/dL — ABNORMAL HIGH (ref 70–99)
Glucose-Capillary: 318 mg/dL — ABNORMAL HIGH (ref 70–99)
Glucose-Capillary: 323 mg/dL — ABNORMAL HIGH (ref 70–99)
Glucose-Capillary: 326 mg/dL — ABNORMAL HIGH (ref 70–99)

## 2019-04-23 LAB — MAGNESIUM: Magnesium: 2.4 mg/dL (ref 1.7–2.4)

## 2019-04-23 LAB — PHOSPHORUS: Phosphorus: 3.4 mg/dL (ref 2.5–4.6)

## 2019-04-23 LAB — PROCALCITONIN: Procalcitonin: 1.08 ng/mL

## 2019-04-23 LAB — AMMONIA: Ammonia: 40 umol/L — ABNORMAL HIGH (ref 9–35)

## 2019-04-23 MED ORDER — FUROSEMIDE 10 MG/ML IJ SOLN
80.0000 mg | Freq: Once | INTRAMUSCULAR | Status: AC
  Administered 2019-04-23: 80 mg via INTRAVENOUS
  Filled 2019-04-23: qty 8

## 2019-04-23 MED ORDER — VITAL AF 1.2 CAL PO LIQD
1000.0000 mL | ORAL | Status: DC
  Administered 2019-04-23 – 2019-05-01 (×8): 1000 mL

## 2019-04-23 MED ORDER — NOREPINEPHRINE 4 MG/250ML-% IV SOLN: 0.0000 ug/min | INTRAVENOUS | Status: DC

## 2019-04-23 MED ORDER — ETOMIDATE 2 MG/ML IV SOLN
INTRAVENOUS | Status: AC
  Filled 2019-04-23: qty 10

## 2019-04-23 MED ORDER — DEXMEDETOMIDINE HCL IN NACL 400 MCG/100ML IV SOLN
0.4000 ug/kg/h | INTRAVENOUS | Status: DC
  Administered 2019-04-23: 21:00:00 0.8 ug/kg/h via INTRAVENOUS
  Administered 2019-04-23 – 2019-04-24 (×4): 1.2 ug/kg/h via INTRAVENOUS
  Administered 2019-04-24: 0.8 ug/kg/h via INTRAVENOUS
  Administered 2019-04-24 – 2019-04-25 (×4): 1.2 ug/kg/h via INTRAVENOUS
  Administered 2019-04-25: 0.5 ug/kg/h via INTRAVENOUS
  Administered 2019-04-25 – 2019-04-26 (×3): 1.2 ug/kg/h via INTRAVENOUS
  Filled 2019-04-23 (×3): qty 100
  Filled 2019-04-23: qty 200
  Filled 2019-04-23 (×14): qty 100

## 2019-04-23 MED ORDER — ENOXAPARIN SODIUM 30 MG/0.3ML ~~LOC~~ SOLN
30.0000 mg | SUBCUTANEOUS | Status: DC
  Administered 2019-04-24 – 2019-04-26 (×3): 30 mg via SUBCUTANEOUS
  Filled 2019-04-23 (×3): qty 0.3

## 2019-04-23 MED ORDER — ETOMIDATE 2 MG/ML IV SOLN
20.0000 mg | Freq: Once | INTRAVENOUS | Status: AC
  Administered 2019-04-23: 20 mg via INTRAVENOUS

## 2019-04-23 MED ORDER — NOREPINEPHRINE 16 MG/250ML-% IV SOLN
0.0000 ug/min | INTRAVENOUS | Status: DC
  Administered 2019-04-23: 2 ug/min via INTRAVENOUS

## 2019-04-23 NOTE — Procedures (Addendum)
Bronchoscopy Procedure Note Jobani Sabado [id number] Mar 27, 1951  Procedure: Bronchoscopy Indications: Concern for mucus plugging, hypoxic resp failure  Diagnostic evaluation of the airways and Obtain specimens for culture and/or other diagnostic studies  Procedure Details Consent: Risks of procedure as well as the alternatives and risks of each were explained to the (patient/caregiver).  Consent for procedure obtained. Time Out: Verified patient identification, verified procedure, site/side was marked, verified correct patient position, special equipment/implants available, medications/allergies/relevent history reviewed, required imaging and test results available.  Performed  In preparation for procedure, patient was given 100% FiO2 and bronchoscope lubricated. Sedation: Etomidate  Airway entered and the following bronchi were examined: RUL, RML, RLL, LUL, LLL and Bronchi.   No mucus plug identified. Aiways are clear with no abnormalities. Bronchial washing performed in RLL, LLL and RML and sent for cultures Bronchoscope removed.  , Patient placed back on 100% FiO2 at conclusion of procedure.    Evaluation Hemodynamic Status: BP stable throughout; O2 sats: stable throughout Patient's Current Condition: stable Specimens:  Sent serosanguinous fluid Complications: No apparent complications Patient did tolerate procedure well.   Jeidi Gilles 04/23/2019

## 2019-04-23 NOTE — Progress Notes (Addendum)
Wasted 30 mL of Fentanyl and 80 mL of versed in steri cycle with Caitlyn Chrapliwy. Additionally wasted 2 mg of Versed and 100 mcg of Fentanyl in steri cycle, drawn for bronchoscopy per Dr. Fredirick Lathe but never used.   Brittny Spangle 04/23/2019 1900h

## 2019-04-23 NOTE — Progress Notes (Signed)
Patient ID: Lawrence Olson, male   DOB: [birth year], 68 y.o.   [name] [id number]   Advanced Heart Failure Team  Note   Primary Physician: Patient, No Pcp Per PCP-Cardiologist:  No primary care provider on file.  Reason for Consultation: Cardiogenic shock  HPI:    Remains intubated. Still febrile to 102. Non-responsive.   Overnight with desats and hypotension. Felt to be mucous plugging. Now on Neo at 20.   Diuresing well. CVP 14-15.  Co-ox 61%. Creatinine up to 2.6  Objective:    Vital Signs:   Temp:  [99.3 F (37.4 C)-101.7 F (38.7 C)] 101.7 F (38.7 C) (08/31 0809) Pulse Rate:  [74-114] 74 (08/31 0800) Resp:  [21-40] 32 (08/31 0800) BP: (84-135)/(51-85) 96/60 (08/31 0800) SpO2:  [86 %-100 %] 95 % (08/31 0800) Arterial Line BP: (89-160)/(45-91) 110/53 (08/31 0800) FiO2 (%):  [60 %-100 %] 100 % (08/31 0800) Weight:  [96.4 kg] 96.4 kg (08/31 0316) Last BM Date: (PTA)  Weight change: Filed Weights   04/21/19 0455 04/22/19 0330 04/23/19 0316  Weight: 97.1 kg 93.3 kg 96.4 kg    Intake/Output:   Intake/Output Summary (Last 24 hours) at 04/23/2019 0855 Last data filed at 04/23/2019 0800 Gross per 24 hour  Intake 3916.09 ml  Output 2805 ml  Net 1111.09 ml      Physical Exam    General: Intubated/sedated HEENT: normal Neck: supple. JVP to jaw Carotids 2+ bilat; no bruits. No lymphadenopathy or thryomegaly appreciated. Cor: PMI nondisplaced. Regular rate & rhythm. No rubs, gallops or murmurs. Lungs: coarse Abdomen: obese soft, nontender, nondistended. No hepatosplenomegaly. No bruits or masses. Good bowel sounds. Extremities: no cyanosis, clubbing, rash, tr edema groin sites ok Neuro: intubated/sedated   Telemetry   NSR 80s Personally reviewed  Labs   Basic Metabolic Panel: Recent Labs  Lab 04/17/19 0547  04/17/19 1254  04/19/19 0359 04/20/19 0452 04/21/19 0345 04/22/19 0248 04/22/19 0347 04/23/19 0343  NA 140   < > 142   < > 140 138 140 141 142 142  K  4.0   < > 3.9   < > 3.8 3.7 3.9 4.3 4.1 4.0  CL 110  --  112*   < > 111 107 106 102  --  103  CO2 19*  --  21*   < > '23 25 26 27  ' --  27  GLUCOSE 251*  --  143*   < > 217* 267* 273* 267*  --  297*  BUN 27*  --  32*   < > 49* 51* 54* 64*  --  76*  CREATININE 1.80*  --  2.06*   < > 2.09* 1.82* 2.00* 2.36*  --  2.65*  CALCIUM 7.3*  --  7.5*   < > 7.7* 8.0* 8.3* 8.7*  --  8.5*  MG 2.0  --  1.9  --  1.8 2.2  --  2.2  --  2.4  PHOS 1.3*  --   --   --   --   --   --  3.6  --  3.4   < > = values in this interval not displayed.    Liver Function Tests: Recent Labs  Lab 04/16/19 1335 04/18/19 0946 04/22/19 0248  AST 166* 102* 45*  ALT 211* 85* 48*  ALKPHOS 83 39 70  BILITOT 0.8 1.4* 1.2  PROT 6.3* 4.6* 5.9*  ALBUMIN 3.5 2.3* 2.3*   No results for input(s): LIPASE, AMYLASE in the last 168 hours. No results  for input(s): AMMONIA in the last 168 hours.  CBC: Recent Labs  Lab 04/16/19 1335  04/20/19 0452 04/20/19 2032 04/21/19 0345 04/22/19 0248 04/22/19 0347 04/23/19 0343  WBC 27.5*   < > 16.0* 13.5* 14.2* 21.0*  --  20.7*  NEUTROABS 19.3*  --   --   --   --   --   --   --   HGB 13.8   < > 7.4* 7.3* 8.2* 9.1* 8.8* 9.0*  HCT 42.9   < > 22.6* 21.6* 24.9* 27.6* 26.0* 28.2*  MCV 100.2*   < > 95.4 93.9 95.0 95.2  --  96.6  PLT 331   < > 124* 132* 129* 180  --  257   < > = values in this interval not displayed.    Cardiac Enzymes: No results for input(s): CKTOTAL, CKMB, CKMBINDEX, TROPONINI in the last 168 hours.  BNP: BNP (last 3 results) No results for input(s): BNP in the last 8760 hours.  ProBNP (last 3 results) No results for input(s): PROBNP in the last 8760 hours.   CBG: Recent Labs  Lab 04/22/19 1554 04/22/19 2023 04/23/19 0020 04/23/19 0352 04/23/19 0805  GLUCAP 235* 285* 323* 276* 282*    Coagulation Studies: Recent Labs    04/22/19 0248  LABPROT 13.5  INR 1.0     Imaging   Dg Chest 1 View  Result Date: 04/22/2019 CLINICAL DATA:  Respiratory  distress. EXAM: CHEST  1 VIEW COMPARISON:  04/22/2019 and prior radiographs FINDINGS: Endotracheal tube with tip 5.5 cm above the carina and NG tube with tip overlying the proximal stomach again noted. A LEFT IJ central venous catheter with tip overlying the UPPER SVC noted. Diffuse bilateral airspace opacities have slightly increased since film earlier today. There is no evidence of pneumothorax.  No other interval change. IMPRESSION: 1. Slightly increased bilateral airspace opacities since film earlier today. No other significant change. Electronically Signed   By: Margarette Canada M.D.   On: 04/22/2019 19:27   Dg Chest Port 1 View  Result Date: 04/23/2019 CLINICAL DATA:  Intubation. EXAM: PORTABLE CHEST 1 VIEW COMPARISON:  04/22/2019. FINDINGS: Endotracheal tube, NG tube, left IJ line in stable position. Cardiomegaly. Diffuse severe bilateral pulmonary infiltrates/edema again noted without interim change. No pleural effusion or pneumothorax. IMPRESSION: 1.  Lines and tubes in stable position. 2. Cardiomegaly. Diffuse severe bilateral pulmonary infiltrates/edema again noted. No interim change. Electronically Signed   By: Bradley   On: 04/23/2019 06:53     Medications:     Current Medications: . sodium chloride   Intravenous Once  . aspirin  81 mg Oral Daily  . atorvastatin  80 mg Per Tube q1800  . chlorhexidine gluconate (MEDLINE KIT)  15 mL Mouth Rinse BID  . Chlorhexidine Gluconate Cloth  6 each Topical Daily  . enoxaparin (LOVENOX) injection  40 mg Subcutaneous Q24H  . famotidine  20 mg Per Tube Daily  . feeding supplement (PRO-STAT SUGAR FREE 64)  30 mL Per Tube BID  . insulin aspart  0-15 Units Subcutaneous Q4H  . insulin detemir  5 Units Subcutaneous BID  . isosorbide mononitrate  30 mg Oral Daily  . mouth rinse  15 mL Mouth Rinse 10 times per day  . potassium chloride  40 mEq Oral Daily  . sodium chloride flush  10 mL Intravenous Q12H    Infusions: . amiodarone 30 mg/hr  (04/23/19 0840)  . dexmedetomidine 0.8 mcg/kg/hr (04/23/19 0800)  . feeding supplement (VITAL AF  1.2 CAL) 1,000 mL (04/23/19 0221)  . levETIRAcetam Stopped (04/22/19 2235)  . milrinone 0.25 mcg/kg/min (04/23/19 0800)  . phenylephrine (NEO-SYNEPHRINE) Adult infusion 20 mcg/min (04/23/19 0839)  . piperacillin-tazobactam (ZOSYN)  IV 12.5 mL/hr at 04/23/19 0800  . vancomycin Stopped (04/22/19 2150)    Assessment/Plan   1. CAD: Anterolateral STEMI, cath 8/24 showed occluded proximal LAD, unable to open.  Once we see how he recovers neurologically, will need to decide on feasibility of re-attempt PCI versus CABG.  - Continue ASA + statin.  2. Cardiogenic shock: Ischemic cardiomyopathy.   - Echo with EF 25%, normal RV.  Impella removed 8/28.  Now remains on milrinone 0.25 and Neo at 20 (restarted with fever and sedation last night).   - Co-ox 60%, - Continue milrinone 0.25 - Switch neo back to NE  - With rise in creatinine will hold lasix today 3. AKI: Suspect hemodynamic, creatinine worse this morning again after hypotension last night with fever.  SBP now back up to 130s and afebrile. Creatinine 1.8 => 2 => 2.36=>2.6 4. Cardiac arrest/VF: 20 minutes downtime, concerned for anoxic brain injury.  Neurology following, per yesterday's note he seems to have intact brainstem function but minimal cortical function. EEG suggestive of diffuse encephalopathy. CT head without definite evidence for anoxic encephalopathy.  No purposeful movement for me. I am concerned about severe anoxic brain injury - Continue amiodarone gtt.  - Continue neuro assessment.  - For brain MRI today.  5. ID: Febrile again to 102 WBCs remain. - Continue vancomycin. Agree with switching cefipime to zosyn. Cultures negative so far 6. Anemia: Hgb down to 6.5-> 2u RBC -> 7.4 -> 8.2 => 9.1. => 9.0 - Transfuse as needed. Keep hgb >= 7.5 7. Acute hypoxic respiratory failure - CCM following.  - Suspect aspiration PNA. PCT 1.08. -  Management as above 8. Elevated LFTs: Suspect shock liver, improving.  9. DVT prophylaxis: Lovenox.   CRITICAL CARE Performed by: Glori Bickers  Total critical care time: 35 minutes  Critical care time was exclusive of separately billable procedures and treating other patients.  Critical care was necessary to treat or prevent imminent or life-threatening deterioration.  Critical care was time spent personally by me (independent of midlevel providers or residents) on the following activities: development of treatment plan with patient and/or surrogate as well as nursing, discussions with consultants, evaluation of patient's response to treatment, examination of patient, obtaining history from patient or surrogate, ordering and performing treatments and interventions, ordering and review of laboratory studies, ordering and review of radiographic studies, pulse oximetry and re-evaluation of patient's condition.   Glori Bickers MD 04/23/2019 8:55 AM

## 2019-04-23 NOTE — Progress Notes (Signed)
NAMEWoodruff Olson, [name]  [id number], DOB:  1951/04/21, LOS: 0 ADMISSION DATE:  04/16/2019, CONSULTATION DATE:  04/16/2019 REFERRING MD:  Claiborne Billings CHIEF COMPLAINT:  STEMI, resp failure  Brief History   68yo M out of hospital cardiac arrest with ROSC, intubated, found to have STEMI, taken emergently to cath lab. Proximal LAD lesion. PCCM consulted for management assistance.   Past Medical History   has no past medical history on file.   has a past surgical history that includes RIGHT HEART CATH AND CORONARY ANGIOGRAPHY (N/A, 04/16/2019) and VENTRICULAR ASSIST DEVICE INSERTION (N/A, 04/16/2019).  Significant Hospital Events   8/24: Cath Lab. PTCI unsuccessful despite multiple attempts.  RA 13 RV 42/16 PA 40/26 (31) PCWP 18 Pa sat 50%  Fick 2.7/1.3 CPO 0.6 Impella placed. VT /p same requiring defib.   8/27: Remains comatose with decorticate posturing.  Still on Impella support.  Weaning vasoactive drips.  At this point major question is neurological recovery.  Would need further cardiac intervention if he were to recover neurologically.  Neurology has been consulted. EEG - negative  8/28 - Impellla removed. Restart abx due to fevers  8/29 -  Daughter says he moves extremities in responds to [place] language.   8/30- Desats with mucus plugs, moving his head towards voice and opeening to voice and trying to focus  Consults:  Cardiology PCCM Heart failure   Procedures:  8/24: PTCI unsuccessful 8/14: Impella L groin>> 8/24: 7.5 ETT>> 8/24: Right femoral venous sheath with PA cath>> 8/24: Right femoral arterial sheath>> 8/24: Left femoral arterial sheath>> 8/24: L IJ CVC >>>  Significant Diagnostic Tests:  Echocardiogram 8/27: Well-positioned Impella cannula.  Continued severely depressed LV function with anteroapical wall motion abnormality.  Micro Data:  [id number]-[id number]: Negative 8/24 blood cultures x2>>> negative Urine culture 8/27:>>> Negative MRSA swab: Positive   Antimicrobials:  Vancomycin and cefepime from 8/25 >8/26, again 8/28 >>  Interim history/subjective:   Started on Neo-Synephrine overnight for hypotension.  Change to Levophed today morning Continues on 100% FiO2 and 12 of PEEP.  Objective    Today's Vitals   04/23/19 0730 04/23/19 0740 04/23/19 0800 04/23/19 0809  BP: 99/64 (!) 109/51 96/60   Pulse: 74 75 74   Resp: (!) 32 (!) 28 (!) 32   Temp:    (!) 101.7 F (38.7 C)  TempSrc:    Oral  SpO2: 95% 94% 95%   Weight:      Height:      PainSc:       Vent Mode: PRVC FiO2 (%):  [60 %-100 %] 100 % Set Rate:  [21 bmp-27 bmp] 21 bmp Vt Set:  [510 mL] 510 mL PEEP:  [10 cmH20-12 cmH20] 12 cmH20 Plateau Pressure:  [21 cmH20-30 cmH20] 29 cmH20  CVP:  [14 mmHg-16 mmHg] 14 mmHg  Examination: Gen:      No acute distress HEENT:  EOMI, sclera anicteric Neck:     No masses; no thyromegaly, ET tube Lungs:    Clear to auscultation bilaterally; normal respiratory effort CV:         Regular rate and rhythm; no murmurs Abd:      + bowel sounds; soft, non-tender; no palpable masses, no distension Ext:    No edema; adequate peripheral perfusion Skin:      Warm and dry; no rash Neuro: Sedated, unresponsive  Resolved Hospital Problem list   N/A  Assessment & Plan:  68 year old male with no known PMH who presents to PCCM in  the cath lab post cardiac arrest while at work with bystander CPR started for a total of 24 minutes and 8 rounds of epi with initial presentation is VF and multiple shocks.  Patient was taken to the cath lab and found to have 100% proximal LAD lesion. PCI unsuccessful so impella placed.    Acute systolic heart failure and cardiogenic shock status post cardiac arrest/ VF- r/t anterior MI, CAD with acute STEMI - 100% prox LAD - PCI unsuccessful  Plan Continue amnio, milrinone, Levophed Holding Lasix due to increased creatinine.  Acute hypoxic respiratory failure -pulmonary edema secondary to MI, cardiac arrest,  cardiogenic shock Plan Continue full vent support Wean down FiO2 as tolerated.  Maintain PEEP at 12  AKI - s/p cardiac arrest  Plan Holding diuresis.   Anemia of critical illness, continues to have intermittent hematuria while on aggrastat heparin gtt for impella) Plan Follow CBC. Continue sq lovenox, aspirin  Mildly elevated LFTs with icteric sclera trending down Plan Follow labs.  Acute encephalopathy - post cardiac arrest. (earlier ccm note: On further questioning it appears as though time to review of spontaneous circulation may have been longer than initially felt.  He opens his eyes however this seems to be spontaneously, and is exhibiting decorticate posturing raising concern for anoxic injury certainly nonpurposeful Plan Neuro on board. MRI, ammonia level  Sepsis Plan Continue Vanco, Zosyn  Hyperglycemia Plan SSI coverage   Best practice Sedation: PAD protocol Nutrition: tubefeeds SUP: H2B DVT prophylaxis:Lovenox Glycemic control: SSI  Family: per primary team  ATTESTATION & SIGNATURE   The patient is critically ill with multiple organ system failure and requires high complexity decision making for assessment and support, frequent evaluation and titration of therapies, advanced monitoring, review of radiographic studies and interpretation of complex data.   Critical Care Time devoted to patient care services, exclusive of separately billable procedures, described in this note is 35 minutes.   Marshell Garfinkel MD [facility] Pager [phone] If no answer call [phone] 04/23/2019, 9:20 AM

## 2019-04-23 NOTE — Progress Notes (Signed)
Inpatient Diabetes Program Recommendations  AACE/ADA: New Consensus Statement on Inpatient Glycemic Control   Target Ranges:  Prepandial:   less than 140 mg/dL      Peak postprandial:   less than 180 mg/dL (1-2 hours)      Critically ill patients:  140 - 180 mg/dL   Results for KEILYN, NADAL (MRN [id number]) as of 04/23/2019 10:51  Ref. Range 04/22/2019 07:46 04/22/2019 11:19 04/22/2019 15:54 04/22/2019 20:23 04/23/2019 00:20 04/23/2019 03:52 04/23/2019 08:05  Glucose-Capillary Latest Ref Range: 70 - 99 mg/dL 223 (H) 302 (H) 235 (H) 285 (H) 323 (H) 276 (H) 282 (H)   Review of Glycemic Control   Current orders for Inpatient glycemic control: Levemir 5 units BID, Novolog 0-15 units Q4H; Vital @ 60 ml/hr  Inpatient Diabetes Program Recommendations:   Insulin-Basal: Please consider increasing Levemir to 10 units BID.  Insulin-Tube Feeding coverage:  Please consider ordering Novolog 5 units Q4H for tube feeding coverage. If tube feeding is stopped or held then Novolog tube feeding coverage should also be stopped or held.  Thanks, Barnie Alderman, RN, MSN, CDE Diabetes Coordinator Inpatient Diabetes Program [phone] (Team Pager from 8am to 5pm)

## 2019-04-23 NOTE — Progress Notes (Signed)
Down at MRI, pt began desaturating to 70s-80s. RT at bedside - suctioned and bagged pt increasing O2 sat to mid 80s. Other vitals stable. Paged CCM Mannam - present during MRI.    Toni Hoffmeister, 04/23/2019 1200h

## 2019-04-23 NOTE — Procedures (Signed)
Bedside Bronchoscopy Procedure Note Lawrence Olson [id number] 04/11/1951  Procedure: Bronchoscopy Indications: Obtain specimens for culture and/or other diagnostic studies and Remove secretions  Procedure Details: ET Tube Size: ET Tube secured at lip (cm): Bite block in place: No In preparation for procedure, Patient hyper-oxygenated with 100 % FiO2 Airway entered and the following bronchi were examined: RUL, RML, RLL, LUL, LLL and Bronchi.   Bronchoscope removed.    Evaluation BP (!) 141/76   Pulse 72   Temp (!) 101.7 F (38.7 C) (Oral)   Resp (!) 39   Ht _0  (1.676 m)   Wt 96.4 kg   SpO2 96%   BMI 34.30 kg/m  Breath Sounds:Clear O2 sats: stable throughout Patient's Current Condition: stable Specimens:  Sent BAL to lab Complications: No apparent complications Patient did tolerate procedure well.   Kathie Dike 04/23/2019, 2:20 PM

## 2019-04-23 NOTE — Progress Notes (Signed)
This note also relates to the following rows which could not be included: SpO2 - Cannot attach notes to unvalidated device data  Vent changes made per MD order.

## 2019-04-23 NOTE — Progress Notes (Signed)
Subjective: No significant changes. With weaning sedation, becomes dyssynchronous with the vent. Nurse describes both flexor and extensor posturing at times.   Exam: Vitals:   04/23/19 0600 04/23/19 0700  BP: 105/70 120/71  Pulse: 77 80  Resp: (!) 36 (!) 40  Temp:    SpO2: 94% 90%   Gen: In bed, NAD Resp: non-labored breathing, no acute distress Abd: soft, nt  Neuro: MS: does not open eyes, He does orient his head towards noxious stimulation, but not clearly purposefully.  YE:LYHTM, corneals intact, eyes slightly dysconjugate, R eye slightly outward compared to left, cough intact Motor: minimal flicker of movement x4 Sensory:as above DTR:2+ and symmetric, toes downgoing.   Pertinent Studies: CT head 8/29 5 days post- arrest - negative for anoxic injury. BMP    Cr 1.45 on arrival ,  2.65 today  Glucose 297 LFTs on arrival:    AST 166    ALT 211   Impression: 68 yo M with persistent encephaloapthy s/p cardiac arrest. MRI brain recommended by Dr. Cheral Marker is pending. At this time, he doe sorient his head towards noxious stimulation with the appearance of cortically driven movement, but no other signs of conciousness.   Recommendations: 1) MRI brain  2) ammonia 3) Will follow.   Roland Rack, MD [facility] [phone]  If 7pm- 7am, please page neurology on call as listed in [place].

## 2019-04-23 NOTE — Progress Notes (Signed)
Nutrition Follow-up  DOCUMENTATION CODES:   Not applicable  INTERVENTION:   -Monitor for BM (day 6 without)  Increase tube feeding:  -VitalAF 1.2@ 71ml/hr 65 ml/hr (1560 ml) via OGT -30 ml Prostat BID  At goal TF provides:2072kcals, 147 grams protein, 1289ml free water. Meets 100% of needs.   NUTRITION DIAGNOSIS:   Increased nutrient needs related to post-op healing as evidenced by estimated needs.  Ongoing  GOAL:   Patient will meet greater than or equal to 90% of their needs  Addressed via TF  MONITOR:   Diet advancement, Vent status, Weight trends, I & O's, TF tolerance, Skin  REASON FOR ASSESSMENT:   Ventilator    ASSESSMENT:   Patient with unknown PMH. Presents this admission with cardiac arrest due to STEMI.   8/24- L groin impella placed, PCI unsuccessful 8/28- impella removed   RD working remotely.  Pt continues on pressors. Lasix stopped given rise in Cr. Diabetes coordinator recommends increase in Levemir given elevated CBGs- will continue to monitor. Per RN, tolerating TF. RD to make adjustments.   Admission weight: 88.4 kg Current weight: 96.4 kg   Patient is currently intubated on ventilator support MV: 14.2 L/min Temp (24hrs), Avg:100.7 F (38.2 C), Min:99.6 F (37.6 C), Max:101.7 F (38.7 C)   I/O: +7228 ml since admit UOP: 1,505 ml x 24 hrs OGT: 1,400 ml x 24 hrs    Drips: amiodarone, precedex, milrinone, levophed Medications: SS novolog, levemir, 20 mEq KCl  Labs: Cr 2.65- increasing BUN 76- increasing CBG 276-323    Diet Order:   Diet Order            Diet NPO time specified  Diet effective now              EDUCATION NEEDS:   Not appropriate for education at this time  Skin:  Skin Assessment: Reviewed RN Assessment  Last BM:  PTA  Height:   Ht Readings from Last 1 Encounters:  04/16/19 5\' 6"  (1.676 m)    Weight:   Wt Readings from Last 1 Encounters:  04/23/19 96.4 kg    Ideal Body Weight:  64.5  kg  BMI:  Body mass index is 34.3 kg/m.  Estimated Nutritional Needs:   Kcal:  2068 kcal  Protein:  129-149 grams  Fluid:  >/= 1.9 L/day   Mariana Single RD, LDN Clinical Nutrition Pager # - [phone]

## 2019-04-24 ENCOUNTER — Inpatient Hospital Stay (HOSPITAL_COMMUNITY): Payer: PPO

## 2019-04-24 DIAGNOSIS — J9602 Acute respiratory failure with hypercapnia: Secondary | ICD-10-CM

## 2019-04-24 DIAGNOSIS — I639 Cerebral infarction, unspecified: Secondary | ICD-10-CM

## 2019-04-24 DIAGNOSIS — J9601 Acute respiratory failure with hypoxia: Secondary | ICD-10-CM

## 2019-04-24 LAB — CBC
HCT: 27.5 % — ABNORMAL LOW (ref 39.0–52.0)
Hemoglobin: 8.8 g/dL — ABNORMAL LOW (ref 13.0–17.0)
MCH: 31.1 pg (ref 26.0–34.0)
MCHC: 32 g/dL (ref 30.0–36.0)
MCV: 97.2 fL (ref 80.0–100.0)
Platelets: 335 10*3/uL (ref 150–400)
RBC: 2.83 MIL/uL — ABNORMAL LOW (ref 4.22–5.81)
RDW: 15.2 % (ref 11.5–15.5)
WBC: 19.4 10*3/uL — ABNORMAL HIGH (ref 4.0–10.5)
nRBC: 0.5 % — ABNORMAL HIGH (ref 0.0–0.2)

## 2019-04-24 LAB — GLUCOSE, CAPILLARY
Glucose-Capillary: 263 mg/dL — ABNORMAL HIGH (ref 70–99)
Glucose-Capillary: 283 mg/dL — ABNORMAL HIGH (ref 70–99)
Glucose-Capillary: 330 mg/dL — ABNORMAL HIGH (ref 70–99)
Glucose-Capillary: 317 mg/dL — ABNORMAL HIGH (ref 70–99)
Glucose-Capillary: 363 mg/dL — ABNORMAL HIGH (ref 70–99)
Glucose-Capillary: 140 mg/dL — ABNORMAL HIGH (ref 70–99)
Glucose-Capillary: 205 mg/dL — ABNORMAL HIGH (ref 70–99)
Glucose-Capillary: 356 mg/dL — ABNORMAL HIGH (ref 70–99)
Glucose-Capillary: 365 mg/dL — ABNORMAL HIGH (ref 70–99)
Glucose-Capillary: 381 mg/dL — ABNORMAL HIGH (ref 70–99)

## 2019-04-24 LAB — CULTURE, RESPIRATORY W GRAM STAIN: Culture: NORMAL

## 2019-04-24 LAB — BASIC METABOLIC PANEL
Anion gap: 12 (ref 5–15)
BUN: 88 mg/dL — ABNORMAL HIGH (ref 8–23)
CO2: 26 mmol/L (ref 22–32)
Calcium: 8 mg/dL — ABNORMAL LOW (ref 8.9–10.3)
Chloride: 104 mmol/L (ref 98–111)
Creatinine, Ser: 2.65 mg/dL — ABNORMAL HIGH (ref 0.61–1.24)
GFR calc Af Amer: 27 mL/min — ABNORMAL LOW (ref >60)
GFR calc non Af Amer: 24 mL/min — ABNORMAL LOW (ref >60)
Glucose, Bld: 343 mg/dL — ABNORMAL HIGH (ref 70–99)
Potassium: 3.4 mmol/L — ABNORMAL LOW (ref 3.5–5.1)
Sodium: 142 mmol/L (ref 135–145)

## 2019-04-24 LAB — COOXEMETRY PANEL
Carboxyhemoglobin: 1.1 % (ref 0.5–1.5)
Methemoglobin: 0.9 % (ref 0.0–1.5)
O2 Saturation: 61.6 %
Total hemoglobin: 8.2 g/dL — ABNORMAL LOW (ref 12.0–16.0)

## 2019-04-24 LAB — LACTATE DEHYDROGENASE: LDH: 599 U/L — ABNORMAL HIGH (ref 98–192)

## 2019-04-24 LAB — ECHOCARDIOGRAM LIMITED
Height: 66 in
Weight: 3209.9 oz

## 2019-04-24 LAB — MAGNESIUM: Magnesium: 2.4 mg/dL (ref 1.7–2.4)

## 2019-04-24 LAB — PHOSPHORUS: Phosphorus: 2.9 mg/dL (ref 2.5–4.6)

## 2019-04-24 LAB — PROCALCITONIN: Procalcitonin: 1.47 ng/mL

## 2019-04-24 MED ORDER — INSULIN DETEMIR 100 UNIT/ML ~~LOC~~ SOLN
10.0000 [IU] | Freq: Two times a day (BID) | SUBCUTANEOUS | Status: DC
  Filled 2019-04-24: qty 0.1

## 2019-04-24 MED ORDER — INSULIN REGULAR(HUMAN) IN NACL 100-0.9 UT/100ML-% IV SOLN
INTRAVENOUS | Status: DC
  Administered 2019-04-24: 13.8 [IU]/h via INTRAVENOUS
  Administered 2019-04-24: 1.5 [IU]/h via INTRAVENOUS
  Administered 2019-04-25: 3.1 [IU]/h via INTRAVENOUS
  Filled 2019-04-24 (×3): qty 100

## 2019-04-24 MED ORDER — FUROSEMIDE 10 MG/ML IJ SOLN
80.0000 mg | Freq: Once | INTRAMUSCULAR | Status: AC
  Administered 2019-04-24: 80 mg via INTRAVENOUS
  Filled 2019-04-24: qty 8

## 2019-04-24 MED ORDER — STROKE: EARLY STAGES OF RECOVERY BOOK
Freq: Once | Status: DC
  Filled 2019-04-24: qty 1

## 2019-04-24 MED ORDER — CHLORHEXIDINE GLUCONATE CLOTH 2 % EX PADS
6.0000 | MEDICATED_PAD | Freq: Every day | CUTANEOUS | Status: DC
  Administered 2019-04-24 – 2019-05-03 (×9): 6 via TOPICAL

## 2019-04-24 NOTE — Evaluation (Signed)
Physical Therapy Evaluation Patient Details Name: Lawrence Olson [name] [id number] DOB: 30-Jan-1951 Today's Date: 04/24/2019   History of Present Illness  68yo M out of hospital cardiac arrest with ROSC, intubated, found to have STEMI, taken emergently to cath lab. Downtime ~20 minutes with anoxic brain injury and significant encephalopathy.  Demonstrating posturing initially, then responding some to family when speaking [place].  Clinical Impression  Performed limited in bed eval for PROM and with daughter present to attempt to allow pt to demonstrate some command following with her translation but without participation.  Patient moving head in seemingly purposeful manner, though not to command.  Feel he will benefit from skilled PT in the acute setting and likely to need SNF or some other inpatient post acute rehab at d/c.     Follow Up Recommendations SNF    Equipment Recommendations  Other (comment)(tba)    Recommendations for Other Services       Precautions / Restrictions Precautions Precaution Comments: intubated Required Braces or Orthoses: Other Brace Other Brace: mitt on R hand      Mobility  Bed Mobility               General bed mobility comments: NT due to vented and sedated  Transfers                    Ambulation/Gait                Stairs            Wheelchair Mobility    Modified Rankin (Stroke Patients Only)       Balance                                             Pertinent Vitals/Pain Pain Assessment: Faces Faces Pain Scale: No hurt    Home Living Family/patient expects to be discharged to:: Private residence   Available Help at Discharge: Family Type of Home: House Home Access: Stairs to enter   Technical brewer of Steps: 1 Home Layout: Two level        Prior Function Level of Independence: Independent               Hand Dominance        Extremity/Trunk Assessment   Upper  Extremity Assessment Upper Extremity Assessment: RUE deficits/detail;LUE deficits/detail RUE Deficits / Details: PROM generally WFL but with limited shoulder elevation due to lines, not moving to command LUE Deficits / Details: PROM generally WFL but with limited shoulder elevation due to lines, not moving to command    Lower Extremity Assessment Lower Extremity Assessment: LLE deficits/detail;RLE deficits/detail RLE Deficits / Details: PROM generally WFL  not moving to command LLE Deficits / Details: PROM generally WFL  not moving to command    Cervical / Trunk Assessment Cervical / Trunk Assessment: Other exceptions Cervical / Trunk Exceptions: restless movement of head during session turning both directions, repositioned with pillow and pt did not respond or demonstrate any reflexive movement with repositioning  Communication   Communication: Prefers language other than [place];Other (comment)(intubated, family reports responds to [place] more than [place])  Cognition Arousal/Alertness: Lethargic;Suspect due to medications Behavior During Therapy: Restless Overall Cognitive Status: Difficult to assess  General Comments General comments (skin integrity, edema, etc.): daughter in room assisting to try to interpret commands into [place]    Exercises Other Exercises Other Exercises: PROM bilateral UE/LE in all directions x 10 each   Assessment/Plan    PT Assessment Patient needs continued PT services  PT Problem List Decreased mobility;Decreased cognition;Cardiopulmonary status limiting activity;Decreased activity tolerance;Decreased strength       PT Treatment Interventions Functional mobility training;Therapeutic exercise;Patient/family education;DME instruction;Therapeutic activities;Balance training    PT Goals (Current goals can be found in the Care Plan section)  Acute Rehab PT Goals Patient Stated Goal: to recover  fully PT Goal Formulation: With family Time For Goal Achievement: 05/08/19 Potential to Achieve Goals: Fair    Frequency Min 2X/week   Barriers to discharge        Co-evaluation               AM-PAC PT "6 Clicks" Mobility  Outcome Measure Help needed turning from your back to your side while in a flat bed without using bedrails?: Total Help needed moving from lying on your back to sitting on the side of a flat bed without using bedrails?: Total Help needed moving to and from a bed to a chair (including a wheelchair)?: Total Help needed standing up from a chair using your arms (e.g., wheelchair or bedside chair)?: Total Help needed to walk in hospital room?: Total Help needed climbing 3-5 steps with a railing? : Total 6 Click Score: 6    End of Session   Activity Tolerance: Treatment limited secondary to medical complications (Comment) Patient left: in bed;with family/visitor present   PT Visit Diagnosis: Other abnormalities of gait and mobility (R26.89);Other symptoms and signs involving the nervous system (R29.898)    Time: 2979-8921 PT Time Calculation (min) (ACUTE ONLY): 15 min   Charges:   PT Evaluation $PT Eval Moderate Complexity: [place], [place] Acute Rehabilitation Services [phone] 04/24/2019   Reginia Naas 04/24/2019, 5:17 PM

## 2019-04-24 NOTE — Progress Notes (Signed)
Inpatient Diabetes Program Recommendations  AACE/ADA: New Consensus Statement on Inpatient Glycemic Control   Target Ranges:  Prepandial:   less than 140 mg/dL      Peak postprandial:   less than 180 mg/dL (1-2 hours)      Critically ill patients:  140 - 180 mg/dL   Results for PRATHAM, CASSATT (MRN [id number]) as of 04/24/2019 08:54  Ref. Range 04/23/2019 00:20 04/23/2019 03:52 04/23/2019 08:05 04/23/2019 14:53 04/23/2019 17:13 04/23/2019 20:10 04/24/2019 00:02 04/24/2019 04:06 04/24/2019 05:06 04/24/2019 07:40  Glucose-Capillary Latest Ref Range: 70 - 99 mg/dL 323 (H) 276 (H) 282 (H) 318 (H) 326 (H) 274 (H) 263 (H) 283 (H) 330 (H) 317 (H)    Review of Glycemic Control  Current orders for Inpatient glycemic control: Levemir 5 units BID, Novolog 0-15 units Q4H; Vital @ 65 ml/hr  Inpatient Diabetes Program Recommendations:   IV Insulin: Glucose has been consistently over 250 mg/dl for the past 24 hours. Please discontinue all insulin orders and use ICU Glycemic Control order set Phase 2, IV insulin to get glucose controlled and help determine insulin needs at this time.  Thanks, Barnie Alderman, RN, MSN, CDE Diabetes Coordinator Inpatient Diabetes Program [phone] (Team Pager from 8am to 5pm)

## 2019-04-24 NOTE — Progress Notes (Signed)
NAMEDallan Olson, [name]  [id number], DOB:  April 09, 1951, LOS: 0 ADMISSION DATE:  04/16/2019, CONSULTATION DATE:  04/16/2019 REFERRING MD:  Claiborne Billings CHIEF COMPLAINT:  STEMI, resp failure  Brief History   68yo M out of hospital cardiac arrest with ROSC, intubated, found to have STEMI, taken emergently to cath lab. Proximal LAD lesion. PCCM consulted for management assistance.   Past Medical History   has no past medical history on file.   has a past surgical history that includes RIGHT HEART CATH AND CORONARY ANGIOGRAPHY (N/A, 04/16/2019) and VENTRICULAR ASSIST DEVICE INSERTION (N/A, 04/16/2019).  Significant Hospital Events   8/24: Cath Lab. PTCI unsuccessful despite multiple attempts.  RA 13 RV 42/16 PA 40/26 (31) PCWP 18 Pa sat 50%  Fick 2.7/1.3 CPO 0.6 Impella placed. VT /p same requiring defib.   8/27: Remains comatose with decorticate posturing.  Still on Impella support.  Weaning vasoactive drips.  At this point major question is neurological recovery.  Would need further cardiac intervention if he were to recover neurologically.  Neurology has been consulted. EEG - negative  8/28 - Impellla removed. Restart abx due to fevers  8/29 -  Daughter says he moves extremities in responds to [place] language.   8/30- Desats with mucus plugs, moving his head towards voice and opeening to voice and trying to focus  8/31 Bronchoscopy to eval for mucus plugs- Clean airways noted  Consults:  Cardiology PCCM Heart failure   Procedures:  8/24: PTCI unsuccessful 8/14: Impella L groin>> 8/24: 7.5 ETT>> 8/24: L IJ CVC >>> 8/28: Rt radial A line  Significant Diagnostic Tests:  Echocardiogram 8/27: Well-positioned Impella cannula.  Continued severely depressed LV function with anteroapical wall motion abnormality.  MRI brain 04/23/2019- punctate infarct in the caudate head.  No clear evidence of hypoxic ischemic injury.  Micro Data:  UDJSH-70: Negative 8/24 blood cultures  x2>>> negative Urine culture 8/27:>>> Negative MRSA swab: Positive BAL 8/31 > no growth  Antimicrobials:  Vancomycin and cefepime from 8/25 >8/26, again 8/28 >>  Interim history/subjective:  Off Levophed.  Continues on milrinone and amiodarone Underwent bronchoscopy yestearday for intermittent desats.  No mucous plugs identified. PEEP and FiO2 are weaning down  Objective    Today's Vitals   04/24/19 0731 04/24/19 0752 04/24/19 0800 04/24/19 0900  BP: (!) 153/54  130/61 119/67  Pulse: 77  87 79  Resp: (!) 30  (!) 35 (!) 32  Temp:  98.6 F (37 C)    TempSrc:  Oral    SpO2: 97%  96% 99%  Weight:      Height:      PainSc:       Vent Mode: PRVC FiO2 (%):  [50 %-100 %] 50 % Set Rate:  [21 bmp] 21 bmp Vt Set:  [510 mL] 510 mL PEEP:  [12 cmH20] 12 cmH20 Plateau Pressure:  [17 cmH20-32 cmH20] 21 cmH20  CVP:  [13 mmHg-17 mmHg] 13 mmHg  Examination: Gen:      No acute distress HEENT:  EOMI, sclera anicteric Neck:     No masses; no thyromegaly, ETT Lungs:    Clear to auscultation bilaterally; normal respiratory effort CV:         Regular rate and rhythm; no murmurs Abd:      + bowel sounds; soft, non-tender; no palpable masses, no distension Ext:    No edema; adequate peripheral perfusion Skin:      Warm and dry; no rash Neuro: Sedated, unresponsive  Resolved Hospital Problem list  N/A  Assessment & Plan:  68 year old male with no known PMH who presents to PCCM in the cath lab post cardiac arrest while at work with bystander CPR started for a total of 24 minutes and 8 rounds of epi with initial presentation is VF and multiple shocks.  Patient was taken to the cath lab and found to have 100% proximal LAD lesion. PCI unsuccessful so impella placed.    Acute systolic heart failure and cardiogenic shock status post cardiac arrest/ VF- r/t anterior MI, CAD with acute STEMI - 100% prox LAD - PCI unsuccessful  Plan Continue amio, milrinone, Levophed Management per cardiology  team  Acute hypoxic respiratory failure -pulmonary edema secondary to MI, cardiac arrest, cardiogenic shock Plan Continue full vent support Wean down FiO2 as tolerated.  Maintain PEEP at 12  AKI - s/p cardiac arrest  Plan Continue lasix Monitor urine output and cr  Anemia of critical illness, continues to have intermittent hematuria while on aggrastat heparin gtt for impella) Plan Follow CBC. Continue sq lovenox, aspirin  Mildly elevated LFTs with icteric sclera trending down Plan Follow labs.  Acute encephalopathy - post cardiac arrest.  MRI reviewed with no clear evidence of ischemic brain insult Plan Neuro on board.  Hopeful for some recovery Continue monitor  Sepsis Plan Continue Vanco, Zosyn Follow cultures  Hyperglycemia Plan Sugars have been consistently elevated Starting insulin drip.  Best practice Sedation: PAD protocol. Precedex, versed PRN Nutrition: tubefeeds SUP: H2B DVT prophylaxis:Lovenox Glycemic control: Insulin drip Family: per primary team  ATTESTATION & SIGNATURE   The patient is critically ill with multiple organ system failure and requires high complexity decision making for assessment and support, frequent evaluation and titration of therapies, advanced monitoring, review of radiographic studies and interpretation of complex data.   Critical Care Time devoted to patient care services, exclusive of separately billable procedures, described in this note is 35 minutes.   Marshell Garfinkel MD [facility] Pager [phone] If no answer call [phone] 04/24/2019, 9:48 AM

## 2019-04-24 NOTE — Progress Notes (Signed)
Patient ID: Lawrence Olson, male   DOB: 08/18/1951, 68 y.o.   [name] [id number]   Advanced Heart Failure Team  Note   Primary Physician: Patient, No Pcp Per PCP-Cardiologist:  No primary care provider on file.  Reason for Consultation: Cardiogenic shock  HPI:    Remains intubated on 50% Fio2. Agitated on vent. Seems more purposeful. Brain MRI yesterday yesterday equivocal without convincing signs of diffuse anoxic injury.   On milrinone 0.25. Off NE. Co-ox 62%   Remains on broad spectrum abx. WBC down slowly. CX negative.  Good diuresis. Weight down. CVP 12    Objective:    Vital Signs:   Temp:  [98.6 F (37 C)-100.5 F (38.1 C)] 98.6 F (37 C) (09/01 0752) Pulse Rate:  [64-87] 79 (09/01 0900) Resp:  [18-40] 32 (09/01 0900) BP: (93-153)/(53-76) 119/67 (09/01 0900) SpO2:  [93 %-100 %] 99 % (09/01 0900) Arterial Line BP: (102-180)/(43-59) 140/55 (09/01 0900) FiO2 (%):  [50 %-100 %] 50 % (09/01 0731) Weight:  [91 kg] 91 kg (09/01 0414) Last BM Date: (PTA)  Weight change: Filed Weights   04/22/19 0330 04/23/19 0316 04/24/19 0414  Weight: 93.3 kg 96.4 kg 91 kg    Intake/Output:   Intake/Output Summary (Last 24 hours) at 04/24/2019 0957 Last data filed at 04/24/2019 0900 Gross per 24 hour  Intake 2633.63 ml  Output 3445 ml  Net -811.37 ml      Physical Exam    General:  Agitated on vent HEENT: normal +ETT Neck: supple. JVP up . Carotids 2+ bilat; no bruits. No lymphadenopathy or thryomegaly appreciated. Cor: PMI nondisplaced. Regular rate & rhythm. No rubs, gallops or murmurs. Lungs: coarse Abdomen: obese soft, nontender, nondistended. No hepatosplenomegaly. No bruits or masses. Good bowel sounds. Extremities: no cyanosis, clubbing, rash, edema + boots Neuro: agitated on vent    Telemetry   NSR 70-80s Personally reviewed  Labs   Basic Metabolic Panel: Recent Labs  Lab 04/19/19 0359 04/20/19 0452 04/21/19 0345 04/22/19 0248 04/22/19 0347 04/23/19 0343  04/24/19 0335  NA 140 138 140 141 142 142 142  K 3.8 3.7 3.9 4.3 4.1 4.0 3.4*  CL 111 107 106 102  --  103 104  CO2 '23 25 26 27  ' --  27 26  GLUCOSE 217* 267* 273* 267*  --  297* 343*  BUN 49* 51* 54* 64*  --  76* 88*  CREATININE 2.09* 1.82* 2.00* 2.36*  --  2.65* 2.65*  CALCIUM 7.7* 8.0* 8.3* 8.7*  --  8.5* 8.0*  MG 1.8 2.2  --  2.2  --  2.4 2.4  PHOS  --   --   --  3.6  --  3.4 2.9    Liver Function Tests: Recent Labs  Lab 04/18/19 0946 04/22/19 0248  AST 102* 45*  ALT 85* 48*  ALKPHOS 39 70  BILITOT 1.4* 1.2  PROT 4.6* 5.9*  ALBUMIN 2.3* 2.3*   No results for input(s): LIPASE, AMYLASE in the last 168 hours. Recent Labs  Lab 04/23/19 0930  AMMONIA 40*    CBC: Recent Labs  Lab 04/20/19 2032 04/21/19 0345 04/22/19 0248 04/22/19 0347 04/23/19 0343 04/24/19 0335  WBC 13.5* 14.2* 21.0*  --  20.7* 19.4*  HGB 7.3* 8.2* 9.1* 8.8* 9.0* 8.8*  HCT 21.6* 24.9* 27.6* 26.0* 28.2* 27.5*  MCV 93.9 95.0 95.2  --  96.6 97.2  PLT 132* 129* 180  --  257 335    Cardiac Enzymes: No results for input(s): CKTOTAL, CKMB,  CKMBINDEX, TROPONINI in the last 168 hours.  BNP: BNP (last 3 results) No results for input(s): BNP in the last 8760 hours.  ProBNP (last 3 results) No results for input(s): PROBNP in the last 8760 hours.   CBG: Recent Labs  Lab 04/23/19 2010 04/24/19 0002 04/24/19 0406 04/24/19 0506 04/24/19 0740  GLUCAP 274* 263* 283* 330* 317*    Coagulation Studies: Recent Labs    04/22/19 0248  LABPROT 13.5  INR 1.0     Imaging   Mr Brain Wo Contrast  Result Date: 04/23/2019 CLINICAL DATA:  Anoxic brain injury. EXAM: MRI HEAD WITHOUT CONTRAST TECHNIQUE: Multiplanar, multiecho pulse sequences of the brain and surrounding structures were obtained without intravenous contrast. COMPARISON:  Head CT 04/21/2019 FINDINGS: Brain: Diffusion imaging does not show evidence of universal anoxic brain injury. There is punctate acute infarction in the right caudate  head. No evidence of universal deep brain insult. No evidence of universal cortical insult. One could question mild cortical ischemic changes in the frontal regions and insula. No evidence of hemorrhage, swelling or shift. No extra-axial collection. Vascular: Major vessels at the base of the brain show flow. Skull and upper cervical spine: Negative Sinuses/Orbits: Clear/normal Other: None IMPRESSION: Punctate acute infarction in the right caudate head. One could question hypoxic ischemic cortical insult in the frontal regions and insular regions, but the findings are not definite. There is not a widespread pattern of definite whole brain hypoxic ischemic insult. Electronically Signed   By: Nelson Chimes M.D.   On: 04/23/2019 12:49     Medications:     Current Medications: .  stroke: mapping our early stages of recovery book   Does not apply Once  . sodium chloride   Intravenous Once  . aspirin  81 mg Oral Daily  . atorvastatin  80 mg Per Tube q1800  . chlorhexidine gluconate (MEDLINE KIT)  15 mL Mouth Rinse BID  . Chlorhexidine Gluconate Cloth  6 each Topical Daily  . enoxaparin (LOVENOX) injection  30 mg Subcutaneous Q24H  . famotidine  20 mg Per Tube Daily  . feeding supplement (PRO-STAT SUGAR FREE 64)  30 mL Per Tube BID  . insulin aspart  0-15 Units Subcutaneous Q4H  . insulin detemir  5 Units Subcutaneous BID  . mouth rinse  15 mL Mouth Rinse 10 times per day  . potassium chloride  40 mEq Oral Daily  . sodium chloride flush  10 mL Intravenous Q12H    Infusions: . amiodarone 30 mg/hr (04/24/19 0940)  . dexmedetomidine 1.2 mcg/kg/hr (04/24/19 0848)  . feeding supplement (VITAL AF 1.2 CAL) 1,000 mL (04/24/19 0624)  . milrinone 0.25 mcg/kg/min (04/24/19 0700)  . norepinephrine (LEVOPHED) Adult infusion Stopped (04/24/19 0408)  . piperacillin-tazobactam (ZOSYN)  IV 12.5 mL/hr at 04/24/19 0700  . vancomycin Stopped (04/23/19 2326)    Assessment/Plan   1. CAD: Anterolateral STEMI,  cath 8/24 showed occluded proximal LAD, unable to open.  Once we see how he recovers neurologically, will need to decide on feasibility of re-attempt PCI versus CABG.  - Continue ASA + statin.  2. Cardiogenic shock: Ischemic cardiomyopathy.   - Echo with EF 25%, normal RV.  Impella removed 8/28.  Now remains on milrinone 0.25 o - Co-ox 62%, - Continue milrinone 0.25 for now. Off NE - No ACE/ARB?ARNI or b-blocker yet with recent AKI and shock - Continue diuresis will give lasix 80 IV today 3. AKI:  -Suspect hemodynamic, creatinine plateauing this morning again after hypotension over the weekend  with fever.   - Keep SBP > 110 - Follow Creatinine 1.8 => 2 => 2.36=>2.6 => 2.65 4. Cardiac arrest/VF: 20 minutes downtime, concerned for anoxic brain injury.  Neurology following, per yesterday's note he seems to have intact brainstem function but minimal cortical function. EEG suggestive of diffuse encephalopathy. CT head without definite evidence for anoxic encephalopathy.  MRI without convincing evidence of anoxic injury - Spoke with Neuro today who feels optimistic that he is recovering from anoxic injury and would warrant trach/peg if not recovering fast enough to wean vent - Continue neuro assessment.  - with small CVA on MRI will get echo with Definity to look for LV thrombus. If present will need AC 5. ID: Febrile again to 100.5 but improving  WBCs trending down - Continue vancomycin/zosyn. Cultures negative so far 6. Anemia:  - Transfuse as needed. Keep hgb >= 7.5 7. Acute hypoxic respiratory failure - CCM following.  - Suspect aspiration PNA. PCT 1.08. - Management as above - Will push diuresis today - Repeat CXR 8. Elevated LFTs: Suspect shock liver, improving.  9. DVT prophylaxis: Lovenox.   CRITICAL CARE Performed by: Glori Bickers  Total critical care time: 45 minutes  Critical care time was exclusive of separately billable procedures and treating other patients.  Critical  care was necessary to treat or prevent imminent or life-threatening deterioration.  Critical care was time spent personally by me (independent of midlevel providers or residents) on the following activities: development of treatment plan with patient and/or surrogate as well as nursing, discussions with consultants, evaluation of patient's response to treatment, examination of patient, obtaining history from patient or surrogate, ordering and performing treatments and interventions, ordering and review of laboratory studies, ordering and review of radiographic studies, pulse oximetry and re-evaluation of patient's condition.   Glori Bickers MD 04/24/2019 9:57 AM

## 2019-04-24 NOTE — Progress Notes (Signed)
Subjective: MRI with no definite evidence of widespread injury, small incidental infarct found.   Exam: Vitals:   04/24/19 0752 04/24/19 0900  BP:  119/67  Pulse:  79  Resp:  (!) 32  Temp: 98.6 F (37 C)   SpO2:  99%   Gen: In bed, NAD Resp: non-labored breathing, no acute distress Abd: soft, nt  Neuro: MS: opens eyes partially to nox stim,  He does orient his head and eyes towards noxious stimulation, appears to attempt to track DU:KGURK, corneals intact, eyes slightly dysconjugate, but improve with level of arousal, crosses midline bilaterally.  cough intact Motor: withdrawal to nox sitm x 4.  Sensory:as above DTR:2+ and symmetric, toes downgoing.   Pertinent Studies: MRI brain - negative.    Impression: 68 yo M with persistent but improving encephaloapthy s/p cardiac arrest. He has not had any deifnite seizures, an d I do not feel that continued levetiracetam is indicated at this time. He has a tiny punctate infarct which is incidental and likely small vessel in etiology, though difficult to completely exclude small embolus in the setting of recent cardiac issues. Marland Kitchen LDL is 103, statrted on high dose lipitor. He is on antiplatelet therapy with ASA which I feel is reasonable. Ammonia of 40 I doubt is playing a significant role.   From an anoxia standpoint, he is making progress and has signs of conciousness. Though there may have been some degree of injury(though not certain degree at this point), I would favor prolonged support from a neuro perspective as he does have possibility of continued improvement.   I discussed the possibility of an embolus with cardiology, I think that even if it was embolic, given the recent manipulations, etc, it would not be completely unexpected. With improving EF, not sure I would anticoagulate solely based on this.   Recommendations: 1) Continue ASA, lipitor for secondary stroke prevention.  2) Discontinue keppra 3) defer to cardiology further  workup needed for cardioembolic source.  4) continue supportive care, including if needed trach/peg for prolonged observation from neuro perspective.  5) neurology will be available on an as-needed basis moving forward, but I would expect slow continued improvement.  Lawrence Rack, MD [facility] [phone]  If 7pm- 7am, please page neurology on call as listed in [place].

## 2019-04-24 NOTE — Progress Notes (Signed)
[facility] Progress Note Patient Name: Lawrence Olson DOB: January 24, 1951 [name] [id number]   Date of Service  04/24/2019  HPI/Events of Note  Informed of hypokalemia of 3.4.  Crea stable at 2.6.   eICU Interventions  Pt has 19meq PO K ordered daily.  No need for further replacement.     Intervention Category Minor Interventions: Electrolytes abnormality - evaluation and management  Elsie Lincoln 04/24/2019, 5:31 AM

## 2019-04-24 NOTE — Progress Notes (Signed)
SLP Cancellation Note  Patient Details Name: Limmie Schoenberg [name] [id number] DOB: [birth year]   Cancelled treatment:       Reason Eval/Treat Not Completed: Medical issues which prohibited therapy;Patient not medically ready(Pt is currently intubated. SLP will follow up. )  Jissel Slavens I. Hardin Negus, [place], [place] Office number [phone] Pager [place] 04/24/2019, 2:02 PM

## 2019-04-24 NOTE — Progress Notes (Signed)
  Echocardiogram 2D Echocardiogram has been performed.  Jannett Celestine 04/24/2019, 12:11 PM

## 2019-04-24 NOTE — Progress Notes (Signed)
Previous shift RN reported foley catheter was removed at 1600. No urinary output has occurred. Bladder scan showed 945 mL of urine. Intermittent straight cath yielded 1000 mL of urine.

## 2019-04-25 LAB — CBC
HCT: 26 % — ABNORMAL LOW (ref 39.0–52.0)
Hemoglobin: 8 g/dL — ABNORMAL LOW (ref 13.0–17.0)
MCH: 30.2 pg (ref 26.0–34.0)
MCHC: 30.8 g/dL (ref 30.0–36.0)
MCV: 98.1 fL (ref 80.0–100.0)
Platelets: 388 10*3/uL (ref 150–400)
RBC: 2.65 MIL/uL — ABNORMAL LOW (ref 4.22–5.81)
RDW: 15.1 % (ref 11.5–15.5)
WBC: 15.3 10*3/uL — ABNORMAL HIGH (ref 4.0–10.5)
nRBC: 0.4 % — ABNORMAL HIGH (ref 0.0–0.2)

## 2019-04-25 LAB — GLUCOSE, CAPILLARY
Glucose-Capillary: 114 mg/dL — ABNORMAL HIGH (ref 70–99)
Glucose-Capillary: 129 mg/dL — ABNORMAL HIGH (ref 70–99)
Glucose-Capillary: 131 mg/dL — ABNORMAL HIGH (ref 70–99)
Glucose-Capillary: 138 mg/dL — ABNORMAL HIGH (ref 70–99)
Glucose-Capillary: 158 mg/dL — ABNORMAL HIGH (ref 70–99)
Glucose-Capillary: 166 mg/dL — ABNORMAL HIGH (ref 70–99)
Glucose-Capillary: 190 mg/dL — ABNORMAL HIGH (ref 70–99)
Glucose-Capillary: 200 mg/dL — ABNORMAL HIGH (ref 70–99)
Glucose-Capillary: 203 mg/dL — ABNORMAL HIGH (ref 70–99)
Glucose-Capillary: 232 mg/dL — ABNORMAL HIGH (ref 70–99)
Glucose-Capillary: 250 mg/dL — ABNORMAL HIGH (ref 70–99)
Glucose-Capillary: 278 mg/dL — ABNORMAL HIGH (ref 70–99)
Glucose-Capillary: 302 mg/dL — ABNORMAL HIGH (ref 70–99)
Glucose-Capillary: 331 mg/dL — ABNORMAL HIGH (ref 70–99)
Glucose-Capillary: 129 mg/dL — ABNORMAL HIGH (ref 70–99)
Glucose-Capillary: 153 mg/dL — ABNORMAL HIGH (ref 70–99)
Glucose-Capillary: 172 mg/dL — ABNORMAL HIGH (ref 70–99)
Glucose-Capillary: 179 mg/dL — ABNORMAL HIGH (ref 70–99)
Glucose-Capillary: 181 mg/dL — ABNORMAL HIGH (ref 70–99)
Glucose-Capillary: 249 mg/dL — ABNORMAL HIGH (ref 70–99)

## 2019-04-25 LAB — COOXEMETRY PANEL
Carboxyhemoglobin: 1.7 % — ABNORMAL HIGH (ref 0.5–1.5)
Methemoglobin: 1.3 % (ref 0.0–1.5)
O2 Saturation: 66.5 %
Total hemoglobin: 8.5 g/dL — ABNORMAL LOW (ref 12.0–16.0)

## 2019-04-25 LAB — BASIC METABOLIC PANEL
Anion gap: 12 (ref 5–15)
BUN: 98 mg/dL — ABNORMAL HIGH (ref 8–23)
CO2: 28 mmol/L (ref 22–32)
Calcium: 7.9 mg/dL — ABNORMAL LOW (ref 8.9–10.3)
Chloride: 108 mmol/L (ref 98–111)
Creatinine, Ser: 3.01 mg/dL — ABNORMAL HIGH (ref 0.61–1.24)
GFR calc Af Amer: 24 mL/min — ABNORMAL LOW (ref >60)
GFR calc non Af Amer: 20 mL/min — ABNORMAL LOW (ref >60)
Glucose, Bld: 133 mg/dL — ABNORMAL HIGH (ref 70–99)
Potassium: 3.4 mmol/L — ABNORMAL LOW (ref 3.5–5.1)
Sodium: 148 mmol/L — ABNORMAL HIGH (ref 135–145)

## 2019-04-25 LAB — CULTURE, BAL-QUANTITATIVE W GRAM STAIN: Culture: NO GROWTH

## 2019-04-25 LAB — LACTATE DEHYDROGENASE: LDH: 787 U/L — ABNORMAL HIGH (ref 98–192)

## 2019-04-25 LAB — MAGNESIUM: Magnesium: 2.5 mg/dL — ABNORMAL HIGH (ref 1.7–2.4)

## 2019-04-25 LAB — PHOSPHORUS: Phosphorus: 3 mg/dL (ref 2.5–4.6)

## 2019-04-25 MED ORDER — QUETIAPINE FUMARATE 50 MG PO TABS
50.0000 mg | ORAL_TABLET | Freq: Two times a day (BID) | ORAL | Status: DC
  Administered 2019-04-25 – 2019-04-26 (×3): 50 mg via ORAL
  Filled 2019-04-25 (×4): qty 1

## 2019-04-25 MED ORDER — INSULIN DETEMIR 100 UNIT/ML ~~LOC~~ SOLN
20.0000 [IU] | Freq: Two times a day (BID) | SUBCUTANEOUS | Status: DC
  Administered 2019-04-25 (×2): 20 [IU] via SUBCUTANEOUS
  Filled 2019-04-25 (×4): qty 0.2

## 2019-04-25 MED ORDER — FENTANYL CITRATE (PF) 100 MCG/2ML IJ SOLN
100.0000 ug | Freq: Once | INTRAMUSCULAR | Status: AC
  Administered 2019-04-25: 100 ug via INTRAVENOUS
  Filled 2019-04-25: qty 2

## 2019-04-25 MED ORDER — MIDAZOLAM HCL 2 MG/2ML IJ SOLN
1.0000 mg | INTRAMUSCULAR | Status: DC | PRN
  Administered 2019-04-25 – 2019-04-30 (×9): 2 mg via INTRAVENOUS
  Filled 2019-04-25 (×12): qty 2

## 2019-04-25 MED ORDER — INSULIN ASPART 100 UNIT/ML ~~LOC~~ SOLN
7.0000 [IU] | SUBCUTANEOUS | Status: DC
  Administered 2019-04-25 – 2019-04-30 (×26): 7 [IU] via SUBCUTANEOUS

## 2019-04-25 MED ORDER — POTASSIUM CHLORIDE 20 MEQ/15ML (10%) PO SOLN
40.0000 meq | Freq: Once | ORAL | Status: AC
  Administered 2019-04-25: 40 meq via ORAL
  Filled 2019-04-25: qty 30

## 2019-04-25 MED ORDER — DEXTROSE 10 % IV SOLN: INTRAVENOUS | Status: DC | PRN

## 2019-04-25 MED ORDER — INSULIN ASPART 100 UNIT/ML ~~LOC~~ SOLN
1.0000 [IU] | SUBCUTANEOUS | Status: DC
  Administered 2019-04-25: 3 [IU] via SUBCUTANEOUS
  Administered 2019-04-26 (×2): 2 [IU] via SUBCUTANEOUS
  Administered 2019-04-26 (×2): 3 [IU] via SUBCUTANEOUS
  Administered 2019-04-26: 2 [IU] via SUBCUTANEOUS
  Administered 2019-04-26 – 2019-04-27 (×2): 1 [IU] via SUBCUTANEOUS
  Administered 2019-04-27: 12:00:00 2 [IU] via SUBCUTANEOUS
  Administered 2019-04-27 (×2): 1 [IU] via SUBCUTANEOUS
  Administered 2019-04-27: 04:00:00 2 [IU] via SUBCUTANEOUS
  Administered 2019-04-27: 01:00:00 3 [IU] via SUBCUTANEOUS
  Administered 2019-04-27 – 2019-04-28 (×2): 1 [IU] via SUBCUTANEOUS
  Administered 2019-04-28 (×2): 2 [IU] via SUBCUTANEOUS
  Administered 2019-04-28 (×2): 1 [IU] via SUBCUTANEOUS
  Administered 2019-04-29 (×3): 2 [IU] via SUBCUTANEOUS
  Administered 2019-04-29: 1 [IU] via SUBCUTANEOUS
  Administered 2019-04-29: 16:00:00 2 [IU] via SUBCUTANEOUS
  Administered 2019-04-30: 3 [IU] via SUBCUTANEOUS

## 2019-04-25 NOTE — Progress Notes (Signed)
Potassium 3.4. Spoke with Ivin Booty, RN with [facility] to notify of labs. No orders received.

## 2019-04-25 NOTE — Progress Notes (Signed)
Patient had no urinary output since straight catheterization at 2200 hours, 04/24/2019. Bladder scan showed greater than 459 mL in bladder. Straight catheterization yielded 650 mL or urine.

## 2019-04-25 NOTE — Progress Notes (Signed)
RT note: patient does not meet SBT criteria this AM due to PEEP of 12.  Tolerating current ventilator settings well.  Will continue to monitor.

## 2019-04-25 NOTE — Progress Notes (Addendum)
NAMEMaycen Olson, [name]  [id number], DOB:  Nov 18, 1950, LOS: 0 ADMISSION DATE:  04/16/2019, CONSULTATION DATE:  04/16/2019 REFERRING MD:  Claiborne Billings CHIEF COMPLAINT:  STEMI, resp failure  Brief History   68yo M out of hospital cardiac arrest with ROSC, intubated, found to have STEMI, taken emergently to cath lab. Proximal LAD lesion. PCCM consulted for management assistance.   Past Medical History   has no past medical history on file.   has a past surgical history that includes RIGHT HEART CATH AND CORONARY ANGIOGRAPHY (N/A, 04/16/2019) and VENTRICULAR ASSIST DEVICE INSERTION (N/A, 04/16/2019).  Significant Hospital Events   8/24: Cath Lab. PTCI unsuccessful despite multiple attempts.  RA 13 RV 42/16 PA 40/26 (31) PCWP 18 Pa sat 50%  Fick 2.7/1.3 CPO 0.6 Impella placed. VT /p same requiring defib.   8/27: Remains comatose with decorticate posturing.  Still on Impella support.  Weaning vasoactive drips.  At this point major question is neurological recovery.  Would need further cardiac intervention if he were to recover neurologically.  Neurology has been consulted. EEG - negative  8/28 - Impellla removed. Restart abx due to fevers  8/29 -  Daughter says he moves extremities in responds to [place] language.   8/30- Desats with mucus plugs, moving his head towards voice and opeening to voice and trying to focus  8/31 Bronchoscopy to eval for mucus plugs- Clean airways noted.   Consults:  Cardiology PCCM Heart failure   Procedures:  8/24: PTCI unsuccessful 8/14: Impella L groin>> 8/24: 7.5 ETT>> 8/24: L IJ CVC >>> 8/28: Rt radial A line 8/31: Bronch with BAL Significant Diagnostic Tests:  Echocardiogram 8/27: Well-positioned Impella cannula.  Continued severely depressed LV function with anteroapical wall motion abnormality.   EEG 8/27: severe, diffuse encephalopathy without specific etiology  MRI brain 04/23/2019- punctate infarct in the caudate head.  No clear  evidence of global hypoxic ischemic injury.  Echo 04/24/2019- severely depressed left ventricular systolic function with global hypokinesis and anteroapical dyskinesis. LVEF 25-30%.   CXR 04/24/19: BILATERAL diffuse pulmonary infiltrates consistent with pneumonia, perhaps slightly improved in the upper lobe   Micro Data:  COVID-19: Negative 8/24 blood cultures x2>>> negative Urine culture 8/27:>>> Negative MRSA swab: Positive BAL 8/31 > no growth  Antimicrobials:  cefepime from 8/24>8/30 Vancomycin 8/25>9/1 Zosyn  8/30 >  Interim history/subjective:  Intubated. Sedated. No significant overnight events  Objective    Today's Vitals   04/25/19 0630 04/25/19 0645 04/25/19 0700 04/25/19 0728  BP:   110/61 110/61  Pulse: 75 76 77 80  Resp: (!) 32 (!) 31 (!) 30 (!) 28  Temp:    (!) 100.6 F (38.1 C)  TempSrc:    Oral  SpO2: 100% 96% 95% 95%  Weight:      Height:      PainSc:    2    Vent Mode: PRVC FiO2 (%):  [50 %] 50 % Set Rate:  [21 bmp] 21 bmp Vt Set:  [510 mL] 510 mL PEEP:  [12 cmH20] 12 cmH20 Plateau Pressure:  [22 cmH20-29 cmH20] 24 cmH20  CVP:  [10 mmHg-14 mmHg] 13 mmHg  Examination: Gen:      No acute distress HEENT:  Head atraumatic, no scleral icterus Neck:     No masses; no thyromegaly, ETT Lungs:    Clear to auscultation bilaterally; normal respiratory effort CV:         Regular rate and rhythm; no murmurs Abd:      + bowel sounds;  soft, non-tender; no palpable masses, no distension Ext:    No edema; adequate peripheral perfusion Skin:      Warm and dry; no rash Neuro:  Spontaneously moving all four extremities. Does not follow commands.   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  Patient was taken to the  cath lab and found to have 100% proximal LAD lesion. PCI unsuccessful so impella placed (removed 8/28).    Acute systolic heart failure and cardiogenic shock status post cardiac arrest/ VF- r/t anterior MI, CAD with acute STEMI - 100% prox LAD - PCI unsuccessful. Impella removed 8/28 Plan Off levophed Continue amio, milrinone.  Management per cardiology team. Re-attempt PCI vs CABG. Continue Asprin + statin  Acute hypoxic respiratory failure -pulmonary edema secondary to MI, cardiac arrest, cardiogenic shock. Some improvement in CXR Vent day #9 Plan Continue full vent support. Will need to consider trach if pt still requiring ventilatory support Wean down FiO2 as tolerated.  Maintain PEEP at 12  AKI - s/p cardiac arrest. Cr trending up: 2.65-->3.0. GFR 20. Hypokalemia-stable--3.4. Mg 2.5 Hypernatremia-slowly trending up. 142-->148 Plan Lasix holiday today D/c vancomycin Monitor urine output and cr Continue K 28mq  Anemia of critical illness. Hgb trending down: 9-->8.8-->8.0. MCV 98. Plan Follow CBC.  Transaminitis. Likely attributable to to liver shock. Slowly improving. Plan Follow labs.  Acute encephalopathy - post cardiac arrest. MRI not convincing for anoxic brain injury. Puntuate infarct of right caudal head. No LV thrombus as source of CVA on 9/1 echo. EEG suggestive of diffuse encephalopathy. Plan Neuro on board.  Will continue to monitor for change Continue monitor  Sepsis. 12H Tmax 100.4. White count 15.3--trending down. Procalc 1.47 on 9/1 PNA--some improvement suggested on CXR. BAL cult from 8/31-no growth at this time. No growth on blood cultures from 8/29.  Plan Continue Zosyn. D/c vancomycin Continue to follow cultures  Hyperglycemia. On insulin drip.  Plan Transition to [place] insulin when glucose <180    Best practice Sedation: PAD protocol. Precedex, versed PRN, Seroquel  Nutrition: tubefeeds GI prophylaxis: pepcid DVT prophylaxis:Lovenox Glycemic  control: Insulin drip. Transitioning to subcut when glucoses consistently <180 Lines: Art line, central line day 8, NG, intubation day 8 Family: per primary team  ATTESTATION & SIGNATURE   The patient is critically ill with multiple organ system failure and requires high complexity decision making for assessment and support, frequent evaluation and titration of therapies, advanced monitoring, review of radiographic studies and interpretation of complex data.   Critical Care Time devoted to patient care services, exclusive of separately billable procedures, described in this note is 35 minutes.   RMitzi Hansen MD Internal Medicine Resident-PGY1 04/25/2019, 7:51 AM

## 2019-04-25 NOTE — Progress Notes (Signed)
Patients arterial line redressed and bag changed out.  Wave form set up on monitor showing good wave form (was turned off by day shift RN).  RT was able to draw back on arterial line.

## 2019-04-25 NOTE — Progress Notes (Signed)
SLP Cancellation Note  Patient Details Name: Lawrence Olson [name] [id number] DOB: 02/06/1951   Cancelled treatment:       Reason Eval/Treat Not Completed: Medical issues which prohibited therapy - remains on vent. Will continue to follow.   [name] [name] 04/25/2019, 7:41 AM  [name], M.A. [place] Acute Environmental education officer [phone] Office [phone]

## 2019-04-25 NOTE — Progress Notes (Signed)
Pt  became agitated off of sedation. HR 120's, SBP 160's, and increased WOB noted.  Discussed with provider who was present at bedside. PRN IVP fentanyl admin.  Pt remained agitated with labored breathing despite suction. This RN asked RT to assess. After RT assessed, provider was notified. Provider assessed pt bedside again and order given for ABG. Will continue to monitor.

## 2019-04-25 NOTE — Progress Notes (Signed)
OT Cancellation Note  Patient Details Name: Virgal Warmuth [name] [id number] DOB: 03/23/1951   Cancelled Treatment:    Reason Eval/Treat Not Completed: Medical issues which prohibited therapy, pt vented with RN reporting increased agitation requesting therapy to hold at this time.  Will follow and see as able.   Delight Stare, OT Acute Rehabilitation Services Pager [phone] Office [phone]    Delight Stare 04/25/2019, 11:23 AM

## 2019-04-25 NOTE — Progress Notes (Signed)
68 year old [place] man with cardiac arrest/STEMI 8/24 status post stent to LAD. Underwent hypothermia protocol, Impella has been removed, he remains on milrinone. Mental status improved somewhat, MRI brain 8/31 showed punctate infarct in the caudate head  Low-grade febrile, agitation, sideways in bed, hypertensive, RA SS +2, does not follow commands to me but apparently did to RN and does better when his daughter speaks to him in his native tongue, decreased breath sounds bilateral, S1-S2 tacky, soft nontender abdomen.  Chest x-ray 9/1 personally reviewed which shows bilateral diffuse pulmonary infiltrates with mild improvement in upper lobes.  Labs show Mild hypokalemia, increased BUN and creatinine, decrease leukocytosis.  Impression/plan   Acute encephalopathy-seems to have improved, persistent delirium, continue Precedex, minimize Versed use, okay to use fentanyl for pain, will add Seroquel 50 twice daily and titrate to effect.  Aspiration pneumonia, leukocytosis improving, low-grade fever-continue Zosyn, DC vancomycin  AKI -diuretics on hold  Cardiogenic shock/acute pulmonary edema-continue milrinone, off Levophed now  Acute respiratory failure -continue PEEP at 12 due to borderline sats, not ready for weaning yet.  Will wait for PEEP to decrease before we can plan on tracheostomy  The patient is critically ill with multiple organ systems failure and requires high complexity decision making for assessment and support, frequent evaluation and titration of therapies, application of advanced monitoring technologies and extensive interpretation of multiple databases. Critical Care Time devoted to patient care services described in this note independent of APP/resident  time is 35 minutes.   Leanna Sato Elsworth Soho MD

## 2019-04-25 NOTE — Plan of Care (Signed)
  Problem: Clinical Measurements: Goal: Ability to maintain clinical measurements within normal limits will improve Outcome: Progressing Goal: Respiratory complications will improve Outcome: Progressing Goal: Cardiovascular complication will be avoided Outcome: Progressing   

## 2019-04-25 NOTE — Progress Notes (Signed)
Upon arrival to do assessment RT noted that patient was extremely agitated and air hungry while on ventilator.  Sats were reading 84%.  Attempted to suction patient and was only able to suction out a moderate amount of thick pink tinged secretions.  MD came to patient room and ordered ABG.  Results have not crossed over to computer but results are as follows:  PH: 7.44, CO2: 39.2, paO2: 47, HCO3: 36.6, sats of 82%.  Increased FIO2 to 100%.  Results given to MD and no further changes on ventilator made.  Will continue to monitor.

## 2019-04-25 NOTE — Progress Notes (Signed)
Patient ID: Lawrence Olson, male   DOB: 1950-09-02, 68 y.o.   [name] [id number]   Advanced Heart Failure Team  Note   Primary Physician: Patient, No Pcp Per PCP-Cardiologist:  No primary care provider on file.  Reason for Consultation: Cardiogenic shock  HPI:    Remains intubated on 50% Fio2. More awake. Will follow commands.   Overnight developed urinary retention and Foley placed. Weight going up. Creatinine rising as well 2.6 -> 3.0. Sodium 148  On milrinone 0.25. Off NE. Co-ox 67%   Remains on broad spectrum abx. Still with temps to 100.6. WBC down. CX negative.  Echo 04/24/19 EF 25-30% no LV thrombus   Objective:    Vital Signs:   Temp:  [98.8 F (37.1 C)-100.6 F (38.1 C)] 100.6 F (38.1 C) (09/02 0728) Pulse Rate:  [66-87] 80 (09/02 0728) Resp:  [13-39] 28 (09/02 0728) BP: (91-139)/(51-67) 110/61 (09/02 0728) SpO2:  [89 %-100 %] 95 % (09/02 0728) Arterial Line BP: (109-180)/(42-58) 153/57 (09/02 0728) FiO2 (%):  [50 %] 50 % (09/02 0728) Weight:  [97.5 kg] 97.5 kg (09/02 0400) Last BM Date: 04/19/19  Weight change: Filed Weights   04/23/19 0316 04/24/19 0414 04/25/19 0400  Weight: 96.4 kg 91 kg 97.5 kg    Intake/Output:   Intake/Output Summary (Last 24 hours) at 04/25/2019 0759 Last data filed at 04/25/2019 0700 Gross per 24 hour  Intake 3716.82 ml  Output 3440 ml  Net 276.82 ml      Physical Exam    General:  Intubated awake will follow some commands HEENT: normal + ETT Neck: supple. JVP up. Carotids 2+ bilat; no bruits. No lymphadenopathy or thryomegaly appreciated. Cor: PMI nondisplaced. Regular rate & rhythm. No rubs, gallops or murmurs. Lungs: clear Abdomen: soft, nontender, + distended. No hepatosplenomegaly. No bruits or masses. Good bowel sounds. Extremities: no cyanosis, clubbing, rash, edema Neuro:Intubated awake will follow some commands   Telemetry   NSR 80s Personally reviewed  Labs   Basic Metabolic Panel: Recent Labs  Lab 04/20/19  0452 04/21/19 0345 04/22/19 0248 04/22/19 0347 04/23/19 0343 04/24/19 0335 04/25/19 0413  NA 138 140 141 142 142 142 148*  K 3.7 3.9 4.3 4.1 4.0 3.4* 3.4*  CL 107 106 102  --  103 104 108  CO2 '25 26 27  ' --  '27 26 28  ' GLUCOSE 267* 273* 267*  --  297* 343* 133*  BUN 51* 54* 64*  --  76* 88* 98*  CREATININE 1.82* 2.00* 2.36*  --  2.65* 2.65* 3.01*  CALCIUM 8.0* 8.3* 8.7*  --  8.5* 8.0* 7.9*  MG 2.2  --  2.2  --  2.4 2.4 2.5*  PHOS  --   --  3.6  --  3.4 2.9 3.0    Liver Function Tests: Recent Labs  Lab 04/18/19 0946 04/22/19 0248  AST 102* 45*  ALT 85* 48*  ALKPHOS 39 70  BILITOT 1.4* 1.2  PROT 4.6* 5.9*  ALBUMIN 2.3* 2.3*   No results for input(s): LIPASE, AMYLASE in the last 168 hours. Recent Labs  Lab 04/23/19 0930  AMMONIA 40*    CBC: Recent Labs  Lab 04/21/19 0345 04/22/19 0248 04/22/19 0347 04/23/19 0343 04/24/19 0335 04/25/19 0413  WBC 14.2* 21.0*  --  20.7* 19.4* 15.3*  HGB 8.2* 9.1* 8.8* 9.0* 8.8* 8.0*  HCT 24.9* 27.6* 26.0* 28.2* 27.5* 26.0*  MCV 95.0 95.2  --  96.6 97.2 98.1  PLT 129* 180  --  257 335 388  Cardiac Enzymes: No results for input(s): CKTOTAL, CKMB, CKMBINDEX, TROPONINI in the last 168 hours.  BNP: BNP (last 3 results) No results for input(s): BNP in the last 8760 hours.  ProBNP (last 3 results) No results for input(s): PROBNP in the last 8760 hours.   CBG: Recent Labs  Lab 04/25/19 0109 04/25/19 0213 04/25/19 0316 04/25/19 0411 04/25/19 0538  GLUCAP 158* 138* 114* 131* 129*    Coagulation Studies: No results for input(s): LABPROT, INR in the last 72 hours.   Imaging   Dg Chest Port 1 View  Result Date: 04/24/2019 CLINICAL DATA:  Pneumonia EXAM: PORTABLE CHEST 1 VIEW COMPARISON:  Portable exam 1020 hours compared to 04/23/2019 FINDINGS: Tip of endotracheal tube projects 4.0 cm above carina. Nasogastric tube extends into stomach. LEFT jugular central venous catheter with tip projecting over SVC. Upper normal  heart size. Diffuse BILATERAL airspace infiltrates consistent with pneumonia, perhaps minimally improved in the upper lobes. No pleural effusion or pneumothorax. Bones demineralized. IMPRESSION: BILATERAL diffuse pulmonary infiltrates consistent with pneumonia, perhaps slightly improved in the upper lobe since the previous study. Electronically Signed   By: Lavonia Dana M.D.   On: 04/24/2019 10:41     Medications:     Current Medications: .  stroke: mapping our early stages of recovery book   Does not apply Once  . sodium chloride   Intravenous Once  . aspirin  81 mg Oral Daily  . atorvastatin  80 mg Per Tube q1800  . chlorhexidine gluconate (MEDLINE KIT)  15 mL Mouth Rinse BID  . Chlorhexidine Gluconate Cloth  6 each Topical Daily  . enoxaparin (LOVENOX) injection  30 mg Subcutaneous Q24H  . famotidine  20 mg Per Tube Daily  . feeding supplement (PRO-STAT SUGAR FREE 64)  30 mL Per Tube BID  . mouth rinse  15 mL Mouth Rinse 10 times per day  . potassium chloride  40 mEq Oral Daily  . potassium chloride  40 mEq Oral Once  . sodium chloride flush  10 mL Intravenous Q12H    Infusions: . amiodarone 30 mg/hr (04/25/19 0600)  . dexmedetomidine 0.5 mcg/kg/hr (04/25/19 4665)  . feeding supplement (VITAL AF 1.2 CAL) 1,000 mL (04/25/19 0218)  . insulin 3.1 Units/hr (04/25/19 0606)  . milrinone 0.25 mcg/kg/min (04/25/19 0600)  . norepinephrine (LEVOPHED) Adult infusion Stopped (04/24/19 0408)  . piperacillin-tazobactam (ZOSYN)  IV 12.5 mL/hr at 04/25/19 0600  . vancomycin Stopped (04/24/19 1951)    Assessment/Plan   1. CAD: Anterolateral STEMI, cath 8/24 showed occluded proximal LAD, unable to open.  Once we see how he recovers neurologically, will need to decide on feasibility of re-attempt PCI versus CABG.  - Continue ASA + statin.   2. Cardiogenic shock: Ischemic cardiomyopathy.   - Echo with EF 25%, normal RV.  Impella removed 8/28.  Now remains on milrinone 0.25 o - Co-ox 66%, -  Continue milrinone 0.25 for now. Off NE - No ACE/ARB?ARNI or b-blocker yet with AKI and shock - With rising creatinine and hypernatremia will hold diuretics for now   3. AKI:  -Suspect hemodynamic, creatinine plateauing this morning again after hypotension over the weekend with fever.   - Keep SBP > 110 - Follow Creatinine 1.8 => 2 => 2.36=>2.6 => 2.65 => 3.01 - Suspect he may need CVVHD soon - Hold diuretics for now. Keep SBP > 110  4. Cardiac arrest/VF: 20 minutes downtime, concerned for anoxic brain injury.  Neurology following, per yesterday's note he seems to have intact  brainstem function but minimal cortical function. EEG suggestive of diffuse encephalopathy. CT head without definite evidence for anoxic encephalopathy.  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 Situation now complicated by worsening renal function  - Neuro status much improved today.  - small CVA on MRI. Echo on 9/1 with Definity -> no LV thrombus.Hold off on systemic AC at this time -   5. ID: Febrile again to 100.6 but improving  WBCs trending down - Continue vancomycin/zosyn. Cultures negative so far - Consider fungal coverage  6. Anemia:  - Transfuse as needed. Keep hgb >= 7.5. Hgb 8.0 today  7. Acute hypoxic respiratory failure - CCM following.  - Suspect aspiration PNA. PCT 1.08. - Management as above - CXR still with diffuse infiltrates - Hold diuretics today - WIll d/w CCM. Neuro status improved but doubt he is stable enough to get off vent yet   8. Elevated LFTs: Suspect shock liver, improving.   9. DVT prophylaxis: Lovenox.    10. Urinary retention - Foley  11. Hypernatremia. - hold diuretics - May need free H2O down tube  CRITICAL CARE Performed by: Glori Bickers  Total critical care time: 40 minutes  Critical care time was  exclusive of separately billable procedures and treating other patients.  Critical care was necessary to treat or prevent imminent or life-threatening deterioration.  Critical care was time spent personally by me (independent of midlevel providers or residents) on the following activities: development of treatment plan with patient and/or surrogate as well as nursing, discussions with consultants, evaluation of patient's response to treatment, examination of patient, obtaining history from patient or surrogate, ordering and performing treatments and interventions, ordering and review of laboratory studies, ordering and review of radiographic studies, pulse oximetry and re-evaluation of patient's condition.   Glori Bickers MD 04/25/2019 7:59 AM

## 2019-04-26 ENCOUNTER — Inpatient Hospital Stay (HOSPITAL_COMMUNITY): Payer: PPO

## 2019-04-26 DIAGNOSIS — N179 Acute kidney failure, unspecified: Secondary | ICD-10-CM

## 2019-04-26 LAB — RENAL FUNCTION PANEL
Albumin: 1.7 g/dL — ABNORMAL LOW (ref 3.5–5.0)
Anion gap: 14 (ref 5–15)
BUN: 123 mg/dL — ABNORMAL HIGH (ref 8–23)
CO2: 26 mmol/L (ref 22–32)
Calcium: 7.6 mg/dL — ABNORMAL LOW (ref 8.9–10.3)
Chloride: 111 mmol/L (ref 98–111)
Creatinine, Ser: 3.91 mg/dL — ABNORMAL HIGH (ref 0.61–1.24)
GFR calc Af Amer: 17 mL/min — ABNORMAL LOW (ref >60)
GFR calc non Af Amer: 15 mL/min — ABNORMAL LOW (ref >60)
Glucose, Bld: 169 mg/dL — ABNORMAL HIGH (ref 70–99)
Phosphorus: 4.5 mg/dL (ref 2.5–4.6)
Potassium: 4.1 mmol/L (ref 3.5–5.1)
Sodium: 151 mmol/L — ABNORMAL HIGH (ref 135–145)

## 2019-04-26 LAB — CULTURE, BLOOD (ROUTINE X 2)
Culture: NO GROWTH
Culture: NO GROWTH
Special Requests: ADEQUATE
Special Requests: ADEQUATE

## 2019-04-26 LAB — NA AND K (SODIUM & POTASSIUM), RAND UR
Potassium Urine: 35 mmol/L
Sodium, Ur: 76 mmol/L

## 2019-04-26 LAB — CBC
HCT: 26.6 % — ABNORMAL LOW (ref 39.0–52.0)
Hemoglobin: 8.2 g/dL — ABNORMAL LOW (ref 13.0–17.0)
MCH: 30.8 pg (ref 26.0–34.0)
MCHC: 30.8 g/dL (ref 30.0–36.0)
MCV: 100 fL (ref 80.0–100.0)
Platelets: 467 10*3/uL — ABNORMAL HIGH (ref 150–400)
RBC: 2.66 MIL/uL — ABNORMAL LOW (ref 4.22–5.81)
RDW: 15.6 % — ABNORMAL HIGH (ref 11.5–15.5)
WBC: 17.1 10*3/uL — ABNORMAL HIGH (ref 4.0–10.5)
nRBC: 0.2 % (ref 0.0–0.2)

## 2019-04-26 LAB — URINALYSIS, ROUTINE W REFLEX MICROSCOPIC
Bacteria, UA: NONE SEEN
Bilirubin Urine: NEGATIVE
Glucose, UA: NEGATIVE mg/dL
Ketones, ur: NEGATIVE mg/dL
Leukocytes,Ua: NEGATIVE
Nitrite: NEGATIVE
Protein, ur: NEGATIVE mg/dL
Specific Gravity, Urine: 1.011 (ref 1.005–1.030)
pH: 5 (ref 5.0–8.0)

## 2019-04-26 LAB — BASIC METABOLIC PANEL
Anion gap: 13 (ref 5–15)
BUN: 117 mg/dL — ABNORMAL HIGH (ref 8–23)
CO2: 28 mmol/L (ref 22–32)
Calcium: 7.6 mg/dL — ABNORMAL LOW (ref 8.9–10.3)
Chloride: 108 mmol/L (ref 98–111)
Creatinine, Ser: 3.64 mg/dL — ABNORMAL HIGH (ref 0.61–1.24)
GFR calc Af Amer: 19 mL/min — ABNORMAL LOW (ref >60)
GFR calc non Af Amer: 16 mL/min — ABNORMAL LOW (ref >60)
Glucose, Bld: 218 mg/dL — ABNORMAL HIGH (ref 70–99)
Potassium: 4 mmol/L (ref 3.5–5.1)
Sodium: 149 mmol/L — ABNORMAL HIGH (ref 135–145)

## 2019-04-26 LAB — TRIGLYCERIDES: Triglycerides: 111 mg/dL (ref <150)

## 2019-04-26 LAB — URINALYSIS, COMPLETE (UACMP) WITH MICROSCOPIC
Bacteria, UA: NONE SEEN
Bilirubin Urine: NEGATIVE
Glucose, UA: NEGATIVE mg/dL
Ketones, ur: NEGATIVE mg/dL
Nitrite: NEGATIVE
Protein, ur: NEGATIVE mg/dL
Specific Gravity, Urine: 1.02 (ref 1.005–1.030)
WBC, UA: NONE SEEN WBC/hpf (ref 0–5)
pH: 5.5 (ref 5.0–8.0)

## 2019-04-26 LAB — COOXEMETRY PANEL
Carboxyhemoglobin: 1.3 % (ref 0.5–1.5)
Methemoglobin: 0.8 % (ref 0.0–1.5)
O2 Saturation: 66.4 %
Total hemoglobin: 7.6 g/dL — ABNORMAL LOW (ref 12.0–16.0)

## 2019-04-26 LAB — LACTATE DEHYDROGENASE: LDH: 594 U/L — ABNORMAL HIGH (ref 98–192)

## 2019-04-26 LAB — GLUCOSE, CAPILLARY
Glucose-Capillary: 122 mg/dL — ABNORMAL HIGH (ref 70–99)
Glucose-Capillary: 133 mg/dL — ABNORMAL HIGH (ref 70–99)
Glucose-Capillary: 143 mg/dL — ABNORMAL HIGH (ref 70–99)
Glucose-Capillary: 151 mg/dL — ABNORMAL HIGH (ref 70–99)
Glucose-Capillary: 169 mg/dL — ABNORMAL HIGH (ref 70–99)
Glucose-Capillary: 179 mg/dL — ABNORMAL HIGH (ref 70–99)
Glucose-Capillary: 180 mg/dL — ABNORMAL HIGH (ref 70–99)
Glucose-Capillary: 213 mg/dL — ABNORMAL HIGH (ref 70–99)
Glucose-Capillary: 237 mg/dL — ABNORMAL HIGH (ref 70–99)

## 2019-04-26 MED ORDER — QUETIAPINE FUMARATE 100 MG PO TABS
100.0000 mg | ORAL_TABLET | Freq: Two times a day (BID) | ORAL | Status: DC
  Administered 2019-04-26 – 2019-05-01 (×9): 100 mg via ORAL
  Filled 2019-04-26 (×9): qty 1

## 2019-04-26 MED ORDER — HEPARIN BOLUS VIA INFUSION (CRRT)
1000.0000 [IU] | INTRAVENOUS | Status: DC | PRN
  Administered 2019-04-26: 1000 [IU] via INTRAVENOUS_CENTRAL
  Filled 2019-04-26 (×2): qty 1000

## 2019-04-26 MED ORDER — PRISMASOL BGK 4/2.5 32-4-2.5 MEQ/L REPLACEMENT SOLN
Status: DC
  Administered 2019-04-26 – 2019-04-29 (×5): via INTRAVENOUS_CENTRAL
  Filled 2019-04-26 (×6): qty 5000

## 2019-04-26 MED ORDER — PRISMASOL BGK 4/2.5 32-4-2.5 MEQ/L REPLACEMENT SOLN
Status: DC
  Administered 2019-04-26 – 2019-04-30 (×11): via INTRAVENOUS_CENTRAL
  Filled 2019-04-26 (×14): qty 5000

## 2019-04-26 MED ORDER — FUROSEMIDE 10 MG/ML IJ SOLN
120.0000 mg | Freq: Once | INTRAVENOUS | Status: DC
  Filled 2019-04-26: qty 12

## 2019-04-26 MED ORDER — CLONAZEPAM 0.5 MG PO TABS
0.5000 mg | ORAL_TABLET | Freq: Two times a day (BID) | ORAL | Status: DC
  Administered 2019-04-26 – 2019-04-29 (×8): 0.5 mg
  Filled 2019-04-26 (×8): qty 1

## 2019-04-26 MED ORDER — SODIUM CHLORIDE 0.9 % IV SOLN
250.0000 [IU]/h | INTRAVENOUS | Status: DC
  Administered 2019-04-26: 550 [IU]/h via INTRAVENOUS_CENTRAL
  Administered 2019-04-26: 250 [IU]/h via INTRAVENOUS_CENTRAL
  Administered 2019-04-26: 1150 [IU]/h via INTRAVENOUS_CENTRAL
  Administered 2019-04-27: 2250 [IU]/h via INTRAVENOUS_CENTRAL
  Administered 2019-04-27: 2150 [IU]/h via INTRAVENOUS_CENTRAL
  Administered 2019-04-27: 07:00:00 1850 [IU]/h via INTRAVENOUS_CENTRAL
  Administered 2019-04-27: 16:00:00 2250 [IU]/h via INTRAVENOUS_CENTRAL
  Administered 2019-04-27: 1250 [IU]/h via INTRAVENOUS_CENTRAL
  Administered 2019-04-28: 1850 [IU]/h via INTRAVENOUS_CENTRAL
  Administered 2019-04-28: 1900 [IU]/h via INTRAVENOUS_CENTRAL
  Administered 2019-04-28: 09:00:00 2100 [IU]/h via INTRAVENOUS_CENTRAL
  Administered 2019-04-28: 1850 [IU]/h via INTRAVENOUS_CENTRAL
  Administered 2019-04-28 (×2): 2200 [IU]/h via INTRAVENOUS_CENTRAL
  Administered 2019-04-29 – 2019-05-01 (×9): 1750 [IU]/h via INTRAVENOUS_CENTRAL
  Filled 2019-04-26 (×11): qty 2
  Filled 2019-04-26: qty 10000
  Filled 2019-04-26 (×2): qty 2
  Filled 2019-04-26: qty 10000
  Filled 2019-04-26 (×4): qty 2
  Filled 2019-04-26: qty 10000
  Filled 2019-04-26 (×2): qty 2
  Filled 2019-04-26: qty 10000
  Filled 2019-04-26: qty 2

## 2019-04-26 MED ORDER — PRISMASOL BGK 4/2.5 32-4-2.5 MEQ/L IV SOLN
INTRAVENOUS | Status: DC
  Administered 2019-04-26 – 2019-04-29 (×17): via INTRAVENOUS_CENTRAL
  Filled 2019-04-26 (×29): qty 5000

## 2019-04-26 MED ORDER — INSULIN DETEMIR 100 UNIT/ML ~~LOC~~ SOLN
25.0000 [IU] | Freq: Two times a day (BID) | SUBCUTANEOUS | Status: DC
  Administered 2019-04-26 – 2019-04-28 (×5): 25 [IU] via SUBCUTANEOUS
  Filled 2019-04-26 (×6): qty 0.25

## 2019-04-26 MED ORDER — PROPOFOL 1000 MG/100ML IV EMUL
5.0000 ug/kg/min | INTRAVENOUS | Status: DC
  Administered 2019-04-26: 25 ug/kg/min via INTRAVENOUS
  Administered 2019-04-26: 5 ug/kg/min via INTRAVENOUS
  Administered 2019-04-27: 25 ug/kg/min via INTRAVENOUS
  Administered 2019-04-27: 13:00:00 20 ug/kg/min via INTRAVENOUS
  Administered 2019-04-27: 35 ug/kg/min via INTRAVENOUS
  Administered 2019-04-27: 08:00:00 25 ug/kg/min via INTRAVENOUS
  Administered 2019-04-28 (×2): 40 ug/kg/min via INTRAVENOUS
  Administered 2019-04-28: 07:00:00 15 ug/kg/min via INTRAVENOUS
  Filled 2019-04-26 (×9): qty 100
  Filled 2019-04-26: qty 200

## 2019-04-26 MED ORDER — FUROSEMIDE 10 MG/ML IJ SOLN
120.0000 mg | Freq: Two times a day (BID) | INTRAVENOUS | Status: DC
  Administered 2019-04-26: 120 mg via INTRAVENOUS
  Filled 2019-04-26: qty 12
  Filled 2019-04-26: qty 10

## 2019-04-26 MED ORDER — B COMPLEX-C PO TABS
1.0000 | ORAL_TABLET | Freq: Every day | ORAL | Status: DC
  Administered 2019-04-27 – 2019-04-30 (×4): 1 via ORAL
  Filled 2019-04-26 (×5): qty 1

## 2019-04-26 MED ORDER — HEPARIN SODIUM (PORCINE) 1000 UNIT/ML DIALYSIS
1000.0000 [IU] | INTRAMUSCULAR | Status: DC | PRN
  Administered 2019-04-26 (×2): 4200 [IU] via INTRAVENOUS_CENTRAL
  Administered 2019-04-26: 20:00:00 1000 [IU] via INTRAVENOUS_CENTRAL
  Administered 2019-05-01: 2400 [IU] via INTRAVENOUS_CENTRAL
  Filled 2019-04-26 (×3): qty 6
  Filled 2019-04-26: qty 4
  Filled 2019-04-26 (×2): qty 6

## 2019-04-26 MED ORDER — PIPERACILLIN-TAZOBACTAM 3.375 G IVPB 30 MIN
3.3750 g | Freq: Four times a day (QID) | INTRAVENOUS | Status: DC
  Administered 2019-04-27 – 2019-04-28 (×6): 3.375 g via INTRAVENOUS
  Filled 2019-04-26 (×11): qty 50

## 2019-04-26 MED ORDER — HEPARIN SODIUM (PORCINE) 5000 UNIT/ML IJ SOLN
5000.0000 [IU] | Freq: Three times a day (TID) | INTRAMUSCULAR | Status: DC
  Administered 2019-04-27 – 2019-05-04 (×24): 5000 [IU] via SUBCUTANEOUS
  Filled 2019-04-26 (×23): qty 1

## 2019-04-26 NOTE — Progress Notes (Signed)
RT replaced patient's ETT holder. Patient tolerated well. RT will continue to monitor.

## 2019-04-26 NOTE — Progress Notes (Signed)
ACT remains 153 the last three times RN took lab. No complications with CRRT observed. Patient remains stable. RN paged Dr. Johnney Ou. Physician ordered PT and PTT with morning labs. Will continue to monitor patient and CRRT.

## 2019-04-26 NOTE — Progress Notes (Signed)
SLP Cancellation Note  Patient Details Name: Lawrence Olson [name] [id number] DOB: [birth year]   Cancelled treatment:       Reason Eval/Treat Not Completed: Medical issues which prohibited therapy(Pt remains on vent at this time. SLP will follow up. )  Shellie Goettl I. Hardin Negus, [place], [place] Office number [phone] Pager [place] 04/26/2019, 8:11 AM

## 2019-04-26 NOTE — Progress Notes (Signed)
Pharmacy Antibiotic Note  Lawrence Olson is a 68 y.o. male admitted on 04/16/2019 with cardiogenic shock s/p arrest. Pt on ABX initially then D/Cd on 8/26. Restarted 8/28 for pneumonia. CXR with persistent but not worsening infiltrates. WBC remains elevated at 17.1 and temp 100. SCr worsening to 3.64.  Plan: Continue Zosyn 3.375g q8 hours x 7 days per CCM Follow renal function, plans for HD/CRRT  Height: _0  (167.6 cm) Weight: 216 lb 0.8 oz (98 kg) IBW/kg (Calculated) : 63.8  Temp (24hrs), Avg:99.4 F (37.4 C), Min:98.6 F (37 C), Max:100 F (37.8 C)  Recent Labs  Lab 04/22/19 0248 04/23/19 0343 04/24/19 0335 04/25/19 0413 04/26/19 0445  WBC 21.0* 20.7* 19.4* 15.3* 17.1*  CREATININE 2.36* 2.65* 2.65* 3.01* 3.64*    Estimated Creatinine Clearance: 21.3 mL/min (A) (by C-G formula based on SCr of 3.64 mg/dL (H)).    No Known Allergies  Antimicrobials this admission: Vancomycin 8/24>>8/25; 8/28>>9/2 Cefepime 8/24>>8/26; 8/28>>8/30 Zosyn 8/30>>9/6  Microbiology results: 8/31 BAL - ngF 8/29 BCx: ngtd 8/29 Resp Cx: normal flora 8/24 BCx: ngF 8/24 COVID: neg 8/24 MRSA: positive  Thank you for allowing pharmacy to be a part of this patient's care.  Vertis Kelch, PharmD PGY2 Cardiology Pharmacy Resident Phone [phone] 04/26/2019       8:53 AM  Please check [email or web address] for unit-specific pharmacist phone numbers

## 2019-04-26 NOTE — Progress Notes (Addendum)
Nutrition Follow-up  DOCUMENTATION CODES:   Not applicable  INTERVENTION:   -Recommend addition of B complex with Vitamin C  Continue tube feeding:  -VitalAF 1.2@ 65 ml/hr (1560 ml) via OGT -30 ml Prostat BID  TF provides:2072kcals (2304 kcal with propofol), 147 grams protein, 127ml free water. Meets 102% of kcal needs and 100% of protein needs.   NUTRITION DIAGNOSIS:   Increased nutrient needs related to post-op healing as evidenced by estimated needs.  Ongoing  GOAL:   Patient will meet greater than or equal to 90% of their needs  Addressed via TF  MONITOR:   Diet advancement, Vent status, Weight trends, I & O's, TF tolerance, Skin  REASON FOR ASSESSMENT:   Ventilator    ASSESSMENT:   Patient with unknown PMH. Presents this admission with cardiac arrest due to STEMI.   8/24- L groin impella placed, PCI unsuccessful 8/28- impella removed  9/3- CRRT start   Pt discussed during ICU rounds and with RN.   Unable to toleration sedation wean- started on propofol. Requiring pressors. UOP improving but Cr continues to rise. Start CRRT today- UF goal 100-150 ml/hr. Hypernatremia persist- consider adding free water flushes. Pt tolerating tube feeding at goal- continue current interventions.   Admission weight: 88.4 kg Current weight: 98 kg- trending back up  Patient is currently intubated on ventilator support MV: 15.6 L/min Temp (24hrs), Avg:99.5 F (37.5 C), Min:98.6 F (37 C), Max:100 F (37.8 C)   Propofol: 8.8 ml/hr- provides 232 kcal from lipids daily  I/O: +6,970 ml since admit UOP: 2,610 ml x 24 hrs   Drips: amiodarone, 120 mg lasix, milrinone, propofol Medications: SS novolog, levemir, 20 mEq KCl daily Labs: Na 148 (H) Cr 3.64-trending up CBG 122-237  Diet Order:   Diet Order            Diet NPO time specified  Diet effective now              EDUCATION NEEDS:   Not appropriate for education at this time  Skin:  Skin Assessment:  Reviewed RN Assessment  Last BM:  9/3  Height:   Ht Readings from Last 1 Encounters:  04/16/19 5\' 6"  (1.676 m)    Weight:   Wt Readings from Last 1 Encounters:  04/26/19 98 kg    Ideal Body Weight:  64.5 kg  BMI:  Body mass index is 34.87 kg/m.  Estimated Nutritional Needs:   Kcal:  2253 kcal  Protein:  129-149 grams  Fluid:  >/= 1.9 L/day   Mariana Single RD, LDN Clinical Nutrition Pager # - [phone]

## 2019-04-26 NOTE — Progress Notes (Signed)
   Trialysis cath placed earlier today. Now on CRRT pulling about 150/hr. Oxygenation much improved. Dow to 70% Fio2  On heparin. No bleeding.   On propofol gtt for sedation. BP tolerating both. Rhythm stable.  Still with low-grade temps. On zosyn,  Discussed with RN regarding family meeting tomorrow to discuss [place] including possible trach next week.  Will check labs and ABG later tonight.  Additional 35 mins CCT.   Glori Bickers, MD  10:27 PM

## 2019-04-26 NOTE — Progress Notes (Signed)
OG tube advanced 13 cm based on CXR. Tube now at 76 cm.

## 2019-04-26 NOTE — Plan of Care (Signed)
  Problem: Clinical Measurements: Goal: Ability to maintain clinical measurements within normal limits will improve Outcome: Progressing Goal: Will remain free from infection Outcome: Progressing Goal: Cardiovascular complication will be avoided Outcome: Progressing   Problem: Nutrition: Goal: Adequate nutrition will be maintained Outcome: Progressing   Problem: Education: Goal: Knowledge of General Education information will improve Description: Including pain rating scale, medication(s)/side effects and non-pharmacologic comfort measures Outcome: Not Progressing   Problem: Health Behavior/Discharge Planning: Goal: Ability to manage health-related needs will improve Outcome: Not Progressing   Problem: Clinical Measurements: Goal: Diagnostic test results will improve Outcome: Not Progressing Goal: Respiratory complications will improve Outcome: Not Progressing   Problem: Activity: Goal: Risk for activity intolerance will decrease Outcome: Not Progressing

## 2019-04-26 NOTE — Progress Notes (Signed)
Pharmacy Antibiotic Note  Lawrence Olson is a 68 y.o. male admitted on 04/16/2019 with cardiogenic shock s/p arrest. Pt on ABX initially then D/Cd on 8/26. Restarted 8/28 for pneumonia. CXR with persistent but not worsening infiltrates.   CRRT initiated at 16:10PM and running well.  WBC remains elevated at 17.1. Patient is afebrile.    Plan: Adjusted Zosyn to 3.375g IV every 6 hours. Stop date maintained for 9/6.  Follow CRRT plans and toleration Adjust dosing as needed.   Height: _0  (167.6 cm) Weight: 216 lb 0.8 oz (98 kg) IBW/kg (Calculated) : 63.8  Temp (24hrs), Avg:99.3 F (37.4 C), Min:97.9 F (36.6 C), Max:100 F (37.8 C)  Recent Labs  Lab 04/22/19 0248 04/23/19 0343 04/24/19 0335 04/25/19 0413 04/26/19 0445 04/26/19 1600  WBC 21.0* 20.7* 19.4* 15.3* 17.1*  --   CREATININE 2.36* 2.65* 2.65* 3.01* 3.64* 3.91*    Estimated Creatinine Clearance: 19.8 mL/min (A) (by C-G formula based on SCr of 3.91 mg/dL (H)).    No Known Allergies  Antimicrobials this admission: Vancomycin 8/24>>8/25; 8/28>>9/2 Cefepime 8/24>>8/26; 8/28>>8/30 Zosyn 8/30>>9/6  Microbiology results: 8/31 BAL - ngF 8/29 BCx: ngtd 8/29 Resp Cx: normal flora 8/24 BCx: ngF 8/24 COVID: neg 8/24 MRSA: positive  Thank you for allowing pharmacy to be a part of this patient's care.  Sloan Leiter, PharmD, BCPS, BCCCP Clinical Pharmacist Clinical phone 04/26/2019 until 9:30 [phone] Please refer to [facility] for [place] numbers 04/26/2019       8:46 PM  Please check [email or web address] for unit-specific pharmacist phone numbers

## 2019-04-26 NOTE — Progress Notes (Signed)
68 year old [place] man with cardiac arrest/STEMI 8/24 status post stent to LAD. Underwent hypothermia protocol, Impella was removed Mental status improved somewhat, MRI brain 8/31 showed punctate infarct in the caudate head  He remains critically ill, increased FiO2 and PEEP requirements this morning 200%/PEEP of 15 with episodes of desaturation, good urine output about 2.6 L last 24 hours, CVP 15, mildly agitated, Kussmaul breathing, minimal secretions, decreased breath sounds bilateral, soft nontender abdomen, S1-S2 tacky.  Labs show increasing sodium, BUN and creatinine, increasing leukocytosis, stable anemia, SCV O2 66% on milrinone.  Chest x-ray 9/3 personally reviewed which shows stable bilateral alveolar infiltrates in the pattern of pulmonary edema but cannot rule out underlying pneumonia.  Impression/plan Acute respiratory failure with hypoxia bilateral infiltrates?  Pulmonary edema versus pneumonia-infiltrates seem to develop 3 days after hospitalization, cultures including BAL negative,, continue Zosyn empiric for aspiration pneumonia -Now requiring PEEP of 15 with increased FiO2-in spite of adequate diuresis  AKI -taking renal input but seems to be cardiorenal, Lasix 120 seems to work in terms of urine output but clearly with rising BUN this is heading towards ATN  Acute encephalopathy-need better sedation until hypoxia improves, will change to propofol and discontinue Precedex, increase Seroquel 200 twice daily and add low-dose clonazepam 0.5 twice daily, continue fentanyl PRN  Cardiogenic shock-continue milrinone per heart failure service  Updated daughter in detail, he may need tracheostomy eventually once hypoxia has improved, discussed with renal and will with dialysis  The patient is critically ill with multiple organ systems failure and requires high complexity decision making for assessment and support, frequent evaluation and titration of therapies, application of advanced  monitoring technologies and extensive interpretation of multiple databases. Critical Care Time devoted to patient care services described in this note independent of APP/resident  time is 35 minutes.   Leanna Sato Elsworth Soho MD

## 2019-04-26 NOTE — Procedures (Signed)
Hemodialysis Catheter Insertion Procedure Note Lawrence Olson [id number] [birth year]  Procedure: Insertion of Hemodialysis Catheter Indications: Dialysis Access   Procedure Details Consent: Risks of procedure as well as the alternatives and risks of each were explained to the (patient/caregiver).  Consent for procedure obtained. Time Out: Verified patient identification, verified procedure, site/side was marked, verified correct patient position, special equipment/implants available, medications/allergies/relevent history reviewed, required imaging and test results available.  Performed  Maximum sterile technique was used including antiseptics, cap, gloves, gown, hand hygiene, mask and sheet. Skin prep: Chlorhexidine; local anesthetic administered Triple lumen hemodialysis catheter was inserted into right internal jugular vein using the Seldinger technique. And verified with [place]  Evaluation Blood flow good Complications: No apparent complications Patient did tolerate procedure well. Chest X-ray ordered to verify placement.  CXR: pending.   Lawrence Olson Lawrence Olson 04/26/2019

## 2019-04-26 NOTE — Progress Notes (Signed)
NAMEEithen Olson, [name]  [id number], DOB:  26-Nov-1950, LOS: 0 ADMISSION DATE:  04/16/2019, CONSULTATION DATE:  04/16/2019 REFERRING MD:  Claiborne Billings CHIEF COMPLAINT:  STEMI, resp failure  Brief History   68yo M out of hospital cardiac arrest with ROSC, intubated, found to have STEMI, taken emergently to cath lab. Proximal LAD lesion. PCCM consulted for management assistance.   Past Medical History   has no past medical history on file.   has a past surgical history that includes RIGHT HEART CATH AND CORONARY ANGIOGRAPHY (N/A, 04/16/2019) and VENTRICULAR ASSIST DEVICE INSERTION (N/A, 04/16/2019).  Significant Hospital Events   8/24: Cath Lab. PTCI unsuccessful despite multiple attempts.  RA 13 RV 42/16 PA 40/26 (31) PCWP 18 Pa sat 50%  Fick 2.7/1.3 CPO 0.6 Impella placed. VT /p same requiring defib.   8/27: Remains comatose with decorticate posturing.  Still on Impella support.  Weaning vasoactive drips.  At this point major question is neurological recovery.  Would need further cardiac intervention if he were to recover neurologically.  Neurology has been consulted. EEG - negative  8/28 - Impellla removed. Restart abx due to fevers  8/29 -  Daughter says he moves extremities in responds to Lawrence Olson language.   8/30- Desats with mucus plugs, moving his head towards voice and opeening to voice and trying to focus  8/31 Bronchoscopy to eval for mucus plugs- Clean airways noted.   Consults:  Cardiology PCCM Heart failure   Procedures:  8/24: PTCI unsuccessful 8/14: Impella L groin>> 8/24: 7.5 ETT>> 8/24: L IJ CVC >>> 8/28: Rt radial A line 8/31: Bronch with BAL Significant Diagnostic Tests:  Echocardiogram 8/27: Well-positioned Impella cannula.  Continued severely depressed LV function with anteroapical wall motion abnormality.   EEG 8/27: severe, diffuse encephalopathy without specific etiology  MRI brain 04/23/2019- punctate infarct in the caudate head.  No clear  evidence of global hypoxic ischemic injury.  Echo 04/24/2019- severely depressed left ventricular systolic function with global hypokinesis and anteroapical dyskinesis. LVEF 25-30%.   CXR 04/24/19: BILATERAL diffuse pulmonary infiltrates consistent with pneumonia, perhaps slightly improved in the upper lobe   CXR 04/26/19: no interval change in interstitial and airspace opacities. Small pleural effusions present. Micro Data:  [id number]: Negative 8/24 blood cultures x2>>> negative Urine culture 8/27:>>> Negative MRSA swab: Positive BAL 8/31 > no growth  Antimicrobials:  cefepime from 8/24>8/30 Vancomycin 8/25>9/1 Zosyn  8/30 >  Interim history/subjective:  Remains intubated/sedated. Sedation attempted to be weaned yesterday however patient became very agitated with increased HR and BP.  Called to room this morning for decrease O2 sats. PEEP increased to 15 with FiO2 increased to 100%.   Objective    Today's Vitals   04/26/19 0500 04/26/19 0600 04/26/19 0657 04/26/19 0746  BP:      Pulse: 72 72    Resp: (!) 23 (!) 24    Temp:    100 F (37.8 C)  TempSrc:    Axillary  SpO2: 98% 94%    Weight:   98 kg   Height:      PainSc:       Vent Mode: PRVC FiO2 (%):  [80 %-100 %] 80 % Set Rate:  [21 bmp] 21 bmp Vt Set:  [510 mL] 510 mL PEEP:  [12 cmH20] 12 cmH20 Plateau Pressure:  [26 cmH20-29 cmH20] 29 cmH20  CVP:  [11 mmHg-15 mmHg] 12 mmHg  Examination: Gen:      No acute distress HEENT:  Head atraumatic, no scleral icterus Neck:  No masses; no thyromegaly, ETT Lungs:    Bibasilar crackles and rhonchi. Diminished lung sounds on the right.  CV:         Tachycardic rate and regular rhythm; no murmurs Abd:      + bowel sounds; soft, non-tender; no palpable masses, no distension Ext:    +1 edema to bilateral lower extremities Skin:      Warm and dry; no rash Neuro:  Spontaneously moving all four extremities. Does not follow commands.   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  Patient was taken to the cath lab and found to have 100% proximal LAD lesion. PCI unsuccessful so impella placed (removed 8/28).    Acute systolic heart failure and cardiogenic shock status post cardiac arrest/ VF- r/t anterior MI, CAD with acute STEMI - 100% prox LAD - PCI unsuccessful. Impella removed 8/28. EF 25-30% with significant wall motion abnormalities. Plan Continue amio, milrinone.  Management per cardiology team. Re-attempt PCI vs CABG. Continue Asprin + statin for now  Acute hypoxic respiratory failure -pulmonary edema secondary to MI, cardiac arrest, cardiogenic shock vs infection. No significant interval changes on CXR. Given length of time he has been on antibiotics, would expect some improvement. Increased ventilatory requirement--PEEP 15, FiO2 100%. Suspect this is secondary to combination of inadequate sedation and CXR findings. Vent day #9 Plan Continue full vent support. Will need to consider trach/peg if pt still requiring ventilatory support Precidex stopped as it does not seem to be having significant impact Propofol started Fentanyl drip  AKI - s/p cardiac arrest. Cr trending up: 2.65-->3.0-->3.6. GFR 20. Hypernatremia-slowly trending up. 142-->148-->149 UOP is good. Still +7L from admission Bilateral lower extremity edema present today. CXR brings into question pneumonia vs pulm edema.  Plan 150m lasix today Monitor urine output and cr Continue K 425m  Anemia of critical illness. Hgb trending down. Plan Follow CBC.  Transaminitis. Likely attributable to to liver shock. Slowly improving. Plan Follow labs.  Acute encephalopathy - post cardiac arrest. MRI not convincing for anoxic brain injury. Puntuate  infarct of right caudal head. No findings on echo consistent with cardiogenic source. EEG suggestive of diffuse encephalopathy. Plan Neuro on board.  Will continue to monitor for change  Sepsis. Fevers improving. Leukocytosis stable. Plan Continue Zosyn--day #5 Continue to follow cultures  Hyperglycemia. Transitioned to subcut insulin yesterday. Blood sugars running a little high.  Plan Increase detrimer to 25U   Best practice Sedation: PAD protocol. Precedex, versed PRN, Seroquel  Nutrition: tubefeeds GI prophylaxis: pepcid DVT prophylaxis:Lovenox Glycemic control: SSI + detemir  Lines: Art line, central line day 9, NG, intubation day #9 Family: per primary team  ATTESTATION & SIGNATURE   The patient is critically ill with multiple organ system failure and requires high complexity decision making for assessment and support, frequent evaluation and titration of therapies, advanced monitoring, review of radiographic studies and interpretation of complex data.   Critical Care Time devoted to patient care services, exclusive of separately billable procedures, described in this note is 35 minutes.   RyMitzi HansenMD Internal Medicine Resident-PGY1 04/26/2019, 7:46 AM

## 2019-04-26 NOTE — Progress Notes (Signed)
OT Cancellation Note  Patient Details Name: Lawrence Olson [name] [id number] DOB: 1950/09/05   Cancelled Treatment:    Reason Eval/Treat Not Completed: Patient not medically ready;Medical issues which prohibited therapy(Pt intubated and sedated.) OT signing off and will await reorder.  Malka So 04/26/2019, 2:52 PM  Nestor Lewandowsky, OTR/L Acute Rehabilitation Services Pager: [phone] Office: [phone]

## 2019-04-26 NOTE — Progress Notes (Signed)
Patient ID: Lawrence Olson, male   DOB: [birth year], 68 y.o.   [name] [id number]   Advanced Heart Failure Team  Note   Primary Physician: Patient, No Pcp Per PCP-Cardiologist:  No primary care provider on file.  HPI:    Remains intubated. Now on 80% Fio2. Awake but agitated seems to follow some coomands  Good urine output but creatinine getting worse and weight up again.   On milrinone 0.25. Off NE. Co-ox 66% BP stable   Remains on zosyn. Vanc stopped. Tmax 100.0  WBC up to 17k  Echo 04/24/19 EF 25-30% no LV thrombus   Objective:    Vital Signs:   Temp:  [98.6 F (37 C)-100 F (37.8 C)] 100 F (37.8 C) (09/03 0746) Pulse Rate:  [69-130] 87 (09/03 0756) Resp:  [12-37] 12 (09/03 0756) BP: (95-182)/(56-83) 182/58 (09/03 0756) SpO2:  [84 %-100 %] 93 % (09/03 0756) Arterial Line BP: (107-154)/(44-61) 131/51 (09/03 0600) FiO2 (%):  [80 %-100 %] 80 % (09/03 0757) Weight:  [98 kg] 98 kg (09/03 0657) Last BM Date: 04/26/19  Weight change: Filed Weights   04/24/19 0414 04/25/19 0400 04/26/19 0657  Weight: 91 kg 97.5 kg 98 kg    Intake/Output:   Intake/Output Summary (Last 24 hours) at 04/26/2019 0842 Last data filed at 04/26/2019 0600 Gross per 24 hour  Intake 2788.02 ml  Output 2610 ml  Net 178.02 ml      Physical Exam   General:  Intubated. Awake and agitated on vent  No resp difficulty HEENT: normal Neck: supple. JVP to jaw. LIJ TLC Carotids 2+ bilat; no bruits. No lymphadenopathy or thryomegaly appreciated. Cor: PMI nondisplaced. Regular rate & rhythm. No rubs, gallops or murmurs. Lungs: diffuse rhonhci tachypneic Abdomen: obese soft, nontender, nondistended. No hepatosplenomegaly. No bruits or masses. Good bowel sounds. Extremities: no cyanosis, clubbing, rash, 1-2+ edema Neuro: agitatetdon vent   Telemetry   NSR 80-100 Personally reviewed  Labs   Basic Metabolic Panel: Recent Labs  Lab 04/20/19 0452  04/22/19 0248 04/22/19 0347 04/23/19 0343 04/24/19  0335 04/25/19 0413 04/26/19 0445  NA 138   < > 141 142 142 142 148* 149*  K 3.7   < > 4.3 4.1 4.0 3.4* 3.4* 4.0  CL 107   < > 102  --  103 104 108 108  CO2 25   < > 27  --  '27 26 28 28  ' GLUCOSE 267*   < > 267*  --  297* 343* 133* 218*  BUN 51*   < > 64*  --  76* 88* 98* 117*  CREATININE 1.82*   < > 2.36*  --  2.65* 2.65* 3.01* 3.64*  CALCIUM 8.0*   < > 8.7*  --  8.5* 8.0* 7.9* 7.6*  MG 2.2  --  2.2  --  2.4 2.4 2.5*  --   PHOS  --   --  3.6  --  3.4 2.9 3.0  --    < > = values in this interval not displayed.    Liver Function Tests: Recent Labs  Lab 04/22/19 0248  AST 45*  ALT 48*  ALKPHOS 70  BILITOT 1.2  PROT 5.9*  ALBUMIN 2.3*   No results for input(s): LIPASE, AMYLASE in the last 168 hours. Recent Labs  Lab 04/23/19 0930  AMMONIA 40*    CBC: Recent Labs  Lab 04/22/19 0248 04/22/19 0347 04/23/19 0343 04/24/19 0335 04/25/19 0413 04/26/19 0445  WBC 21.0*  --  20.7* 19.4* 15.3* 17.1*  HGB 9.1* 8.8* 9.0* 8.8* 8.0* 8.2*  HCT 27.6* 26.0* 28.2* 27.5* 26.0* 26.6*  MCV 95.2  --  96.6 97.2 98.1 100.0  PLT 180  --  257 335 388 467*    Cardiac Enzymes: No results for input(s): CKTOTAL, CKMB, CKMBINDEX, TROPONINI in the last 168 hours.  BNP: BNP (last 3 results) No results for input(s): BNP in the last 8760 hours.  ProBNP (last 3 results) No results for input(s): PROBNP in the last 8760 hours.   CBG: Recent Labs  Lab 04/25/19 1515 04/25/19 1604 04/25/19 1949 04/25/19 2347 04/26/19 0335  GLUCAP 143* 122* 249* 237* 179*    Coagulation Studies: No results for input(s): LABPROT, INR in the last 72 hours.   Imaging   Dg Chest Port 1 View  Result Date: 04/26/2019 CLINICAL DATA:  Acute respiratory failure. Code STEMI, status post arrest. EXAM: PORTABLE CHEST 1 VIEW COMPARISON:  04/24/2019 FINDINGS: The endotracheal tube tip is above the carina. There is a NG tube with tip below GE junction. Heart size is normal. Small pleural effusions stable. Bilateral  interstitial and airspace opacities are identified unchanged from previous exam. IMPRESSION: 1. Stable support apparatus. 2. No change in bilateral interstitial and airspace opacities and small pleural effusions Electronically Signed   By: Kerby Moors M.D.   On: 04/26/2019 08:35     Medications:     Current Medications: .  stroke: mapping our early stages of recovery book   Does not apply Once  . sodium chloride   Intravenous Once  . aspirin  81 mg Oral Daily  . atorvastatin  80 mg Per Tube q1800  . chlorhexidine gluconate (MEDLINE KIT)  15 mL Mouth Rinse BID  . Chlorhexidine Gluconate Cloth  6 each Topical Daily  . enoxaparin (LOVENOX) injection  30 mg Subcutaneous Q24H  . famotidine  20 mg Per Tube Daily  . feeding supplement (PRO-STAT SUGAR FREE 64)  30 mL Per Tube BID  . insulin aspart  1-3 Units Subcutaneous Q4H  . insulin aspart  7 Units Subcutaneous Q4H  . insulin detemir  20 Units Subcutaneous Q12H  . mouth rinse  15 mL Mouth Rinse 10 times per day  . potassium chloride  40 mEq Oral Daily  . QUEtiapine  50 mg Oral BID  . sodium chloride flush  10 mL Intravenous Q12H    Infusions: . amiodarone 30 mg/hr (04/26/19 0619)  . dexmedetomidine 1.2 mcg/kg/hr (04/26/19 0600)  . dextrose    . feeding supplement (VITAL AF 1.2 CAL) 1,000 mL (04/26/19 0124)  . furosemide    . insulin Stopped (04/25/19 1605)  . milrinone 0.25 mcg/kg/min (04/26/19 0620)  . norepinephrine (LEVOPHED) Adult infusion Stopped (04/24/19 0408)  . piperacillin-tazobactam (ZOSYN)  IV 3.375 g (04/26/19 4235)    Assessment/Plan   1. CAD: Anterolateral STEMI, cath 8/24 showed occluded proximal LAD, unable to open.  Once we see how he recovers neurologically, will need to decide on feasibility of re-attempt PCI versus CABG.  - Continue ASA + statin.   2. Cardiogenic shock: Ischemic cardiomyopathy.   - Echo with EF 25%, normal RV.  Impella removed 8/28.  Now remains on milrinone 0.25 o - Co-ox 63%, -  Continue milrinone 0.25 for now. Off NE - No ACE/ARB?ARNI or b-blocker yet with AKI and shock  3. Acute hypoxic respiratory failure - CCM following.  - Suspect combination of HF and possible aspiration PNA. PCT 1.08. - Oxygenation getting worse. Now on 80% FiO2 and high peep - Creatinine  worse but still making decent urine - CVP 18 - CXR still with diffuse infiltrates - Will almost certainly need HD vs CVVHD - Once respiratory status improves will need trach   4. AKI:  -Suspect hemodynamic (ATN)   - Follow Creatinine 1.8 => 2 => 2.36=>2.6 => 2.65 => 3.01=> 3.64 - Suspect he may need CVVHD soon. Consult renal. CCM to place HD cath - Will diuresis with IV lasix  5. Cardiac arrest/VF: 20 minutes downtime, concerned for anoxic brain injury.  Neurology following, per yesterday's note he seems to have intact brainstem function but minimal cortical function. EEG suggestive of diffuse encephalopathy. CT head without definite evidence for anoxic encephalopathy.  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 Situation now complicated by worsening renal and respiratory function  - Neuro status much improving - small CVA on MRI. Echo on 9/1 with Definity -> no LV thrombus.Hold off on systemic AC at this time  6. ID: Continues with low grade fevers - ? Aspiration PNA and sinus disease (seen on CT) - Continue zosyn. Cultures negative so far - Vanc completed - Consider fungal coverage  7. Anemia:  - Transfuse as needed. Keep hgb >= 7.5. Hgb 8.2 today   8. Elevated LFTs: Suspect shock liver, improving.   9. DVT prophylaxis: Lovenox.    10. Urinary retention - Foley  11. Hypernatremia. - May need free H2O down tube  D/w CCM at bedside.   CRITICAL CARE Performed by: Glori Bickers  Total critical care time: 45 minutes  Critical  care time was exclusive of separately billable procedures and treating other patients.  Critical care was necessary to treat or prevent imminent or life-threatening deterioration.  Critical care was time spent personally by me (independent of midlevel providers or residents) on the following activities: development of treatment plan with patient and/or surrogate as well as nursing, discussions with consultants, evaluation of patient's response to treatment, examination of patient, obtaining history from patient or surrogate, ordering and performing treatments and interventions, ordering and review of laboratory studies, ordering and review of radiographic studies, pulse oximetry and re-evaluation of patient's condition.   Glori Bickers MD 04/26/2019 8:42 AM

## 2019-04-26 NOTE — Consult Note (Signed)
[facility] Nephrology Consultation Note  Requesting MD: Dr. Haroldine Laws Reason for consult: AKI and fluid overload.  HPI:  Lawrence Olson is a 68 y.o. male who was admitted on 8/24 after cardiac arrest with CPR and intubation with downtime 20 minutes, found to have anterolateral STEMI and cardiac cath with occluded proximal LAD which was unable to open.  Patient developed cardiogenic shock with EF of 25%, normal RV.  Impella was placed and later removed.  On admission the serum creatinine level was 1.45.  He was treated with IV diuretics for CHF exacerbation and the creatinine level continued to climb to 3.64 associated with a BUN 17.  He still has good urine output and responding with diuretics however causing worsening renal failure.  He is now 100% FiO2 with PEEP 15 and exam consistent with fluid overload.  We are consulted to evaluate for possible need for renal replacement therapy.  He is receiving milrinone, amiodarone, Zosyn, propofol and a dose of IV Lasix 120 mg this morning.  Blood pressure is acceptable however with worsening respiratory status.  Unable to obtain review of system from the patient.  I have discussed with the patient's daughter at bedside.  PCCM is following.   PMHx:  History reviewed. No pertinent past medical history.  Reviewed, no significant past medical history.  Past Surgical History:  Procedure Laterality Date  . RIGHT HEART CATH AND CORONARY ANGIOGRAPHY N/A 04/16/2019   Procedure: RIGHT HEART CATH AND CORONARY ANGIOGRAPHY;  Surgeon: Troy Sine, MD;  Location: [facility];  Service: Cardiovascular;  Laterality: N/A;  . VENTRICULAR ASSIST DEVICE INSERTION N/A 04/16/2019   Procedure: VENTRICULAR ASSIST DEVICE INSERTION;  Surgeon: Troy Sine, MD;  Location: [facility];  Service: Cardiovascular;  Laterality: N/A;    Family Hx: History reviewed. No pertinent family history.  Reviewed,  Social History:  reports that he has quit  smoking. He has never used smokeless tobacco. He reports current alcohol use. No history on file for drug.  Allergies: No Known Allergies  Medications: Prior to Admission medications   Medication Sig Start Date End Date Taking? Authorizing Provider  fluocinolone (SYNALAR) 0.01 % external solution Apply 1 application topically 2 (two) times daily as needed (skin care).  03/22/19  Yes [provider]  ketoconazole (NIZORAL) 2 % shampoo Apply 5 mLs topically daily as needed for irritation.  03/17/19  Yes [provider]    I have reviewed the patient's current medications.  Labs:  Results for orders placed or performed during the hospital encounter of 04/16/19 (from the past 48 hour(s))  Glucose, capillary     Status: Abnormal   Collection Time: 04/24/19 11:55 AM  Result Value Ref Range   Glucose-Capillary 363 (H) 70 - 99 mg/dL  Glucose, capillary     Status: Abnormal   Collection Time: 04/24/19 12:25 PM  Result Value Ref Range   Glucose-Capillary 140 (H) 70 - 99 mg/dL  Glucose, capillary     Status: Abnormal   Collection Time: 04/24/19  1:23 PM  Result Value Ref Range   Glucose-Capillary 365 (H) 70 - 99 mg/dL  Glucose, capillary     Status: Abnormal   Collection Time: 04/24/19  1:25 PM  Result Value Ref Range   Glucose-Capillary 381 (H) 70 - 99 mg/dL  Glucose, capillary     Status: Abnormal   Collection Time: 04/24/19  2:31 PM  Result Value Ref Range   Glucose-Capillary 356 (H) 70 - 99 mg/dL  Glucose,  capillary     Status: Abnormal   Collection Time: 04/24/19  3:42 PM  Result Value Ref Range   Glucose-Capillary 302 (H) 70 - 99 mg/dL  Glucose, capillary     Status: Abnormal   Collection Time: 04/24/19  4:41 PM  Result Value Ref Range   Glucose-Capillary 331 (H) 70 - 99 mg/dL  Glucose, capillary     Status: Abnormal   Collection Time: 04/24/19  5:44 PM  Result Value Ref Range   Glucose-Capillary 278 (H) 70 - 99 mg/dL  Glucose, capillary     Status: Abnormal    Collection Time: 04/24/19  6:47 PM  Result Value Ref Range   Glucose-Capillary 250 (H) 70 - 99 mg/dL  Glucose, capillary     Status: Abnormal   Collection Time: 04/24/19  8:00 PM  Result Value Ref Range   Glucose-Capillary 232 (H) 70 - 99 mg/dL  Glucose, capillary     Status: Abnormal   Collection Time: 04/24/19  9:07 PM  Result Value Ref Range   Glucose-Capillary 203 (H) 70 - 99 mg/dL  Glucose, capillary     Status: Abnormal   Collection Time: 04/24/19 10:15 PM  Result Value Ref Range   Glucose-Capillary 200 (H) 70 - 99 mg/dL  Glucose, capillary     Status: Abnormal   Collection Time: 04/24/19 11:07 PM  Result Value Ref Range   Glucose-Capillary 166 (H) 70 - 99 mg/dL  Glucose, capillary     Status: Abnormal   Collection Time: 04/25/19 12:00 AM  Result Value Ref Range   Glucose-Capillary 190 (H) 70 - 99 mg/dL  Glucose, capillary     Status: Abnormal   Collection Time: 04/25/19  1:09 AM  Result Value Ref Range   Glucose-Capillary 158 (H) 70 - 99 mg/dL  Glucose, capillary     Status: Abnormal   Collection Time: 04/25/19  2:13 AM  Result Value Ref Range   Glucose-Capillary 138 (H) 70 - 99 mg/dL  Glucose, capillary     Status: Abnormal   Collection Time: 04/25/19  3:16 AM  Result Value Ref Range   Glucose-Capillary 114 (H) 70 - 99 mg/dL  .Cooxemetry Panel (carboxy, met, total hgb, O2 sat)     Status: Abnormal   Collection Time: 04/25/19  4:05 AM  Result Value Ref Range   Total hemoglobin 8.5 (L) 12.0 - 16.0 g/dL   O2 Saturation 66.5 %   Carboxyhemoglobin 1.7 (H) 0.5 - 1.5 %   Methemoglobin 1.3 0.0 - 1.5 %  Glucose, capillary     Status: Abnormal   Collection Time: 04/25/19  4:11 AM  Result Value Ref Range   Glucose-Capillary 131 (H) 70 - 99 mg/dL  Lactate dehydrogenase     Status: Abnormal   Collection Time: 04/25/19  4:13 AM  Result Value Ref Range   LDH 787 (H) 98 - 192 U/L    Comment: Performed at [facility], [place] [address], [place], [place] [address]  CBC      Status: Abnormal   Collection Time: 04/25/19  4:13 AM  Result Value Ref Range   WBC 15.3 (H) 4.0 - 10.5 K/uL   RBC 2.65 (L) 4.22 - 5.81 MIL/uL   Hemoglobin 8.0 (L) 13.0 - 17.0 g/dL   HCT 26.0 (L) 39.0 - 52.0 %   MCV 98.1 80.0 - 100.0 fL   MCH 30.2 26.0 - 34.0 pg   MCHC 30.8 30.0 - 36.0 g/dL   RDW 15.1 11.5 - 15.5 %  Platelets 388 150 - 400 K/uL   nRBC 0.4 (H) 0.0 - 0.2 %    Comment: Performed at [facility], [place] [address] [place], [place] [address]  Basic metabolic panel     Status: Abnormal   Collection Time: 04/25/19  4:13 AM  Result Value Ref Range   Sodium 148 (H) 135 - 145 mmol/L   Potassium 3.4 (L) 3.5 - 5.1 mmol/L   Chloride 108 98 - 111 mmol/L   CO2 28 22 - 32 mmol/L   Glucose, Bld 133 (H) 70 - 99 mg/dL   BUN 98 (H) 8 - 23 mg/dL   Creatinine, Ser 3.01 (H) 0.61 - 1.24 mg/dL   Calcium 7.9 (L) 8.9 - 10.3 mg/dL   GFR calc non Af Amer 20 (L) >60 mL/min   GFR calc Af Amer 24 (L) >60 mL/min   Anion gap 12 5 - 15    Comment: Performed at [facility] [address], [place], [place] [address]  Magnesium     Status: Abnormal   Collection Time: 04/25/19  4:13 AM  Result Value Ref Range   Magnesium 2.5 (H) 1.7 - 2.4 mg/dL    Comment: Performed at [facility] [address] [place],  [address]  Phosphorus     Status: None   Collection Time: 04/25/19  4:13 AM  Result Value Ref Range   Phosphorus 3.0 2.5 - 4.6 mg/dL    Comment: Performed at [facility] [address] [place], [place] [address]  Glucose, capillary     Status: Abnormal   Collection Time: 04/25/19  5:38 AM  Result Value Ref Range   Glucose-Capillary 129 (H) 70 - 99 mg/dL  Glucose, capillary     Status: Abnormal   Collection Time: 04/25/19  6:56 AM  Result Value Ref Range   Glucose-Capillary 179 (H) 70 - 99 mg/dL  Glucose, capillary     Status: Abnormal   Collection Time: 04/25/19  8:01 AM  Result Value Ref Range   Glucose-Capillary 172 (H) 70 - 99 mg/dL  Glucose,  capillary     Status: Abnormal   Collection Time: 04/25/19  9:37 AM  Result Value Ref Range   Glucose-Capillary 181 (H) 70 - 99 mg/dL  Glucose, capillary     Status: Abnormal   Collection Time: 04/25/19 11:38 AM  Result Value Ref Range   Glucose-Capillary 153 (H) 70 - 99 mg/dL  Glucose, capillary     Status: Abnormal   Collection Time: 04/25/19 12:46 PM  Result Value Ref Range   Glucose-Capillary 129 (H) 70 - 99 mg/dL  Glucose, capillary     Status: Abnormal   Collection Time: 04/25/19  1:53 PM  Result Value Ref Range   Glucose-Capillary 151 (H) 70 - 99 mg/dL  Glucose, capillary     Status: Abnormal   Collection Time: 04/25/19  3:15 PM  Result Value Ref Range   Glucose-Capillary 143 (H) 70 - 99 mg/dL  Glucose, capillary     Status: Abnormal   Collection Time: 04/25/19  4:04 PM  Result Value Ref Range   Glucose-Capillary 122 (H) 70 - 99 mg/dL  Glucose, capillary     Status: Abnormal   Collection Time: 04/25/19  7:49 PM  Result Value Ref Range   Glucose-Capillary 249 (H) 70 - 99 mg/dL  Glucose, capillary     Status: Abnormal   Collection Time: 04/25/19 11:47 PM  Result Value Ref Range   Glucose-Capillary 237 (  H) 70 - 99 mg/dL  Glucose, capillary     Status: Abnormal   Collection Time: 04/26/19  3:35 AM  Result Value Ref Range   Glucose-Capillary 179 (H) 70 - 99 mg/dL  Lactate dehydrogenase     Status: Abnormal   Collection Time: 04/26/19  4:45 AM  Result Value Ref Range   LDH 594 (H) 98 - 192 U/L    Comment: Performed at [facility], [place] [address], [place], [place] [address]  CBC     Status: Abnormal   Collection Time: 04/26/19  4:45 AM  Result Value Ref Range   WBC 17.1 (H) 4.0 - 10.5 K/uL   RBC 2.66 (L) 4.22 - 5.81 MIL/uL   Hemoglobin 8.2 (L) 13.0 - 17.0 g/dL   HCT 26.6 (L) 39.0 - 52.0 %   MCV 100.0 80.0 - 100.0 fL   MCH 30.8 26.0 - 34.0 pg   MCHC 30.8 30.0 - 36.0 g/dL   RDW 15.6 (H) 11.5 - 15.5 %   Platelets 467 (H) 150 - 400 K/uL   nRBC 0.2 0.0 - 0.2 %     Comment: Performed at [facility] [address] [place], [place] [address]  Basic metabolic panel     Status: Abnormal   Collection Time: 04/26/19  4:45 AM  Result Value Ref Range   Sodium 149 (H) 135 - 145 mmol/L   Potassium 4.0 3.5 - 5.1 mmol/L   Chloride 108 98 - 111 mmol/L   CO2 28 22 - 32 mmol/L   Glucose, Bld 218 (H) 70 - 99 mg/dL   BUN 117 (H) 8 - 23 mg/dL   Creatinine, Ser 3.64 (H) 0.61 - 1.24 mg/dL   Calcium 7.6 (L) 8.9 - 10.3 mg/dL   GFR calc non Af Amer 16 (L) >60 mL/min   GFR calc Af Amer 19 (L) >60 mL/min   Anion gap 13 5 - 15    Comment: Performed at [facility] [address] [place], [place] [address]  .Cooxemetry Panel (carboxy, met, total hgb, O2 sat)     Status: Abnormal   Collection Time: 04/26/19  4:55 AM  Result Value Ref Range   Total hemoglobin 7.6 (L) 12.0 - 16.0 g/dL   O2 Saturation 66.4 %   Carboxyhemoglobin 1.3 0.5 - 1.5 %   Methemoglobin 0.8 0.0 - 1.5 %     ROS: Unable to obtain review of system as patient is sedated.  Physical Exam: Vitals:   04/26/19 1106 04/26/19 1129  BP: (!) 117/55   Pulse: (!) 114   Resp: (!) 33   Temp:  99.9 F (37.7 C)  SpO2: 95%      General exam: Sedated and intubated.  Not in distress Respiratory system: Coarse breath sound bilateral, on ventilator. Cardiovascular system: S1 & S2 heard, RRR.  Bilateral lower extremity edema up to thigh Gastrointestinal system: Abdomen is soft and nondistended. Normal bowel sounds heard. Central nervous system: Sedated. Extremities: Has edema, no cyanosis or clubbing Skin: No rashes, lesions or ulcers Psychiatry: Unable to assess.  Assessment/Plan:  #Acute kidney injury likely ATN in the setting of cardiac arrest and cardiogenic shock concomitant with IV contrast and overdiuresis: -Patient was treated with IV diuretics with good urine output however causing worsening renal failure and hypernatremia.  Patient still has fluid overload with high oxygen  requirement on vent.  I think higher dose of Lasix will further deteriorate the kidney function and cause uremia.  We will  start CRRT today with 4K, UF goal 100-150 cc an hour depending on blood pressure, IV heparin for anticoagulation.  Discussed with ICU team for the catheter placement. -Check urinalysis, urine lites and kidney ultrasound. -I have discussed with the patient's daughter at bedside regarding worsening kidney function and plan for dialysis.  She agreed.  #Cardiac arrest, CAD with anterolateral STEMI: Occluded proximal LAD unable to open.  Cardiology is following.  #Cardiogenic shock: EF 25%, Impella removed on 8/28.  Currently on milrinone.    #Acute respiratory failure with hypoxia: On vent.  FiO2 100% with high PEEP.  CVP elevated.  Per PCCM.  #Anemia: Transfuse as needed.  Thank you for the consult.  Will follow with you.   Anaia Frith Tanna Furry 04/26/2019, 11:36 AM  [facility].

## 2019-04-26 NOTE — Progress Notes (Signed)
RN decreased sedation. However, patient's respirations increased to 45. PRN versed given. See MAR.

## 2019-04-27 ENCOUNTER — Inpatient Hospital Stay (HOSPITAL_COMMUNITY): Payer: PPO

## 2019-04-27 DIAGNOSIS — Z978 Presence of other specified devices: Secondary | ICD-10-CM

## 2019-04-27 DIAGNOSIS — D649 Anemia, unspecified: Secondary | ICD-10-CM

## 2019-04-27 LAB — RENAL FUNCTION PANEL
Albumin: 1.7 g/dL — ABNORMAL LOW (ref 3.5–5.0)
Albumin: 1.9 g/dL — ABNORMAL LOW (ref 3.5–5.0)
Anion gap: 9 (ref 5–15)
Anion gap: 11 (ref 5–15)
BUN: 88 mg/dL — ABNORMAL HIGH (ref 8–23)
BUN: 61 mg/dL — ABNORMAL HIGH (ref 8–23)
CO2: 26 mmol/L (ref 22–32)
CO2: 25 mmol/L (ref 22–32)
Calcium: 7.6 mg/dL — ABNORMAL LOW (ref 8.9–10.3)
Calcium: 7.8 mg/dL — ABNORMAL LOW (ref 8.9–10.3)
Chloride: 110 mmol/L (ref 98–111)
Chloride: 106 mmol/L (ref 98–111)
Creatinine, Ser: 2.8 mg/dL — ABNORMAL HIGH (ref 0.61–1.24)
Creatinine, Ser: 2.14 mg/dL — ABNORMAL HIGH (ref 0.61–1.24)
GFR calc Af Amer: 26 mL/min — ABNORMAL LOW (ref >60)
GFR calc Af Amer: 36 mL/min — ABNORMAL LOW (ref >60)
GFR calc non Af Amer: 22 mL/min — ABNORMAL LOW (ref >60)
GFR calc non Af Amer: 31 mL/min — ABNORMAL LOW (ref >60)
Glucose, Bld: 195 mg/dL — ABNORMAL HIGH (ref 70–99)
Glucose, Bld: 140 mg/dL — ABNORMAL HIGH (ref 70–99)
Phosphorus: 3.7 mg/dL (ref 2.5–4.6)
Phosphorus: 3.3 mg/dL (ref 2.5–4.6)
Potassium: 3.9 mmol/L (ref 3.5–5.1)
Potassium: 4.1 mmol/L (ref 3.5–5.1)
Sodium: 145 mmol/L (ref 135–145)
Sodium: 142 mmol/L (ref 135–145)

## 2019-04-27 LAB — POCT I-STAT 7, (LYTES, BLD GAS, ICA,H+H)
Acid-Base Excess: 3 mmol/L — ABNORMAL HIGH (ref 0.0–2.0)
Acid-Base Excess: 1 mmol/L (ref 0.0–2.0)
Acid-Base Excess: 2 mmol/L (ref 0.0–2.0)
Acid-Base Excess: 3 mmol/L — ABNORMAL HIGH (ref 0.0–2.0)
Acid-Base Excess: 4 mmol/L — ABNORMAL HIGH (ref 0.0–2.0)
Bicarbonate: 26.6 mmol/L (ref 20.0–28.0)
Bicarbonate: 25.8 mmol/L (ref 20.0–28.0)
Bicarbonate: 27.7 mmol/L (ref 20.0–28.0)
Bicarbonate: 28.8 mmol/L — ABNORMAL HIGH (ref 20.0–28.0)
Bicarbonate: 27.9 mmol/L (ref 20.0–28.0)
Calcium, Ion: 1.12 mmol/L — ABNORMAL LOW (ref 1.15–1.40)
Calcium, Ion: 1.1 mmol/L — ABNORMAL LOW (ref 1.15–1.40)
Calcium, Ion: 1.07 mmol/L — ABNORMAL LOW (ref 1.15–1.40)
Calcium, Ion: 1.09 mmol/L — ABNORMAL LOW (ref 1.15–1.40)
Calcium, Ion: 1.04 mmol/L — ABNORMAL LOW (ref 1.15–1.40)
HCT: 29 % — ABNORMAL LOW (ref 39.0–52.0)
HCT: 30 % — ABNORMAL LOW (ref 39.0–52.0)
HCT: 27 % — ABNORMAL LOW (ref 39.0–52.0)
HCT: 24 % — ABNORMAL LOW (ref 39.0–52.0)
HCT: 26 % — ABNORMAL LOW (ref 39.0–52.0)
Hemoglobin: 9.9 g/dL — ABNORMAL LOW (ref 13.0–17.0)
Hemoglobin: 10.2 g/dL — ABNORMAL LOW (ref 13.0–17.0)
Hemoglobin: 9.2 g/dL — ABNORMAL LOW (ref 13.0–17.0)
Hemoglobin: 8.2 g/dL — ABNORMAL LOW (ref 13.0–17.0)
Hemoglobin: 8.8 g/dL — ABNORMAL LOW (ref 13.0–17.0)
O2 Saturation: 82 %
O2 Saturation: 73 %
O2 Saturation: 98 %
O2 Saturation: 100 %
O2 Saturation: 90 %
Patient temperature: 100.6
Patient temperature: 100
Patient temperature: 99.9
Patient temperature: 98.6
Patient temperature: 36.8
Potassium: 3.5 mmol/L (ref 3.5–5.1)
Potassium: 3.8 mmol/L (ref 3.5–5.1)
Potassium: 4.5 mmol/L (ref 3.5–5.1)
Potassium: 4.1 mmol/L (ref 3.5–5.1)
Potassium: 4.9 mmol/L (ref 3.5–5.1)
Sodium: 150 mmol/L — ABNORMAL HIGH (ref 135–145)
Sodium: 152 mmol/L — ABNORMAL HIGH (ref 135–145)
Sodium: 151 mmol/L — ABNORMAL HIGH (ref 135–145)
Sodium: 152 mmol/L — ABNORMAL HIGH (ref 135–145)
Sodium: 145 mmol/L (ref 135–145)
TCO2: 28 mmol/L (ref 22–32)
TCO2: 27 mmol/L (ref 22–32)
TCO2: 29 mmol/L (ref 22–32)
TCO2: 30 mmol/L (ref 22–32)
TCO2: 29 mmol/L (ref 22–32)
pCO2 arterial: 39.2 mmHg (ref 32.0–48.0)
pCO2 arterial: 43.2 mmHg (ref 32.0–48.0)
pCO2 arterial: 46.7 mmHg (ref 32.0–48.0)
pCO2 arterial: 50.1 mmHg — ABNORMAL HIGH (ref 32.0–48.0)
pCO2 arterial: 35.7 mmHg (ref 32.0–48.0)
pH, Arterial: 7.445 (ref 7.350–7.450)
pH, Arterial: 7.387 (ref 7.350–7.450)
pH, Arterial: 7.384 (ref 7.350–7.450)
pH, Arterial: 7.368 (ref 7.350–7.450)
pH, Arterial: 7.5 — ABNORMAL HIGH (ref 7.350–7.450)
pO2, Arterial: 47 mmHg — ABNORMAL LOW (ref 83.0–108.0)
pO2, Arterial: 41 mmHg — ABNORMAL LOW (ref 83.0–108.0)
pO2, Arterial: 110 mmHg — ABNORMAL HIGH (ref 83.0–108.0)
pO2, Arterial: 230 mmHg — ABNORMAL HIGH (ref 83.0–108.0)
pO2, Arterial: 53 mmHg — ABNORMAL LOW (ref 83.0–108.0)

## 2019-04-27 LAB — CBC
HCT: 27.1 % — ABNORMAL LOW (ref 39.0–52.0)
Hemoglobin: 8.2 g/dL — ABNORMAL LOW (ref 13.0–17.0)
MCH: 30.1 pg (ref 26.0–34.0)
MCHC: 30.3 g/dL (ref 30.0–36.0)
MCV: 99.6 fL (ref 80.0–100.0)
Platelets: 519 10*3/uL — ABNORMAL HIGH (ref 150–400)
RBC: 2.72 MIL/uL — ABNORMAL LOW (ref 4.22–5.81)
RDW: 15.9 % — ABNORMAL HIGH (ref 11.5–15.5)
WBC: 22.8 10*3/uL — ABNORMAL HIGH (ref 4.0–10.5)
nRBC: 0.1 % (ref 0.0–0.2)

## 2019-04-27 LAB — POCT ACTIVATED CLOTTING TIME
Activated Clotting Time: 136 seconds
Activated Clotting Time: 147 seconds
Activated Clotting Time: 153 seconds
Activated Clotting Time: 153 seconds
Activated Clotting Time: 153 seconds
Activated Clotting Time: 153 seconds
Activated Clotting Time: 153 seconds
Activated Clotting Time: 153 seconds
Activated Clotting Time: 153 seconds
Activated Clotting Time: 158 seconds
Activated Clotting Time: 158 seconds
Activated Clotting Time: 158 seconds
Activated Clotting Time: 158 seconds
Activated Clotting Time: 158 seconds
Activated Clotting Time: 169 seconds
Activated Clotting Time: 175 seconds
Activated Clotting Time: 180 seconds
Activated Clotting Time: 180 seconds
Activated Clotting Time: 186 seconds
Activated Clotting Time: 186 seconds
Activated Clotting Time: 191 seconds
Activated Clotting Time: 191 seconds
Activated Clotting Time: 197 seconds
Activated Clotting Time: 197 seconds
Activated Clotting Time: 208 seconds

## 2019-04-27 LAB — COOXEMETRY PANEL
Carboxyhemoglobin: 1.7 % — ABNORMAL HIGH (ref 0.5–1.5)
Methemoglobin: 1.3 % (ref 0.0–1.5)
O2 Saturation: 68.8 %
Total hemoglobin: 8.5 g/dL — ABNORMAL LOW (ref 12.0–16.0)

## 2019-04-27 LAB — PROTIME-INR
INR: 1.2 (ref 0.8–1.2)
Prothrombin Time: 15.2 seconds (ref 11.4–15.2)

## 2019-04-27 LAB — APTT: aPTT: 52 seconds — ABNORMAL HIGH (ref 24–36)

## 2019-04-27 LAB — GLUCOSE, CAPILLARY
Glucose-Capillary: 131 mg/dL — ABNORMAL HIGH (ref 70–99)
Glucose-Capillary: 150 mg/dL — ABNORMAL HIGH (ref 70–99)
Glucose-Capillary: 167 mg/dL — ABNORMAL HIGH (ref 70–99)
Glucose-Capillary: 175 mg/dL — ABNORMAL HIGH (ref 70–99)
Glucose-Capillary: 208 mg/dL — ABNORMAL HIGH (ref 70–99)

## 2019-04-27 LAB — LACTATE DEHYDROGENASE: LDH: 458 U/L — ABNORMAL HIGH (ref 98–192)

## 2019-04-27 LAB — MAGNESIUM: Magnesium: 2.7 mg/dL — ABNORMAL HIGH (ref 1.7–2.4)

## 2019-04-27 MED ORDER — SODIUM CHLORIDE 0.9 % IV SOLN
INTRAVENOUS | Status: DC | PRN
  Administered 2019-04-27 – 2019-04-30 (×2): via INTRAVENOUS

## 2019-04-27 NOTE — Progress Notes (Signed)
Patient ID: Nichael Ehly, male   DOB: 1951-01-28, 68 y.o.   [name] [id number]   Advanced Heart Failure Team  Note   Primary Physician: Patient, No Pcp Per PCP-Cardiologist:  No primary care provider on file.  HPI:    CVVHD started yesterday with good fluid removal. However weight up 2 pounds. (? Accurate)   Vent down to 40%. CXR mildly improved. BP stable. On milrinone 0.25. Off NE. Co-ox 69%   Sedated on propofol.  Remains on zosyn. Vanc stopped. Tmax 99.2  WBC up to 17k -> 22.8k  Echo 04/24/19 EF 25-30% no LV thrombus   Objective:    Vital Signs:   Temp:  [97.8 F (36.6 C)-99.2 F (37.3 C)] 98.1 F (36.7 C) (09/04 1100) Pulse Rate:  [75-118] 93 (09/04 1100) Resp:  [12-31] 24 (09/04 1100) BP: (91-140)/(53-79) 115/63 (09/04 1137) SpO2:  [89 %-100 %] 95 % (09/04 1137) Arterial Line BP: (96-151)/(45-69) 132/47 (09/04 1100) FiO2 (%):  [40 %-100 %] 40 % (09/04 1137) Last BM Date: 04/27/19  Weight change: Filed Weights   04/24/19 0414 04/25/19 0400 04/26/19 0657  Weight: 91 kg 97.5 kg 98 kg    Intake/Output:   Intake/Output Summary (Last 24 hours) at 04/27/2019 1159 Last data filed at 04/27/2019 1100 Gross per 24 hour  Intake 3068.51 ml  Output 4197 ml  Net -1128.49 ml      Physical Exam   General:  Intubated/sedated HEENT: normal Neck: supple. RIJ trialyids. LIC TLCCarotids 2+ bilat; no bruits. No lymphadenopathy or thryomegaly appreciated. Cor: PMI nondisplaced. Regular rate & rhythm. No rubs, gallops or murmurs. Lungs: coars Abdomen: obese soft, nontender, nondistended. No hepatosplenomegaly. No bruits or masses. Good bowel sounds. Extremities: no cyanosis, clubbing, rash, 2+ edema Neuro:intubated sedated    Telemetry   NSR 90s Personally reviewed  Labs   Basic Metabolic Panel: Recent Labs  Lab 04/22/19 0248  04/23/19 0343 04/24/19 0335 04/25/19 0413 04/26/19 0445 04/26/19 1600 04/27/19 0240  NA 141   < > 142 142 148* 149* 151* 145  K 4.3   < >  4.0 3.4* 3.4* 4.0 4.1 3.9  CL 102  --  103 104 108 108 111 110  CO2 27  --  _0 GLUCOSE 267*  --  297* 343* 133* 218* 169* 195*  BUN 64*  --  76* 88* 98* 117* 123* 88*  CREATININE 2.36*  --  2.65* 2.65* 3.01* 3.64* 3.91* 2.80*  CALCIUM 8.7*  --  8.5* 8.0* 7.9* 7.6* 7.6* 7.6*  MG 2.2  --  2.4 2.4 2.5*  --   --  2.7*  PHOS 3.6  --  3.4 2.9 3.0  --  4.5 3.7   < > = values in this interval not displayed.    Liver Function Tests: Recent Labs  Lab 04/22/19 0248 04/26/19 1600 04/27/19 0240  AST 45*  --   --   ALT 48*  --   --   ALKPHOS 70  --   --   BILITOT 1.2  --   --   PROT 5.9*  --   --   ALBUMIN 2.3* 1.7* 1.7*   No results for input(s): LIPASE, AMYLASE in the last 168 hours. Recent Labs  Lab 04/23/19 0930  AMMONIA 40*    CBC: Recent Labs  Lab 04/23/19 0343 04/24/19 0335 04/25/19 0413 04/26/19 0445 04/27/19 0240  WBC 20.7* 19.4* 15.3* 17.1* 22.8*  HGB 9.0* 8.8* 8.0* 8.2* 8.2*  HCT 28.2* 27.5*  26.0* 26.6* 27.1*  MCV 96.6 97.2 98.1 100.0 99.6  PLT 257 335 388 467* 519*    Cardiac Enzymes: No results for input(s): CKTOTAL, CKMB, CKMBINDEX, TROPONINI in the last 168 hours.  BNP: BNP (last 3 results) No results for input(s): BNP in the last 8760 hours.  ProBNP (last 3 results) No results for input(s): PROBNP in the last 8760 hours.   CBG: Recent Labs  Lab 04/26/19 1608 04/26/19 2013 04/27/19 0004 04/27/19 0348 04/27/19 0808  GLUCAP 169* 133* 208* 175* 150*    Coagulation Studies: Recent Labs    04/27/19 0240  LABPROT 15.2  INR 1.2     Imaging   US Renal  Result Date: 04/26/2019 CLINICAL DATA:  68 year old male with acute renal insufficiency. EXAM: RENAL / URINARY TRACT ULTRASOUND COMPLETE COMPARISON:  None. FINDINGS: Right Kidney: Renal measurements: 14.3 x 6.0 x 6.5 cm = volume: 293 mL. Normal echogenicity. No hydronephrosis or shadowing stone. Left Kidney: Renal measurements: 14.4 x 6.5 x 6.1 cm = volume: 299 mL. Normal  echogenicity. No hydronephrosis or shadowing stone. Bladder: The urinary bladder is decompressed around a Foley catheter. The liver is echogenic most commonly seen in the setting of fatty liver. There is a small perihepatic free fluid. Partially visualized left pleural effusion. IMPRESSION: 1. No hydronephrosis or shadowing stone. 2. Fatty liver. 3. Left pleural effusion and small perihepatic free fluid. Electronically Signed   By: Anner Crete M.D.   On: 04/26/2019 12:54   Dg Chest Port 1 View  Result Date: 04/27/2019 CLINICAL DATA:  Respiratory failure EXAM: PORTABLE CHEST 1 VIEW COMPARISON:  04/26/2019 FINDINGS: Cardiac shadow is stable. Jugular catheters are noted bilaterally. Endotracheal tube and gastric catheter are again seen. Diffuse bilateral airspace opacities are noted relatively stable from the prior exam. No new pneumothorax or effusion is seen. IMPRESSION: Stable bilateral infiltrates. Tubes and lines as described stable from the prior exam. Electronically Signed   By: Inez Catalina M.D.   On: 04/27/2019 06:55   Dg Chest Port 1 View  Result Date: 04/26/2019 CLINICAL DATA:  Status post central line placement today. EXAM: PORTABLE CHEST 1 VIEW COMPARISON:  Single-view of the chest earlier today. FINDINGS: New right IJ central venous catheter tip projects in the mid to lower superior vena cava. Endotracheal tube is in good position well above the carina. The patient's NG tube has backed out with its tip now in the distal esophagus. The tube should be advanced 13-14 cm for better positioning. Extensive bilateral airspace disease is worse on the right and unchanged. No pneumothorax or pleural fluid. Heart size is normal. IMPRESSION: Right IJ catheter tip projects in the mid to lower superior vena cava. No pneumothorax. NG tube has backed out with its tip now in the distal esophagus. Recommend advancement of 13-14 cm. No change in extensive bilateral airspace disease, worse on the right.  Electronically Signed   By: Inge Rise M.D.   On: 04/26/2019 14:24     Medications:     Current Medications:   stroke: mapping our early stages of recovery book   Does not apply Once   sodium chloride   Intravenous Once   aspirin  81 mg Oral Daily   atorvastatin  80 mg Per Tube q1800   B-complex with vitamin C  1 tablet Oral Daily   chlorhexidine gluconate (MEDLINE KIT)  15 mL Mouth Rinse BID   Chlorhexidine Gluconate Cloth  6 each Topical Daily   clonazePAM  0.5 mg Per Tube BID  famotidine  20 mg Per Tube Daily   feeding supplement (PRO-STAT SUGAR FREE 64)  30 mL Per Tube BID   heparin injection (subcutaneous)  5,000 Units Subcutaneous Q8H   insulin aspart  1-3 Units Subcutaneous Q4H   insulin aspart  7 Units Subcutaneous Q4H   insulin detemir  25 Units Subcutaneous Q12H   mouth rinse  15 mL Mouth Rinse 10 times per day   potassium chloride  40 mEq Oral Daily   QUEtiapine  100 mg Oral BID   sodium chloride flush  10 mL Intravenous Q12H    Infusions:   prismasol BGK 4/2.5 500 mL/hr at 04/27/19 0215    prismasol BGK 4/2.5 300 mL/hr at 04/27/19 0906   sodium chloride 10 mL/hr at 04/27/19 1100   dextrose     feeding supplement (VITAL AF 1.2 CAL) 1,000 mL (04/26/19 2226)   heparin 10,000 units/ 20 mL infusion syringe 2,150 Units/hr (04/27/19 1056)   milrinone 0.25 mcg/kg/min (04/27/19 1100)   piperacillin-tazobactam Stopped (04/27/19 0830)   prismasol BGK 4/2.5 1,500 mL/hr at 04/27/19 0856   propofol (DIPRIVAN) infusion 15 mcg/kg/min (04/27/19 1100)    Assessment/Plan   1. CAD: Anterolateral STEMI, cath 8/24 showed occluded proximal LAD, unable to open.  Once we see how he recovers neurologically, will need to decide on feasibility of re-attempt PCI versus CABG.  - Continue ASA + statin.   2. Cardiogenic shock: Ischemic cardiomyopathy.   - Echo with EF 25%, normal RV.  Impella removed 8/28.  Now remains on milrinone 0.25 - Co-ox 69%, -  Continue milrinone 0.25 for now. Off NE - No ACE/ARB?ARNI or b-blocker yet with AKI and shock  3. Acute hypoxic respiratory failure - CCM following.  - Suspect combination of HF and possible aspiration PNA. PCT 1.08. - Oxygenation much improved with CVVHD. Still volume overloaded. Will continue to pull. Weight still up about 20 pounds - CXR still with diffuse infiltrates but improving slightly  - Continue abx for now - D/w CCM and patient's daughter at bedside. Will keep intubated and on CVVHD overweekend and reassess Monday for possible extubation vs trach. Family hesitant bout trach - Stop amio in case it is contributing (unlikely)  4. AKI:  -Suspect hemodynamic (ATN)   - Follow Creatinine 1.8 => 2 => 2.36=>2.6 => 2.65 => 3.01=> 3.64 - CVVHD started 9/3 - Continue. Appreciate Nephrology's assistance  5. Cardiac arrest/VF: 20 minutes downtime, concerned for anoxic brain injury.  Neurology following, per yesterday's note he seems to have intact brainstem function but minimal cortical function. EEG suggestive of diffuse encephalopathy. CT head without definite evidence for anoxic encephalopathy.  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 Situation now complicated by worsening renal and respiratory function  - Neuro status improving - small CVA on MRI. Echo on 9/1 with Definity -> no LV thrombus.Hold off on systemic AC at this time - Discussed with family again today. Will lighten sedation today an reassess coritcal function more closely  6. ID: Continues with low grade fevers - ? Aspiration PNA and sinus disease (seen on CT) - Continue zosyn. Cultures negative so far - Vanc completed - WBC still climbing - Consider fungal coverage  7. Anemia:  - Transfuse as needed. Keep hgb >= 7.5. Hgb 8.2 today  8. Elevated LFTs: Suspect  shock liver, improving.   9. DVT  prophylaxis: on heparin for CVVHD. Discussed dosing with PharmD personally.   10. Urinary retention - Foley  11. Hypernatremia. - Improving   CRITICAL CARE Performed by: Glori Bickers  Total critical care time: 45 minutes  Critical care time was exclusive of separately billable procedures and treating other patients.  Critical care was necessary to treat or prevent imminent or life-threatening deterioration.  Critical care was time spent personally by me (independent of midlevel providers or residents) on the following activities: development of treatment plan with patient and/or surrogate as well as nursing, discussions with consultants, evaluation of patient's response to treatment, examination of patient, obtaining history from patient or surrogate, ordering and performing treatments and interventions, ordering and review of laboratory studies, ordering and review of radiographic studies, pulse oximetry and re-evaluation of patient's condition.   Glori Bickers MD 04/27/2019 11:59 AM

## 2019-04-27 NOTE — Plan of Care (Signed)
  Problem: Clinical Measurements: Goal: Ability to maintain clinical measurements within normal limits will improve Outcome: Progressing Goal: Will remain free from infection Outcome: Progressing   Problem: Nutrition: Goal: Adequate nutrition will be maintained Outcome: Progressing   Problem: Elimination: Goal: Will not experience complications related to bowel motility Outcome: Progressing   Problem: Cardiac: Goal: Ability to achieve and maintain adequate cardiopulmonary perfusion will improve Outcome: Progressing Goal: Vascular access site(s) Level 0-1 will be maintained Outcome: Progressing   Problem: Clinical Measurements: Goal: Dialysis access will remain free of complications Outcome: Progressing   Problem: Fluid Volume: Goal: Fluid volume balance will be maintained or improved Outcome: Progressing

## 2019-04-27 NOTE — Progress Notes (Signed)
   04/27/19 1200  Neurological  Level of Consciousness Responds to Voice  Orientation Level Intubated/Tracheostomy - Unable to assess  Cognition Follows commands (attempts to follow commands for daughter)  R Hand Grip Weak  L Hand Grip Weak   R Foot Dorsiflexion Weak  L Foot Dorsiflexion Weak  LUE Motor Response Responds to commands  RLE Motor Response Responds to commands   With sedation turned down, patient's daughter spoke to him in [place]. Patient would open his eyes to her voice and appeared to try to move his hands and feet to her commands. Patient with significant generalized weakness. With continued communication, patient became agitated and resisting ventilator. Will try again later. Sedation resumed.

## 2019-04-27 NOTE — Progress Notes (Signed)
SLP Cancellation Note  Patient Details Name: Lawrence Olson [name] [id number] DOB: May 04, 1951   Cancelled treatment:       Reason Eval/Treat Not Completed: Patient not medically ready. Please reorder when ready to initiate tx after extubation.   Herbie Baltimore, MA CCC-SLP  Acute Rehabilitation Services Pager [phone] Office [phone]  Lynann Beaver 04/27/2019, 7:41 AM

## 2019-04-27 NOTE — Progress Notes (Signed)
Tracheal aspirate sent for C & S.

## 2019-04-27 NOTE — Progress Notes (Addendum)
NAMEMenachem Olson, [name]  [id number], DOB:  1951-07-25, LOS: 0 ADMISSION DATE:  04/16/2019, CONSULTATION DATE:  04/16/2019 REFERRING MD:  Claiborne Billings CHIEF COMPLAINT:  STEMI, resp failure  Brief History   68yo M out of hospital cardiac arrest with ROSC, intubated, found to have STEMI, taken emergently to cath lab. Proximal LAD lesion. PCCM consulted for management assistance.   Past Medical History   has no past medical history on file.   has a past surgical history that includes RIGHT HEART CATH AND CORONARY ANGIOGRAPHY (N/A, 04/16/2019) and VENTRICULAR ASSIST DEVICE INSERTION (N/A, 04/16/2019).  Significant Hospital Events   8/24: Cath Lab. PTCI unsuccessful despite multiple attempts.  RA 13 RV 42/16 PA 40/26 (31) PCWP 18 Pa sat 50%  Fick 2.7/1.3 CPO 0.6 Impella placed. VT /p same requiring defib.   8/27: Remains comatose with decorticate posturing.  Still on Impella support.  Weaning vasoactive drips.  At this point major question is neurological recovery.  Would need further cardiac intervention if he were to recover neurologically.  Neurology has been consulted. EEG - negative  8/28 - Impellla removed. Restart abx due to fevers  8/29 -  Daughter says he moves extremities in responds to [place] language.   8/30- Desats with mucus plugs, moving his head towards voice and opeening to voice and trying to focus  8/31 Bronchoscopy to eval for mucus plugs- Clean airways noted.   9/3 Dialysis cath placed. CRRT started  Consults:  Cardiology PCCM Heart failure  Nephrology  Procedures:  8/24: PTCI unsuccessful 8/14: Impella L groin>> 8/24: 69.5 ETT>> 8/24: L IJ CVC >>> 8/28: Rt radial A line 8/31: Bronch with BAL Significant Diagnostic Tests:  Echocardiogram 8/27: Well-positioned Impella cannula.  Continued severely depressed LV function with anteroapical wall motion abnormality.    Echo 04/24/2019- severely depressed left ventricular systolic function with   global  hypokinesis and anteroapical dyskinesis. LVEF 25-30%.  EEG 8/27: severe, diffuse encephalopathy without specific etiology  MRI brain 04/23/2019- punctate infarct in the caudate head.  No clear evidence of global hypoxic ischemic injury.   CXR 04/24/19: BILATERAL diffuse pulmonary infiltrates consistent with      pneumonia, perhaps slightly improved in the upper lobe  CXR 04/26/19: no interval change in interstitial and airspace opacities. Small pleural effusions present.  CXR 04/27/19: No significant interval change  Renal US 04/26/19: neg for hydronephrosis or stone    Renal US Micro Data:  COVID-19: Negative 8/24 blood cultures x2>>> negative Urine culture 8/27:>>> Negative MRSA swab: Positive BAL 8/31 > no growth  Antimicrobials:  cefepime from 8/24>8/30 Vancomycin 8/25>9/1 Zosyn  8/30 >  Interim history/subjective:  Remains intubated/sedated. Dialysis cath placed last afternoon. CRRT initiated yesterday.  Objective    Today's Vitals   04/27/19 1000 04/27/19 1100 04/27/19 1137 04/27/19 1200  BP: 122/61 115/63 115/63   Pulse: 93 93    Resp: (!) 21 (!) 24    Temp: 98.4 F (36.9 C) 98.1 F (36.7 C)    TempSrc: Esophageal   Esophageal  SpO2: 98% 92% 95%   Weight:      Height:      PainSc:       Vent Mode: PRVC FiO2 (%):  [40 %-80 %] 40 % Set Rate:  [21 bmp] 21 bmp Vt Set:  [510 mL] 510 mL PEEP:  [12 cmH20-15 cmH20] 12 cmH20 Plateau Pressure:  [13 cmH20-30 cmH20] 26 cmH20  CVP:  [6 mmHg-17 mmHg] 8 mmHg  Examination: Gen:  No acute distress. Sedated Intubated. Propofol restarted about 69mn prior to my exam HEENT:  Head atraumatic, no scleral icterus. ETT. Neck:     No masses; no thyromegaly. Left sided ialysis cath in place Lungs:    Bibasilar crackles and rhonchi.  CV:         Regular rate and regular rhythm; no murmurs.  +1 pitting  edema to bilateral lower extremities Abd:      + bowel sounds; soft, non-tender; no palpable masses, mildly distended Ext:  warm  to touch    Skin:      Warm and dry; no rash Neuro:  Did open eyes to voice today. Was not following commands. PERRL.   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  Patient was taken to the cath lab and found to have 100% proximal LAD lesion. PCI unsuccessful so impella placed (removed 8/28).    Acute systolic heart failure and cardiogenic shock status post cardiac arrest/ VF- r/t anterior MI, CAD with acute STEMI - 100% prox LAD  EF 25-30% with significant wall motion abnormalities.  Plan Cardiology recs: statin + aspirin for now  Acute hypoxic respiratory failure -pulmonary edema secondary to MI, cardiac arrest, cardiogenic shock vs infection.  Afebrile. White count trending up, 17-->23. Interval increase may be response to dialysis cath/CRRT yesterday.  Neg blood (5d) and BAL cultures  If this was attributable to infection, would expect some change on CXR and a decrease in white count considering length of time he has been receiving antibiotics. Unclear if this the white count is attributable to infectious source Decreased vent requirements following CRRT initiation. Down to 40% FiO2 from 100% yesterday. PEEP backed off to 12. Plan Continue trying to wean as able.  Sputum culture Will continue zosyn for now as white count is increasing.   AKI - suspicious for ATN. UA significant for hgb and hyaline casts CRRT started 9/3. Improvement in renal labs No significant findings on renal UKoreayesterday Plan CRRT per nephrology Continue K 461m   Acute encephalopathy - post cardiac arrest.  MRI not convincing for anoxic brain injury.  Puntuate infarct of right caudal head. No findings on echo consistent with cardiogenic source.  Pt was able to  follow some commands during SBT today when his wife spoke to him however he remains very weak and still requires sedation 2/2 agitation. I was unable to examine patient during the time that propofol was turned off. It was restarted about 10 min before my exam and patient was not following commands for me. Hopeful that this will continue to improve as we can draw back on sedation.   Hyperglycemia. Transitioned to subcut insulin yesterday. Blood sugars still running a little high.  Plan detrimer 25U. May consider needing to increase again but will continue to monitor today. SSI  Anemia of critical illness. Hgb stable. Plan: continue to monitor. Transaminitis. Likely attributable to to liver shock. Slowly improving. Plan: continue to monitor    Best practice Sedation: PAD protocol. Klonapin 0.13m40mid, versed PRN, Seroquel, propofol Nutrition: tubefeeds GI prophylaxis: pepcid DVT prophylaxis:Lovenox Glycemic control: SSI + detemir  Lines: Art line, central line day 10, NG, intubation day #10. Dialysis cath day #2 Family:  per primary team  Carmen   The patient is critically ill with multiple organ system failure and requires high complexity decision making for assessment and support, frequent evaluation and titration of therapies, advanced monitoring, review of radiographic studies and interpretation of complex data.   Critical Care Time devoted to patient care services, exclusive of separately billable procedures, described in this note is 35 minutes.   Mitzi Hansen, MD Internal Medicine Resident-PGY1 04/27/2019, 12:19 PM

## 2019-04-27 NOTE — Progress Notes (Signed)
[facility] NEPHROLOGY PROGRESS NOTE  Assessment/ Plan: Pt is a 68 y.o. yo male with cardiac arrest, downtime 20 minutes, found to have anterolateral STEMI subsequent cardiac cath showed proximal LAD occluded which was unable to obtain.  Patient developed cardiogenic shock consulted for AKI and fluid management.   #Acute kidney injury likely ATN in the setting of cardiac arrest and cardiogenic shock concomitant with IV contrast and overdiuresis: -Started CRRT on 9/3 for fluid management.  Right IJ catheter placed by PCCM on 9/3.  Total UF rate 100 to 150 cc an hour.  4K, IV heparin for anticoagulation.  Tolerating well.  Patient is nonoliguric.  Respiratory status improving.  Plan to continue CRRT today.  Patient is only on milrinone today. -UA and kidney ultrasound unremarkable.  #Cardiac arrest, CAD with anterolateral STEMI: Occluded proximal LAD unable to open.  Cardiology is following.  #Cardiogenic shock: EF 25%, Impella removed on 8/28.  Currently on milrinone.    #Acute respiratory failure with hypoxia: On vent.    Respiratory parameters improving.   Per PCCM.  #Anemia: Transfuse as needed.  Subjective: Seen and examined in ICU.  Tolerating CRRT well.  No problem with the catheter or filter.  On heparin.  Review of system unable to obtain. Objective Vital signs in last 24 hours: Vitals:   04/27/19 0403 04/27/19 0500 04/27/19 0600 04/27/19 0730  BP:  102/60 118/66 (!) 140/54  Pulse: 86 86 88 82  Resp: (!) 26 16 (!) 21 (!) 25  Temp:      TempSrc:      SpO2: 95% 97% 97%   Weight:      Height:       Weight change:   Intake/Output Summary (Last 24 hours) at 04/27/2019 0816 Last data filed at 04/27/2019 0800 Gross per 24 hour  Intake 2766.59 ml  Output 3555 ml  Net -788.41 ml       Labs: Basic Metabolic Panel: Recent Labs  Lab 04/25/19 0413 04/26/19 0445 04/26/19 1600 04/27/19 0240  NA 148* 149* 151* 145  K 3.4* 4.0 4.1 3.9  CL 108 108 111 110   CO2 '28 28 26 26  ' GLUCOSE 133* 218* 169* 195*  BUN 98* 117* 123* 88*  CREATININE 3.01* 3.64* 3.91* 2.80*  CALCIUM 7.9* 7.6* 7.6* 7.6*  PHOS 3.0  --  4.5 3.7   Liver Function Tests: Recent Labs  Lab 04/22/19 0248 04/26/19 1600 04/27/19 0240  AST 45*  --   --   ALT 48*  --   --   ALKPHOS 70  --   --   BILITOT 1.2  --   --   PROT 5.9*  --   --   ALBUMIN 2.3* 1.7* 1.7*   No results for input(s): LIPASE, AMYLASE in the last 168 hours. Recent Labs  Lab 04/23/19 0930  AMMONIA 40*   CBC: Recent Labs  Lab 04/23/19 0343 04/24/19 0335 04/25/19 0413 04/26/19 0445 04/27/19 0240  WBC 20.7* 19.4* 15.3* 17.1* 22.8*  HGB 9.0* 8.8* 8.0* 8.2* 8.2*  HCT 28.2* 27.5* 26.0* 26.6* 27.1*  MCV 96.6 97.2 98.1 100.0 99.6  PLT 257 335 388 467* 519*   Cardiac Enzymes: No results for input(s): CKTOTAL, CKMB, CKMBINDEX, TROPONINI in the last 168 hours. CBG: Recent Labs  Lab 04/26/19 1127 04/26/19 1608 04/26/19 2013 04/27/19 0004 04/27/19 0348  GLUCAP 213* 169* 133* 208* 175*    Iron Studies: No results for input(s): IRON, TIBC, TRANSFERRIN, FERRITIN in the last 72 hours. Studies/Results: US  Renal  Result Date: 04/26/2019 CLINICAL DATA:  68 year old male with acute renal insufficiency. EXAM: RENAL / URINARY TRACT ULTRASOUND COMPLETE COMPARISON:  None. FINDINGS: Right Kidney: Renal measurements: 14.3 x 6.0 x 6.5 cm = volume: 293 mL. Normal echogenicity. No hydronephrosis or shadowing stone. Left Kidney: Renal measurements: 14.4 x 6.5 x 6.1 cm = volume: 299 mL. Normal echogenicity. No hydronephrosis or shadowing stone. Bladder: The urinary bladder is decompressed around a Foley catheter. The liver is echogenic most commonly seen in the setting of fatty liver. There is a small perihepatic free fluid. Partially visualized left pleural effusion. IMPRESSION: 1. No hydronephrosis or shadowing stone. 2. Fatty liver. 3. Left pleural effusion and small perihepatic free fluid. Electronically Signed    By: Anner Crete M.D.   On: 04/26/2019 12:54   Dg Chest Port 1 View  Result Date: 04/27/2019 CLINICAL DATA:  Respiratory failure EXAM: PORTABLE CHEST 1 VIEW COMPARISON:  04/26/2019 FINDINGS: Cardiac shadow is stable. Jugular catheters are noted bilaterally. Endotracheal tube and gastric catheter are again seen. Diffuse bilateral airspace opacities are noted relatively stable from the prior exam. No new pneumothorax or effusion is seen. IMPRESSION: Stable bilateral infiltrates. Tubes and lines as described stable from the prior exam. Electronically Signed   By: Inez Catalina M.D.   On: 04/27/2019 06:55   Dg Chest Port 1 View  Result Date: 04/26/2019 CLINICAL DATA:  Status post central line placement today. EXAM: PORTABLE CHEST 1 VIEW COMPARISON:  Single-view of the chest earlier today. FINDINGS: New right IJ central venous catheter tip projects in the mid to lower superior vena cava. Endotracheal tube is in good position well above the carina. The patient's NG tube has backed out with its tip now in the distal esophagus. The tube should be advanced 13-14 cm for better positioning. Extensive bilateral airspace disease is worse on the right and unchanged. No pneumothorax or pleural fluid. Heart size is normal. IMPRESSION: Right IJ catheter tip projects in the mid to lower superior vena cava. No pneumothorax. NG tube has backed out with its tip now in the distal esophagus. Recommend advancement of 13-14 cm. No change in extensive bilateral airspace disease, worse on the right. Electronically Signed   By: Inge Rise M.D.   On: 04/26/2019 14:24   Dg Chest Port 1 View  Result Date: 04/26/2019 CLINICAL DATA:  Acute respiratory failure. Code STEMI, status post arrest. EXAM: PORTABLE CHEST 1 VIEW COMPARISON:  04/24/2019 FINDINGS: The endotracheal tube tip is above the carina. There is a NG tube with tip below GE junction. Heart size is normal. Small pleural effusions stable. Bilateral interstitial and  airspace opacities are identified unchanged from previous exam. IMPRESSION: 1. Stable support apparatus. 2. No change in bilateral interstitial and airspace opacities and small pleural effusions Electronically Signed   By: Kerby Moors M.D.   On: 04/26/2019 08:35    Medications: Infusions: .  prismasol BGK 4/2.5 500 mL/hr at 04/27/19 0215  .  prismasol BGK 4/2.5 300 mL/hr at 04/26/19 1556  . amiodarone 30 mg/hr (04/27/19 0800)  . dextrose    . feeding supplement (VITAL AF 1.2 CAL) 1,000 mL (04/26/19 2226)  . heparin 10,000 units/ 20 mL infusion syringe 2,050 Units/hr (04/27/19 0809)  . milrinone 0.25 mcg/kg/min (04/27/19 0800)  . piperacillin-tazobactam 100 mL/hr at 04/27/19 0800  . prismasol BGK 4/2.5 1,500 mL/hr at 04/27/19 0540  . propofol (DIPRIVAN) infusion 25 mcg/kg/min (04/27/19 0800)    Scheduled Medications: .  stroke: mapping our early stages of  recovery book   Does not apply Once  . sodium chloride   Intravenous Once  . aspirin  81 mg Oral Daily  . atorvastatin  80 mg Per Tube q1800  . B-complex with vitamin C  1 tablet Oral Daily  . chlorhexidine gluconate (MEDLINE KIT)  15 mL Mouth Rinse BID  . Chlorhexidine Gluconate Cloth  6 each Topical Daily  . clonazePAM  0.5 mg Per Tube BID  . famotidine  20 mg Per Tube Daily  . feeding supplement (PRO-STAT SUGAR FREE 64)  30 mL Per Tube BID  . heparin injection (subcutaneous)  5,000 Units Subcutaneous Q8H  . insulin aspart  1-3 Units Subcutaneous Q4H  . insulin aspart  7 Units Subcutaneous Q4H  . insulin detemir  25 Units Subcutaneous Q12H  . mouth rinse  15 mL Mouth Rinse 10 times per day  . potassium chloride  40 mEq Oral Daily  . QUEtiapine  100 mg Oral BID  . sodium chloride flush  10 mL Intravenous Q12H    have reviewed scheduled and prn medications.  Physical Exam: General: Intubated, sedated Heart:RRR, s1s2 nl, no rubs Lungs: Coarse breath sound bilateral Abdomen:soft, Non-tender, non-distended Extremities:  Lower extremity edema+ Dialysis Access: Right IJ temporary catheter placed by PCCM on 9/3.  Espiridion Supinski Prasad Merryn Thaker 04/27/2019,8:16 AM  LOS: 11 days  Pager: [phone]

## 2019-04-27 NOTE — Progress Notes (Signed)
PT Cancellation Note  Patient Details Name: Lawrence Olson [name] [id number] DOB: 09-Feb-1951   Cancelled Treatment:    Reason Eval/Treat Not Completed: Patient not medically ready; patient on CVVHD and slow wean on vent.  Still sedated.  Will discontinue PT, but please order PT again when more appropriate for participation based on progress.   Reginia Naas 04/27/2019, 12:20 PM  Magda Kiel, Lawrence Olson [phone] 04/27/2019

## 2019-04-28 DIAGNOSIS — I97121 Postprocedural cardiac arrest following other surgery: Secondary | ICD-10-CM

## 2019-04-28 DIAGNOSIS — E1159 Type 2 diabetes mellitus with other circulatory complications: Secondary | ICD-10-CM

## 2019-04-28 LAB — CBC
HCT: 27.1 % — ABNORMAL LOW (ref 39.0–52.0)
Hemoglobin: 8.3 g/dL — ABNORMAL LOW (ref 13.0–17.0)
MCH: 30.3 pg (ref 26.0–34.0)
MCHC: 30.6 g/dL (ref 30.0–36.0)
MCV: 98.9 fL (ref 80.0–100.0)
Platelets: 551 10*3/uL — ABNORMAL HIGH (ref 150–400)
RBC: 2.74 MIL/uL — ABNORMAL LOW (ref 4.22–5.81)
RDW: 15.8 % — ABNORMAL HIGH (ref 11.5–15.5)
WBC: 27.2 10*3/uL — ABNORMAL HIGH (ref 4.0–10.5)
nRBC: 0.1 % (ref 0.0–0.2)

## 2019-04-28 LAB — POCT I-STAT 7, (LYTES, BLD GAS, ICA,H+H)
Acid-Base Excess: 3 mmol/L — ABNORMAL HIGH (ref 0.0–2.0)
Bicarbonate: 27.7 mmol/L (ref 20.0–28.0)
Calcium, Ion: 1.13 mmol/L — ABNORMAL LOW (ref 1.15–1.40)
HCT: 26 % — ABNORMAL LOW (ref 39.0–52.0)
Hemoglobin: 8.8 g/dL — ABNORMAL LOW (ref 13.0–17.0)
O2 Saturation: 92 %
Patient temperature: 35.1
Potassium: 4 mmol/L (ref 3.5–5.1)
Sodium: 141 mmol/L (ref 135–145)
TCO2: 29 mmol/L (ref 22–32)
pCO2 arterial: 41 mmHg (ref 32.0–48.0)
pH, Arterial: 7.429 (ref 7.350–7.450)
pO2, Arterial: 56 mmHg — ABNORMAL LOW (ref 83.0–108.0)

## 2019-04-28 LAB — GLUCOSE, CAPILLARY
Glucose-Capillary: 119 mg/dL — ABNORMAL HIGH (ref 70–99)
Glucose-Capillary: 125 mg/dL — ABNORMAL HIGH (ref 70–99)
Glucose-Capillary: 125 mg/dL — ABNORMAL HIGH (ref 70–99)
Glucose-Capillary: 140 mg/dL — ABNORMAL HIGH (ref 70–99)
Glucose-Capillary: 150 mg/dL — ABNORMAL HIGH (ref 70–99)
Glucose-Capillary: 155 mg/dL — ABNORMAL HIGH (ref 70–99)
Glucose-Capillary: 159 mg/dL — ABNORMAL HIGH (ref 70–99)

## 2019-04-28 LAB — MAGNESIUM: Magnesium: 2.8 mg/dL — ABNORMAL HIGH (ref 1.7–2.4)

## 2019-04-28 LAB — RENAL FUNCTION PANEL
Albumin: 1.8 g/dL — ABNORMAL LOW (ref 3.5–5.0)
Albumin: 2.1 g/dL — ABNORMAL LOW (ref 3.5–5.0)
Anion gap: 9 (ref 5–15)
Anion gap: 11 (ref 5–15)
BUN: 51 mg/dL — ABNORMAL HIGH (ref 8–23)
BUN: 47 mg/dL — ABNORMAL HIGH (ref 8–23)
CO2: 26 mmol/L (ref 22–32)
CO2: 23 mmol/L (ref 22–32)
Calcium: 7.8 mg/dL — ABNORMAL LOW (ref 8.9–10.3)
Calcium: 8.1 mg/dL — ABNORMAL LOW (ref 8.9–10.3)
Chloride: 104 mmol/L (ref 98–111)
Chloride: 104 mmol/L (ref 98–111)
Creatinine, Ser: 1.86 mg/dL — ABNORMAL HIGH (ref 0.61–1.24)
Creatinine, Ser: 1.84 mg/dL — ABNORMAL HIGH (ref 0.61–1.24)
GFR calc Af Amer: 42 mL/min — ABNORMAL LOW (ref >60)
GFR calc Af Amer: 43 mL/min — ABNORMAL LOW (ref >60)
GFR calc non Af Amer: 36 mL/min — ABNORMAL LOW (ref >60)
GFR calc non Af Amer: 37 mL/min — ABNORMAL LOW (ref >60)
Glucose, Bld: 163 mg/dL — ABNORMAL HIGH (ref 70–99)
Glucose, Bld: 128 mg/dL — ABNORMAL HIGH (ref 70–99)
Phosphorus: 4.1 mg/dL (ref 2.5–4.6)
Phosphorus: 3.7 mg/dL (ref 2.5–4.6)
Potassium: 4 mmol/L (ref 3.5–5.1)
Potassium: 5 mmol/L (ref 3.5–5.1)
Sodium: 139 mmol/L (ref 135–145)
Sodium: 138 mmol/L (ref 135–145)

## 2019-04-28 LAB — COOXEMETRY PANEL
Carboxyhemoglobin: 1.6 % — ABNORMAL HIGH (ref 0.5–1.5)
Methemoglobin: 0.9 % (ref 0.0–1.5)
O2 Saturation: 75.8 %
Total hemoglobin: 8.4 g/dL — ABNORMAL LOW (ref 12.0–16.0)

## 2019-04-28 LAB — POCT ACTIVATED CLOTTING TIME
Activated Clotting Time: 208 seconds
Activated Clotting Time: 213 seconds
Activated Clotting Time: 224 seconds
Activated Clotting Time: 230 seconds
Activated Clotting Time: 219 seconds
Activated Clotting Time: 219 seconds
Activated Clotting Time: 224 seconds
Activated Clotting Time: 230 seconds
Activated Clotting Time: 213 seconds
Activated Clotting Time: 219 seconds
Activated Clotting Time: 224 seconds
Activated Clotting Time: 224 seconds
Activated Clotting Time: 224 seconds
Activated Clotting Time: 230 seconds
Activated Clotting Time: 219 seconds
Activated Clotting Time: 219 seconds

## 2019-04-28 LAB — APTT
aPTT: >200 seconds (ref 24–36)
aPTT: >200 seconds (ref 24–36)

## 2019-04-28 LAB — LACTATE DEHYDROGENASE: LDH: 425 U/L — ABNORMAL HIGH (ref 98–192)

## 2019-04-28 MED ORDER — PROPOFOL 1000 MG/100ML IV EMUL
5.0000 ug/kg/min | INTRAVENOUS | Status: DC
  Administered 2019-04-28: 30 ug/kg/min via INTRAVENOUS
  Administered 2019-04-28: 45 ug/kg/min via INTRAVENOUS
  Administered 2019-04-28: 40 ug/kg/min via INTRAVENOUS
  Administered 2019-04-29: 18:00:00 10 ug/kg/min via INTRAVENOUS
  Administered 2019-04-29: 05:00:00 45 ug/kg/min via INTRAVENOUS
  Administered 2019-04-29: 30 ug/kg/min via INTRAVENOUS
  Administered 2019-04-29 (×2): 45 ug/kg/min via INTRAVENOUS
  Administered 2019-04-30: 40 ug/kg/min via INTRAVENOUS
  Administered 2019-04-30: 15 ug/kg/min via INTRAVENOUS
  Administered 2019-05-01: 20 ug/kg/min via INTRAVENOUS
  Filled 2019-04-28 (×10): qty 100

## 2019-04-28 MED ORDER — SODIUM CHLORIDE 0.9 % IV SOLN
1.0000 g | Freq: Two times a day (BID) | INTRAVENOUS | Status: DC
  Administered 2019-04-28 – 2019-05-01 (×8): 1 g via INTRAVENOUS
  Filled 2019-04-28 (×10): qty 1

## 2019-04-28 MED ORDER — INSULIN DETEMIR 100 UNIT/ML ~~LOC~~ SOLN
35.0000 [IU] | Freq: Two times a day (BID) | SUBCUTANEOUS | Status: DC
  Administered 2019-04-28 – 2019-04-29 (×3): 35 [IU] via SUBCUTANEOUS
  Filled 2019-04-28 (×4): qty 0.35

## 2019-04-28 MED ORDER — VANCOMYCIN HCL IN DEXTROSE 1-5 GM/200ML-% IV SOLN
1000.0000 mg | INTRAVENOUS | Status: DC
  Administered 2019-04-29 – 2019-04-30 (×2): 1000 mg via INTRAVENOUS
  Filled 2019-04-28 (×3): qty 200

## 2019-04-28 MED ORDER — VANCOMYCIN HCL 10 G IV SOLR
1500.0000 mg | Freq: Once | INTRAVENOUS | Status: AC
  Administered 2019-04-28: 1500 mg via INTRAVENOUS
  Filled 2019-04-28: qty 1500

## 2019-04-28 NOTE — Progress Notes (Signed)
Pharmacy Antibiotic Note  Lawrence Olson is a 68 y.o. male admitted on 04/16/2019 with cardiogenic shock s/p arrest. Pt on ABX initially then D/Cd on 8/26. Restarted 8/28 for pneumonia. Now with rising WBC to 27, previously hypothermic and now temp up to 99.7.  CRRT currently running without interruptions  Plan: Stop Zosyn Start meropenem 1 g IV q12h Start vancomycin 1500 mg IV x 1, then 1000 mg IV  Follow CRRT plans and toleration Monitor repeat blood cultures, CRRT duration, vancomycin levels as needed  Height: _0  (167.6 cm) Weight: 201 lb 8 oz (91.4 kg) IBW/kg (Calculated) : 63.8  Temp (24hrs), Avg:96.9 F (36.1 C), Min:94.3 F (34.6 C), Max:99.7 F (37.6 C)  Recent Labs  Lab 04/24/19 0335 04/25/19 0413 04/26/19 0445 04/26/19 1600 04/27/19 0240 04/27/19 1536 04/28/19 0319  WBC 19.4* 15.3* 17.1*  --  22.8*  --  27.2*  CREATININE 2.65* 3.01* 3.64* 3.91* 2.80* 2.14* 1.86*    Estimated Creatinine Clearance: 40.2 mL/min (A) (by C-G formula based on SCr of 1.86 mg/dL (H)).    No Known Allergies  Antimicrobials this admission: Vancomycin 8/24>>8/25; 8/28>>9/2; 9/5>> Cefepime 8/24>>8/26; 8/28>>8/30 Zosyn 8/30>>9/5 Meropenem 9/5>>  Microbiology results: 9/5 BCx -  9/4 TA - reincubated 8/31 BAL - ngF 8/29 BCx: ngF 8/29 Resp Cx: normal flora 8/24 BCx: ngF 8/24 COVID: neg 8/24 MRSA: positive  Thank you for allowing pharmacy to be a part of this patient's care.  Vertis Kelch, PharmD PGY2 Cardiology Pharmacy Resident Phone [phone] 04/28/2019       12:01 PM  Please check [email or web address] for unit-specific pharmacist phone numbers

## 2019-04-28 NOTE — Progress Notes (Signed)
[facility] NEPHROLOGY PROGRESS NOTE  Assessment/ Plan: Pt is a 68 y.o. yo male with cardiac arrest, downtime 20 minutes, found to have anterolateral STEMI subsequent cardiac cath showed proximal LAD occluded which was unable to obtain.  Patient developed cardiogenic shock consulted for AKI and fluid management.   #Acute kidney injury likely ATN in the setting of cardiac arrest and cardiogenic shock concomitant with IV contrast and overdiuresis: -Started CRRT on 9/3 for fluid management.  Right IJ catheter placed by PCCM on 9/3.  Total UF rate 100-150 cc an hour.  4K, IV heparin for anticoagulation.  Tolerating well.  Patient is nonoliguric.  Respiratory status improving.  Plan to continue CRRT.  Patient is only on milrinone today. -UA and kidney ultrasound unremarkable.  #Cardiac arrest, CAD with anterolateral STEMI: Occluded proximal LAD unable to open.  Cardiology is following.  #Cardiogenic shock: EF 25%, Impella removed on 8/28.  Currently on milrinone.    #Acute respiratory failure with hypoxia: On vent.    Respiratory parameters improving.   Per PCCM.  #Anemia: Transfuse as needed.  Subjective: Seen and examined in ICU.  Tolerating CRRT well.  No issue with filter or catheter.  Urine output around 1255 cc. Objective Vital signs in last 24 hours: Vitals:   04/28/19 0700 04/28/19 0715 04/28/19 0800 04/28/19 0810  BP: (!) 106/58  120/68   Pulse: 88 86 93 98  Resp: (!) 22 (!) 26 (!) 26 (!) 26  Temp: (!) 97.5 F (36.4 C) (!) 97.5 F (36.4 C) 98.4 F (36.9 C)   TempSrc:      SpO2: 100% 100% 100% 98%  Weight:      Height:       Weight change:   Intake/Output Summary (Last 24 hours) at 04/28/2019 0911 Last data filed at 04/28/2019 0900 Gross per 24 hour  Intake 2136.67 ml  Output 6930 ml  Net -4793.33 ml       Labs: Basic Metabolic Panel: Recent Labs  Lab 04/27/19 0240  04/27/19 1536 04/28/19 0319 04/28/19 0346  NA 145   < > 142 139 141  K 3.9    < > 4.1 4.0 4.0  CL 110  --  106 104  --   CO2 26  --  25 26  --   GLUCOSE 195*  --  140* 163*  --   BUN 88*  --  61* 51*  --   CREATININE 2.80*  --  2.14* 1.86*  --   CALCIUM 7.6*  --  7.8* 7.8*  --   PHOS 3.7  --  3.3 4.1  --    < > = values in this interval not displayed.   Liver Function Tests: Recent Labs  Lab 04/22/19 0248  04/27/19 0240 04/27/19 1536 04/28/19 0319  AST 45*  --   --   --   --   ALT 48*  --   --   --   --   ALKPHOS 70  --   --   --   --   BILITOT 1.2  --   --   --   --   PROT 5.9*  --   --   --   --   ALBUMIN 2.3*   < > 1.7* 1.9* 1.8*   < > = values in this interval not displayed.   No results for input(s): LIPASE, AMYLASE in the last 168 hours. Recent Labs  Lab 04/23/19 0930  AMMONIA 40*   CBC: Recent Labs  Lab 04/24/19 0335 04/25/19 0413  04/26/19 0445  04/27/19 0240 04/27/19 1147 04/28/19 0319 04/28/19 0346  WBC 19.4* 15.3*  --  17.1*  --  22.8*  --  27.2*  --   HGB 8.8* 8.0*   < > 8.2*   < > 8.2* 8.8* 8.3* 8.8*  HCT 27.5* 26.0*   < > 26.6*   < > 27.1* 26.0* 27.1* 26.0*  MCV 97.2 98.1  --  100.0  --  99.6  --  98.9  --   PLT 335 388  --  467*  --  519*  --  551*  --    < > = values in this interval not displayed.   Cardiac Enzymes: No results for input(s): CKTOTAL, CKMB, CKMBINDEX, TROPONINI in the last 168 hours. CBG: Recent Labs  Lab 04/27/19 1540 04/27/19 1947 04/27/19 2335 04/28/19 0333 04/28/19 0748  GLUCAP 131* 125* 140* 159* 150*    Iron Studies: No results for input(s): IRON, TIBC, TRANSFERRIN, FERRITIN in the last 72 hours. Studies/Results: US Renal  Result Date: 04/26/2019 CLINICAL DATA:  68 year old male with acute renal insufficiency. EXAM: RENAL / URINARY TRACT ULTRASOUND COMPLETE COMPARISON:  None. FINDINGS: Right Kidney: Renal measurements: 14.3 x 6.0 x 6.5 cm = volume: 293 mL. Normal echogenicity. No hydronephrosis or shadowing stone. Left Kidney: Renal measurements: 14.4 x 6.5 x 6.1 cm = volume: 299 mL. Normal  echogenicity. No hydronephrosis or shadowing stone. Bladder: The urinary bladder is decompressed around a Foley catheter. The liver is echogenic most commonly seen in the setting of fatty liver. There is a small perihepatic free fluid. Partially visualized left pleural effusion. IMPRESSION: 1. No hydronephrosis or shadowing stone. 2. Fatty liver. 3. Left pleural effusion and small perihepatic free fluid. Electronically Signed   By: Anner Crete M.D.   On: 04/26/2019 12:54   Dg Chest Port 1 View  Result Date: 04/27/2019 CLINICAL DATA:  Respiratory failure EXAM: PORTABLE CHEST 1 VIEW COMPARISON:  04/26/2019 FINDINGS: Cardiac shadow is stable. Jugular catheters are noted bilaterally. Endotracheal tube and gastric catheter are again seen. Diffuse bilateral airspace opacities are noted relatively stable from the prior exam. No new pneumothorax or effusion is seen. IMPRESSION: Stable bilateral infiltrates. Tubes and lines as described stable from the prior exam. Electronically Signed   By: Inez Catalina M.D.   On: 04/27/2019 06:55   Dg Chest Port 1 View  Result Date: 04/26/2019 CLINICAL DATA:  Status post central line placement today. EXAM: PORTABLE CHEST 1 VIEW COMPARISON:  Single-view of the chest earlier today. FINDINGS: New right IJ central venous catheter tip projects in the mid to lower superior vena cava. Endotracheal tube is in good position well above the carina. The patient's NG tube has backed out with its tip now in the distal esophagus. The tube should be advanced 13-14 cm for better positioning. Extensive bilateral airspace disease is worse on the right and unchanged. No pneumothorax or pleural fluid. Heart size is normal. IMPRESSION: Right IJ catheter tip projects in the mid to lower superior vena cava. No pneumothorax. NG tube has backed out with its tip now in the distal esophagus. Recommend advancement of 13-14 cm. No change in extensive bilateral airspace disease, worse on the right.  Electronically Signed   By: Inge Rise M.D.   On: 04/26/2019 14:24    Medications: Infusions: .  prismasol BGK 4/2.5 500 mL/hr at 04/28/19 0902  .  prismasol BGK 4/2.5 300 mL/hr at 04/27/19 2243  . sodium chloride 10 mL/hr  at 04/28/19 0900  . dextrose    . feeding supplement (VITAL AF 1.2 CAL) 65 mL/hr at 04/28/19 0900  . heparin 10,000 units/ 20 mL infusion syringe 2,100 Units/hr (04/28/19 0840)  . milrinone 0.25 mcg/kg/min (04/28/19 0900)  . piperacillin-tazobactam Stopped (04/28/19 0839)  . prismasol BGK 4/2.5 1,500 mL/hr at 04/28/19 0843  . propofol (DIPRIVAN) infusion 10 mcg/kg/min (04/28/19 0900)    Scheduled Medications: .  stroke: mapping our early stages of recovery book   Does not apply Once  . sodium chloride   Intravenous Once  . aspirin  81 mg Oral Daily  . atorvastatin  80 mg Per Tube q1800  . B-complex with vitamin C  1 tablet Oral Daily  . chlorhexidine gluconate (MEDLINE KIT)  15 mL Mouth Rinse BID  . Chlorhexidine Gluconate Cloth  6 each Topical Daily  . clonazePAM  0.5 mg Per Tube BID  . famotidine  20 mg Per Tube Daily  . feeding supplement (PRO-STAT SUGAR FREE 64)  30 mL Per Tube BID  . heparin injection (subcutaneous)  5,000 Units Subcutaneous Q8H  . insulin aspart  1-3 Units Subcutaneous Q4H  . insulin aspart  7 Units Subcutaneous Q4H  . insulin detemir  25 Units Subcutaneous Q12H  . mouth rinse  15 mL Mouth Rinse 10 times per day  . potassium chloride  40 mEq Oral Daily  . QUEtiapine  100 mg Oral BID  . sodium chloride flush  10 mL Intravenous Q12H    have reviewed scheduled and prn medications.  Physical Exam: General: Intubated, mild agitation intermittently Heart:RRR, s1s2 nl, no rubs Lungs: Coarse breath sound bilateral Abdomen:soft, Non-tender, non-distended Extremities: Lower extremity edema+ improving Dialysis Access: Right IJ temporary catheter placed by PCCM on 9/3.  Dron Prasad Bhandari 04/28/2019,9:11 AM  LOS: 12 days  Pager:  [phone]

## 2019-04-28 NOTE — Progress Notes (Signed)
NAMEKemoni Olson, [name]  [id number], DOB:  September 18, 1950, LOS: 0 ADMISSION DATE:  04/16/2019, CONSULTATION DATE:  04/16/2019 REFERRING MD:  Claiborne Billings CHIEF COMPLAINT:  STEMI, resp failure  Brief History   68yo M out of hospital cardiac arrest with ROSC, intubated, found to have STEMI, taken emergently to cath lab. Proximal LAD lesion. PCCM consulted for management assistance.   Past Medical History    has a past surgical history that includes RIGHT HEART CATH AND CORONARY ANGIOGRAPHY (N/A, 04/16/2019) and VENTRICULAR ASSIST DEVICE INSERTION (N/A, 04/16/2019).  Significant Hospital Events   8/24: Cath Lab. PTCI unsuccessful despite multiple attempts.  RA 13 RV 42/16 PA 40/26 (31) PCWP 18 Pa sat 50%  Fick 2.7/1.3 CPO 0.6 Impella placed. VT /p same requiring defib.   8/27: Remains comatose with decorticate posturing.  Still on Impella support.  Weaning vasoactive drips.  At this point major question is neurological recovery.  Would need further cardiac intervention if he were to recover neurologically.  Neurology has been consulted. EEG - negative  8/28 - Impellla removed. Restart abx due to fevers  8/29 -  Daughter says he moves extremities in responds to [place] language.   8/30- Desats with mucus plugs, moving his head towards voice and opeening to voice and trying to focus  8/31 Bronchoscopy to eval for mucus plugs- Clean airways noted.   9/3 Dialysis cath placed. CRRT started  Consults:  Cardiology PCCM Heart failure  Nephrology  Procedures:  8/24: PTCI unsuccessful 8/14: Impella L groin>> 8/24: 7.5 ETT>> 8/24: L IJ CVC >>> 8/28: Rt radial A line 8/31: Bronch with BAL Significant Diagnostic Tests:  Echocardiogram 8/27: Well-positioned Impella cannula.  Continued severely depressed LV function with anteroapical wall motion abnormality.    Echo 04/24/2019- severely depressed left ventricular systolic function with   global hypokinesis and anteroapical dyskinesis. LVEF  25-30%.  EEG 8/27: severe, diffuse encephalopathy without specific etiology  MRI brain 04/23/2019- punctate infarct in the caudate head.  No clear evidence of global hypoxic ischemic injury.   CXR 04/24/19: BILATERAL diffuse pulmonary infiltrates consistent with      pneumonia, perhaps slightly improved in the upper lobe  CXR 04/26/19: no interval change in interstitial and airspace opacities. Small pleural effusions present.  CXR 04/27/19: No significant interval change  Renal US 04/26/19: neg for hydronephrosis or stone Micro Data:  COVID-19: Negative 8/24 blood cultures x2>>> negative Urine culture 8/27:>>> Negative MRSA swab: Positive BAL 8/31 > no growth Blood culture 829 > NG, final Respiratory culture 9/4, rare GPC  Antimicrobials:  cefepime from 8/24>8/30 Vancomycin 8/25>9/1 Zosyn  8/30 >9/5  Interim history/subjective:  Remains intubated and sedated.  Daughter at bedside working with him on following commands.  Doing well with volume removal with CRRT.  Objective    Today's Vitals   04/28/19 0810 04/28/19 0900 04/28/19 1000 04/28/19 1100  BP:  124/67 117/68 122/69  Pulse: 98 (!) 102 (!) 105 (!) 103  Resp: (!) 26 (!) 24 (!) 22 (!) 26  Temp:  98.6 F (37 C) 99.3 F (37.4 C) 99.7 F (37.6 C)  TempSrc:      SpO2: 98% 97% 98% 99%  Weight:      Height:      PainSc:       Vent Mode: CPAP;PSV FiO2 (%):  [40 %-60 %] 50 % Set Rate:  [21 bmp] 21 bmp Vt Set:  [510 mL] 510 mL PEEP:  [8 cmH20-12 cmH20] 8 cmH20 Pressure Support:  [15 cmH20] 15  cmH20 Plateau Pressure:  [16 cmH20-22 cmH20] 20 cmH20  CVP:  [4 mmHg-9 mmHg] 7 mmHg  Examination: Gen:      Elderly man lying in bed no acute distress. RASS 0, CAM+ HEENT: normocephalic, atraumatic, eyes anicteric Neck:     L IJ CVC, RIJ trialysis  lungs:    Clear to auscultation bilaterally, thick secretions from ET tube CV:         Tachycardic, regular rhythm Abd: Hypoactive bowel sounds, soft, nontender Ext: Warm, no pitting  edema    Skin:    No rashes or erythema surrounding vascular access sites Neuro: Eyes open spontaneously, looking around the room, but not always tracking.  More responsive to daughter when speaking [place].  Not consistently following commands.  Globally weak.  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  Patient was taken to the cath lab and found to have 100% proximal LAD lesion. PCI unsuccessful so impella placed (removed 8/28).    Acute systolic heart failure and cardiogenic shock status post cardiac arrest/ VF-due to anterior STEMI- 100% prox LAD, unable to be revascularized.  EF 25-30% with significant wall motion abnormalities.  Plan -Continue statin and aspirin -Continue milrinone per cardiology  Acute hypoxic respiratory failure -pulmonary edema secondary to MI, cardiac arrest, cardiogenic shock vs infection.  Hypothermic with increasing leukocytosis.  Concerning for sepsis.  Source unknown, but thick sputum raises concern for VAP. Plan -Continue volume removal working towards euvolemia -Repeat sputum culture today - Agree with plan to escalate antibiotics to meropenem and anti--MRSA coverage -Daily SAT and SBT   AKI - ATN due to shock CRRT started 9/3. Improvement in renal labs No significant findings on renal US yesterday Plan -CRRT per nephrology -Continue to monitor intake and output record -Avoid nephrotoxic medications   Acute encephalopathy - post cardiac arrest.  MRI not convincing for anoxic brain injury.  Puntuate infarct of right caudal head. No findings on echo consistent with cardiogenic source.  - Delirium precautions - We will continue aggressive care measures and ongoing assessment to help guide his family  regarding his wishes moving forward.   Hyperglycemia. Transitioned to subcut insulin yesterday. Blood sugars still running a little high.  Plan -Increase detemir to 35 units twice daily -Sliding scale insulin PRN -Goal blood glucose 140-180 while admitted to the ICU  Anemia of critical illness and repeat lab draws. Hgb stable.  -Continue to monitor - Would not transfuse unless hemoglobin less than 7 or acutely bleeding with hemodynamic instability  Best practice Sedation: PAD protocol. Klonapin 0.25m bid, versed PRN, Seroquel, propofol.  Prefer to avoid benzodiazepines. Nutrition: tubefeeds GI prophylaxis: pepcid DVT prophylaxis:Lovenox Glycemic control: SSI + detemir  Lines: A-line, L IJ CVC, RIJ paralysis, ET tube, gastric tube, Foley, rectal tube Family: Daughter updated at bedside during rounds  This patient is critically ill with multiple organ system failure which requires frequent high complexity decision making, assessment, support, evaluation, and titration of therapies. This was completed through the application of advanced monitoring technologies and extensive interpretation of multiple databases. During this encounter critical care time was devoted to patient care services described in this note for 35 minutes.  Requires ongoing ICU support given continue mechanical ventilation requirement.  Julian Hy, DO 04/28/19 12:24 PM [facility]

## 2019-04-28 NOTE — Progress Notes (Signed)
Patient ID: Lawrence Olson, male   DOB: 01-06-1951, 68 y.o.   [name] [id number]   Advanced Heart Failure Team  Note   Primary Physician: Patient, No Pcp Per PCP-Cardiologist:  No primary care provider on file.  HPI:    Remains on CVVHD. CVP down to 9. Weight down to 15.   Vent down to 50%.  On milrinone 0.25. Off NE. Co-ox 75%   Sedated on propofol. Will awaken and seems to move arms on commands but not convincingly so.   Remains on zosyn. Vanc stopped. Tmax 99.2  Hypothermic at times WBC up to 17k -> 22.8k -> 27.2  Echo 04/24/19 EF 25-30% no LV thrombus   Objective:    Vital Signs:   Temp:  [94.3 F (34.6 C)-99.7 F (37.6 C)] 99.7 F (37.6 C) (09/05 1100) Pulse Rate:  [74-105] 103 (09/05 1100) Resp:  [14-29] 26 (09/05 1100) BP: (97-124)/(55-69) 122/69 (09/05 1100) SpO2:  [96 %-100 %] 99 % (09/05 1100) Arterial Line BP: (113-155)/(46-58) 136/58 (09/05 1100) FiO2 (%):  [40 %-60 %] 50 % (09/05 0810) Weight:  [91.4 kg] 91.4 kg (09/05 0500) Last BM Date: 04/28/19  Weight change: Filed Weights   04/25/19 0400 04/26/19 0657 04/28/19 0500  Weight: 97.5 kg 98 kg 91.4 kg    Intake/Output:   Intake/Output Summary (Last 24 hours) at 04/28/2019 1144 Last data filed at 04/28/2019 1100 Gross per 24 hour  Intake 2049.92 ml  Output 6845 ml  Net -4795.08 ml      Physical Exam   General:  Intubated/sedated HEENT: normal + ETT Neck: supple. RIJ trialysis LIJ TLC . Carotids 2+ bilat; no bruits. No lymphadenopathy or thryomegaly appreciated. Cor: PMI nondisplaced. Regular rate & rhythm. No rubs, gallops or murmurs. Lungs: coarse Abdomen: obese soft, nontender, nondistended. No hepatosplenomegaly. No bruits or masses. Good bowel sounds. Extremities: no cyanosis, clubbing, rash, 2+ edema Neuro: sedated on vent   Telemetry   NSR 90-105s Personally reviewed  Labs   Basic Metabolic Panel: Recent Labs  Lab 04/23/19 0343 04/24/19 0335 04/25/19 0413  04/26/19 0445  04/26/19 1600  04/27/19 0240 04/27/19 1147 04/27/19 1536 04/28/19 0319 04/28/19 0346  NA 142 142 148*   < > 149*   < > 151* 145 145 142 139 141  K 4.0 3.4* 3.4*   < > 4.0   < > 4.1 3.9 4.9 4.1 4.0 4.0  CL 103 104 108  --  108  --  111 110  --  106 104  --   CO2 '27 26 28  ' --  28  --  26 26  --  25 26  --   GLUCOSE 297* 343* 133*  --  218*  --  169* 195*  --  140* 163*  --   BUN 76* 88* 98*  --  117*  --  123* 88*  --  61* 51*  --   CREATININE 2.65* 2.65* 3.01*  --  3.64*  --  3.91* 2.80*  --  2.14* 1.86*  --   CALCIUM 8.5* 8.0* 7.9*  --  7.6*  --  7.6* 7.6*  --  7.8* 7.8*  --   MG 2.4 2.4 2.5*  --   --   --   --  2.7*  --   --  2.8*  --   PHOS 3.4 2.9 3.0  --   --   --  4.5 3.7  --  3.3 4.1  --    < > = values in  this interval not displayed.    Liver Function Tests: Recent Labs  Lab 04/22/19 0248 04/26/19 1600 04/27/19 0240 04/27/19 1536 04/28/19 0319  AST 45*  --   --   --   --   ALT 48*  --   --   --   --   ALKPHOS 70  --   --   --   --   BILITOT 1.2  --   --   --   --   PROT 5.9*  --   --   --   --   ALBUMIN 2.3* 1.7* 1.7* 1.9* 1.8*   No results for input(s): LIPASE, AMYLASE in the last 168 hours. Recent Labs  Lab 04/23/19 0930  AMMONIA 40*    CBC: Recent Labs  Lab 04/24/19 0335 04/25/19 0413  04/26/19 0445  04/26/19 1455 04/27/19 0240 04/27/19 1147 04/28/19 0319 04/28/19 0346  WBC 19.4* 15.3*  --  17.1*  --   --  22.8*  --  27.2*  --   HGB 8.8* 8.0*   < > 8.2*   < > 8.2* 8.2* 8.8* 8.3* 8.8*  HCT 27.5* 26.0*   < > 26.6*   < > 24.0* 27.1* 26.0* 27.1* 26.0*  MCV 97.2 98.1  --  100.0  --   --  99.6  --  98.9  --   PLT 335 388  --  467*  --   --  519*  --  551*  --    < > = values in this interval not displayed.    Cardiac Enzymes: No results for input(s): CKTOTAL, CKMB, CKMBINDEX, TROPONINI in the last 168 hours.  BNP: BNP (last 3 results) No results for input(s): BNP in the last 8760 hours.  ProBNP (last 3 results) No results for input(s): PROBNP in the last 8760  hours.   CBG: Recent Labs  Lab 04/27/19 1540 04/27/19 1947 04/27/19 2335 04/28/19 0333 04/28/19 0748  GLUCAP 131* 125* 140* 159* 150*    Coagulation Studies: Recent Labs    04/27/19 0240  LABPROT 15.2  INR 1.2     Imaging   No results found.   Medications:     Current Medications: .  stroke: mapping our early stages of recovery book   Does not apply Once  . sodium chloride   Intravenous Once  . aspirin  81 mg Oral Daily  . atorvastatin  80 mg Per Tube q1800  . B-complex with vitamin C  1 tablet Oral Daily  . chlorhexidine gluconate (MEDLINE KIT)  15 mL Mouth Rinse BID  . Chlorhexidine Gluconate Cloth  6 each Topical Daily  . clonazePAM  0.5 mg Per Tube BID  . famotidine  20 mg Per Tube Daily  . feeding supplement (PRO-STAT SUGAR FREE 64)  30 mL Per Tube BID  . heparin injection (subcutaneous)  5,000 Units Subcutaneous Q8H  . insulin aspart  1-3 Units Subcutaneous Q4H  . insulin aspart  7 Units Subcutaneous Q4H  . insulin detemir  25 Units Subcutaneous Q12H  . mouth rinse  15 mL Mouth Rinse 10 times per day  . potassium chloride  40 mEq Oral Daily  . QUEtiapine  100 mg Oral BID  . sodium chloride flush  10 mL Intravenous Q12H    Infusions: .  prismasol BGK 4/2.5 500 mL/hr at 04/28/19 0902  .  prismasol BGK 4/2.5 300 mL/hr at 04/27/19 2243  . sodium chloride 10 mL/hr at 04/28/19 1100  . dextrose    .  feeding supplement (VITAL AF 1.2 CAL) 65 mL/hr at 04/28/19 1100  . heparin 10,000 units/ 20 mL infusion syringe 2,000 Units/hr (04/28/19 1100)  . milrinone 0.25 mcg/kg/min (04/28/19 1100)  . piperacillin-tazobactam Stopped (04/28/19 0839)  . prismasol BGK 4/2.5 1,500 mL/hr at 04/28/19 0843  . propofol (DIPRIVAN) infusion Stopped (04/28/19 1006)    Assessment/Plan   1. CAD: Anterolateral STEMI, cath 8/24 showed occluded proximal LAD, unable to open.  Once we see how he recovers neurologically, will need to decide on feasibility of re-attempt PCI versus  CABG.  - Continue ASA + statin.   2. Cardiogenic shock: Ischemic cardiomyopathy.   - Echo with EF 25%, normal RV.  Impella removed 8/28.  Now remains on milrinone 0.25 - Co-ox 75%, - Decrease milrinone to 0.125  - No ACE/ARB?ARNI or b-blocker yet with AKI and shock  3. Acute hypoxic respiratory failure - CCM following.  - Suspect combination of HF and possible aspiration PNA. PCT 1.08. - Oxygenation much improved with CVVHD. Still volume overloaded. CVP 9-10 Will continue to pull. - CXR still with diffuse infiltrates but improving slightly. Repeat CXR in am - D/w CCM and patient's daughter at bedside. Will keep intubated and on CVVHD overweekend and reassess Monday for possible extubation vs trach. Family hesitant about trach  4. AKI:  -Suspect hemodynamic (ATN)   - CVVHD started 9/3 - Continue yo pull as above. Appreciate Nephrology's assistance  5. ID: Continues with low grade fevers - ? Aspiration PNA and sinus disease (seen on CT) - On zosyn. Remains febrile with periods of hypothermia. WBC up to 27.2k/ Unclear source. Sputum cx negative - Will redraw bcx - Start meropenem and vanc (d/w CCM) to prevent recurrent sepsis p - Consider fungal coverage  5. Cardiac arrest/VF: 20 minutes downtime, concerned for anoxic brain injury.  Neurology following, per yesterday's note he seems to have intact brainstem function but minimal cortical function. EEG suggestive of diffuse encephalopathy. CT head without definite evidence for anoxic encephalopathy.  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 Situation now complicated by worsening renal and respiratory function  - Neuro status improving - small CVA on MRI. Echo on 9/1 with Definity -> no LV thrombus.Hold off on systemic AC at this time  7. Anemia:  - Transfuse as needed. Keep hgb  >= 7.5. Hgb 8.8 today  8. Elevated LFTs: Suspect shock liver, improving.   9. DVT prophylaxis: on heparin for CVVHD. Discussed dosing with PharmD personally.  10. Urinary retention - Foley  11. Hypernatremia. - resolved   CRITICAL CARE Performed by: Glori Bickers  Total critical care time: 40 minutes  Critical care time was exclusive of separately billable procedures and treating other patients.  Critical care was necessary to treat or prevent imminent or life-threatening deterioration.  Critical care was time spent personally by me (independent of midlevel providers or residents) on the following activities: development of treatment plan with patient and/or surrogate as well as nursing, discussions with consultants, evaluation of patient's response to treatment, examination of patient, obtaining history from patient or surrogate, ordering and performing treatments and interventions, ordering and review of laboratory studies, ordering and review of radiographic studies, pulse oximetry and re-evaluation of patient's condition.   Glori Bickers MD 04/28/2019 11:44 AM

## 2019-04-29 ENCOUNTER — Inpatient Hospital Stay (HOSPITAL_COMMUNITY): Payer: PPO

## 2019-04-29 DIAGNOSIS — Z9911 Dependence on respirator [ventilator] status: Secondary | ICD-10-CM

## 2019-04-29 LAB — RENAL FUNCTION PANEL
Albumin: 2 g/dL — ABNORMAL LOW (ref 3.5–5.0)
Albumin: 2 g/dL — ABNORMAL LOW (ref 3.5–5.0)
Anion gap: 10 (ref 5–15)
Anion gap: 9 (ref 5–15)
BUN: 44 mg/dL — ABNORMAL HIGH (ref 8–23)
BUN: 44 mg/dL — ABNORMAL HIGH (ref 8–23)
CO2: 24 mmol/L (ref 22–32)
CO2: 23 mmol/L (ref 22–32)
Calcium: 8 mg/dL — ABNORMAL LOW (ref 8.9–10.3)
Calcium: 7.9 mg/dL — ABNORMAL LOW (ref 8.9–10.3)
Chloride: 101 mmol/L (ref 98–111)
Chloride: 101 mmol/L (ref 98–111)
Creatinine, Ser: 1.9 mg/dL — ABNORMAL HIGH (ref 0.61–1.24)
Creatinine, Ser: 1.91 mg/dL — ABNORMAL HIGH (ref 0.61–1.24)
GFR calc Af Amer: 41 mL/min — ABNORMAL LOW (ref >60)
GFR calc Af Amer: 41 mL/min — ABNORMAL LOW (ref >60)
GFR calc non Af Amer: 35 mL/min — ABNORMAL LOW (ref >60)
GFR calc non Af Amer: 35 mL/min — ABNORMAL LOW (ref >60)
Glucose, Bld: 163 mg/dL — ABNORMAL HIGH (ref 70–99)
Glucose, Bld: 169 mg/dL — ABNORMAL HIGH (ref 70–99)
Phosphorus: 4.5 mg/dL (ref 2.5–4.6)
Phosphorus: 3.9 mg/dL (ref 2.5–4.6)
Potassium: 4.6 mmol/L (ref 3.5–5.1)
Potassium: 5.5 mmol/L — ABNORMAL HIGH (ref 3.5–5.1)
Sodium: 135 mmol/L (ref 135–145)
Sodium: 133 mmol/L — ABNORMAL LOW (ref 135–145)

## 2019-04-29 LAB — POCT ACTIVATED CLOTTING TIME
Activated Clotting Time: 208 seconds
Activated Clotting Time: 213 seconds
Activated Clotting Time: 219 seconds
Activated Clotting Time: 224 seconds
Activated Clotting Time: 224 seconds
Activated Clotting Time: 213 seconds
Activated Clotting Time: 219 seconds
Activated Clotting Time: 213 seconds
Activated Clotting Time: 191 seconds
Activated Clotting Time: 191 seconds
Activated Clotting Time: 197 seconds

## 2019-04-29 LAB — POCT I-STAT 7, (LYTES, BLD GAS, ICA,H+H)
Acid-Base Excess: 1 mmol/L (ref 0.0–2.0)
Bicarbonate: 26.9 mmol/L (ref 20.0–28.0)
Calcium, Ion: 1.16 mmol/L (ref 1.15–1.40)
HCT: 29 % — ABNORMAL LOW (ref 39.0–52.0)
Hemoglobin: 9.9 g/dL — ABNORMAL LOW (ref 13.0–17.0)
O2 Saturation: 99 %
Patient temperature: 35.9
Potassium: 4.7 mmol/L (ref 3.5–5.1)
Sodium: 138 mmol/L (ref 135–145)
TCO2: 28 mmol/L (ref 22–32)
pCO2 arterial: 44.9 mmHg (ref 32.0–48.0)
pH, Arterial: 7.38 (ref 7.350–7.450)
pO2, Arterial: 115 mmHg — ABNORMAL HIGH (ref 83.0–108.0)

## 2019-04-29 LAB — GLUCOSE, CAPILLARY
Glucose-Capillary: 104 mg/dL — ABNORMAL HIGH (ref 70–99)
Glucose-Capillary: 133 mg/dL — ABNORMAL HIGH (ref 70–99)
Glucose-Capillary: 142 mg/dL — ABNORMAL HIGH (ref 70–99)
Glucose-Capillary: 158 mg/dL — ABNORMAL HIGH (ref 70–99)
Glucose-Capillary: 159 mg/dL — ABNORMAL HIGH (ref 70–99)
Glucose-Capillary: 176 mg/dL — ABNORMAL HIGH (ref 70–99)
Glucose-Capillary: 183 mg/dL — ABNORMAL HIGH (ref 70–99)
Glucose-Capillary: 183 mg/dL — ABNORMAL HIGH (ref 70–99)

## 2019-04-29 LAB — CBC
HCT: 28.5 % — ABNORMAL LOW (ref 39.0–52.0)
Hemoglobin: 8.8 g/dL — ABNORMAL LOW (ref 13.0–17.0)
MCH: 30.7 pg (ref 26.0–34.0)
MCHC: 30.9 g/dL (ref 30.0–36.0)
MCV: 99.3 fL (ref 80.0–100.0)
Platelets: 630 10*3/uL — ABNORMAL HIGH (ref 150–400)
RBC: 2.87 MIL/uL — ABNORMAL LOW (ref 4.22–5.81)
RDW: 15.4 % (ref 11.5–15.5)
WBC: 30.7 10*3/uL — ABNORMAL HIGH (ref 4.0–10.5)
nRBC: 0.2 % (ref 0.0–0.2)

## 2019-04-29 LAB — SEDIMENTATION RATE: Sed Rate: 128 mm/hr — ABNORMAL HIGH (ref 0–16)

## 2019-04-29 LAB — COOXEMETRY PANEL
Carboxyhemoglobin: 1.4 % (ref 0.5–1.5)
Methemoglobin: 0.8 % (ref 0.0–1.5)
O2 Saturation: 76.3 %
Total hemoglobin: 8.8 g/dL — ABNORMAL LOW (ref 12.0–16.0)

## 2019-04-29 LAB — TRIGLYCERIDES: Triglycerides: 96 mg/dL (ref <150)

## 2019-04-29 LAB — MAGNESIUM: Magnesium: 2.8 mg/dL — ABNORMAL HIGH (ref 1.7–2.4)

## 2019-04-29 MED ORDER — PRISMASOL BGK 0/2.5 32-2.5 MEQ/L IV SOLN
INTRAVENOUS | Status: DC
  Administered 2019-04-29 – 2019-05-01 (×12): via INTRAVENOUS_CENTRAL
  Filled 2019-04-29 (×22): qty 5000

## 2019-04-29 MED ORDER — SODIUM CHLORIDE 0.9 % IV SOLN
100.0000 mg | INTRAVENOUS | Status: DC
  Administered 2019-04-30: 100 mg via INTRAVENOUS
  Filled 2019-04-29 (×2): qty 100

## 2019-04-29 MED ORDER — SODIUM CHLORIDE 0.9 % IV SOLN
200.0000 mg | Freq: Once | INTRAVENOUS | Status: AC
  Administered 2019-04-29: 200 mg via INTRAVENOUS
  Filled 2019-04-29: qty 200

## 2019-04-29 MED ORDER — METHYLPREDNISOLONE SODIUM SUCC 125 MG IJ SOLR
60.0000 mg | Freq: Four times a day (QID) | INTRAMUSCULAR | Status: DC
  Administered 2019-04-29 – 2019-05-01 (×7): 60 mg via INTRAVENOUS
  Filled 2019-04-29 (×7): qty 2

## 2019-04-29 NOTE — Progress Notes (Signed)
Addendum to rounding note:  Patient with rising white count and fevers. CXR has changed very little despite 20 pounds of volume removal. All cultures remain negative.   ESR now very high at 128. Concern for possible acute amio-induced pneumonitis. Amio stopped 2 days ago. Will try empiric steroids at solumedrol 60q6 watching closely for clinical worsening and hyperglycemia.   Glori Bickers, MD  6:11 PM

## 2019-04-29 NOTE — Progress Notes (Signed)
Patient ID: Lawrence Olson, male   DOB: 1950/10/20, 68 y.o.   [name] [id number]   Advanced Heart Failure Team  Note   Primary Physician: Patient, No Pcp Per PCP-Cardiologist:  No primary care provider on file.  HPI:    Remains intubated on CVVHD. CVP down to 9. Weight down 21 pounds and now back to baseline. CVP 4  Vent remains on 50%.  On milrinone 0.125. Off NE. Co-ox 76%   Sedated on propofol. Will awaken and seems to move arms on commands but not convincingly so.   Abx switched to vanc/meropenem yesterday. Tmax 100.0  Hypothermic at times WBC up to 17k -> 22.8k -> 27.2 -> 30k. Bcx redrawn   Echo 04/24/19 EF 25-30% no LV thrombus   Objective:    Vital Signs:   Temp:  [95.7 F (35.4 C)-100 F (37.8 C)] 97.9 F (36.6 C) (09/06 0900) Pulse Rate:  [77-115] 86 (09/06 0900) Resp:  [15-38] 17 (09/06 0900) BP: (94-132)/(55-69) 110/61 (09/06 0900) SpO2:  [93 %-100 %] 100 % (09/06 0900) Arterial Line BP: (112-149)/(49-66) 138/57 (09/06 0900) FiO2 (%):  [50 %-60 %] 50 % (09/06 0810) Weight:  [88.6 kg] 88.6 kg (09/06 0500) Last BM Date: 04/29/19  Weight change: Filed Weights   04/26/19 0657 04/28/19 0500 04/29/19 0500  Weight: 98 kg 91.4 kg 88.6 kg    Intake/Output:   Intake/Output Summary (Last 24 hours) at 04/29/2019 0948 Last data filed at 04/29/2019 0900 Gross per 24 hour  Intake 2093.44 ml  Output 7180 ml  Net -5086.56 ml      Physical Exam   General:  Intubated/sedated HEENT: normal + ETT Neck: supple. RIJ trialysis. LIJ TLC . Carotids 2+ bilat; no bruits. No lymphadenopathy or thryomegaly appreciated. Cor: PMI nondisplaced. Regular rate & rhythm. No rubs, gallops or murmurs. Lungs: coarse Abdomen: obese soft, nontender, nondistended. No hepatosplenomegaly. No bruits or masses. Good bowel sounds. Extremities: no cyanosis, clubbing, rash, edema Neuro: intubated/sedated   Telemetry   NSR 90-105s Personally reviewed  Labs   Basic Metabolic Panel: Recent Labs   Lab 04/24/19 0335 04/25/19 0413  04/27/19 0240  04/27/19 1536 04/28/19 0319 04/28/19 0346 04/28/19 1506 04/29/19 0327 04/29/19 0337  NA 142 148*   < > 145   < > 142 139 141 138 135 138  K 3.4* 3.4*   < > 3.9   < > 4.1 4.0 4.0 5.0 4.6 4.7  CL 104 108   < > 110  --  106 104  --  104 101  --   CO2 26 28   < > 26  --  25 26  --  23 24  --   GLUCOSE 343* 133*   < > 195*  --  140* 163*  --  128* 163*  --   BUN 88* 98*   < > 88*  --  61* 51*  --  47* 44*  --   CREATININE 2.65* 3.01*   < > 2.80*  --  2.14* 1.86*  --  1.84* 1.90*  --   CALCIUM 8.0* 7.9*   < > 7.6*  --  7.8* 7.8*  --  8.1* 8.0*  --   MG 2.4 2.5*  --  2.7*  --   --  2.8*  --   --  2.8*  --   PHOS 2.9 3.0   < > 3.7  --  3.3 4.1  --  3.7 4.5  --    < > = values in  this interval not displayed.    Liver Function Tests: Recent Labs  Lab 04/27/19 0240 04/27/19 1536 04/28/19 0319 04/28/19 1506 04/29/19 0327  ALBUMIN 1.7* 1.9* 1.8* 2.1* 2.0*   No results for input(s): LIPASE, AMYLASE in the last 168 hours. Recent Labs  Lab 04/23/19 0930  AMMONIA 40*    CBC: Recent Labs  Lab 04/25/19 0413  04/26/19 0445  04/27/19 0240 04/27/19 1147 04/28/19 0319 04/28/19 0346 04/29/19 0327 04/29/19 0337  WBC 15.3*  --  17.1*  --  22.8*  --  27.2*  --  30.7*  --   HGB 8.0*   < > 8.2*   < > 8.2* 8.8* 8.3* 8.8* 8.8* 9.9*  HCT 26.0*   < > 26.6*   < > 27.1* 26.0* 27.1* 26.0* 28.5* 29.0*  MCV 98.1  --  100.0  --  99.6  --  98.9  --  99.3  --   PLT 388  --  467*  --  519*  --  551*  --  630*  --    < > = values in this interval not displayed.    Cardiac Enzymes: No results for input(s): CKTOTAL, CKMB, CKMBINDEX, TROPONINI in the last 168 hours.  BNP: BNP (last 3 results) No results for input(s): BNP in the last 8760 hours.  ProBNP (last 3 results) No results for input(s): PROBNP in the last 8760 hours.   CBG: Recent Labs  Lab 04/28/19 1613 04/28/19 1939 04/28/19 2343 04/29/19 0011 04/29/19 0334  GLUCAP 125* 133*  119* 142* 158*    Coagulation Studies: Recent Labs    04/27/19 0240  LABPROT 15.2  INR 1.2     Imaging   Dg Chest Port 1 View  Result Date: 04/29/2019 CLINICAL DATA:  Hypoxia. EXAM: PORTABLE CHEST 1 VIEW COMPARISON:  04/27/2019 FINDINGS: Endotracheal tube is 3.7 cm above the carina. Evidence for non tunneled dialysis catheter with the tip in the SVC region. Heart size is upper limits of normal and stable. Again noted are densities in the retrocardiac region. Patchy airspace densities, right side greater than left. Left jugular central line is in the upper SVC region. Negative for a pneumothorax. Nasogastric tube extends into the abdomen. IMPRESSION: Minimal change in the patchy bilateral airspace disease. Findings are suggestive for asymmetric edema or infection. Stable appearance of the support apparatuses. Electronically Signed   By: Markus Daft M.D.   On: 04/29/2019 09:19     Medications:     Current Medications: .  stroke: mapping our early stages of recovery book   Does not apply Once  . sodium chloride   Intravenous Once  . aspirin  81 mg Oral Daily  . atorvastatin  80 mg Per Tube q1800  . B-complex with vitamin C  1 tablet Oral Daily  . chlorhexidine gluconate (MEDLINE KIT)  15 mL Mouth Rinse BID  . Chlorhexidine Gluconate Cloth  6 each Topical Daily  . clonazePAM  0.5 mg Per Tube BID  . famotidine  20 mg Per Tube Daily  . feeding supplement (PRO-STAT SUGAR FREE 64)  30 mL Per Tube BID  . heparin injection (subcutaneous)  5,000 Units Subcutaneous Q8H  . insulin aspart  1-3 Units Subcutaneous Q4H  . insulin aspart  7 Units Subcutaneous Q4H  . insulin detemir  35 Units Subcutaneous Q12H  . mouth rinse  15 mL Mouth Rinse 10 times per day  . potassium chloride  40 mEq Oral Daily  . QUEtiapine  100 mg Oral BID  .  sodium chloride flush  10 mL Intravenous Q12H    Infusions: .  prismasol BGK 4/2.5 500 mL/hr at 04/29/19 0417  .  prismasol BGK 4/2.5 300 mL/hr at 04/28/19 1921   . sodium chloride 10 mL/hr at 04/29/19 0700  . dextrose    . feeding supplement (VITAL AF 1.2 CAL) 65 mL/hr at 04/29/19 0700  . heparin 10,000 units/ 20 mL infusion syringe 1,750 Units/hr (04/29/19 0408)  . meropenem (MERREM) IV Stopped (04/28/19 2135)  . milrinone 0.125 mcg/kg/min (04/29/19 0900)  . prismasol BGK 4/2.5 1,500 mL/hr at 04/29/19 0408  . propofol (DIPRIVAN) infusion 30 mcg/kg/min (04/29/19 0900)  . vancomycin      Assessment/Plan   1. CAD: Anterolateral STEMI, cath 8/24 showed occluded proximal LAD, unable to open.  Once we see how he recovers neurologically, will need to decide on feasibility of re-attempt PCI versus CABG. Medical therapy for the forseeable future. - Continue ASA + statin.   2. Cardiogenic shock: Ischemic cardiomyopathy.   - Echo with EF 25%, normal RV.  Impella removed 8/28.  Now remains on milrinone 0.25 - Co-ox 76% on milrinone 0.125. Can stop - No ACE/ARB?ARNI or b-blocker yet with AKI and shock  3. Acute hypoxic respiratory failure - CCM following.  - Suspect combination of HF and possible aspiration PNA. PCT 1.08. - Oxygenation much improved with CVVHD. Volume status now [facility] better. CVP 4.  - CXR still with diffuse infiltrates but clearly improved. - Will run CVVHD even - D/w CCM and patient's daughter at bedside. Will keep intubated and on CVVHD overweekend and reassess tomorrow for possible extubation vs trach. Family hesitant about trach  4. AKI:  -Suspect hemodynamic (ATN)   - CVVHD started 9/3 - Volume status now [facility] better. CVP 4. Run CVVHD even  5. ID: Continues with low grade fevers - ? Aspiration PNA and sinus disease (seen on CT) - ABX changed yesterday from zosyn -> meropenem and vanc. WBC up to 30k  Unclear source. Sputum cx negative. Bcx redrawn yesterday. Will add fungal coverage - Does not appear to have c.diff  - Consider ID consult in am  5. Cardiac arrest/VF: 20 minutes downtime, concerned for anoxic brain injury.   Neurology following, per yesterday's note he seems to have intact brainstem function but minimal cortical function. EEG suggestive of diffuse encephalopathy. CT head without definite evidence for anoxic encephalopathy.  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 Situation now complicated by worsening renal and respiratory function  - Neuro status improving. Wake-up assessment tomorrow - small CVA on MRI. Echo on 9/1 with Definity -> no LV thrombus.Hold off on systemic AC at this time  7. Anemia:  - Transfuse as needed. Keep hgb >= 7.5. Hgb 8.8 today  8. Elevated LFTs: Suspect shock liver, improving.   9. DVT prophylaxis: on heparin for CVVHD. Discussed dosing with PharmD personally.  10. Urinary retention - Foley  11. Hypernatremia. - resolved   CRITICAL CARE Performed by: Glori Bickers  Total critical care time: 35 minutes  Critical care time was exclusive of separately billable procedures and treating other patients.  Critical care was necessary to treat or prevent imminent or life-threatening deterioration.  Critical care was time spent personally by me (independent of midlevel providers or residents) on the following activities: development of treatment plan with patient and/or surrogate as well  as nursing, discussions with consultants, evaluation of patient's response to treatment, examination of patient, obtaining history from patient or surrogate, ordering and performing treatments and interventions, ordering and review of laboratory studies, ordering and review of radiographic studies, pulse oximetry and re-evaluation of patient's condition.   Glori Bickers MD 04/29/2019 9:48 AM

## 2019-04-29 NOTE — Progress Notes (Signed)
NAMETalik Olson, [name]  [id number], DOB:  [birth year], LOS: 0 ADMISSION DATE:  04/16/2019, CONSULTATION DATE:  04/16/2019 REFERRING MD:  Claiborne Billings CHIEF COMPLAINT:  STEMI, resp failure  Brief History   68yo M out of hospital cardiac arrest with ROSC, intubated, found to have STEMI, taken emergently to cath lab. Proximal LAD lesion. PCCM consulted for management assistance.   Past Medical History    has a past surgical history that includes RIGHT HEART CATH AND CORONARY ANGIOGRAPHY (N/A, 04/16/2019) and VENTRICULAR ASSIST DEVICE INSERTION (N/A, 04/16/2019).  Significant Hospital Events   8/24: Cath Lab. PTCI unsuccessful despite multiple attempts.  RA 13 RV 42/16 PA 40/26 (31) PCWP 18 Pa sat 50%  Fick 2.7/1.3 CPO 0.6 Impella placed. VT /p same requiring defib.   8/27: Remains comatose with decorticate posturing.  Still on Impella support.  Weaning vasoactive drips.  At this point major question is neurological recovery.  Would need further cardiac intervention if he were to recover neurologically.  Neurology has been consulted. EEG - negative  8/28 - Impellla removed. Restart abx due to fevers  8/29 -  Daughter says he moves extremities in responds to [place] language.   8/30- Desats with mucus plugs, moving his head towards voice and opeening to voice and trying to focus  8/31 Bronchoscopy to eval for mucus plugs- Clean airways noted.   9/3 Dialysis cath placed. CRRT started  Consults:  Cardiology PCCM Heart failure  Nephrology  Procedures:  8/24: PTCI unsuccessful 8/14: Impella L groin>> 8/24: 7.5 ETT>> 8/24: L IJ CVC >>> 8/28: Rt radial A line 8/31: Bronch with BAL Significant Diagnostic Tests:  Echocardiogram 8/27: Well-positioned Impella cannula.  Continued severely depressed LV function with anteroapical wall motion abnormality.    Echo 04/24/2019- severely depressed left ventricular systolic function with   global hypokinesis and anteroapical dyskinesis. LVEF  25-30%.  EEG 8/27: severe, diffuse encephalopathy without specific etiology  MRI brain 04/23/2019- punctate infarct in the caudate head.  No clear evidence of global hypoxic ischemic injury.   CXR 04/24/19: BILATERAL diffuse pulmonary infiltrates consistent with      pneumonia, perhaps slightly improved in the upper lobe  CXR 04/26/19: no interval change in interstitial and airspace opacities. Small pleural effusions present.  CXR 04/27/19: No significant interval change  Renal US 04/26/19: neg for hydronephrosis or stone Micro Data:  COVID-19: Negative 8/24 blood cultures x2>>> negative Urine culture 8/27:>>> Negative MRSA swab: Positive BAL 8/31 > no growth Blood culture 829 > NG, final Respiratory culture 9/4 >> rare GPC >> resp culture 9/5 >> Blood cultures 9/5 >>  Antimicrobials:  cefepime from 8/24>8/30 Vancomycin 8/25>9/1, restarted 9/5 Zosyn  8/30 >9/5 Meropenem 9/5 >>  Interim history/subjective:  He remains intubated and sedated.  Sedation being weaned down.  His wife is at bedside.  Objective    Today's Vitals   04/29/19 0800 04/29/19 0810 04/29/19 0900 04/29/19 1000  BP: 101/64 (!) 132/55 110/61 118/63  Pulse: 83 84 86 89  Resp: _0 Temp: (!) 97.3 F (36.3 C)  97.9 F (36.6 C) 98.4 F (36.9 C)  TempSrc: Esophageal  Esophageal Esophageal  SpO2: 100% 99% 100% 100%  Weight:      Height:      PainSc:       Vent Mode: CPAP;PSV FiO2 (%):  [50 %-60 %] 50 % Set Rate:  [21 bmp] 21 bmp Vt Set:  [510 mL] 510 mL PEEP:  [5 cmH20-8 cmH20] 8 cmH20 Pressure Support:  [  Lucas Pressure:  [18 cmH20-23 cmH20] 22 cmH20  CVP:  [3 mmHg-8 mmHg] 4 mmHg  Examination: Gen:   Elderly man lying in bed, intubated and sedated.   HEENT: Cephalic, atraumatic, eyes anicteric Neck:    L IJ CVC, RIJ trialysis lungs:    Clear to auscultation bilaterally, minimal secretions from ET tube. CV:         Regular rate and rhythm Abd: Soft, nontender,  nondistended, hypoactive bowel sounds.  FMS in place Ext: Warm, dry, no pitting edema.  No wounds. Skin:    No rashes or erythema.   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  Patient was taken to the cath lab and found to have 100% proximal LAD lesion. PCI unsuccessful so impella placed (removed 8/28).    Acute systolic heart failure and cardiogenic shock status post cardiac arrest/ VF-due to anterior STEMI- 100% prox LAD, unable to be revascularized.  EF 25-30% with significant wall motion abnormalities.  Plan -Continue dual antiplatelet therapy -Discontinue milrinone per cardiology today  Acute hypoxic respiratory failure -pulmonary edema secondary to MI, cardiac arrest, cardiogenic shock vs infection.  Hypothermic with increasing leukocytosis.  Concerning for sepsis.  Source unknown, but thick sputum raises concern for VAP. Plan -Daily SAT and SBT as tolerated.  Working towards extubation soon.  If this is not able to happen, we need to have discussions regarding plans for palliation versus trach and PEG. -Continue lung protective ventilation, goal plateau less than 30.  Titrate down FiO2 as able to maintain SPO2 greater than 90%. - Continue to monitor cultures   AKI - ATN due to shock. Nonoliguric. CRRT started 9/3. Improvement in renal labs No significant findings on renal US yesterday Plan -Continue CRRT per nephrology -Continue to monitor I/O -Avoid nephrotoxic medications  Acute encephalopathy - post cardiac arrest.  MRI not convincing for anoxic brain injury.  Puntuate infarct of right caudal head. No findings on echo consistent with cardiogenic source.  -Delirium precautions -Daily SAT - His wife  will work with him today to determine his ability to comprehend and follow commands   Hyperglycemia. Transitioned to subcut insulin yesterday.  BG better controlled, but requiring 50 units of sliding scale insulin on top of 60 to 70 units of long-acting insulin. Plan -Continue detemir 35 units twice daily.  If he is extubated, this should still be given, because even without tube feeds he seems to have very high insulin requirements, which we are currently meeting with sliding scale insulin. -Continue sliding scale insulin PRN -Goal blood glucose 140-180 while admitted to the ICU  Anemia of critical illness and repeat lab draws. Hgb stable.  -Continue to monitor - Transfuse for hemoglobin less than 7 or hemodynamic instability associated with active bleeding  Best practice Sedation: PAD protocol. Klonapin 0.64m bid, versed PRN, Seroquel, propofol.  Prefer to avoid benzodiazepines. Nutrition: tubefeeds GI prophylaxis: pepcid DVT prophylaxis:Lovenox Glycemic control: SSI + detemir  Lines: A-line, L IJ CVC, RIJ paralysis, ET tube, gastric tube, Foley, rectal tube Family: Wife updated at bedside during rounds  This patient is critically ill with multiple organ system failure which requires frequent high complexity  decision making, assessment, support, evaluation, and titration of therapies. This was completed through the application of advanced monitoring technologies and extensive interpretation of multiple databases. During this encounter critical care time was devoted to patient care services described in this note for 40 minutes.  Julian Hy, DO 04/29/19 11:53 AM [facility]

## 2019-04-29 NOTE — Progress Notes (Addendum)
[facility] NEPHROLOGY PROGRESS NOTE  Assessment/ Plan: Pt is a 68 y.o. yo male with cardiac arrest, downtime 20 minutes, found to have anterolateral STEMI subsequent cardiac cath showed proximal LAD occluded which was unable to obtain.  Patient developed cardiogenic shock consulted for AKI and fluid management.   #Acute kidney injury likely ATN in the setting of cardiac arrest and cardiogenic shock concomitant with IV contrast and overdiuresis: -Started CRRT on 9/3 for fluid management.  Right IJ catheter placed by PCCM on 9/3.  Nonoliguric and tolerating UF well.  CVP and respiratory parameters improved therefore lower UF rate 100 cc an hour.  On IV heparin and 4K bath.  Discussed with ICU nurse.  Plan to continue CRRT.  Patient is only on milrinone today. -UA and kidney ultrasound unremarkable.  #Cardiac arrest, CAD with anterolateral STEMI: Occluded proximal LAD unable to open.  Cardiology is following.  #Cardiogenic shock: EF 25%, Impella removed on 8/28.  Currently on milrinone.    #Acute respiratory failure with hypoxia: On vent.    Respiratory parameters improving.  FiO2 50. Per PCCM.  #Anemia: Transfuse as needed.  Subjective: Seen and examined in ICU.  Tolerating CRRT well.  No issue with filter or catheter.  Urine output around 770 cc.  No new event. Objective Vital signs in last 24 hours: Vitals:   04/29/19 0700 04/29/19 0715 04/29/19 0800 04/29/19 0810  BP: 106/61  101/64 (!) 132/55  Pulse: 81 80 83 84  Resp: (!) 38 (!) '26 17 16  ' Temp: (!) 96.6 F (35.9 C) (!) 96.8 F (36 C) (!) 97.3 F (36.3 C)   TempSrc:   Esophageal   SpO2: 100% 100% 100% 99%  Weight:      Height:       Weight change: -2.8 kg  Intake/Output Summary (Last 24 hours) at 04/29/2019 0843 Last data filed at 04/29/2019 0800 Gross per 24 hour  Intake 2268.08 ml  Output 7172 ml  Net -4903.92 ml       Labs: Basic Metabolic Panel: Recent Labs  Lab 04/28/19 0319  04/28/19 1506  04/29/19 0327 04/29/19 0337  NA 139   < > 138 135 138  K 4.0   < > 5.0 4.6 4.7  CL 104  --  104 101  --   CO2 26  --  23 24  --   GLUCOSE 163*  --  128* 163*  --   BUN 51*  --  47* 44*  --   CREATININE 1.86*  --  1.84* 1.90*  --   CALCIUM 7.8*  --  8.1* 8.0*  --   PHOS 4.1  --  3.7 4.5  --    < > = values in this interval not displayed.   Liver Function Tests: Recent Labs  Lab 04/28/19 0319 04/28/19 1506 04/29/19 0327  ALBUMIN 1.8* 2.1* 2.0*   No results for input(s): LIPASE, AMYLASE in the last 168 hours. Recent Labs  Lab 04/23/19 0930  AMMONIA 40*   CBC: Recent Labs  Lab 04/25/19 0413  04/26/19 0445  04/27/19 0240  04/28/19 0319 04/28/19 0346 04/29/19 0327 04/29/19 0337  WBC 15.3*  --  17.1*  --  22.8*  --  27.2*  --  30.7*  --   HGB 8.0*   < > 8.2*   < > 8.2*   < > 8.3* 8.8* 8.8* 9.9*  HCT 26.0*   < > 26.6*   < > 27.1*   < > 27.1* 26.0* 28.5*  29.0*  MCV 98.1  --  100.0  --  99.6  --  98.9  --  99.3  --   PLT 388  --  467*  --  519*  --  551*  --  630*  --    < > = values in this interval not displayed.   Cardiac Enzymes: No results for input(s): CKTOTAL, CKMB, CKMBINDEX, TROPONINI in the last 168 hours. CBG: Recent Labs  Lab 04/28/19 1613 04/28/19 1939 04/28/19 2343 04/29/19 0011 04/29/19 0334  GLUCAP 125* 133* 119* 142* 158*    Iron Studies: No results for input(s): IRON, TIBC, TRANSFERRIN, FERRITIN in the last 72 hours. Studies/Results: No results found.  Medications: Infusions: .  prismasol BGK 4/2.5 500 mL/hr at 04/29/19 0417  .  prismasol BGK 4/2.5 300 mL/hr at 04/28/19 1921  . sodium chloride 10 mL/hr at 04/29/19 0700  . dextrose    . feeding supplement (VITAL AF 1.2 CAL) 65 mL/hr at 04/29/19 0700  . heparin 10,000 units/ 20 mL infusion syringe 1,750 Units/hr (04/29/19 0408)  . meropenem (MERREM) IV Stopped (04/28/19 2135)  . milrinone 0.125 mcg/kg/min (04/29/19 0800)  . prismasol BGK 4/2.5 1,500 mL/hr at 04/29/19 0408  . propofol  (DIPRIVAN) infusion 30 mcg/kg/min (04/29/19 0800)  . vancomycin      Scheduled Medications: .  stroke: mapping our early stages of recovery book   Does not apply Once  . sodium chloride   Intravenous Once  . aspirin  81 mg Oral Daily  . atorvastatin  80 mg Per Tube q1800  . B-complex with vitamin C  1 tablet Oral Daily  . chlorhexidine gluconate (MEDLINE KIT)  15 mL Mouth Rinse BID  . Chlorhexidine Gluconate Cloth  6 each Topical Daily  . clonazePAM  0.5 mg Per Tube BID  . famotidine  20 mg Per Tube Daily  . feeding supplement (PRO-STAT SUGAR FREE 64)  30 mL Per Tube BID  . heparin injection (subcutaneous)  5,000 Units Subcutaneous Q8H  . insulin aspart  1-3 Units Subcutaneous Q4H  . insulin aspart  7 Units Subcutaneous Q4H  . insulin detemir  35 Units Subcutaneous Q12H  . mouth rinse  15 mL Mouth Rinse 10 times per day  . potassium chloride  40 mEq Oral Daily  . QUEtiapine  100 mg Oral BID  . sodium chloride flush  10 mL Intravenous Q12H    have reviewed scheduled and prn medications.  Physical Exam: General: Intubated, not in distress Heart:RRR, s1s2 nl, no rubs Lungs: Coarse breath sound bilateral Abdomen:soft, Non-tender, non-distended Extremities: Lower extremity edema significantly improved Dialysis Access: Right IJ temporary catheter placed by PCCM on 9/3.  Lawrence Olson 04/29/2019,8:43 AM  LOS: 13 days  Pager: [phone]

## 2019-04-30 ENCOUNTER — Inpatient Hospital Stay (HOSPITAL_COMMUNITY): Payer: PPO

## 2019-04-30 DIAGNOSIS — Z9289 Personal history of other medical treatment: Secondary | ICD-10-CM

## 2019-04-30 DIAGNOSIS — G9340 Encephalopathy, unspecified: Secondary | ICD-10-CM

## 2019-04-30 DIAGNOSIS — L8990 Pressure ulcer of unspecified site, unspecified stage: Secondary | ICD-10-CM | POA: Insufficient documentation

## 2019-04-30 LAB — RENAL FUNCTION PANEL
Albumin: 1.9 g/dL — ABNORMAL LOW (ref 3.5–5.0)
Albumin: 2.2 g/dL — ABNORMAL LOW (ref 3.5–5.0)
Anion gap: 11 (ref 5–15)
Anion gap: 9 (ref 5–15)
BUN: 48 mg/dL — ABNORMAL HIGH (ref 8–23)
BUN: 48 mg/dL — ABNORMAL HIGH (ref 8–23)
CO2: 24 mmol/L (ref 22–32)
CO2: 25 mmol/L (ref 22–32)
Calcium: 7.9 mg/dL — ABNORMAL LOW (ref 8.9–10.3)
Calcium: 8 mg/dL — ABNORMAL LOW (ref 8.9–10.3)
Chloride: 101 mmol/L (ref 98–111)
Chloride: 102 mmol/L (ref 98–111)
Creatinine, Ser: 2 mg/dL — ABNORMAL HIGH (ref 0.61–1.24)
Creatinine, Ser: 1.83 mg/dL — ABNORMAL HIGH (ref 0.61–1.24)
GFR calc Af Amer: 39 mL/min — ABNORMAL LOW (ref >60)
GFR calc Af Amer: 43 mL/min — ABNORMAL LOW (ref >60)
GFR calc non Af Amer: 33 mL/min — ABNORMAL LOW (ref >60)
GFR calc non Af Amer: 37 mL/min — ABNORMAL LOW (ref >60)
Glucose, Bld: 284 mg/dL — ABNORMAL HIGH (ref 70–99)
Glucose, Bld: 133 mg/dL — ABNORMAL HIGH (ref 70–99)
Phosphorus: 5.8 mg/dL — ABNORMAL HIGH (ref 2.5–4.6)
Phosphorus: 4.6 mg/dL (ref 2.5–4.6)
Potassium: 5.7 mmol/L — ABNORMAL HIGH (ref 3.5–5.1)
Potassium: 4.4 mmol/L (ref 3.5–5.1)
Sodium: 136 mmol/L (ref 135–145)
Sodium: 136 mmol/L (ref 135–145)

## 2019-04-30 LAB — COOXEMETRY PANEL
Carboxyhemoglobin: 1.5 % (ref 0.5–1.5)
Methemoglobin: 1.3 % (ref 0.0–1.5)
O2 Saturation: 57.9 %
Total hemoglobin: 8.9 g/dL — ABNORMAL LOW (ref 12.0–16.0)

## 2019-04-30 LAB — GLUCOSE, CAPILLARY
Glucose-Capillary: 133 mg/dL — ABNORMAL HIGH (ref 70–99)
Glucose-Capillary: 142 mg/dL — ABNORMAL HIGH (ref 70–99)
Glucose-Capillary: 145 mg/dL — ABNORMAL HIGH (ref 70–99)
Glucose-Capillary: 149 mg/dL — ABNORMAL HIGH (ref 70–99)
Glucose-Capillary: 156 mg/dL — ABNORMAL HIGH (ref 70–99)
Glucose-Capillary: 160 mg/dL — ABNORMAL HIGH (ref 70–99)
Glucose-Capillary: 162 mg/dL — ABNORMAL HIGH (ref 70–99)
Glucose-Capillary: 174 mg/dL — ABNORMAL HIGH (ref 70–99)
Glucose-Capillary: 180 mg/dL — ABNORMAL HIGH (ref 70–99)
Glucose-Capillary: 188 mg/dL — ABNORMAL HIGH (ref 70–99)
Glucose-Capillary: 188 mg/dL — ABNORMAL HIGH (ref 70–99)
Glucose-Capillary: 194 mg/dL — ABNORMAL HIGH (ref 70–99)
Glucose-Capillary: 196 mg/dL — ABNORMAL HIGH (ref 70–99)
Glucose-Capillary: 220 mg/dL — ABNORMAL HIGH (ref 70–99)
Glucose-Capillary: 225 mg/dL — ABNORMAL HIGH (ref 70–99)
Glucose-Capillary: 252 mg/dL — ABNORMAL HIGH (ref 70–99)
Glucose-Capillary: 265 mg/dL — ABNORMAL HIGH (ref 70–99)
Glucose-Capillary: 272 mg/dL — ABNORMAL HIGH (ref 70–99)
Glucose-Capillary: 276 mg/dL — ABNORMAL HIGH (ref 70–99)
Glucose-Capillary: 290 mg/dL — ABNORMAL HIGH (ref 70–99)

## 2019-04-30 LAB — MAGNESIUM: Magnesium: 3 mg/dL — ABNORMAL HIGH (ref 1.7–2.4)

## 2019-04-30 LAB — POCT ACTIVATED CLOTTING TIME
Activated Clotting Time: 202 seconds
Activated Clotting Time: 208 seconds
Activated Clotting Time: 213 seconds
Activated Clotting Time: 213 seconds
Activated Clotting Time: 219 seconds

## 2019-04-30 LAB — CULTURE, RESPIRATORY W GRAM STAIN

## 2019-04-30 LAB — CBC
HCT: 27.7 % — ABNORMAL LOW (ref 39.0–52.0)
Hemoglobin: 8.5 g/dL — ABNORMAL LOW (ref 13.0–17.0)
MCH: 30.4 pg (ref 26.0–34.0)
MCHC: 30.7 g/dL (ref 30.0–36.0)
MCV: 98.9 fL (ref 80.0–100.0)
Platelets: 667 10*3/uL — ABNORMAL HIGH (ref 150–400)
RBC: 2.8 MIL/uL — ABNORMAL LOW (ref 4.22–5.81)
RDW: 15.2 % (ref 11.5–15.5)
WBC: 27.4 10*3/uL — ABNORMAL HIGH (ref 4.0–10.5)
nRBC: 0.1 % (ref 0.0–0.2)

## 2019-04-30 MED ORDER — SODIUM CHLORIDE 0.9% FLUSH
10.0000 mL | Freq: Two times a day (BID) | INTRAVENOUS | Status: DC
  Administered 2019-04-30 (×2): 10 mL
  Administered 2019-04-30: 09:00:00 30 mL
  Administered 2019-05-01: 40 mL
  Administered 2019-05-02: 30 mL
  Administered 2019-05-02 – 2019-05-03 (×3): 10 mL
  Administered 2019-05-04: 30 mL
  Administered 2019-05-04: 10 mL

## 2019-04-30 MED ORDER — SODIUM CHLORIDE 0.9% FLUSH
10.0000 mL | INTRAVENOUS | Status: DC | PRN
  Administered 2019-05-03 (×2): 10 mL
  Filled 2019-04-30 (×2): qty 40

## 2019-04-30 MED ORDER — PRISMASOL BGK 0/2.5 32-2.5 MEQ/L IV SOLN
INTRAVENOUS | Status: DC
  Administered 2019-04-30 (×2): via INTRAVENOUS_CENTRAL
  Filled 2019-04-30 (×3): qty 5000

## 2019-04-30 MED ORDER — PRISMASOL BGK 0/2.5 32-2.5 MEQ/L IV SOLN
INTRAVENOUS | Status: DC
  Filled 2019-04-30: qty 5000

## 2019-04-30 MED ORDER — LEVETIRACETAM IN NACL 500 MG/100ML IV SOLN
500.0000 mg | Freq: Two times a day (BID) | INTRAVENOUS | Status: DC
  Administered 2019-04-30 – 2019-05-01 (×2): 500 mg via INTRAVENOUS
  Filled 2019-04-30 (×2): qty 100

## 2019-04-30 MED ORDER — FENTANYL 2500MCG IN NS 250ML (10MCG/ML) PREMIX INFUSION
0.0000 ug/h | INTRAVENOUS | Status: DC
  Administered 2019-04-30: 50 ug/h via INTRAVENOUS
  Filled 2019-04-30: qty 250

## 2019-04-30 MED ORDER — B COMPLEX-C PO TABS
1.0000 | ORAL_TABLET | Freq: Every day | ORAL | Status: DC
  Administered 2019-05-01: 1
  Filled 2019-04-30 (×2): qty 1

## 2019-04-30 MED ORDER — ASPIRIN 81 MG PO CHEW
81.0000 mg | CHEWABLE_TABLET | Freq: Every day | ORAL | Status: DC
  Administered 2019-05-01: 81 mg
  Filled 2019-04-30: qty 1

## 2019-04-30 MED ORDER — "THROMBI-PAD 3""X3"" EX PADS"
1.0000 | MEDICATED_PAD | Freq: Once | CUTANEOUS | Status: AC
  Administered 2019-04-30: 01:00:00 1 via TOPICAL
  Filled 2019-04-30: qty 1

## 2019-04-30 MED ORDER — LEVETIRACETAM IN NACL 1000 MG/100ML IV SOLN
1000.0000 mg | Freq: Once | INTRAVENOUS | Status: AC
  Administered 2019-04-30: 05:00:00 1000 mg via INTRAVENOUS
  Filled 2019-04-30: qty 100

## 2019-04-30 MED ORDER — INSULIN REGULAR(HUMAN) IN NACL 100-0.9 UT/100ML-% IV SOLN
INTRAVENOUS | Status: DC
  Administered 2019-04-30: 04:00:00 2.1 [IU]/h via INTRAVENOUS
  Administered 2019-04-30: 14:00:00 11.5 [IU]/h via INTRAVENOUS
  Administered 2019-05-01: 14.6 [IU]/h via INTRAVENOUS
  Filled 2019-04-30 (×4): qty 100

## 2019-04-30 NOTE — Procedures (Signed)
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  O: 09/06 0701 - 09/07 0700 In: 2198.6 [I.V.:696.9; NG/GT:940; IV Piggyback:561.7] Out: 3522 [Urine:740; Stool:220]  Filed Weights   04/28/19 0500 04/29/19 0500 04/30/19 0357  Weight: 91.4 kg 88.6 kg 86.5 kg    Recent Labs  Lab 04/29/19 0327 04/29/19 0337 04/29/19 1620 04/30/19 0258  NA 135 138 133* 136  K 4.6 4.7 5.5* 5.7*  CL 101  --  101 101  CO2 24  --  23 24  GLUCOSE 163*  --  169* 284*  BUN 44*  --  44* 48*  CREATININE 1.90*  --  1.91* 2.00*  CALCIUM 8.0*  --  7.9* 7.9*  PHOS 4.5  --  3.9 5.8*   Recent Labs  Lab 04/28/19 0319  04/29/19 0327 04/29/19 0337 04/30/19 0258  WBC 27.2*  --  30.7*  --  27.4*  HGB 8.3*   < > 8.8* 9.9* 8.5*  HCT 27.1*   < > 28.5* 29.0* 27.7*  MCV 98.9  --  99.3  --  98.9  PLT 551*  --  630*  --  667*   < > = values in this interval not displayed.    Scheduled Meds: .  stroke: mapping our early stages of recovery book   Does not apply Once  . aspirin  81 mg Oral Daily  . atorvastatin  80 mg Per Tube q1800  . B-complex with vitamin C  1 tablet Oral Daily  . chlorhexidine gluconate (MEDLINE KIT)  15 mL Mouth Rinse BID  . Chlorhexidine Gluconate Cloth  6 each Topical Daily  . clonazePAM  0.5 mg Per Tube BID  . famotidine  20 mg Per Tube Daily  . feeding supplement (PRO-STAT SUGAR FREE 64)  30 mL Per Tube BID  . heparin injection (subcutaneous)  5,000  Units Subcutaneous Q8H  . mouth rinse  15 mL Mouth Rinse 10 times per day  . methylPREDNISolone (SOLU-MEDROL) injection  60 mg Intravenous Q6H  . QUEtiapine  100 mg Oral BID  . sodium chloride flush  10 mL Intravenous Q12H  . sodium chloride flush  10-40 mL Intracatheter Q12H   Continuous Infusions: .  prismasol BGK 4/2.5 500 mL/hr at 04/30/19 0208  . sodium chloride Stopped (04/30/19 0736)  . anidulafungin    . feeding supplement (VITAL AF 1.2 CAL) 65 mL/hr at 04/29/19 2000  . heparin 10,000 units/ 20 mL infusion syringe 1,750 Units/hr (04/30/19 0756)  . insulin 9.6 mL/hr at 04/30/19 0800  . levETIRAcetam    . meropenem (MERREM) IV Stopped (04/29/19 2141)  . prismasol BGK 2/2.5 dialysis solution 1,500 mL/hr at 04/30/19 0811  . prismasol BGK 2/2.5 replacement solution 300 mL/hr at 04/30/19 0634  . propofol (DIPRIVAN) infusion Stopped (04/30/19 0732)  . vancomycin Stopped (04/29/19 1450)   PRN Meds:.sodium chloride, fentaNYL (SUBLIMAZE) injection, heparin, heparin, hydrALAZINE, levalbuterol, midazolam, ondansetron (ZOFRAN) IV, sodium chloride flush, sodium chloride flush  ABG    Component Value Date/Time   PHART  7.380 04/29/2019 0337   PCO2ART 44.9 04/29/2019 0337   PO2ART 115.0 (H) 04/29/2019 0337   HCO3 26.9 04/29/2019 0337   TCO2 28 04/29/2019 0337   ACIDBASEDEF 5.0 (H) 04/17/2019 0620   O2SAT 57.9 04/30/2019 0440    A/P  1. Dialysis dependent ischemic ATN, on CRRT: Hyperkalemia this morning, changed to 2K bath overnight for dialysate and post replacement fluids.  Tolerating net even ultrafiltration off pressors, not on inotropes.  Close to transitioning to intermittent HD.  Continue HD on current settings 2. Status post cardiac arrest, STEMI with lesion not amenable to PCI, ischemic cardiomyopathy with systolic heart failure and shock.  As above.  Advanced heart failure following. 3. 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 Anemia, hemoglobin 8.5, stable   Pearson Grippe, MD [employer]

## 2019-04-30 NOTE — Progress Notes (Signed)
STAT EEG complete - results pending. Neuro notified.  

## 2019-04-30 NOTE — Progress Notes (Addendum)
Neurology Progress Note   S:// Neurology service recalled for concern for posturing and hippus like activity. No frank seizure activity noted. Questionable whole-body shivering also noted this morning. Versed which was ordered as needed, was given.  Also loaded with Keppra 1 g IV. 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  Amiodarone was stopped 3 days ago.  On empiric steroids.  O:// Current vital signs: BP 114/68   Pulse 71   Temp 97.7 F (36.5 C) (Oral) Comment: 98.3 axillary  Resp 17   Ht '5\' 6"'  (1.676 m)   Wt 86.5 kg   SpO2 96%   BMI 30.78 kg/m  Vital signs in last 24 hours: Temp:  [96.2 F (35.7 C)-100 F (37.8 C)] 97.7 F (36.5 C) (09/07 0755) Pulse Rate:  [68-105] 71 (09/07 0700) Resp:  [13-29] 17 (09/07 0700) BP: (101-132)/(55-77) 114/68 (09/07 0700) SpO2:  [94 %-100 %] 96 % (09/07 0700) Arterial Line BP: (103-161)/(48-65) 127/53 (09/07 0700) FiO2 (%):  [40 %-50 %] 40 % (09/07 0342) Weight:  [86.5 kg] 86.5 kg (09/07 0357) General exam: Remains intubated, was sedated on propofol which was held for the exam. HEENT: Normocephalic atraumatic CVs: Regular rate rhythm Chest: Vented Extremities: Warm perfused Neurological exam Patient was sedated on propofol, propofol held for the exam.  Remains intubated. Opens eyes to voice.  Does not follow command but tracks the examiner. Questionable if he said no to questions but he has a language barrier-speaks [place]nian and difficult to execute video interpreter.  Waiting family for better exam. Cranial nerve examination: Pupils are equal round react light, blinks to threat from both sides, attempts to track the examiner, difficult ascertain facial symmetry. Motor exam: Exhibited some extension of the upper extremities while coughing but to noxious stimulation was able to withdraw all 4 meaningfully. Also was possibly able to squeeze fingers on both sides to command although that remains  questionable. Sensory exam: As above Coordination cannot be tested.  Medications  Current Facility-Administered Medications:  .   prismasol BGK 4/2.5 infusion, , CRRT, Continuous, Rosita Fire, MD, Last Rate: 500 mL/hr at 04/30/19 0208 .   stroke: mapping our early stages of recovery book, , Does not apply, Once, Greta Doom, MD .  0.9 %  sodium chloride infusion (Manually program via Guardrails IV Fluids), , Intravenous, Once, Bensimhon, Shaune Pascal, MD .  0.9 %  sodium chloride infusion, , Intravenous, PRN, Troy Sine, MD, Stopped at 04/30/19 [phone] .  [COMPLETED] anidulafungin (ERAXIS) 200 mg in sodium chloride 0.9 % 200 mL IVPB, 200 mg, Intravenous, Once, Stopped at 04/29/19 1202 **FOLLOWED BY** anidulafungin (ERAXIS) 100 mg in sodium chloride 0.9 % 100 mL IVPB, 100 mg, Intravenous, Q24H, Bensimhon, Shaune Pascal, MD .  aspirin chewable tablet 81 mg, 81 mg, Oral, Daily, Troy Sine, MD, 81 mg at 04/29/19 0951 .  atorvastatin (LIPITOR) tablet 80 mg, 80 mg, Per Tube, q1800, Larey Dresser, MD, 80 mg at 04/29/19 1806 .  B-complex with vitamin C tablet 1 tablet, 1 tablet, Oral, Daily, Troy Sine, MD, 1 tablet at 04/29/19 [phone] .  chlorhexidine gluconate (MEDLINE KIT) (PERIDEX) 0.12 % solution 15 mL, 15 mL, Mouth Rinse, BID, Bensimhon, Shaune Pascal, MD, 15 mL at 04/29/19 2000 .  Chlorhexidine Gluconate Cloth 2 % PADS 6 each, 6 each, Topical, Daily, Troy Sine, MD, 6 each at 04/30/19 0430 .  clonazePAM (KLONOPIN) tablet 0.5 mg, 0.5 mg, Per Tube, BID, Rigoberto Noel, MD, 0.5 mg at  04/29/19 2141 .  famotidine (PEPCID) 40 MG/5ML suspension 20 mg, 20 mg, Per Tube, Daily, Agarwala, Ravi, MD, 20 mg at 04/29/19 0952 .  feeding supplement (PRO-STAT SUGAR FREE 64) liquid 30 mL, 30 mL, Per Tube, BID, Agarwala, Ravi, MD, 30 mL at 04/29/19 2140 .  feeding supplement (VITAL AF 1.2 CAL) liquid 1,000 mL, 1,000 mL, Per Tube, Continuous, Troy Sine, MD, Last Rate: 65 mL/hr at 04/29/19  2000 .  fentaNYL (SUBLIMAZE) injection 25-100 mcg, 25-100 mcg, Intravenous, Q2H PRN, Chase Caller, Murali, MD, 100 mcg at 04/29/19 2033 .  heparin 10,000 units/ 20 mL infusion syringe, 250-3,000 Units/hr, CRRT, Continuous, Rosita Fire, MD, Last Rate: 3.5 mL/hr at 04/30/19 0756, 1,750 Units/hr at 04/30/19 0756 .  heparin bolus via infusion syringe 1,000 Units, 1,000 Units, CRRT, PRN, Rosita Fire, MD, 1,000 Units at 04/26/19 2015 .  heparin injection 1,000-6,000 Units, 1,000-6,000 Units, CRRT, PRN, Rosita Fire, MD, 4,200 Units at 04/26/19 1407 .  heparin injection 5,000 Units, 5,000 Units, Subcutaneous, Q8H, Troy Sine, MD, 5,000 Units at 04/30/19 0604 .  hydrALAZINE (APRESOLINE) injection 20 mg, 20 mg, Intravenous, BID PRN, Larey Dresser, MD, 20 mg at 04/26/19 0830 .  insulin regular, human (MYXREDLIN) 100 units/ 100 mL infusion, , Intravenous, Continuous, Troy Sine, MD, Last Rate: 9.6 mL/hr at 04/30/19 0800 .  levalbuterol (XOPENEX) nebulizer solution 0.63 mg, 0.63 mg, Nebulization, Q3H PRN, Anders Simmonds, MD .  levETIRAcetam (KEPPRA) IVPB 500 mg/100 mL premix, 500 mg, Intravenous, Q12H, Anders Simmonds, MD .  MEDLINE mouth rinse, 15 mL, Mouth Rinse, 10 times per day, Bensimhon, Shaune Pascal, MD, 15 mL at 04/30/19 3382 .  meropenem (MERREM) 1 g in sodium chloride 0.9 % 100 mL IVPB, 1 g, Intravenous, Q12H, Ronna Polio, RPH, Stopped at 04/29/19 2141 .  methylPREDNISolone sodium succinate (SOLU-MEDROL) 125 mg/2 mL injection 60 mg, 60 mg, Intravenous, Q6H, Bensimhon, Shaune Pascal, MD, 60 mg at 04/30/19 0606 .  midazolam (VERSED) injection 1-2 mg, 1-2 mg, Intravenous, Q2H PRN, Rigoberto Noel, MD, 2 mg at 04/30/19 0405 .  ondansetron (ZOFRAN) injection 4 mg, 4 mg, Intravenous, Q6H PRN, Troy Sine, MD .  prismasol BGK 2/2.5 dialysis solution, , CRRT, Continuous, Roney Jaffe, MD, Last Rate: 1,500 mL/hr at 04/30/19 0459 .  prismasol BGK 2/2.5 replacement  solution, , CRRT, Continuous, Roney Jaffe, MD, Last Rate: 300 mL/hr at 04/30/19 0634 .  propofol (DIPRIVAN) 1000 MG/100ML infusion, 5-80 mcg/kg/min, Intravenous, Titrated, Einar Grad, [facility], Stopped at 04/30/19 0732 .  QUEtiapine (SEROQUEL) tablet 100 mg, 100 mg, Oral, BID, Rigoberto Noel, MD, 100 mg at 04/29/19 2141 .  sodium chloride flush (NS) 0.9 % injection 10 mL, 10 mL, Intravenous, Q12H, Troy Sine, MD, 10 mL at 04/29/19 2215 .  sodium chloride flush (NS) 0.9 % injection 10 mL, 10 mL, Intravenous, PRN, Shelva Majestic A, MD .  sodium chloride flush (NS) 0.9 % injection 10-40 mL, 10-40 mL, Intracatheter, Q12H, Troy Sine, MD, 10 mL at 04/30/19 0138 .  sodium chloride flush (NS) 0.9 % injection 10-40 mL, 10-40 mL, Intracatheter, PRN, Troy Sine, MD .  [COMPLETED] vancomycin (VANCOCIN) 1,500 mg in sodium chloride 0.9 % 500 mL IVPB, 1,500 mg, Intravenous, Once, Stopped at 04/28/19 1700 **FOLLOWED BY** vancomycin (VANCOCIN) IVPB 1000 mg/200 mL premix, 1,000 mg, Intravenous, Q24H, Ronna Polio, RPH, Stopped at 04/29/19 1450 Labs CBC    Component Value Date/Time   WBC 27.4 (H) 04/30/2019 0258  RBC 2.80 (L) 04/30/2019 0258   HGB 8.5 (L) 04/30/2019 0258   HCT 27.7 (L) 04/30/2019 0258   PLT 667 (H) 04/30/2019 0258   MCV 98.9 04/30/2019 0258   MCH 30.4 04/30/2019 0258   MCHC 30.7 04/30/2019 0258   RDW 15.2 04/30/2019 0258   LYMPHSABS 7.2 (H) 04/16/2019 1335   MONOABS 0.8 04/16/2019 1335   EOSABS 0.0 04/16/2019 1335   BASOSABS 0.3 (H) 04/16/2019 1335    CMP     Component Value Date/Time   NA 136 04/30/2019 0258   K 5.7 (H) 04/30/2019 0258   CL 101 04/30/2019 0258   CO2 24 04/30/2019 0258   GLUCOSE 284 (H) 04/30/2019 0258   BUN 48 (H) 04/30/2019 0258   CREATININE 2.00 (H) 04/30/2019 0258   CALCIUM 7.9 (L) 04/30/2019 0258   PROT 5.9 (L) 04/22/2019 0248   ALBUMIN 1.9 (L) 04/30/2019 0258   AST 45 (H) 04/22/2019 0248   ALT 48 (H) 04/22/2019 0248   ALKPHOS  70 04/22/2019 0248   BILITOT 1.2 04/22/2019 0248   GFRNONAA 33 (L) 04/30/2019 0258   GFRAA 39 (L) 04/30/2019 0258   Imaging I have reviewed images in epic and the results pertinent to this consultation are: MRI of the brain done on 04/23/2019 with punctate acute right caudate infarction as well as questionable hypoxic ischemic cortical insult more so in the frontal and insular regions but not definitive.  MRI on that date, did not show a widespread definitive whole brain hypoxic ischemic insult pattern.  MRI was done about a week after cardiac arrest presentation.  Assessment: 68 year old man with improving encephalopathy status post cardiac arrest.  Neurology recalled for concern for seizure.  No definitive seizures per report but he has been loaded with Keppra again. MRI of the brain done about a week from the insult showed a caudate stroke and questionable frontal and insular hypoxic ischemic injury but no definite evidence of widespread anoxic brain injury. 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 Exam is rather difficult due to language barrier no family being present at the side.  Will need to be repeated during the day when family arrives. His exam, is stable or questionably mildly improved from 04/24/2019 where he is now having some movement in his hands to command with the caveat that the language barrier makes it difficult to assess and so does intubation.   Impression: Evaluate for anoxic brain injury Evaluate for underlying seizure activity   Recommendations: I would hold off on continuing any Keppra for right now until the EEG is done. If the EEG shows concern for electrographic abnormalities, will continue Keppra then. Continue supportive care per primary teams as you are I would agree with Dr. Cecil Cobbs recommendation that he would need time as he has been showing  improvement, although slow but no deterioration in neurological status and would continue to expect slow improvement. I will follow-up after the EEG and along with family would try to repeat an exam and further addend my recommendations.  Plan was discussed with Dr. Carlis Abbott at bedside.  -- Amie Portland, MD [facility] Pager: [phone] If 7pm to 7am, please call on call as listed on AMION.  CRITICAL CARE ATTESTATION Performed by: Amie Portland, MD Total critical care time: 30 minutes Critical care time was exclusive of separately billable procedures and treating other patients and/or supervising APPs/Residents/Students Critical care was necessary to treat or prevent imminent or  life-threatening deterioration due to hypoxic/anoxic brain injury  This patient is critically ill and at significant risk for neurological worsening and/or death and care requires constant monitoring. Critical care was time spent personally by me on the following activities: development of treatment plan with patient and/or surrogate as well as nursing, discussions with consultants, evaluation of patient's response to treatment, examination of patient, obtaining history from patient or surrogate, ordering and performing treatments and interventions, ordering and review of laboratory studies, ordering and review of radiographic studies, pulse oximetry, re-evaluation of patient's condition, participation in multidisciplinary rounds and medical decision making of high complexity in the care of this patient.    Addendum Patient examined with his daughter at bedside. He is able to follow commands consistently. Stat EEG showed left temporal cortical dysfunction as well as mild diffuse encephalopathy.  Due to the presence of sedating medications which might potentially confound the EEG findings, discussed with Dr. Hortense Ramal and recommended pursuing LTM.  If no seizures over the next 24 hours, will discontinue the LTM  EEG. Discussed with Dr. Carlis Abbott - PCCM - they will attempt to extubate the patient. I agree with the extubation trial.  If he feels, he needs supportive treatment and care with tracheostomy and PEG tube placement and might require long-term placement as he might show extremely slow improvement but it would be unfair to predict or say that he will not make any meaningful recovery going forward at this time.Marland Kitchen    -- Amie Portland, MD [facility] Pager: [phone] If 7pm to 7am, please call on call as listed on AMION.

## 2019-04-30 NOTE — Progress Notes (Addendum)
Patient ID: Lawrence Olson, male   DOB: Jun 04, 1951, 68 y.o.   [name] [id number]   Advanced Heart Failure Team  Note   Primary Physician: Patient, No Pcp Per PCP-Cardiologist:  No primary care provider on file.  HPI:    Remains intubated on CVVHD - keeping even  Started on steroids on 9/6 for possible pneumonitis (ESR 128/persistent diffuse lung infiltrates despite 20 pound volume removal)  Possible seizure last nigh. Neuro has seen and now undergoing EEG. Loaded Keppra. Sedation off   Vent down to 40%.  Weaning. Co-ox 57%  Off milrinone  On vanc/meropenem/fungal coverage  Remains hypothermic  WBC 17k -> 22.8k -> 27.2 -> 30k -> 27K. Cultures negative  Echo 04/24/19 EF 25-30% no LV thrombus   Objective:    Vital Signs:   Temp:  [96.2 F (35.7 C)-100 F (37.8 C)] 97.7 F (36.5 C) (09/07 0755) Pulse Rate:  [68-105] 80 (09/07 0807) Resp:  [13-29] 18 (09/07 0807) BP: (101-149)/(56-91) 149/56 (09/07 0807) SpO2:  [91 %-100 %] 91 % (09/07 0800) Arterial Line BP: (103-161)/(48-65) 151/56 (09/07 0800) FiO2 (%):  [40 %-50 %] 40 % (09/07 0808) Weight:  [86.5 kg] 86.5 kg (09/07 0357) Last BM Date: 04/30/19  Weight change: Filed Weights   04/28/19 0500 04/29/19 0500 04/30/19 0357  Weight: 91.4 kg 88.6 kg 86.5 kg    Intake/Output:   Intake/Output Summary (Last 24 hours) at 04/30/2019 0910 Last data filed at 04/30/2019 0900 Gross per 24 hour  Intake 2346.44 ml  Output 3417 ml  Net -1070.56 ml      Physical Exam   General:  Intubated/sedated HEENT: normal + ETT Neck: supple. RIJ trialyis LIJ TLC Carotids 2+ bilat; no bruits. No lymphadenopathy or thryomegaly appreciated. Cor: PMI nondisplaced. Regular rate & rhythm. No rubs, gallops or murmurs. Lungs: coarse Abdomen: obese soft, nontender, nondistended. No hepatosplenomegaly. No bruits or masses. Good bowel sounds. Extremities: no cyanosis, clubbing, rash, edema Neuro: intubated sedated    Telemetry   NSR 80-90s Personally  reviewed  Labs   Basic Metabolic Panel: Recent Labs  Lab 04/25/19 0413  04/27/19 0240  04/28/19 0319  04/28/19 1506 04/29/19 0327 04/29/19 0337 04/29/19 1620 04/30/19 0258  NA 148*   < > 145   < > 139   < > 138 135 138 133* 136  K 3.4*   < > 3.9   < > 4.0   < > 5.0 4.6 4.7 5.5* 5.7*  CL 108   < > 110   < > 104  --  104 101  --  101 101  CO2 28   < > 26   < > 26  --  23 24  --  23 24  GLUCOSE 133*   < > 195*   < > 163*  --  128* 163*  --  169* 284*  BUN 98*   < > 88*   < > 51*  --  47* 44*  --  44* 48*  CREATININE 3.01*   < > 2.80*   < > 1.86*  --  1.84* 1.90*  --  1.91* 2.00*  CALCIUM 7.9*   < > 7.6*   < > 7.8*  --  8.1* 8.0*  --  7.9* 7.9*  MG 2.5*  --  2.7*  --  2.8*  --   --  2.8*  --   --  3.0*  PHOS 3.0   < > 3.7   < > 4.1  --  3.7 4.5  --  3.9 5.8*   < > = values in this interval not displayed.    Liver Function Tests: Recent Labs  Lab 04/28/19 0319 04/28/19 1506 04/29/19 0327 04/29/19 1620 04/30/19 0258  ALBUMIN 1.8* 2.1* 2.0* 2.0* 1.9*   No results for input(s): LIPASE, AMYLASE in the last 168 hours. Recent Labs  Lab 04/23/19 0930  AMMONIA 40*    CBC: Recent Labs  Lab 04/26/19 0445  04/27/19 0240  04/28/19 0319 04/28/19 0346 04/29/19 0327 04/29/19 0337 04/30/19 0258  WBC 17.1*  --  22.8*  --  27.2*  --  30.7*  --  27.4*  HGB 8.2*   < > 8.2*   < > 8.3* 8.8* 8.8* 9.9* 8.5*  HCT 26.6*   < > 27.1*   < > 27.1* 26.0* 28.5* 29.0* 27.7*  MCV 100.0  --  99.6  --  98.9  --  99.3  --  98.9  PLT 467*  --  519*  --  551*  --  630*  --  667*   < > = values in this interval not displayed.    Cardiac Enzymes: No results for input(s): CKTOTAL, CKMB, CKMBINDEX, TROPONINI in the last 168 hours.  BNP: BNP (last 3 results) No results for input(s): BNP in the last 8760 hours.  ProBNP (last 3 results) No results for input(s): PROBNP in the last 8760 hours.   CBG: Recent Labs  Lab 04/30/19 0302 04/30/19 0440 04/30/19 0551 04/30/19 0638 04/30/19 0743   GLUCAP 265* 290* 276* 272* 252*    Coagulation Studies: No results for input(s): LABPROT, INR in the last 72 hours.   Imaging   No results found.   Medications:     Current Medications: .  stroke: mapping our early stages of recovery book   Does not apply Once  . aspirin  81 mg Oral Daily  . atorvastatin  80 mg Per Tube q1800  . B-complex with vitamin C  1 tablet Oral Daily  . chlorhexidine gluconate (MEDLINE KIT)  15 mL Mouth Rinse BID  . Chlorhexidine Gluconate Cloth  6 each Topical Daily  . clonazePAM  0.5 mg Per Tube BID  . famotidine  20 mg Per Tube Daily  . feeding supplement (PRO-STAT SUGAR FREE 64)  30 mL Per Tube BID  . heparin injection (subcutaneous)  5,000 Units Subcutaneous Q8H  . mouth rinse  15 mL Mouth Rinse 10 times per day  . methylPREDNISolone (SOLU-MEDROL) injection  60 mg Intravenous Q6H  . QUEtiapine  100 mg Oral BID  . sodium chloride flush  10 mL Intravenous Q12H  . sodium chloride flush  10-40 mL Intracatheter Q12H    Infusions: .  prismasol BGK 4/2.5 500 mL/hr at 04/30/19 0208  . sodium chloride Stopped (04/30/19 0736)  . anidulafungin    . feeding supplement (VITAL AF 1.2 CAL) 65 mL/hr at 04/29/19 2000  . heparin 10,000 units/ 20 mL infusion syringe 1,750 Units/hr (04/30/19 0756)  . insulin 9.6 mL/hr at 04/30/19 0900  . levETIRAcetam    . meropenem (MERREM) IV 1 g (04/30/19 0909)  . prismasol BGK 2/2.5 dialysis solution 1,500 mL/hr at 04/30/19 0811  . prismasol BGK 2/2.5 replacement solution 300 mL/hr at 04/30/19 0634  . propofol (DIPRIVAN) infusion Stopped (04/30/19 0732)  . vancomycin Stopped (04/29/19 1450)    Assessment/Plan   1. CAD: Anterolateral STEMI, cath 8/24 showed occluded proximal LAD, unable to open.  Once we see how he recovers neurologically, will need to decide on feasibility of  re-attempt PCI versus CABG. Medical therapy for the forseeable future. - Continue ASA + statin.   2. Cardiogenic shock: Ischemic  cardiomyopathy.   - Echo with EF 25%, normal RV.  Impella removed 8/28.  Now remains on milrinone 0.25 - Co-ox 57%Milrinone stopped yesterday. Will follow co-ox - No ACE/ARB?ARNI or b-blocker yet with AKI and shock  3. Acute hypoxic respiratory failure - CCM following.  - Suspect combination of HF and possible aspiration  - On broad spectrum abx and anti-fungal coverage. Will continue for now but suspect real issue may be pneumonitis  - Steroids started 9/6 for possible pneumonitis  (ESR 128/persistent diffuse lung infiltrates despite 20 pound volume removal). Amio stopped several days ago  - Weaning vent. Down to 40%. Repeat CXR in am - Volume status ok. Will run CVVHD even - D/w CCM and patient's daughter at bedside. Will keep intubated and on CVVHD overweekend and reassess tomorrow for possible extubation vs trach. Family hesitant about trach  4. AKI:  -Suspect hemodynamic (ATN)   - CVVHD started 9/3 - Volume status now [facility] better. Run CVVHD even - [place] changed to 2K to address hyperkalemia  5. ID: Continues with low grade fevers - ? Aspiration PNA and sinus disease (seen on CT) - On broad spectrum abx and anti-fungal coverage. Will continue for now but suspect real issue may be pneumonitis  - Steroids started 9/6 for possible pneumonitis  (ESR 128/persistent diffuse lung infiltrates despite 20 pound volume removal). Amio stopped several days ago  - Does not appear to have c.diff  - Consider ID consult as needed. If continues to improve with steroids and cx remain negative would stop abx  5. Cardiac arrest/VF: 20 minutes downtime, concerned for anoxic brain injury.  Neurology following, per yesterday's note he seems to have intact brainstem function but minimal cortical function. EEG suggestive of diffuse encephalopathy. CT head without definite evidence for anoxic encephalopathy.  MRI without convincing evidence of anoxic injury - Reports of patient moving purposefully for family  when they speak to him in his native language but I have not witnessed this convincingly - ? Seizure overnight. Neuro has seen again. EEG pending. Await their input - small CVA on MRI. Echo on 9/1 with Definity -> no LV thrombus.Hold off on systemic AC at this time  7. Anemia:  - Transfuse as needed. Keep hgb >= 7.5. Hgb 8.8 today  8. Elevated LFTs: Suspect shock liver, improving.   9. DVT prophylaxis: on heparin for CVVHD. Discussed dosing with PharmD personally.  10. Urinary retention - Foley  11. Hyperkalemia - Renal addressing     CRITICAL CARE Performed by: Glori Bickers  Total critical care time: 40 minutes  Critical care time was exclusive of separately billable procedures and treating other patients.  Critical care was necessary to treat or prevent imminent or life-threatening deterioration.  Critical care was time spent personally by me (independent of midlevel providers or residents) on the following activities: development of treatment plan with patient and/or surrogate as well as nursing, discussions with consultants, evaluation of patient's response to treatment, examination of patient, obtaining history from patient or surrogate, ordering and performing treatments and interventions, ordering and review of laboratory studies, ordering and review of radiographic studies, pulse oximetry and re-evaluation of patient's condition.   Glori Bickers MD 04/30/2019 9:10 AM

## 2019-04-30 NOTE — Progress Notes (Signed)
[facility] Progress Note Patient Name: Lawrence Olson DOB: Jan 28, 1951 [name] [id number]   Date of Service  04/30/2019  HPI/Events of Note  Agitation - Patient at Propofol IV infusion. However, has better response to Fentanyl IV PRN. Request for Fentanyl IV infusion.  eICU Interventions  Will order: 1. Fentanyl IV infusion (0-200 mcg/hour). Titrate to RASS = 0.     Intervention Category Major Interventions: Delirium, psychosis, severe agitation - evaluation and management  Chike Farrington Eugene 04/30/2019, 10:12 PM

## 2019-04-30 NOTE — Procedures (Signed)
Patient Name: Lawrence Olson  [name] [id number]  Epilepsy Attending: Lora Havens  Referring Physician/Provider: Dr Karena Addison Aroor Date: 04/30/2019 Duration: 25.10 minutes  Patient history: 68 year old male status post cardiac arrest and encephalopathy.  EEG to evaluate for seizure.  Level of alertness: Sedated/comatose  AEDs during EEG study: Clonazepam, propofol  Technical aspects: This EEG study was done with scalp electrodes positioned according to the 10-20 International system of electrode placement. Electrical activity was acquired at a sampling rate of 500Hz  and reviewed with a high frequency filter of 70Hz  and a low frequency filter of 1Hz . EEG data were recorded continuously and digitally stored.   DESCRIPTION:  The posterior dominant rhythm consists of 8 Hz activity of moderate voltage (25-35 uV) seen predominantly in posterior head regions, symmetric and reactive to eye opening and eye closing. There is also an excessive amount of 15 to 18 Hz, 2-3 uV beta activity with irregular morphology distributed symmetrically and diffusely.  Continuous generalized 2 to 6 Hz theta-delta slowing, maximal left temporal was also noted.  Hyperventilation and photic stimulation were not performed.  ABNORMALITY: 1.  Continuous slow, generalized, maximal left temporal 2.  Excessive fast activity, generalized  IMPRESSION: This study is suggestive of cortical dysfunction in left temporal region as well as mild diffuse encephalopathy.  No seizures or epileptiform discharges were seen throughout the recording.  The excessive beta activity seen in the background is most likely due to the effect of benzodiazepine and is a benign EEG pattern.  Macie Baum Barbra Sarks

## 2019-04-30 NOTE — Progress Notes (Signed)
LTM EEG hooked up and running - no initial skin breakdown - push button tested - neuro notified.  

## 2019-04-30 NOTE — Progress Notes (Addendum)
Pt kept even on CCRT overnight per handoff report & Dr. Haroldine Laws note.  @ 0600- Pt began posturing (decorticate/ flexion) with pupils dilating and constricting. Episode lasted about 30 minutes- CCM and Neurology notified. Keppra ordered per CCM, EEG ordered per Nephrology this am.   Nephrology notified r/t Potassium this am- 5.7. Verbal orders given to change replacement bag to 2/2.5, no rate change. Pharmacy notified.   Pt became extremely agitated, stacking breaths & dyssynchronous with ventilator when attempting sedation decrease. Will relay to day shift RN.

## 2019-04-30 NOTE — Progress Notes (Signed)
NAMEEgon Olson, [name]  [id number], DOB:  [birth year], LOS: 0 ADMISSION DATE:  04/16/2019, CONSULTATION DATE:  04/16/2019 REFERRING MD:  Claiborne Billings CHIEF COMPLAINT:  STEMI, resp failure  Brief History   68yo M out of hospital cardiac arrest with ROSC, intubated, found to have STEMI, taken emergently to cath lab. Proximal LAD lesion. PCCM consulted for management assistance.   Past Medical History   has no past medical history on file.   has a past surgical history that includes RIGHT HEART CATH AND CORONARY ANGIOGRAPHY (N/A, 04/16/2019) and VENTRICULAR ASSIST DEVICE INSERTION (N/A, 04/16/2019).  Significant Hospital Events   8/24: Cath Lab. PTCI unsuccessful despite multiple attempts.  RA 13 RV 42/16 PA 40/26 (31) PCWP 18 Pa sat 50%  Fick 2.7/1.3 CPO 0.6 Impella placed. VT /p same requiring defib.   8/27: Remains comatose with decorticate posturing.  Still on Impella support.  Weaning vasoactive drips.  At this point major question is neurological recovery.  Would need further cardiac intervention if he were to recover neurologically.  Neurology has been consulted. EEG - negative  8/28 - Impellla removed. Restart abx due to fevers  8/29 -  Daughter says he moves extremities in responds to [place] language.   8/30- Desats with mucus plugs, moving his head towards voice and opeening to voice and trying to focus  8/31 Bronchoscopy to eval for mucus plugs- Clean airways noted.   9/3 Dialysis cath placed. CRRT started  Consults:  Cardiology PCCM Heart failure  Nephrology Neurology  Procedures:  8/24: PTCI unsuccessful 8/14: Impella L groin>> 8/24: 7.5 ETT>> 8/24: L IJ CVC >>> 8/28: Rt radial A line 8/31: Bronch with BAL 9/3: Dialysis cath Significant Diagnostic Tests:  Echocardiogram 8/27: Well-positioned Impella cannula.  Continued severely depressed LV function with anteroapical wall motion abnormality.    Echo 04/24/2019- severely depressed left ventricular  systolic function with   global hypokinesis and anteroapical dyskinesis. LVEF 25-30%.  EEG 8/27: severe, diffuse encephalopathy without specific etiology EEG 9/7: pending  MRI brain 04/23/2019- punctate infarct in the caudate head.  No clear evidence of global hypoxic ischemic injury.   CXR 04/24/19: BILATERAL diffuse pulmonary infiltrates consistent with      pneumonia, perhaps slightly improved in the upper lobe  CXR 04/26/19: no interval change in interstitial and airspace opacities. Small pleural effusions present.  CXR 04/27/19: No significant interval change  Renal US 04/26/19: neg for hydronephrosis or stone    Renal US Micro Data:  COVID-19: Negative 8/24 blood cultures x2>>> negative Urine culture 8/27:>>> Negative MRSA swab: Positive BAL 8/31 > no growth 9/5 Blood culturex2> no growth at 2d 9/5 resp culture > no growth at 2 day  Antimicrobials:  Vancomycin 8/24>>8/25; 8/28>>9/2; 9/5>> Cefepime 8/24>>8/26; 8/28>>8/30 Zosyn 8/30>>9/5 Meropenem 9/5>>  Interim history/subjective:  E-link notes from 0500 this am: patient posturing and pupils dilating and constricting--concern for seizure. Keppra loading dose ordered and neurology consulted. Nursing notes indicate that this lasted about 30 min.   Objective    Today's Vitals   04/30/19 0513 04/30/19 0600 04/30/19 0614 04/30/19 0700  BP:  112/66  114/68  Pulse:  71  71  Resp:  16  17  Temp: (!) 96.8 F (36 C)  (!) 96.2 F (35.7 C)   TempSrc: Axillary  Axillary   SpO2:  98%  96%  Weight:      Height:      PainSc:       Vent Mode: PRVC FiO2 (%):  [40 %-50 %]  40 % Set Rate:  [21 bmp] 21 bmp Vt Set:  [510 mL] 510 mL PEEP:  [8 cmH20] 8 cmH20 Pressure Support:  [8 TOI71-24 cmH20] 8 cmH20 Plateau Pressure:  [16 cmH20-21 cmH20] 17 cmH20  CVP:  [3 mmHg-7 mmHg] 7 mmHg  Examination: Gen:      No acute distress. Sedated Intubated. Propofol off since earlier this morning. HEENT:  Head atraumatic, no scleral icterus. ETT.  EEG leads in place. Neck:     No masses; no thyromegaly. Left sided dialysis cath in place Lungs:    Bibasilar crackles and rhonchi.  CV:         Regular rate and regular rhythm; no murmurs.  No pitting edema appreciated today Abd:      + bowel sounds; soft, non-tender; no palpable masses, mildly distended Ext:  warm to touch. Bear hugger on    Skin:      Warm and dry; no rash Neuro:  Opens eyes to voice. PERRL. Not following commands. Was not tracking finger. No posturing. No tremors. No rigidity of upper extremities. Withdraws to pain.  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  Patient was taken to the cath lab and found to have 100% proximal LAD lesion. PCI unsuccessful so impella placed (removed 8/28).    Acute systolic heart failure and cardiogenic shock status post cardiac arrest/ VF- r/t anterior MI, CAD with acute STEMI - 100% prox LAD  EF 25-30% with significant wall motion abnormalities. Pt initially vol up. Down about 5L since admission.  Cardiogenic shock. Heart failure decreased milrinone 9/6: 0.25-->0.125 CVP4 Plan Cardiology recs: statin + aspirin for now Will need to hold off on on ACE/ARB/ARNI/beta-blocker due to renal function  Acute hypoxic respiratory failure -pulmonary edema secondary to MI, cardiac arrest, cardiogenic shock vs infection.  8/31 Neg blood (5d) and BAL cultures   9/5 Repeat resp and blood cultures showing no growth at 2d -Development of low grade hypothermia over the weekend. White count continuing to climb. 17k on 9/3-->30k 9/6-->27 today. ESR 128. No significant changes on CXRs with bilateral infiltrates. Remainder of VSS. Not requiring vasopressors at this time. Impression: Etiology remains unclear.  -If  this was attributable to infection, would expect some change on CXR and a decrease rather than increase in white count considering length of time he has been receiving antibiotics.  Other considerations are amiodarone induced pneumonitis vs mild ARDS (PaO2/FiO2 288). Amiodarone induced pneumonitis is more common to occur after longer time of use however this should still certainly remain in the picture--stopped 3d ago and was placed on empiric steroids. Due to amiodarone's long half life, pulm disease can continue to progress even after discontinuation of amiodarone. -Legionella can also be considered given the bilateral infiltrates and diarrhea. Hyponatremia not present however this can be convoluted with CRRT and other sources of sodium replacement. Does not appear that he has received abx that would cover this. Plan Continue trying to wean as able.  Continue vancomycin and meropenem Continue solumedrol empirically for amio induced pneumonitis.    AKI - suspicious for ATN. UA significant for hgb and hyaline casts. Nephrology following. CRRT started 9/3. Improvement in renal labs Hyperkalemia--K 5.7. Phos 5.8. Mg 3.0 Plan  CRRT per nephrology K supplementation held due to hyperkalemia   Acute encephalopathy  MRI not convincing for anoxic brain injury.  Puntuate infarct of right caudal head. No findings on echo consistent with cardiogenic source.  Pt was noted to have posturing and pupil constriction/dilation early this am lasting approx 26mn. Concerning for possible seizure activity. Loading dose keppra initiated and neurology consult placed.  Impression: Diagnosis and etiology of above activity remain unclear. No history of seizure disorder. Prior EEGs performed during this hospitalization did not indicate seizure like activity. Plan EEG today Will hold off on further keppra pending EEG results.  Hyperglycemia. Insulin drip restarted due to initiation of steroids for possible amio induced  pneumonitis Plan Continue insulin drip  Anemia of critical illness. Hgb stable. Plan: continue to monitor. Transaminitis. Likely attributable to to liver shock. Slowly improving. Plan: continue to monitor    Best practice Sedation: PAD protocol.  Nutrition: tubefeeds GI prophylaxis: pepcid DVT prophylaxis:heparin Glycemic control: SSI + detemir  Lines: Art line, central line day 10, NG, ETT.  Dialysis cath  Family: per primary team  ATTESTATION & SIGNATURE   The patient is critically ill with multiple organ system failure and requires high complexity decision making for assessment and support, frequent evaluation and titration of therapies, advanced monitoring, review of radiographic studies and interpretation of complex data.   Critical Care Time devoted to patient care services, exclusive of separately billable procedures, described in this note is 35 minutes.   RMitzi Hansen MD Internal Medicine Resident-PGY1 04/30/2019, 7:37 AM

## 2019-04-30 NOTE — Progress Notes (Signed)
[facility] Progress Note Patient Name: Finnean Cerami DOB: 1950/11/03 [name] [id number]   Date of Service  04/30/2019  HPI/Events of Note  A-line site oozing.   eICU Interventions  Apply thrombi-pad to A-line site.      Intervention Category Major Interventions: Other:  Lysle Dingwall 04/30/2019, 12:34 AM

## 2019-04-30 NOTE — Progress Notes (Signed)
[facility] Progress Note Patient Name: Lawrence Olson DOB: 05/19/1951 [name] [id number]   Date of Service  04/30/2019  HPI/Events of Note  Multiple issues: 1. Patient posturing and pupils dilating and constricting - seizure? And 2. K+ = 5.7. Patient is on CRRT and PO Potassium. Neurology has already been consulted.   eICU Interventions  Will order: 1. D/C oral K+ replacement. 2. Notify Nephrology of K+ = 5.7. 3. Keppra 1000 mg IV now, then 500 mg Q 12 hours.  4. Await Neurology consultation.      Intervention Category Major Interventions: Electrolyte abnormality - evaluation and management;Seizures - evaluation and management  Torey Reinard Cornelia Copa 04/30/2019, 4:55 AM

## 2019-05-01 ENCOUNTER — Inpatient Hospital Stay (HOSPITAL_COMMUNITY): Payer: PPO

## 2019-05-01 DIAGNOSIS — R4182 Altered mental status, unspecified: Secondary | ICD-10-CM

## 2019-05-01 LAB — POCT ACTIVATED CLOTTING TIME
Activated Clotting Time: 197 seconds
Activated Clotting Time: 208 seconds
Activated Clotting Time: 208 seconds
Activated Clotting Time: 202 seconds

## 2019-05-01 LAB — POCT I-STAT 7, (LYTES, BLD GAS, ICA,H+H)
Bicarbonate: 26.1 mmol/L (ref 20.0–28.0)
Calcium, Ion: 1.12 mmol/L — ABNORMAL LOW (ref 1.15–1.40)
HCT: 27 % — ABNORMAL LOW (ref 39.0–52.0)
Hemoglobin: 9.2 g/dL — ABNORMAL LOW (ref 13.0–17.0)
O2 Saturation: 98 %
Patient temperature: 36.4
Potassium: 4.3 mmol/L (ref 3.5–5.1)
Sodium: 138 mmol/L (ref 135–145)
TCO2: 28 mmol/L (ref 22–32)
pCO2 arterial: 46.4 mmHg (ref 32.0–48.0)
pH, Arterial: 7.356 (ref 7.350–7.450)
pO2, Arterial: 107 mmHg (ref 83.0–108.0)

## 2019-05-01 LAB — CBC
HCT: 25.9 % — ABNORMAL LOW (ref 39.0–52.0)
Hemoglobin: 8.1 g/dL — ABNORMAL LOW (ref 13.0–17.0)
MCH: 31.2 pg (ref 26.0–34.0)
MCHC: 31.3 g/dL (ref 30.0–36.0)
MCV: 99.6 fL (ref 80.0–100.0)
Platelets: 699 10*3/uL — ABNORMAL HIGH (ref 150–400)
RBC: 2.6 MIL/uL — ABNORMAL LOW (ref 4.22–5.81)
RDW: 15.2 % (ref 11.5–15.5)
WBC: 34.5 10*3/uL — ABNORMAL HIGH (ref 4.0–10.5)
nRBC: 0 % (ref 0.0–0.2)

## 2019-05-01 LAB — RENAL FUNCTION PANEL
Albumin: 1.9 g/dL — ABNORMAL LOW (ref 3.5–5.0)
Anion gap: 8 (ref 5–15)
BUN: 49 mg/dL — ABNORMAL HIGH (ref 8–23)
CO2: 25 mmol/L (ref 22–32)
Calcium: 7.6 mg/dL — ABNORMAL LOW (ref 8.9–10.3)
Chloride: 101 mmol/L (ref 98–111)
Creatinine, Ser: 1.82 mg/dL — ABNORMAL HIGH (ref 0.61–1.24)
GFR calc Af Amer: 43 mL/min — ABNORMAL LOW (ref >60)
GFR calc non Af Amer: 37 mL/min — ABNORMAL LOW (ref >60)
Glucose, Bld: 172 mg/dL — ABNORMAL HIGH (ref 70–99)
Phosphorus: 4.7 mg/dL — ABNORMAL HIGH (ref 2.5–4.6)
Potassium: 4.1 mmol/L (ref 3.5–5.1)
Sodium: 134 mmol/L — ABNORMAL LOW (ref 135–145)

## 2019-05-01 LAB — MAGNESIUM: Magnesium: 2.9 mg/dL — ABNORMAL HIGH (ref 1.7–2.4)

## 2019-05-01 LAB — CULTURE, RESPIRATORY W GRAM STAIN: Gram Stain: NONE SEEN

## 2019-05-01 LAB — GLUCOSE, CAPILLARY
Glucose-Capillary: 112 mg/dL — ABNORMAL HIGH (ref 70–99)
Glucose-Capillary: 136 mg/dL — ABNORMAL HIGH (ref 70–99)
Glucose-Capillary: 138 mg/dL — ABNORMAL HIGH (ref 70–99)
Glucose-Capillary: 140 mg/dL — ABNORMAL HIGH (ref 70–99)
Glucose-Capillary: 141 mg/dL — ABNORMAL HIGH (ref 70–99)
Glucose-Capillary: 144 mg/dL — ABNORMAL HIGH (ref 70–99)
Glucose-Capillary: 146 mg/dL — ABNORMAL HIGH (ref 70–99)
Glucose-Capillary: 153 mg/dL — ABNORMAL HIGH (ref 70–99)
Glucose-Capillary: 153 mg/dL — ABNORMAL HIGH (ref 70–99)
Glucose-Capillary: 155 mg/dL — ABNORMAL HIGH (ref 70–99)
Glucose-Capillary: 158 mg/dL — ABNORMAL HIGH (ref 70–99)
Glucose-Capillary: 163 mg/dL — ABNORMAL HIGH (ref 70–99)
Glucose-Capillary: 169 mg/dL — ABNORMAL HIGH (ref 70–99)
Glucose-Capillary: 177 mg/dL — ABNORMAL HIGH (ref 70–99)
Glucose-Capillary: 180 mg/dL — ABNORMAL HIGH (ref 70–99)
Glucose-Capillary: 182 mg/dL — ABNORMAL HIGH (ref 70–99)
Glucose-Capillary: 198 mg/dL — ABNORMAL HIGH (ref 70–99)
Glucose-Capillary: 215 mg/dL — ABNORMAL HIGH (ref 70–99)
Glucose-Capillary: 217 mg/dL — ABNORMAL HIGH (ref 70–99)
Glucose-Capillary: 217 mg/dL — ABNORMAL HIGH (ref 70–99)
Glucose-Capillary: 94 mg/dL (ref 70–99)

## 2019-05-01 LAB — COOXEMETRY PANEL
Carboxyhemoglobin: 1 % (ref 0.5–1.5)
Methemoglobin: 0.8 % (ref 0.0–1.5)
O2 Saturation: 70.9 %
Total hemoglobin: 13.6 g/dL (ref 12.0–16.0)

## 2019-05-01 MED ORDER — INSULIN DETEMIR 100 UNIT/ML ~~LOC~~ SOLN
5.0000 [IU] | Freq: Two times a day (BID) | SUBCUTANEOUS | Status: DC
  Administered 2019-05-01 – 2019-05-04 (×7): 5 [IU] via SUBCUTANEOUS
  Filled 2019-05-01 (×7): qty 0.05

## 2019-05-01 MED ORDER — ORAL CARE MOUTH RINSE
15.0000 mL | Freq: Two times a day (BID) | OROMUCOSAL | Status: DC
  Administered 2019-05-01 – 2019-05-04 (×3): 15 mL via OROMUCOSAL

## 2019-05-01 MED ORDER — CHLORHEXIDINE GLUCONATE 0.12 % MT SOLN
15.0000 mL | Freq: Two times a day (BID) | OROMUCOSAL | Status: DC
  Administered 2019-05-02 – 2019-05-04 (×7): 15 mL via OROMUCOSAL
  Filled 2019-05-01 (×6): qty 15

## 2019-05-01 MED ORDER — QUETIAPINE FUMARATE 100 MG PO TABS
100.0000 mg | ORAL_TABLET | Freq: Two times a day (BID) | ORAL | Status: DC
  Filled 2019-05-01: qty 1

## 2019-05-01 MED ORDER — "THROMBI-PAD 3""X3"" EX PADS"
1.0000 | MEDICATED_PAD | Freq: Once | CUTANEOUS | Status: DC
  Filled 2019-05-01: qty 1

## 2019-05-01 MED ORDER — INSULIN ASPART 100 UNIT/ML ~~LOC~~ SOLN
3.0000 [IU] | SUBCUTANEOUS | Status: DC
  Administered 2019-05-02 (×4): 6 [IU] via SUBCUTANEOUS
  Administered 2019-05-02: 9 [IU] via SUBCUTANEOUS
  Administered 2019-05-02: 3 [IU] via SUBCUTANEOUS
  Administered 2019-05-03: 9 [IU] via SUBCUTANEOUS
  Administered 2019-05-03: 04:00:00 3 [IU] via SUBCUTANEOUS
  Administered 2019-05-03: 9 [IU] via SUBCUTANEOUS
  Administered 2019-05-03: 6 [IU] via SUBCUTANEOUS
  Administered 2019-05-04 (×3): 3 [IU] via SUBCUTANEOUS

## 2019-05-01 MED ORDER — METHYLPREDNISOLONE SODIUM SUCC 125 MG IJ SOLR
60.0000 mg | Freq: Two times a day (BID) | INTRAMUSCULAR | Status: DC
  Administered 2019-05-01 – 2019-05-02 (×2): 60 mg via INTRAVENOUS
  Filled 2019-05-01 (×2): qty 2

## 2019-05-01 MED ORDER — VANCOMYCIN HCL IN DEXTROSE 750-5 MG/150ML-% IV SOLN
750.0000 mg | Freq: Once | INTRAVENOUS | Status: AC
  Administered 2019-05-01: 750 mg via INTRAVENOUS
  Filled 2019-05-01: qty 150

## 2019-05-01 NOTE — Progress Notes (Signed)
Neurology Progress Note   S:// No acute events overnight   O:// Current vital signs: BP 114/68   Pulse 71   Temp (!) 97.3 F (36.3 C)   Resp (!) 23   Ht '5\' 6"'  (1.676 m)   Wt 87.4 kg   SpO2 96%   BMI 31.10 kg/m  Vital signs in last 24 hours: Temp:  [95.9 F (35.5 C)-98.8 F (37.1 C)] 97.3 F (36.3 C) (09/08 0730) Pulse Rate:  [69-93] 71 (09/08 0752) Resp:  [11-23] 23 (09/08 0752) BP: (99-149)/(50-76) 114/68 (09/08 0752) SpO2:  [94 %-100 %] 96 % (09/08 0752) Arterial Line BP: (88-159)/(42-57) 114/55 (09/08 0730) FiO2 (%):  [40 %] 40 % (09/08 0752) Weight:  [87.4 kg] 87.4 kg (09/08 0455) Neurological exam Examined patient off of sedation. Remains intubated Opens eyes to voice. Attempts to follow commands in all 4 extremities. Cranial: Pupils equal round react to light, blinks to threat from both sides, attempts to track the examiner at both sides, difficult ascertain facial symmetry due to the eccentric to. Motor exam: He is able to move all 4 extremities-upper extremities nearly antigravity in lower extremities wiggling toes to command. Sensory exam: Acknowledges touch appropriately in all 4 extremities Difficult to assess coordination  Medications  Current Facility-Administered Medications:  .   prismasol BGK 4/2.5 infusion, , CRRT, Continuous, Rosita Fire, MD, Last Rate: 500 mL/hr at 04/30/19 2238 .   stroke: mapping our early stages of recovery book, , Does not apply, Once, Greta Doom, MD .  0.9 %  sodium chloride infusion, , Intravenous, PRN, Troy Sine, MD, Stopped at 05/01/19 0501 .  [COMPLETED] anidulafungin (ERAXIS) 200 mg in sodium chloride 0.9 % 200 mL IVPB, 200 mg, Intravenous, Once, Stopped at 04/29/19 1202 **FOLLOWED BY** anidulafungin (ERAXIS) 100 mg in sodium chloride 0.9 % 100 mL IVPB, 100 mg, Intravenous, Q24H, Bensimhon, Shaune Pascal, MD, Stopped at 04/30/19 1248 .  aspirin chewable tablet 81 mg, 81 mg, Per Tube, Daily, Noemi Chapel P, DO .  atorvastatin (LIPITOR) tablet 80 mg, 80 mg, Per Tube, q1800, Larey Dresser, MD, 80 mg at 04/30/19 1744 .  B-complex with vitamin C tablet 1 tablet, 1 tablet, Per Tube, Daily, Noemi Chapel P, DO .  chlorhexidine gluconate (MEDLINE KIT) (PERIDEX) 0.12 % solution 15 mL, 15 mL, Mouth Rinse, BID, Bensimhon, Shaune Pascal, MD, 15 mL at 05/01/19 0745 .  Chlorhexidine Gluconate Cloth 2 % PADS 6 each, 6 each, Topical, Daily, Troy Sine, MD, 6 each at 05/01/19 [phone]  famotidine (PEPCID) 40 MG/5ML suspension 20 mg, 20 mg, Per Tube, Daily, Agarwala, Ravi, MD, 20 mg at 04/30/19 1014 .  feeding supplement (PRO-STAT SUGAR FREE 64) liquid 30 mL, 30 mL, Per Tube, BID, Agarwala, Ravi, MD, 30 mL at 04/30/19 2122 .  feeding supplement (VITAL AF 1.2 CAL) liquid 1,000 mL, 1,000 mL, Per Tube, Continuous, Troy Sine, MD, Last Rate: 65 mL/hr at 05/01/19 0700 .  fentaNYL (SUBLIMAZE) injection 25-100 mcg, 25-100 mcg, Intravenous, Q2H PRN, Brand Males, MD, 100 mcg at 04/30/19 2129 .  fentaNYL 2577mg in NS 2563m(1022mml) infusion-PREMIX, 0-200 mcg/hr, Intravenous, Titrated, SomOletta DartereVirgina EvenerD, Stopped at 05/01/19 [phone] heparin 10,000 units/ 20 mL infusion syringe, 250-3,000 Units/hr, CRRT, Continuous, BhaRosita FireD, Last Rate: 3.5 mL/hr at 05/01/19 0539, 1,750 Units/hr at 05/01/19 0539 .  heparin bolus via infusion syringe 1,000 Units, 1,000 Units, CRRT, PRN, BhaRosita FireD, 1,000 Units at 04/26/19 2015 .  heparin injection 1,000-6,000 Units, 1,000-6,000 Units, CRRT, PRN, Rosita Fire, MD, 2,400 Units at 05/01/19 0858 .  heparin injection 5,000 Units, 5,000 Units, Subcutaneous, Q8H, Troy Sine, MD, 5,000 Units at 05/01/19 [phone] .  hydrALAZINE (APRESOLINE) injection 20 mg, 20 mg, Intravenous, BID PRN, Larey Dresser, MD, 20 mg at 04/26/19 0830 .  insulin regular, human (MYXREDLIN) 100 units/ 100 mL infusion, , Intravenous, Continuous, Troy Sine, MD,  Last Rate: 8.1 mL/hr at 05/01/19 0800 .  levalbuterol (XOPENEX) nebulizer solution 0.63 mg, 0.63 mg, Nebulization, Q3H PRN, Anders Simmonds, MD .  levETIRAcetam (KEPPRA) IVPB 500 mg/100 mL premix, 500 mg, Intravenous, Q12H, Anders Simmonds, MD, Stopped at 05/01/19 0421 .  MEDLINE mouth rinse, 15 mL, Mouth Rinse, 10 times per day, Bensimhon, Shaune Pascal, MD, 15 mL at 05/01/19 0527 .  meropenem (MERREM) 1 g in sodium chloride 0.9 % 100 mL IVPB, 1 g, Intravenous, Q12H, Ronna Polio, RPH, Stopped at 04/30/19 2256 .  methylPREDNISolone sodium succinate (SOLU-MEDROL) 125 mg/2 mL injection 60 mg, 60 mg, Intravenous, Q6H, Bensimhon, Shaune Pascal, MD, 60 mg at 05/01/19 0501 .  midazolam (VERSED) injection 1-2 mg, 1-2 mg, Intravenous, Q2H PRN, Rigoberto Noel, MD, 2 mg at 04/30/19 0405 .  ondansetron (ZOFRAN) injection 4 mg, 4 mg, Intravenous, Q6H PRN, Troy Sine, MD .  prismasol BGK 2/2.5 dialysis solution, , CRRT, Continuous, Roney Jaffe, MD, Last Rate: 1,500 mL/hr at 05/01/19 0746 .  prismasol BGK 2/2.5 replacement solution, , CRRT, Continuous, Roney Jaffe, MD, Last Rate: 300 mL/hr at 04/30/19 2239 .  propofol (DIPRIVAN) 1000 MG/100ML infusion, 5-80 mcg/kg/min, Intravenous, Titrated, Einar Grad, [facility], Stopped at 05/01/19 6160 .  QUEtiapine (SEROQUEL) tablet 100 mg, 100 mg, Oral, BID, Rigoberto Noel, MD, 100 mg at 04/30/19 2123 .  sodium chloride flush (NS) 0.9 % injection 10 mL, 10 mL, Intravenous, Q12H, Troy Sine, MD, 10 mL at 04/29/19 2215 .  sodium chloride flush (NS) 0.9 % injection 10 mL, 10 mL, Intravenous, PRN, Shelva Majestic A, MD .  sodium chloride flush (NS) 0.9 % injection 10-40 mL, 10-40 mL, Intracatheter, Q12H, Troy Sine, MD, 10 mL at 04/30/19 2217 .  sodium chloride flush (NS) 0.9 % injection 10-40 mL, 10-40 mL, Intracatheter, PRN, Troy Sine, MD .  Thrombi-Pad 3"X3" pad 1 each, 1 each, Topical, Once, Troy Sine, MD .  Margrett Rud vancomycin (VANCOCIN)  1,500 mg in sodium chloride 0.9 % 500 mL IVPB, 1,500 mg, Intravenous, Once, Stopped at 04/28/19 1700 **FOLLOWED BY** vancomycin (VANCOCIN) IVPB 1000 mg/200 mL premix, 1,000 mg, Intravenous, Q24H, Ronna Polio, RPH, Stopped at 04/30/19 1404 Labs CBC    Component Value Date/Time   WBC 34.5 (H) 05/01/2019 0320   RBC 2.60 (L) 05/01/2019 0320   HGB 9.2 (L) 05/01/2019 0611   HCT 27.0 (L) 05/01/2019 0611   PLT 699 (H) 05/01/2019 0320   MCV 99.6 05/01/2019 0320   MCH 31.2 05/01/2019 0320   MCHC 31.3 05/01/2019 0320   RDW 15.2 05/01/2019 0320   LYMPHSABS 7.2 (H) 04/16/2019 1335   MONOABS 0.8 04/16/2019 1335   EOSABS 0.0 04/16/2019 1335   BASOSABS 0.3 (H) 04/16/2019 1335    CMP     Component Value Date/Time   NA 138 05/01/2019 0611   K 4.3 05/01/2019 0611   CL 101 05/01/2019 0320   CO2 25 05/01/2019 0320   GLUCOSE 172 (H) 05/01/2019 0320   BUN 49 (H) 05/01/2019 0320  CREATININE 1.82 (H) 05/01/2019 0320   CALCIUM 7.6 (L) 05/01/2019 0320   PROT 5.9 (L) 04/22/2019 0248   ALBUMIN 1.9 (L) 05/01/2019 0320   AST 45 (H) 04/22/2019 0248   ALT 48 (H) 04/22/2019 0248   ALKPHOS 70 04/22/2019 0248   BILITOT 1.2 04/22/2019 0248   GFRNONAA 37 (L) 05/01/2019 0320   GFRAA 43 (L) 05/01/2019 0320    Imaging I have reviewed images in epic and the results pertinent to this consultation are: MRI of the brain reviewed yesterday.  No new imaging.  Spot EEG on LTM EEG from yesterday unremarkable for seizure activity.  Assessment: 68 year old man, status post cardiac arrest, prolonged encephalopathy, concern for seizure-neurology recalled for possible seizures. No definitive seizures per report.  Spot EEG negative for seizures.  LTM EEG overnight briefly reviewed with Dr. Wandra Mannan read pending, negative for seizures. I suspect that he has had some amount of hypoxic/anoxic brain injury as evidenced by some questionable areas of restricted diffusion in the bilateral frontal and insular  regions. I think his prolonged encephalopathy is due to a combination of the hypoxic ischemic encephalopathy as well as the underlying medical conditions making it a multifactorial toxic metabolic encephalopathy. He is improving on his examination and I anticipate that he would make progress over time- I am unable to at this time ascertain the rate or the extent of improvement but his progress thus far neurologically has not been dismal.  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  If he is able to extubate, extubation per PCCM.  If unable to extubate, reasonable to do tracheostomy, which might be temporary as he continues to show more improvement. Discontinue LTM Neurology will be available as needed Please call with questions  -- Amie Portland, MD [facility] Pager: [phone] If 7pm to 7am, please call on call as listed on AMION.  CRITICAL CARE ATTESTATION Performed by: Amie Portland, MD Total critical care time: 30 minutes Critical care time was exclusive of separately billable procedures and treating other patients and/or supervising APPs/Residents/Students Critical care was necessary to treat or prevent imminent or life-threatening deterioration due to hypoxic ischemic encephalopathy This patient is critically ill and at significant risk for neurological worsening and/or death and care requires constant monitoring. Critical care was time spent personally by me on the following activities: development of treatment plan with patient and/or surrogate as well as nursing, discussions with consultants, evaluation of patient's response to treatment, examination of patient, obtaining history from patient or surrogate, ordering and performing treatments and interventions, ordering and review of laboratory studies, ordering and review of radiographic  studies, pulse oximetry, re-evaluation of patient's condition, participation in multidisciplinary rounds and medical decision making of high complexity in the care of this patient.

## 2019-05-01 NOTE — Progress Notes (Signed)
Pt was placed on a T-piece for weaning per Dr. Lamonte Sakai. He is on 8L and 40% FiO2. Will continue to monitor.

## 2019-05-01 NOTE — Procedures (Signed)
Patient Name: Lawrence Olson  [name] [id number]  Epilepsy Attending: Lora Havens  Referring Physician/Provider: Dr Zeb Comfort Duration: 04/30/2019 0859 to 05/01/2019 0859  Patient history: 68 year old male status post cardiac arrest and encephalopathy.  EEG to evaluate for seizure.  Level of alertness: Sedated/comatose  AEDs during EEG study: keppra propofol  Technical aspects: This EEG study was done with scalp electrodes positioned according to the 10-20 International system of electrode placement. Electrical activity was acquired at a sampling rate of 500Hz  and reviewed with a high frequency filter of 70Hz  and a low frequency filter of 1Hz . EEG data were recorded continuously and digitally stored.   DESCRIPTION:  The posterior dominant rhythm consists of 8 Hz activity of moderate voltage (25-35 uV) seen predominantly in posterior head regions, symmetric and reactive to eye opening and eye closing. Continuous generalized 2 to 6 Hz theta-delta slowing, maximal left temporal was also noted.  There was also intermittent rhythmic 2-3Hz  generalized delta slowing. Hyperventilation and photic stimulation were not performed.  ABNORMALITY: 1.  Continuous slow, generalized, maximal left temporal 2.  Intermittent rhythmic slow, generalized  IMPRESSION: This study is suggestive of cortical dysfunction in left temporal region as well as mild diffuse encephalopathy.  No seizures or epileptiform discharges were seen throughout the recording.

## 2019-05-01 NOTE — Progress Notes (Signed)
LTM EEG discontinued - no skin breakdown at unhook.   

## 2019-05-01 NOTE — Progress Notes (Addendum)
NAMEShahzad Olson, [name]  [id number], DOB:  Oct 30, 1950, LOS: 0 ADMISSION DATE:  04/16/2019, CONSULTATION DATE:  04/16/2019 REFERRING MD:  Claiborne Billings CHIEF COMPLAINT:  STEMI, resp failure  Brief History   68yo M out of hospital cardiac arrest with ROSC, intubated, found to have STEMI, taken emergently to cath lab. Proximal LAD lesion. PCCM consulted for management assistance.   Past Medical History   has no past medical history on file.   has a past surgical history that includes RIGHT HEART CATH AND CORONARY ANGIOGRAPHY (N/A, 04/16/2019) and VENTRICULAR ASSIST DEVICE INSERTION (N/A, 04/16/2019).  Significant Hospital Events   8/24: Cath Lab. PTCI unsuccessful despite multiple attempts.  RA 13 RV 42/16 PA 40/26 (31) PCWP 18 Pa sat 50%  Fick 2.7/1.3 CPO 0.6 Impella placed. VT /p same requiring defib.   8/27: Remains comatose with decorticate posturing.  Still on Impella support.  Weaning vasoactive drips.  At this point major question is neurological recovery.  Would need further cardiac intervention if he were to recover neurologically.  Neurology has been consulted. EEG - negative  8/28 - Impellla removed. Restart abx due to fevers  8/29 -  Daughter says he moves extremities in responds to [place] language.   8/30- Desats with mucus plugs, moving his head towards voice and opeening to voice and trying to focus  8/31 Bronchoscopy to eval for mucus plugs- Clean airways noted.   9/3 Dialysis cath placed. CRRT started 9/8 CRRT stopped in anticipation of  Transition to intermittent 9/8: extubation  Consults:  Cardiology PCCM Heart failure  Nephrology Neurology  Procedures:  8/24: PTCI unsuccessful 8/14: Impella L groin>> 8/24: 7.5 ETT>> 8/24: L IJ CVC >>> 8/28: Rt radial A line 8/31: Bronch with BAL 9/3: Dialysis cath 9/8: extubation  Significant Diagnostic Tests:  Echocardiogram 8/27: Well-positioned Impella cannula.  Continued severely depressed LV function with  anteroapical wall motion abnormality.    Echo 04/24/2019- severely depressed left ventricular systolic function with   global hypokinesis and anteroapical dyskinesis. LVEF 25-30%.  EEG 8/27: severe, diffuse encephalopathy without specific etiology EEG 9/7: cortical dysfunction in left temporal region. Mild diffuse encephalopathy. No seizures or epileptiform discharges identified.  MRI brain 04/23/2019- punctate infarct in the caudate head.  No clear evidence of global hypoxic ischemic injury.   CXR 04/24/19: BILATERAL diffuse pulmonary infiltrates consistent with      pneumonia, perhaps slightly improved in the upper lobe  CXR 04/26/19: no interval change in interstitial and airspace opacities. Small pleural effusions present.  CXR 04/27/19: No significant interval change  Renal US 04/26/19: neg for hydronephrosis or stone    Renal US Micro Data:  COVID-19: Negative 8/24 blood cultures x2>>> negative Urine culture 8/27:>>> Negative MRSA swab: Positive BAL 8/31 > no growth 9/5 Blood culturex2> no growth at 3d 9/5 resp culture > no growth at 3 day  Antimicrobials:  Vancomycin 8/24>>8/25; 8/28>>9/2; 9/5>> Cefepime 8/24>>8/26; 8/28>>8/30 Zosyn 8/30>>9/5 Meropenem 9/5>>  Interim history/subjective:  No significant events overnight. Sedation off since early this morning. Patient awake and alert--following commands. Denies pain.  Objective    Today's Vitals   05/01/19 0630 05/01/19 0700 05/01/19 0730 05/01/19 0752  BP: 108/66 108/67 115/62 114/68  Pulse: 70 70 76 71  Resp: (!) 21 (!) 21 19 (!) 23  Temp: (!) 97.5 F (36.4 C) (!) 97.3 F (36.3 C) (!) 97.3 F (36.3 C)   TempSrc:      SpO2: 97% 98% 98% 96%  Weight:      Height:  PainSc:       Vent Mode: PRVC FiO2 (%):  [40 %] 40 % Set Rate:  [21 bmp] 21 bmp Vt Set:  [510 mL] 510 mL PEEP:  [5 cmH20-8 cmH20] 8 cmH20 Pressure Support:  [5 cmH20-10 cmH20] 10 cmH20 Plateau Pressure:  [12 cmH20-19 cmH20] 17 cmH20  CVP:  [0 mmHg-2  mmHg] 2 mmHg  Examination: Gen:      No acute distress. Intubated.  HEENT:  Head atraumatic, no scleral icterus. ETT.  Neck:     No masses; no thyromegaly. Left sided dialysis cath in place Lungs:    Bibasilar crackles and rhonchi. Somewhat diminished at left base. Fairly decent strength cough. CV:         Regular rate and regular rhythm; no murmurs.  No pitting edema appreciated today Abd:      + bowel sounds; soft, non-tender; no palpable masses, mildly distended Ext:  warm to touch.  Skin:      Warm and dry; no rash Neuro:  Awake and alert. Following commands. EOMs intact. PERRL. No facial asymmetry. Hearing intact. Gag reflex intact. Upper extremity strength equal and intact. Lifts head off pillow on command. Lifts LEs off bed simultaneously on command.  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  Patient was taken to the cath lab and found to have 100% proximal LAD lesion. PCI unsuccessful so impella placed (removed 8/28).    Acute systolic heart failure and cardiogenic shock status post cardiac arrest/ VF- r/t anterior MI, CAD with acute STEMI - 100% prox LAD  EF 25-30% with significant wall motion abnormalities. Pt initially vol up. Down about 5L since admission.  Cardiogenic shock. Off milrinone since 9/6. Heart failure on board Impression: not appearing volume up at this time. Heart failure appearing stable. Plan Cardiology recs: statin + aspirin for now Will need to hold off on on ACE/ARB/ARNI/beta-blocker due to renal function  Acute hypoxic respiratory failure -pulmonary edema secondary to MI, cardiac arrest, cardiogenic shock vs infection.  8/31 Neg blood (5d) and BAL cultures   9/5 Repeat resp and blood cultures showing no growth  at 3d White count continuing to climb--steroids likely exacerbating this. Pt has remained afebrile however still remains hypothermic at times. Impression: mental status appears appropriate for extubation trial today. Passed T tube trial. Will need to keep an eye out for potential pulm edema however.  In regards to the infection concern, I am skeptical of the necessity for continued abx. The continued climb in white count and body temperature fluctuations can be attributed to other processes. Improvement in respiratory status could argue against this being a pulmonary infection. Plan Extubation today. Discussed with daughter at bedside. Given length of extubation time, will need to remain NPO for now and pass swallow eval. Continue vancomycin and meropenem. Will stop antifungal. Can consider pulling back further on abx tomorrow given consistently negative cultures. 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    AKI - suspicious for ATN. Dialysis dependent (CRRT started 9/3-9/8).  Nephrology following. CRRT stopped today with plans for intermittent dialysis in a couple days. Plan CRRT d/c'd per nephrology. Plan for HD in a couple days.  Acute encephalopathy MRI not convincing for anoxic brain  injury. Puntuate infarct of right caudal head. No findings on echo consistent with cardiogenic source.  Neurology on board No concerning epileptiform signs on stat EEG. LTM completes--results pending. Impression: There is significant improvement in his cognitive function today. Following commands and answering questions. Can consider stopping antiepileptics if LTM neg for seizure activity. Will  discuss further with neurology prior.   Hyperglycemia. Insulin drip restarted due to initiation of steroids for possible amio induced pneumonitis. Plan: Continue insulin drip Anemia of critical illness. Hgb stable.  Transaminitis. Likely attributable to to liver shock. Slowly improving.    Best  practice Sedation: none Nutrition: tubefeeds GI prophylaxis: pepcid DVT prophylaxis:heparin Glycemic control: insulin drip Lines:central line , G tube.  Dialysis cath  Family: per primary team   Mitzi Hansen, MD Internal Medicine Resident-PGY1 05/01/2019, 8:19 AM

## 2019-05-01 NOTE — Progress Notes (Signed)
200 mL of fentanyl wasted in steri-container witnessed by Robinette Haines RN.

## 2019-05-01 NOTE — Progress Notes (Signed)
Pharmacy Antibiotic Note  Lawrence Olson is a 68 y.o. male admitted on 04/16/2019 with cardiogenic shock s/p arrest. Pt on ABX initially then D/Cd on 8/26. Zosyn was restarted 8/28 for pneumonia and then escalated antibiotics to vancomycin, meropenem, and Eraxis for worsening WBC and low-grade fevers. 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 CCM discontinued Eraxis today but would like to continue vancomycin and meropenem for now.  CRRT now off at 0900 this morning  Plan: Continue meropenem 1 g IV q12h Vancomycin 750 mg IV x 1 today at 1300 Follow renal function off CRRT for further dosing. Follow up length of therapy, de-escalation, and vancomycin levels as needed  Height: _0  (167.6 cm) Weight: 192 lb 10.9 oz (87.4 kg) IBW/kg (Calculated) : 63.8  Temp (24hrs), Avg:97.9 F (36.6 C), Min:95.9 F (35.5 C), Max:98.8 F (37.1 C)  Recent Labs  Lab 04/27/19 0240  04/28/19 0319  04/29/19 0327 04/29/19 1620 04/30/19 0258 04/30/19 1600 05/01/19 0320  WBC 22.8*  --  27.2*  --  30.7*  --  27.4*  --  34.5*  CREATININE 2.80*   < > 1.86*   < > 1.90* 1.91* 2.00* 1.83* 1.82*   < > = values in this interval not displayed.    Estimated Creatinine Clearance: 40.2 mL/min (A) (by C-G formula based on SCr of 1.82 mg/dL (H)).    No Known Allergies  Antimicrobials this admission: Vancomycin 8/24 > 8/26, 8/28>9/2; 9/5>> Cefepime 8/24 > 8/26, 8/28>8/30 Zosyn 8/30>>9/5 Mero 9/5>> Eraxis 9/6 >>9/8  Microbiology results: 9/5 sputum - reincubated 9/5 BCx - ngtd 9/4 TA - few candida lusitaniae 8/31 BAL - ngF 8/29 BCx: ngF 8/29 Resp Cx: normal flora 8/24 BCx: ngF 8/24 COVID: neg 8/24 MRSA: positive  Thank you for allowing pharmacy to be a part of this patient's care.  Vertis Kelch, PharmD PGY2 Cardiology Pharmacy Resident Phone [phone] 05/01/2019       7:44 AM  Please check [email or web address] for unit-specific pharmacist  phone numbers

## 2019-05-01 NOTE — Progress Notes (Signed)
Patient ID: Lawrence Olson, male   DOB: 09/05/1950, 68 y.o.   [name] [id number]   Advanced Heart Failure Team  Note   Primary Physician: Patient, No Pcp Per PCP-Cardiologist:  No primary care provider on file.  HPI:    Remains intubated on CVVHD - keeping even  Started on steroids on 9/6 for possible pneumonitis (ESR 128/persistent diffuse lung infiltrates despite 20 pound volume removal)  Much more awake today. Following some commands. Undergoing continuous EEG for ? Seizure. Neurolgoy following.   Vent down to 40%.  Weaning. Co-ox 70%  Off milrinone CVP 1-2  Makin6 20-30 cc urine/hr  On vanc/meropenem/fungal coverage  Afebrile.  WBC 17k -> 22.8k -> 27.2 -> 30k -> 27K -. 34k  Echo 04/24/19 EF 25-30% no LV thrombus   Objective:    Vital Signs:   Temp:  [95.9 F (35.5 C)-98.8 F (37.1 C)] 97.3 F (36.3 C) (09/08 0730) Pulse Rate:  [69-93] 71 (09/08 0752) Resp:  [11-23] 23 (09/08 0752) BP: (99-149)/(50-76) 114/68 (09/08 0752) SpO2:  [94 %-100 %] 96 % (09/08 0752) Arterial Line BP: (88-159)/(42-59) 114/55 (09/08 0730) FiO2 (%):  [40 %] 40 % (09/08 0752) Weight:  [87.4 kg] 87.4 kg (09/08 0455) Last BM Date: 04/30/19  Weight change: Filed Weights   04/29/19 0500 04/30/19 0357 05/01/19 0455  Weight: 88.6 kg 86.5 kg 87.4 kg    Intake/Output:   Intake/Output Summary (Last 24 hours) at 05/01/2019 0804 Last data filed at 05/01/2019 0700 Gross per 24 hour  Intake 2876.32 ml  Output 3066 ml  Net -189.68 ml      Physical Exam   General:  Awake on vent. No resp difficulty HEENT: normal x ETT and EEG electrodes Neck: supple. no JVD. RIJ trialysis. LIJ TLC Carotids 2+ bilat; no bruits. No lymphadenopathy or thryomegaly appreciated. Cor: PMI nondisplaced. Regular rate & rhythm. No rubs, gallops or murmurs. Lungs: clear Abdomen: soft, nontender, nondistended. No hepatosplenomegaly. No bruits or masses. Good bowel sounds. Extremities: no cyanosis, clubbing, rash, edema + boots  Neuro: awake on vent follow some comamnds    Telemetry   NSR 80-90s Personally reviewed  Labs   Basic Metabolic Panel: Recent Labs  Lab 04/27/19 0240  04/28/19 0319  04/29/19 0327  04/29/19 1620 04/30/19 0258 04/30/19 1600 05/01/19 0320 05/01/19 0611  NA 145   < > 139   < > 135   < > 133* 136 136 134* 138  K 3.9   < > 4.0   < > 4.6   < > 5.5* 5.7* 4.4 4.1 4.3  CL 110   < > 104   < > 101  --  101 101 102 101  --   CO2 26   < > 26   < > 24  --  '23 24 25 25  ' --   GLUCOSE 195*   < > 163*   < > 163*  --  169* 284* 133* 172*  --   BUN 88*   < > 51*   < > 44*  --  44* 48* 48* 49*  --   CREATININE 2.80*   < > 1.86*   < > 1.90*  --  1.91* 2.00* 1.83* 1.82*  --   CALCIUM 7.6*   < > 7.8*   < > 8.0*  --  7.9* 7.9* 8.0* 7.6*  --   MG 2.7*  --  2.8*  --  2.8*  --   --  3.0*  --  2.9*  --  PHOS 3.7   < > 4.1   < > 4.5  --  3.9 5.8* 4.6 4.7*  --    < > = values in this interval not displayed.    Liver Function Tests: Recent Labs  Lab 04/29/19 0327 04/29/19 1620 04/30/19 0258 04/30/19 1600 05/01/19 0320  ALBUMIN 2.0* 2.0* 1.9* 2.2* 1.9*   No results for input(s): LIPASE, AMYLASE in the last 168 hours. No results for input(s): AMMONIA in the last 168 hours.  CBC: Recent Labs  Lab 04/27/19 0240  04/28/19 0319  04/29/19 0327 04/29/19 2202 04/30/19 0258 05/01/19 0320 05/01/19 0611  WBC 22.8*  --  27.2*  --  30.7*  --  27.4* 34.5*  --   HGB 8.2*   < > 8.3*   < > 8.8* 9.9* 8.5* 8.1* 9.2*  HCT 27.1*   < > 27.1*   < > 28.5* 29.0* 27.7* 25.9* 27.0*  MCV 99.6  --  98.9  --  99.3  --  98.9 99.6  --   PLT 519*  --  551*  --  630*  --  667* 699*  --    < > = values in this interval not displayed.    Cardiac Enzymes: No results for input(s): CKTOTAL, CKMB, CKMBINDEX, TROPONINI in the last 168 hours.  BNP: BNP (last 3 results) No results for input(s): BNP in the last 8760 hours.  ProBNP (last 3 results) No results for input(s): PROBNP in the last 8760 hours.   CBG:  Recent Labs  Lab 05/01/19 0004 05/01/19 0105 05/01/19 0158 05/01/19 0314 05/01/19 0400  GLUCAP 177* 180* 163* 153* 158*    Coagulation Studies: No results for input(s): LABPROT, INR in the last 72 hours.   Imaging   Dg Chest Port 1 View  Result Date: 05/01/2019 CLINICAL DATA:  Respiratory failure. EXAM: PORTABLE CHEST 1 VIEW COMPARISON:  Radiograph of April 29, 2019. FINDINGS: Stable cardiomediastinal silhouette. Endotracheal and nasogastric tubes are unchanged in position. Right internal jugular catheter tip has been partially withdrawn. No pneumothorax is noted. Left internal jugular catheter is unchanged in position. Stable right basilar atelectasis or infiltrate is noted. Stable mild left basilar atelectasis or infiltrate is noted. Bony thorax is unremarkable. IMPRESSION: Right internal jugular catheter has been significantly withdrawn, with tip now seen at the expected position of the confluence of the internal jugular vein and subclavian vein. Otherwise stable support apparatus. Bilateral lung opacities are noted, right greater than left. Electronically Signed   By: Marijo Conception M.D.   On: 05/01/2019 07:16     Medications:     Current Medications: .  stroke: mapping our early stages of recovery book   Does not apply Once  . aspirin  81 mg Per Tube Daily  . atorvastatin  80 mg Per Tube q1800  . B-complex with vitamin C  1 tablet Per Tube Daily  . chlorhexidine gluconate (MEDLINE KIT)  15 mL Mouth Rinse BID  . Chlorhexidine Gluconate Cloth  6 each Topical Daily  . famotidine  20 mg Per Tube Daily  . feeding supplement (PRO-STAT SUGAR FREE 64)  30 mL Per Tube BID  . heparin injection (subcutaneous)  5,000 Units Subcutaneous Q8H  . mouth rinse  15 mL Mouth Rinse 10 times per day  . methylPREDNISolone (SOLU-MEDROL) injection  60 mg Intravenous Q6H  . QUEtiapine  100 mg Oral BID  . sodium chloride flush  10 mL Intravenous Q12H  . sodium chloride flush  10-40 mL  Intracatheter Q12H  . Thrombi-Pad  1 each Topical Once    Infusions: .  prismasol BGK 4/2.5 500 mL/hr at 04/30/19 2238  . sodium chloride Stopped (05/01/19 0501)  . anidulafungin Stopped (04/30/19 1248)  . feeding supplement (VITAL AF 1.2 CAL) 65 mL/hr at 05/01/19 0700  . fentaNYL infusion INTRAVENOUS Stopped (05/01/19 4098)  . heparin 10,000 units/ 20 mL infusion syringe 1,750 Units/hr (05/01/19 0539)  . insulin 7.2 mL/hr at 05/01/19 0700  . levETIRAcetam Stopped (05/01/19 0421)  . meropenem (MERREM) IV Stopped (04/30/19 2256)  . prismasol BGK 2/2.5 dialysis solution 1,500 mL/hr at 05/01/19 0746  . prismasol BGK 2/2.5 replacement solution 300 mL/hr at 04/30/19 2239  . propofol (DIPRIVAN) infusion Stopped (05/01/19 1191)  . vancomycin Stopped (04/30/19 1404)    Assessment/Plan   1. CAD: Anterolateral STEMI, cath 8/24 showed occluded proximal LAD, unable to open.  Once we see how he recovers neurologically, will need to decide on feasibility of re-attempt PCI versus CABG. Medical therapy for the forseeable future. - No signs of ischemia - Continue ASA + statin.   2. Cardiogenic shock: Ischemic cardiomyopathy.   - Echo with EF 25%, normal RV.  Impella removed 8/28.  Now remains on milrinone 0.25 - Co-ox 71%Milrinone stopped 9/6. Will follow co-ox - No ACE/ARB?ARNI or b-blocker yet with AKI and shock  3. Acute hypoxic respiratory failure - CCM following.  - Suspect combination of HF and possible aspiration  - On broad spectrum abx and anti-fungal coverage. Will continue for now but suspect real issue may be pneumonitis  - Steroids started 9/6 for possible pneumonitis  (ESR 128/persistent diffuse lung infiltrates despite 20 pound volume removal). Amio stopped several days ago. WBC 34k but suspect due to steroids.  - Weaning vent. Down to 40%. CXR improved (Personally reviewed) - Volume status ok. Will run CVVHD even - Several discussions with family by me and CCM. Family ok with  short-term trach. Wil continue tot ry and wean and if unable to wean vent trach at end of week with Dr. Nelda Marseille  4. AKI:  -Suspect hemodynamic (ATN)   - CVVHD started 9/3 - Volume status now [facility] better. Run CVVHD even. Possible switch to iHD as needed (making some urine now)   5. ID: Continues with low grade fevers - ? Aspiration PNA and sinus disease (seen on CT) - On broad spectrum abx and anti-fungal coverage. Will continue for now but suspect real issue may be pneumonitis  - Steroids started 9/6 for possible pneumonitis  (ESR 128/persistent diffuse lung infiltrates despite 20 pound volume removal). Amio stopped several days ago WBC 34k but suspect due to steroids.  - Does not appear to have c.diff  - Consider ID consult as needed. If continues to improve with steroids and cx remain negative would stop abx  5. Cardiac arrest/VF: 20 minutes downtime, concerned for anoxic brain injury.  Neurology following, per yesterday's note he seems to have intact brainstem function but minimal cortical function. EEG suggestive of diffuse encephalopathy. CT head without definite evidence for anoxic encephalopathy.  MRI without convincing evidence of anoxic injury - Reports of patient moving purposefully for family when they speak to him in his native language but I have not witnessed this convincingly - ? Seizure overnight. Neuro has seen again. EEG ongoing. Seems to be improving - small CVA on MRI. Echo on 9/1 with Definity -> no LV thrombus.Hold off on systemic AC at this time  7. Anemia:  - Transfuse as needed. Keep hgb >=  7.5. Hgb 8.1 today  8. Elevated LFTs: Suspect shock liver, improving.   9. DVT prophylaxis: on heparin for CVVHD. Discussed dosing with PharmD personally.  10. Urinary retention - Foley  11. Hyperkalemia - Improved     CRITICAL CARE Performed by: Glori Bickers  Total critical care time: 35 minutes  Critical care time was exclusive of separately billable  procedures and treating other patients.  Critical care was necessary to treat or prevent imminent or life-threatening deterioration.  Critical care was time spent personally by me (independent of midlevel providers or residents) on the following activities: development of treatment plan with patient and/or surrogate as well as nursing, discussions with consultants, evaluation of patient's response to treatment, examination of patient, obtaining history from patient or surrogate, ordering and performing treatments and interventions, ordering and review of laboratory studies, ordering and review of radiographic studies, pulse oximetry and re-evaluation of patient's condition.   Glori Bickers MD 05/01/2019 8:04 AM

## 2019-05-01 NOTE — Progress Notes (Deleted)
RN and RT redressed  L radial A line multiple times overnight, thrombi pads used. Site continues to leak and wave form is dampened, RN to remove A line.

## 2019-05-01 NOTE — Progress Notes (Signed)
Two RN's dangled patient on side of bed utilizing daughter as Optometrist to assist with communication.  Patient very weak and not able to assist RN's in getting to the edge of the bed.  RN's attempted to have patient stand at bedside but patient was unable to assist RN's in standing.  Patient tolerated activity well.  Bed placed in chair position.  PT/OT orders already in place by MD Byrum.

## 2019-05-01 NOTE — Procedures (Signed)
Extubation Procedure Note  Patient Details:   Name: Lawrence Olson DOB: Aug 21, 1951 [name] [id number]   Airway Documentation:    Vent end date: 05/01/19 Vent end time: 1141   Evaluation  O2 sats: stable throughout Complications: No apparent complications Patient did tolerate procedure well. Bilateral Breath Sounds: Diminished, Rhonchi   Yes   Pt was extubated to 6L [name] at 1141. Pt was suctioned and had good cuff leak prior to extubation. Pt seems comfortable at the moment and was able to speak his name afterwards. RT will continue to monitor.   Irem Stoneham A Zelma Mazariego 05/01/2019, 11:44 AM

## 2019-05-01 NOTE — Procedures (Signed)
Admit: 04/16/2019 LOS: 15  13M s/p STEMI-->Cardiac Arrest (unable to PCI prox LAD), ischemic ATN req CRRT, systolc CHF/cardiogenic shock, VDRF  Current CRRT Prescription: Start Date: 04/26/19 Catheter: R IJ Temp HD Cath 9/3 by CCM BFR: 250 Pre Blood Pump: 500 4K DFR: 2K 1500 Replacement Rate: 2K 300 Goal UF: Net event Anticoagulation: Titrated heparin in cRRT circuit Clotting: infequ3ent   S: More awake, following commands Not on vasoactive medications Remains on CRRT, K4.3, P2.9 0.7 L urine output yesterday, steady  O: 09/07 0701 - 09/08 0700 In: 2989.6 [I.V.:463.4; NG/GT:1780; IV Piggyback:746.1] Out: 4920 [Urine:730; Stool:320]  Filed Weights   04/29/19 0500 04/30/19 0357 05/01/19 0455  Weight: 88.6 kg 86.5 kg 87.4 kg    Recent Labs  Lab 04/30/19 0258 04/30/19 1600 05/01/19 0320 05/01/19 0611  NA 136 136 134* 138  K 5.7* 4.4 4.1 4.3  CL 101 102 101  --   CO2 _0 --   GLUCOSE 284* 133* 172*  --   BUN 48* 48* 49*  --   CREATININE 2.00* 1.83* 1.82*  --   CALCIUM 7.9* 8.0* 7.6*  --   PHOS 5.8* 4.6 4.7*  --    Recent Labs  Lab 04/29/19 0327  04/30/19 0258 05/01/19 0320 05/01/19 0611  WBC 30.7*  --  27.4* 34.5*  --   HGB 8.8*   < > 8.5* 8.1* 9.2*  HCT 28.5*   < > 27.7* 25.9* 27.0*  MCV 99.3  --  98.9 99.6  --   PLT 630*  --  667* 699*  --    < > = values in this interval not displayed.    Scheduled Meds: .  stroke: mapping our early stages of recovery book   Does not apply Once  . aspirin  81 mg Per Tube Daily  . atorvastatin  80 mg Per Tube q1800  . B-complex with vitamin C  1 tablet Per Tube Daily  . chlorhexidine gluconate (MEDLINE KIT)  15 mL Mouth Rinse BID  . Chlorhexidine Gluconate Cloth  6 each Topical Daily  . famotidine  20 mg Per Tube Daily  . feeding supplement (PRO-STAT SUGAR FREE 64)  30 mL Per Tube BID  . heparin injection (subcutaneous)  5,000 Units Subcutaneous Q8H  . mouth rinse  15 mL Mouth Rinse 10 times per day  .  methylPREDNISolone (SOLU-MEDROL) injection  60 mg Intravenous Q6H  . QUEtiapine  100 mg Oral BID  . sodium chloride flush  10 mL Intravenous Q12H  . sodium chloride flush  10-40 mL Intracatheter Q12H  . Thrombi-Pad  1 each Topical Once   Continuous Infusions: .  prismasol BGK 4/2.5 500 mL/hr at 04/30/19 2238  . sodium chloride Stopped (05/01/19 0501)  . anidulafungin Stopped (04/30/19 1248)  . feeding supplement (VITAL AF 1.2 CAL) 65 mL/hr at 05/01/19 0700  . fentaNYL infusion INTRAVENOUS Stopped (05/01/19 1007)  . heparin 10,000 units/ 20 mL infusion syringe 1,750 Units/hr (05/01/19 0539)  . insulin 8.1 mL/hr at 05/01/19 0800  . meropenem (MERREM) IV Stopped (04/30/19 2256)  . prismasol BGK 2/2.5 dialysis solution 1,500 mL/hr at 05/01/19 0746  . prismasol BGK 2/2.5 replacement solution 300 mL/hr at 04/30/19 2239  . propofol (DIPRIVAN) infusion Stopped (05/01/19 1219)  . vancomycin Stopped (04/30/19 1404)   PRN Meds:.sodium chloride, fentaNYL (SUBLIMAZE) injection, heparin, heparin, hydrALAZINE, levalbuterol, midazolam, ondansetron (ZOFRAN) IV, sodium chloride flush, sodium chloride flush  ABG    Component Value Date/Time   PHART 7.356 05/01/2019  6122   PCO2ART 46.4 05/01/2019 0611   PO2ART 107.0 05/01/2019 0611   HCO3 26.1 05/01/2019 0611   TCO2 28 05/01/2019 0611   ACIDBASEDEF 5.0 (H) 04/17/2019 0620   O2SAT 98.0 05/01/2019 0611    A/P  1. Nonoliguric dialysis dependent ischemic ATN, on CRRT: Potassium normalized on lower potassium bath.  Given market improvement in hemodynamics and mental status we will stop CRRT today and follow for intermittent HD requirements.  2. Status post cardiac arrest, STEMI with lesion not amenable to PCI, ischemic cardiomyopathy with systolic heart failure and shock.  As above.  Advanced heart failure following. 3. 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 Anemia, hemoglobin 8.5, stable   Pearson Grippe,  MD [employer]

## 2019-05-02 ENCOUNTER — Inpatient Hospital Stay (HOSPITAL_COMMUNITY): Payer: PPO

## 2019-05-02 LAB — GLUCOSE, CAPILLARY
Glucose-Capillary: 146 mg/dL — ABNORMAL HIGH (ref 70–99)
Glucose-Capillary: 150 mg/dL — ABNORMAL HIGH (ref 70–99)
Glucose-Capillary: 153 mg/dL — ABNORMAL HIGH (ref 70–99)
Glucose-Capillary: 156 mg/dL — ABNORMAL HIGH (ref 70–99)
Glucose-Capillary: 187 mg/dL — ABNORMAL HIGH (ref 70–99)
Glucose-Capillary: 188 mg/dL — ABNORMAL HIGH (ref 70–99)
Glucose-Capillary: 204 mg/dL — ABNORMAL HIGH (ref 70–99)

## 2019-05-02 LAB — CBC
HCT: 26.8 % — ABNORMAL LOW (ref 39.0–52.0)
Hemoglobin: 8.4 g/dL — ABNORMAL LOW (ref 13.0–17.0)
MCH: 30.9 pg (ref 26.0–34.0)
MCHC: 31.3 g/dL (ref 30.0–36.0)
MCV: 98.5 fL (ref 80.0–100.0)
Platelets: 892 10*3/uL — ABNORMAL HIGH (ref 150–400)
RBC: 2.72 MIL/uL — ABNORMAL LOW (ref 4.22–5.81)
RDW: 15.7 % — ABNORMAL HIGH (ref 11.5–15.5)
WBC: 33 10*3/uL — ABNORMAL HIGH (ref 4.0–10.5)
nRBC: 0 % (ref 0.0–0.2)

## 2019-05-02 LAB — RENAL FUNCTION PANEL
Albumin: 2.2 g/dL — ABNORMAL LOW (ref 3.5–5.0)
Anion gap: 11 (ref 5–15)
BUN: 87 mg/dL — ABNORMAL HIGH (ref 8–23)
CO2: 24 mmol/L (ref 22–32)
Calcium: 8.1 mg/dL — ABNORMAL LOW (ref 8.9–10.3)
Chloride: 102 mmol/L (ref 98–111)
Creatinine, Ser: 3.02 mg/dL — ABNORMAL HIGH (ref 0.61–1.24)
GFR calc Af Amer: 23 mL/min — ABNORMAL LOW (ref >60)
GFR calc non Af Amer: 20 mL/min — ABNORMAL LOW (ref >60)
Glucose, Bld: 157 mg/dL — ABNORMAL HIGH (ref 70–99)
Phosphorus: 6.4 mg/dL — ABNORMAL HIGH (ref 2.5–4.6)
Potassium: 3.9 mmol/L (ref 3.5–5.1)
Sodium: 137 mmol/L (ref 135–145)

## 2019-05-02 LAB — COOXEMETRY PANEL
Carboxyhemoglobin: 1.4 % (ref 0.5–1.5)
Methemoglobin: 1.1 % (ref 0.0–1.5)
O2 Saturation: 61 %
Total hemoglobin: 8.7 g/dL — ABNORMAL LOW (ref 12.0–16.0)

## 2019-05-02 MED ORDER — VANCOMYCIN VARIABLE DOSE PER UNSTABLE RENAL FUNCTION (PHARMACIST DOSING): Status: DC

## 2019-05-02 MED ORDER — INFLUENZA VAC A&B SA ADJ QUAD 0.5 ML IM PRSY: 0.5000 mL | PREFILLED_SYRINGE | INTRAMUSCULAR | Status: DC | PRN

## 2019-05-02 MED ORDER — FAMOTIDINE 20 MG PO TABS
20.0000 mg | ORAL_TABLET | Freq: Every day | ORAL | Status: DC
  Administered 2019-05-02 – 2019-05-04 (×3): 20 mg via ORAL
  Filled 2019-05-02 (×3): qty 1

## 2019-05-02 MED ORDER — ATORVASTATIN CALCIUM 80 MG PO TABS
80.0000 mg | ORAL_TABLET | Freq: Every day | ORAL | Status: DC
  Administered 2019-05-02 – 2019-05-04 (×3): 80 mg via ORAL
  Filled 2019-05-02 (×3): qty 1

## 2019-05-02 MED ORDER — TAMSULOSIN HCL 0.4 MG PO CAPS
0.4000 mg | ORAL_CAPSULE | Freq: Every day | ORAL | Status: DC
  Administered 2019-05-02 – 2019-05-04 (×3): 0.4 mg via ORAL
  Filled 2019-05-02 (×3): qty 1

## 2019-05-02 MED ORDER — QUETIAPINE FUMARATE 50 MG PO TABS
100.0000 mg | ORAL_TABLET | Freq: Two times a day (BID) | ORAL | Status: DC
  Administered 2019-05-02 – 2019-05-04 (×4): 100 mg via ORAL
  Filled 2019-05-02 (×3): qty 1
  Filled 2019-05-02: qty 2

## 2019-05-02 MED ORDER — B COMPLEX-C PO TABS
1.0000 | ORAL_TABLET | Freq: Every day | ORAL | Status: DC
  Administered 2019-05-03 – 2019-05-04 (×2): 1 via ORAL
  Filled 2019-05-02 (×2): qty 1

## 2019-05-02 MED ORDER — ASPIRIN 81 MG PO CHEW
81.0000 mg | CHEWABLE_TABLET | Freq: Every day | ORAL | Status: DC
  Administered 2019-05-02 – 2019-05-04 (×3): 81 mg via ORAL
  Filled 2019-05-02 (×3): qty 1

## 2019-05-02 NOTE — Evaluation (Signed)
Occupational Therapy Evaluation Patient Details Name: Lawrence Olson [name] [id number] DOB: 04-14-1951 Today's Date: 05/02/2019    History of Present Illness 68yo M out of hospital cardiac arrest with ROSC, intubated, found to have STEMI, taken emergently to cath lab. Downtime ~20 minutes with anoxic brain injury and significant encephalopathy.  Demonstrating posturing initially, then responding some to family when speaking [place].  Pt was on CVVHD as well and PT had to d/c pt for medical reasons on 9/4.  Reconsult placed and 2nd evaluations by PT/OT  completed 9/9   Clinical Impression   PTA patient independent. Admitted for above and limited by problem list below, including impaired cognition, R sided weakness/decreased coordination, impaired balance with heavy R lateral lean (and questionable L pushing), decreased activity tolerance.  He is oriented to person and place, able to follow 1 step commands inconsistently, poor problem solving, sequencing and motor planning. He requires max-total assist +2 for transfers using stedy, max assist for UB ADLs, and total assist +2 for LB ADLs.  He will benefit from continued OT services while admitted and after dc at intensive CIR level rehab in order to maximize return to PLOF with ADLs/mobility.     Follow Up Recommendations  CIR;Supervision/Assistance - 24 hour    Equipment Recommendations  3 in 1 bedside commode    Recommendations for Other Services Rehab consult;Speech consult     Precautions / Restrictions Precautions Precautions: Fall Restrictions Weight Bearing Restrictions: No      Mobility Bed Mobility Overal bed mobility: Needs Assistance Bed Mobility: Rolling;Sit to Supine Rolling: Mod assist;+2 for physical assistance     Sit to supine: Max assist;+2 for physical assistance   General bed mobility comments: Initially pt was in chair on bedpan - PT/OT assisted pt off bedpan when stood to [place].  Cleaned pt with total assist.   Asssisted LEs onto bed with max assist and cued pt to lie down to left UE.     Transfers Overall transfer level: Needs assistance   Transfers: Sit to/from Stand;Stand Pivot Transfers Sit to Stand: Max assist;Total assist;+2 physical assistance Stand pivot transfers: Total assist;+2 physical assistance       General transfer comment: Pt needed max assist for initial stand to [facility] with pt not standing all the way up.  Does not use UEs to pull up on STedy nor does he stand upright fully.  Pt also with right lateral lean significant at times needing total assist to sit up.   Moved pt to bed with pt sitting on STedy wtih total assist.  Last stand was Total assist +2 (pt =15%) due to pt fatigue with right lateral lean.      Balance Overall balance assessment: Needs assistance Sitting-balance support: Bilateral upper extremity supported;Feet supported Sitting balance-Leahy Scale: Zero Sitting balance - Comments: max to total assist due to pt pushing with left UE or not even attempting to use UEs.  He did have a brief time sitting EOB when he did get balance for a few seconds after PT working on propping on left elbow and sitting back to midline.   Postural control: Right lateral lean;Posterior lean Standing balance support: Bilateral upper extremity supported;During functional activity Standing balance-Leahy Scale: Zero Standing balance comment: cannot stand fully upright even with total asist off 2.                            ADL either performed or assessed with clinical judgement  ADL Overall ADL's : Needs assistance/impaired Eating/Feeding: NPO   Grooming: Maximal assistance;Sitting   Upper Body Bathing: Maximal assistance;Sitting   Lower Body Bathing: Total assistance;+2 for safety/equipment;+2 for physical assistance;Sit to/from stand   Upper Body Dressing : Total assistance;Sitting   Lower Body Dressing: +2 for physical assistance;Total assistance;+2 for  safety/equipment;Sit to/from stand   Toilet Transfer: +2 for physical assistance;+2 for safety/equipment;Maximal assistance;Total assistance;Stand-pivot Toilet Transfer Details (indicate cue type and reason): using stedy Toileting- Clothing Manipulation and Hygiene: Total assistance;+2 for physical assistance;+2 for safety/equipment;Sit to/from stand       Functional mobility during ADLs: Maximal assistance;Total assistance;+2 for safety/equipment;+2 for physical assistance General ADL Comments: pt limited by weakness, impaired balance, R lateral lean, cognition     Vision Baseline Vision/History: Wears glasses Wears Glasses: At all times Patient Visual Report: No change from baseline Additional Comments: further assessment, reports [facility] but noted undershooting when reaching      Perception     Praxis      Pertinent Vitals/Pain Pain Assessment: No/denies pain     Hand Dominance Right   Extremity/Trunk Assessment Upper Extremity Assessment Upper Extremity Assessment: RUE deficits/detail;LUE deficits/detail RUE Deficits / Details: PROM WFL, grossly 3-/5 MMT, decreased coordination and undershoorting  RUE Coordination: decreased fine motor;decreased gross motor LUE Deficits / Details: PROM WFL, grossly 3+/5 MMT, decreased coordination  LUE Coordination: decreased fine motor   Lower Extremity Assessment Lower Extremity Assessment: Defer to PT evaluation RLE Deficits / Details: Appears to be slightly weaker in right LE grossly 3+/5 LLE Deficits / Details: Grossly 4-/5   Cervical / Trunk Assessment Cervical / Trunk Assessment: Other exceptions Cervical / Trunk Exceptions: significant right lateral lean in sitting and standing   Communication Communication Communication: Prefers language other than [place];Other (comment)([place]ian but pt does know some [place])   Cognition Arousal/Alertness: Awake/alert Behavior During Therapy: Restless Overall Cognitive Status:  Impaired/Different from baseline Area of Impairment: Orientation;Memory;Following commands;Safety/judgement;Awareness;Problem solving;Attention                 Orientation Level: Disoriented to;Time(did not ask situation) Current Attention Level: Focused Memory: Decreased short-term memory;Decreased recall of precautions Following Commands: Follows one step commands with increased time Safety/Judgement: Decreased awareness of safety;Decreased awareness of deficits Awareness: Emergent Problem Solving: Difficulty sequencing;Requires verbal cues;Requires tactile cues;Decreased initiation;Slow processing General Comments: pt able to follow commands given increased time, slow procesing and poor sequencing; but no translator used during session   General Comments  VSS    Exercises     Shoulder Instructions      Home Living Family/patient expects to be discharged to:: Private residence Living Arrangements: Spouse/significant other;Children Available Help at Discharge: Family Type of Home: House Home Access: Stairs to enter Technical brewer of Steps: 1   Home Layout: Two level Alternate Level Stairs-Number of Steps: 10 Alternate Level Stairs-Rails: Right           Home Equipment: None          Prior Functioning/Environment Level of Independence: Independent                 OT Problem List: Decreased strength;Decreased activity tolerance;Impaired balance (sitting and/or standing);Decreased range of motion;Impaired vision/perception;Decreased coordination;Decreased cognition;Decreased safety awareness;Decreased knowledge of use of DME or AE;Decreased knowledge of precautions;Impaired UE functional use      OT Treatment/Interventions: Self-care/ADL training;DME and/or AE instruction;Energy conservation;Therapeutic activities;Balance training;Patient/family education;Visual/perceptual remediation/compensation;Cognitive remediation/compensation;Neuromuscular  education;Therapeutic exercise    OT Goals(Current goals can be found in the care plan section) Acute Rehab OT Goals  Patient Stated Goal: to recover fully OT Goal Formulation: With patient Time For Goal Achievement: 05/16/19 Potential to Achieve Goals: Good  OT Frequency: Min 2X/week   Barriers to D/C:            Co-evaluation PT/OT/SLP Co-Evaluation/Treatment: Yes Reason for Co-Treatment: Complexity of the patient's impairments (multi-system involvement);For patient/therapist safety;To address functional/ADL transfers PT goals addressed during session: Mobility/safety with mobility OT goals addressed during session: ADL's and self-care      AM-PAC OT "6 Clicks" Daily Activity     Outcome Measure Help from another person eating meals?: Total Help from another person taking care of personal grooming?: A Lot Help from another person toileting, which includes using toliet, bedpan, or urinal?: Total Help from another person bathing (including washing, rinsing, drying)?: A Lot Help from another person to put on and taking off regular upper body clothing?: Total Help from another person to put on and taking off regular lower body clothing?: Total 6 Click Score: 8   End of Session Equipment Utilized During Treatment: Gait belt;Other (comment)(stedy) Nurse Communication: Mobility status;Other (comment)(condom cath off)  Activity Tolerance: Patient tolerated treatment well Patient left: in bed;with call bell/phone within reach;with nursing/sitter in room;with family/visitor present  OT Visit Diagnosis: Other abnormalities of gait and mobility (R26.89);Muscle weakness (generalized) (M62.81);Other symptoms and signs involving cognitive function                Time: 1005-1039 OT Time Calculation (min): 34 min Charges:  OT General Charges $OT Visit: 1 Visit OT Evaluation $OT Eval Moderate Complexity: 1 Mod  Delight Stare, [place] Acute Rehabilitation Services Pager  [phone] Office [phone]   Delight Stare 05/02/2019, 1:10 PM

## 2019-05-02 NOTE — Progress Notes (Addendum)
NAMENathanial Olson, [name]  [id number], DOB:  April 16, 1951, LOS: 0 ADMISSION DATE:  04/16/2019, CONSULTATION DATE:  04/16/2019 REFERRING MD:  Claiborne Billings CHIEF COMPLAINT:  STEMI, resp failure  Brief History   68yo M out of hospital cardiac arrest with ROSC, intubated, found to have STEMI, taken emergently to cath lab. Proximal LAD lesion. PCCM consulted for management assistance.   Past Medical History   has no past medical history on file.   has a past surgical history that includes RIGHT HEART CATH AND CORONARY ANGIOGRAPHY (N/A, 04/16/2019) and VENTRICULAR ASSIST DEVICE INSERTION (N/A, 04/16/2019).  Significant Hospital Events   8/24: Cath Lab. PTCI unsuccessful despite multiple attempts.  RA 13 RV 42/16 PA 40/26 (31) PCWP 18 Pa sat 50%  Fick 2.7/1.3 CPO 0.6 Impella placed. VT /p same requiring defib.   8/27: Remains comatose with decorticate posturing.  Still on Impella support.  Weaning vasoactive drips.  At this point major question is neurological recovery.  Would need further cardiac intervention if he were to recover neurologically.  Neurology has been consulted. EEG - negative  8/28 - Impellla removed. Restart abx due to fevers  8/29 -  Daughter says he moves extremities in responds to [place] language.   8/30- Desats with mucus plugs, moving his head towards voice and opeening to voice and trying to focus  8/31 Bronchoscopy to eval for mucus plugs- Clean airways noted.   9/3 Dialysis cath placed. CRRT started 9/8 CRRT stopped in anticipation of  Transition to intermittent 9/8: extubation  Consults:  Cardiology PCCM Heart failure  Nephrology Neurology- signed off 9/9  Procedures:  8/24: PTCI unsuccessful 8/14: Impella L groin>> 8/24: 7.5 ETT>> 8/24: L IJ CVC >>> 8/28: Rt radial A line 8/31: Bronch with BAL 9/3: Dialysis cath 9/8: extubation  Significant Diagnostic Tests:  Echocardiogram 8/27: Well-positioned Impella cannula.  Continued severely depressed  LV function with anteroapical wall motion abnormality.    Echo 04/24/2019- severely depressed left ventricular systolic function with   global hypokinesis and anteroapical dyskinesis. LVEF 25-30%.  EEG 8/27: severe, diffuse encephalopathy without specific etiology EEG 9/7: cortical dysfunction in left temporal region. Mild diffuse encephalopathy. No seizures or epileptiform discharges identified. LTM 9/8: no indication of seizure activity  MRI brain 04/23/2019- punctate infarct in the caudate head.  No clear evidence of global hypoxic ischemic injury.   CXR 04/24/19: BILATERAL diffuse pulmonary infiltrates consistent with      pneumonia, perhaps slightly improved in the upper lobe  CXR 04/26/19: no interval change in interstitial and airspace opacities. Small pleural effusions present.  CXR 04/27/19: No significant interval change  Renal US 04/26/19: neg for hydronephrosis or stone    Renal US Micro Data:  COVID-19: Negative 8/24 blood cultures x2>>> negative Urine culture 8/27:>>> Negative MRSA swab: Positive BAL 8/31 > no growth 9/5 Blood culturex2> no growth  9/5 resp culture > no growth   Antimicrobials:  Vancomycin 8/24>>8/25; 8/28>>9/2; 9/5>>9/9 Cefepime 8/24>>8/26; 8/28>>8/30 Zosyn 8/30>>9/5 Meropenem 9/5>>9/9  Interim history/subjective:  Extubated yesterday morning. Awake and alert in room. Using interpreter to converse. Neurology at bedside this morning. Patient notes that his only concern is his weakness. We discussed that this is expected in light of his extended intubation/sedation. Noted that PT will help with improving strength. Also complains of difficulty getting uninterrupted sleep. We discussed that this should improve once being able to be moved off the unit. Neurology notes that they will be signing off as they do not suspect there was seizure activity present.  Objective    Today's Vitals   05/02/19 0300 05/02/19 0400 05/02/19 0500 05/02/19 0600  BP: 132/71  133/71 132/72 119/68  Pulse: 84 79 80 76  Resp: 11 (!) _0 Temp:  98.1 F (36.7 C)    TempSrc:  Oral    SpO2: 94% 95% 95% 95%  Weight:   84.7 kg   Height:      PainSc:  0-No pain     Vent Mode: PRVC FiO2 (%):  [40 %] 40 % Set Rate:  [21 bmp] 21 bmp Vt Set:  [510 mL] 510 mL PEEP:  [8 cmH20] 8 cmH20 Plateau Pressure:  [17 cmH20] 17 cmH20  CVP:  [2 mmHg-7 mmHg] 5 mmHg  Examination: Gen:      No acute distress.   HEENT:  Head atraumatic, no scleral icterus.hoarse voice but intact. Neck:      Left sided dialysis cath in place Lungs:    Breathing comfortably on room air. No accessory muscle use. Bibasilar crackles. Mild wheezes appreciated throughout CV:         Regular rate and regular rhythm; no murmurs.  No pitting edema appreciated today Abd:      + bowel sounds; soft, non-tender; no palpable masses, mildly distended Ext:  warm to touch.  Skin:      Warm and dry; no rash  Neurological Exam Mental Status Awake, alert and oriented to person, place and time. Recent and remote memory are intact. Speech: hoarse. Able to name objects, name parts of objects and repeat. Follows two-step commands.  Cranial Nerves CN II: Visual acuity is normal. Visual fields full to confrontation. CN III, IV, VI: Extraocular movements intact bilaterally. Normal lids and orbits bilaterally. Pupils equal round and reactive to light bilaterally. CN V: Facial sensation is normal. CN VII: Full and symmetric facial movement. CN VIII: Hearing is normal. CN IX, X: Palate elevates symmetrically. Normal gag reflex. CN XI: Shoulder shrug strength is normal. CN XII: Tongue midline without atrophy or fasciculations.  Motor  Normal muscle tone. Strength is 5/5 throughout all four extremities.  Sensory Light touch is normal in upper and lower extremities.     Resolved Hospital Problem list   N/A  Assessment & Plan:  69 year old male with no known PMH who presents to PCCM in the cath lab post cardiac  arrest while at work with bystander CPR started for a total of 24 minutes and 8 rounds of epi with initial presentation is VF and multiple shocks.  Patient was taken to the cath lab and found to have 100% proximal LAD lesion. PCI unsuccessful so impella placed (removed 8/28).    Acute systolic heart failure and cardiogenic shock status post cardiac arrest/ VF- r/t anterior MI, CAD with acute STEMI - 100% prox LAD  EF 25-30% with significant wall motion abnormalities.  Heart failure on board Impression:Heart failure appearing stable. Plan Cardiology recs: statin + aspirin for now Will need to hold off on on ACE/ARB/ARNI/beta-blocker due to renal function  Acute hypoxic respiratory failure -pulmonary edema secondary to MI, cardiac arrest, cardiogenic shock vs infection.  8/31 Neg blood (5d) and BAL cultures   9/5 Repeat resp and blood cultures showing no growth White count stable from yesterday--33. Steroids likely exacerbating this. Extubated 9/8. Tolerating extubation well. Breathing comfortably on room air with O2 sats >90% Impression:   Plan Will discontinue vanc and meropenem today given consistently neg cultures Will give one more dose of steroids today and then discontinue  AKI - suspicious for ATN. Dialysis dependent (CRRT started 9/3-9/8).  Nephrology following. CRRT stopped today with plans for intermittent dialysis in a couple days. Creatinine starting to increase since off CRRT Plan Will likely require dialysis over next couple of days  Acute encephalopathy Puntuate infarct of right caudal head. Neurology signed off 9/9 No concerning epileptiform signs on stat EEG. LTM neg for any epileptiform pattern Impression: much improved. Now extubated, he is answering questions appropriately.  Plan:  will get bedside swallow and possible formal swallow eval pending bedside results. PT/OT/ST  Hyperglycemia. Insulin drip restarted due to initiation of steroids for possible amio  induced pneumonitis. Plan: Continue insulin drip. Will likely be able to stop this following steroid discontinuation Anemia of critical illness. Hgb stable.  Transaminitis. Likely attributable to to liver shock. Slowly improving.   Overall, patient appears to be improving from a respiratory standpoint. Has been tolerating extubation very well since yesterday. Will likely need extended rehabilitation for weakness. PCCM will sign off. Please do not hesitate to reach out should further issues come up in the future. Thank you for allowing [place] to participate in Mr. Negron's care.  Best practice Sedation: none GI prophylaxis: pepcid DVT prophylaxis:heparin Glycemic control: insulin drip Lines:central line.  Dialysis cath  Family: per primary team   Mitzi Hansen, MD Internal Medicine Resident-PGY1 05/02/2019, 6:28 AM     Attending Note:  I have examined patient, reviewed labs, studies and notes.   S: Successfully extubated 9/8 EEG reassuring 9/8 as above, Keppra stopped  O: Vitals:   05/02/19 1109 05/02/19 1200 05/02/19 1300 05/02/19 1400  BP:  126/68 124/76 122/76  Pulse:  81 83 82  Resp:  (!) _0 Temp: 99 F (37.2 C)     TempSrc: Oral     SpO2:  95% 97% 95%  Weight:      Height:      Comfortable lying in bed.  Voice remains a little hoarse.  Awake, interacting appropriately, follows commands.  Communicates via interpreter.  Lungs are clear, distant.  Heart regular without a murmur.  Abdomen is soft, obese, nondistended with positive bowel sounds.  No significant lower extremity edema.  Acute respiratory failure due to bilateral infiltrates, treated for possible/presumed pneumonia as well as possible amiodarone (versus other) pneumonitis with corticosteroids. At this point believe we can stop his remaining antibiotics.  Would taper steroids to once daily and then decide duration.  Amiodarone has been stopped.  Acute renal failure.  Nephrology following and planning for  intermittent HD.  Unclear as to whether he will get renal recovery.  Questionable seizure activity.  EEG reassuring on 9/8.  Keppra discontinued.  Cardiology managing post anterior MI, associated cardiogenic arrest and shock.  Please call if we can assist in any way.  Baltazar Apo, MD, PhD 05/02/2019, 2:46 PM [facility] [phone] or if no answer [phone]

## 2019-05-02 NOTE — Progress Notes (Signed)
Patient ID: Lawrence Olson, male   DOB: [birth year], 68 y.o.   [name] [id number]   Advanced Heart Failure Team  Note   Primary Physician: Patient, No Pcp Per PCP-Cardiologist:  No primary care provider on file.  HPI:    Extubated yesterday. Sitting in chair. Communicative and following commands. Still fuzzy on some details but overall much improved.   Denies CP or SOB. Making urine. No fevers. CCM stopped abx. Co-ox 61% off milrinone.  Creatinine up to 3.0   Echo 04/24/19 EF 25-30% no LV thrombus   Objective:    Vital Signs:   Temp:  [98.1 F (36.7 C)-99.1 F (37.3 C)] 99 F (37.2 C) (09/09 1109) Pulse Rate:  [73-91] 82 (09/09 1400) Resp:  [8-17] 12 (09/09 1400) BP: (108-139)/(63-77) 122/76 (09/09 1400) SpO2:  [92 %-97 %] 95 % (09/09 1400) Weight:  [84.7 kg] 84.7 kg (09/09 0500) Last BM Date: 05/01/19  Weight change: Filed Weights   04/30/19 0357 05/01/19 0455 05/02/19 0500  Weight: 86.5 kg 87.4 kg 84.7 kg    Intake/Output:   Intake/Output Summary (Last 24 hours) at 05/02/2019 1418 Last data filed at 05/02/2019 1220 Gross per 24 hour  Intake 178.99 ml  Output 1100 ml  Net -921.01 ml      Physical Exam   General:  Sitting in chair. No resp difficulty HEENT: normal Neck: supple. RIJ trialysis Carotids 2+ bilat; no bruits. No lymphadenopathy or thryomegaly appreciated. Cor: PMI nondisplaced. Regular rate & rhythm. No rubs, gallops or murmurs. Lungs: coarse Abdomen: obese soft, nontender, nondistended. No hepatosplenomegaly. No bruits or masses. Good bowel sounds. Extremities: no cyanosis, clubbing, rash, edema Neuro: alert & orientedx2, cranial nerves grossly intact. moves all 4 extremities w/o difficulty. Affect pleasant   Telemetry   NSR 80s Personally reviewed  Labs   Basic Metabolic Panel: Recent Labs  Lab 04/27/19 0240  04/28/19 0319  04/29/19 0327  04/29/19 1620 04/30/19 0258 04/30/19 1600 05/01/19 0320 05/01/19 0611 05/02/19 0421  NA 145   < >  139   < > 135   < > 133* 136 136 134* 138 137  K 3.9   < > 4.0   < > 4.6   < > 5.5* 5.7* 4.4 4.1 4.3 3.9  CL 110   < > 104   < > 101  --  101 101 102 101  --  102  CO2 26   < > 26   < > 24  --  '23 24 25 25  ' --  24  GLUCOSE 195*   < > 163*   < > 163*  --  169* 284* 133* 172*  --  157*  BUN 88*   < > 51*   < > 44*  --  44* 48* 48* 49*  --  87*  CREATININE 2.80*   < > 1.86*   < > 1.90*  --  1.91* 2.00* 1.83* 1.82*  --  3.02*  CALCIUM 7.6*   < > 7.8*   < > 8.0*  --  7.9* 7.9* 8.0* 7.6*  --  8.1*  MG 2.7*  --  2.8*  --  2.8*  --   --  3.0*  --  2.9*  --   --   PHOS 3.7   < > 4.1   < > 4.5  --  3.9 5.8* 4.6 4.7*  --  6.4*   < > = values in this interval not displayed.    Liver Function Tests: Recent Labs  Lab 04/29/19 1620 04/30/19 0258 04/30/19 1600 05/01/19 0320 05/02/19 0421  ALBUMIN 2.0* 1.9* 2.2* 1.9* 2.2*   No results for input(s): LIPASE, AMYLASE in the last 168 hours. No results for input(s): AMMONIA in the last 168 hours.  CBC: Recent Labs  Lab 04/28/19 0319  04/29/19 0327 04/29/19 9892 04/30/19 0258 05/01/19 0320 05/01/19 0611 05/02/19 0421  WBC 27.2*  --  30.7*  --  27.4* 34.5*  --  33.0*  HGB 8.3*   < > 8.8* 9.9* 8.5* 8.1* 9.2* 8.4*  HCT 27.1*   < > 28.5* 29.0* 27.7* 25.9* 27.0* 26.8*  MCV 98.9  --  99.3  --  98.9 99.6  --  98.5  PLT 551*  --  630*  --  667* 699*  --  892*   < > = values in this interval not displayed.    Cardiac Enzymes: No results for input(s): CKTOTAL, CKMB, CKMBINDEX, TROPONINI in the last 168 hours.  BNP: BNP (last 3 results) No results for input(s): BNP in the last 8760 hours.  ProBNP (last 3 results) No results for input(s): PROBNP in the last 8760 hours.   CBG: Recent Labs  Lab 05/01/19 2059 05/01/19 2341 05/02/19 0342 05/02/19 0744 05/02/19 1108  GLUCAP 146* 204* 146* 153* 187*    Coagulation Studies: No results for input(s): LABPROT, INR in the last 72 hours.   Imaging   Dg Chest Port 1 View  Result Date:  05/02/2019 CLINICAL DATA:  68 year old male extubated. Status post STEMI and cardiac arrest in August. EXAM: PORTABLE CHEST 1 VIEW COMPARISON:  05/01/2019 and earlier. FINDINGS: Portable AP semi upright view at 0534 hours. Extubated. Enteric tube removed. Stable bilateral IJ central lines, dual lumen dialysis type catheter on the right. Stable cardiac size and mediastinal contours. Visualized tracheal air column is within normal limits. Mildly lower lung volumes. Perihilar and other bilateral Patchy and confluent pulmonary opacity persists. Ventilation has mildly decreased from yesterday, but improved since 04/29/2019. No pneumothorax or pleural effusion. Pulmonary vascularity within normal limits. IMPRESSION: 1. Extubated and enteric tube removed with mildly lower lung volumes. 2. Persistent patchy bilateral pulmonary opacity suspicious for multifocal infection. Ventilation has improved since 04/29/2019. Electronically Signed   By: Genevie Ann M.D.   On: 05/02/2019 08:30     Medications:     Current Medications: .  stroke: mapping our early stages of recovery book   Does not apply Once  . aspirin  81 mg Oral Daily  . atorvastatin  80 mg Oral q1800  . [START ON 05/03/2019] B-complex with vitamin C  1 tablet Oral Daily  . chlorhexidine  15 mL Mouth Rinse BID  . Chlorhexidine Gluconate Cloth  6 each Topical Daily  . famotidine  20 mg Oral Daily  . heparin injection (subcutaneous)  5,000 Units Subcutaneous Q8H  . insulin aspart  3-9 Units Subcutaneous Q4H  . insulin detemir  5 Units Subcutaneous Q12H  . mouth rinse  15 mL Mouth Rinse q12n4p  . QUEtiapine  100 mg Oral BID  . sodium chloride flush  10 mL Intravenous Q12H  . sodium chloride flush  10-40 mL Intracatheter Q12H    Infusions: . sodium chloride Stopped (05/02/19 0033)    Assessment/Plan   1. CAD: Anterolateral STEMI, cath 8/24 showed occluded proximal LAD, unable to open.  Once we see how he recovers neurologically, will need to decide  on feasibility of re-attempt PCI versus CABG. Medical therapy for the forseeable future. - No signs of ischemia -  Continue ASA + statin.   2. Cardiogenic shock: Ischemic cardiomyopathy.   - Echo with EF 25%, normal RV.  Impella removed 8/28.  Now remains on milrinone 0.25 - Co-ox 60 Milrinone stopped 9/6.  - No ACE/ARB?ARNI or b-blocker yet with AKI and shock - Volume status ok. Continue to follow off CVVHD  3. Acute hypoxic respiratory failure - CCM following.  - Suspect combination of HF and possible aspiration  - On broad spectrum abx and anti-fungal coverage. Will continue for now but suspect real issue may be pneumonitis  - Steroids started 9/6 for possible pneumonitis  (ESR 128/persistent diffuse lung infiltrates despite 20 pound volume removal). Amio stopped several days ago. - Extubated 9/8.  - Stable today  - Would wean steroids over a week as I do feel he had a component of ARDS/pneumonitis. D/w Dr. Lamonte Sakai   4. AKI:  -Suspect hemodynamic (ATN)   - CVVHD started 9/3 - Volume status looks good. Creatinine climbing - Continue to follow off CVVHD. Made 1.1 L of urine - Hopefully renal function will continue to improve. May need some iHD in the interim. Renal following   5. ID - ? Aspiration PNA and sinus disease (seen on CT) - On broad spectrum abx and anti-fungal coverage.all cx negative - Steroids started 9/6 for possible pneumonitis  (ESR 128/persistent diffuse lung infiltrates despite 20 pound volume removal). Amio stopped several days ago WBC 34k but suspect due to steroids.  - Does not appear to have c.diff  - All abx stopped yesterday. Agree.   5. Cardiac arrest/VF: 20 minutes downtime, concerned for anoxic brain injury.  Neurology following, per yesterday's note he seems to have intact brainstem function but minimal cortical function. EEG suggestive of diffuse encephalopathy. CT head without definite evidence for anoxic encephalopathy.  MRI without convincing evidence  of anoxic injury - Mental status much improved. Stil lwith mild anoxic encepholpathy but seems to be improving rapidly.  - Neuro input appreciated - Continue PT/OT/Speech (thanks!)  6. DVT prophylaxis: on heparin for CVVHD. Discussed dosing with PharmD personally.  7. Urinary retention - Foley. Wean as tolerated. Start Flomax  Glori Bickers MD 05/02/2019 2:18 PM

## 2019-05-02 NOTE — Progress Notes (Signed)
Neurology Progress Note   S:// Seen and examined. Extubated overnight.  Fully communicative and following commands.   O:// Current vital signs: BP 116/68   Pulse 82   Temp 99.1 F (37.3 C) (Oral)   Resp 10   Ht 5\' 6"  (1.676 m)   Wt 84.7 kg   SpO2 96%   BMI 30.14 kg/m  Vital signs in last 24 hours: Temp:  [98.1 F (36.7 C)-99.1 F (37.3 C)] 99.1 F (37.3 C) (09/09 0743) Pulse Rate:  [73-102] 82 (09/09 0937) Resp:  [8-20] 10 (09/09 0937) BP: (101-139)/(61-77) 116/68 (09/09 0937) SpO2:  [92 %-97 %] 96 % (09/09 0937) FiO2 (%):  [40 %] 40 % (09/08 1100) Weight:  [84.7 kg] 84.7 kg (09/09 0500) Neurological exam Extubated, no sedation, comfortably sitting in bed, video interpreter used for examination. Mental status: Aware of his name, could not tell date of birth or place but wants to go home. Speech and language: Speech is clear.  Repetition is intact.  Naming is intact.  Comprehension is intact. Attention concentration is mildly reduced Cranial nerves: Pupils equal round react light, extraocular movements intact, blinks to threat from both sides, grossly symmetric face and grossly symmetric facial sensation.  Tongue midline. Motor exam: Antigravity in both upper extremities and able to wiggle both toes to command and raise both legs with symmetric weakness in both legs. Sensory exam: Intact to touch all over Difficult to perform coordination due to slowness of movements.   Medications  Current Facility-Administered Medications:  .   stroke: mapping our early stages of recovery book, , Does not apply, Once, Greta Doom, MD .  0.9 %  sodium chloride infusion, , Intravenous, PRN, Troy Sine, MD, Stopped at 05/02/19 0033 .  aspirin chewable tablet 81 mg, 81 mg, Per Tube, Daily, Julian Hy, DO, 81 mg at 05/01/19 1009 .  atorvastatin (LIPITOR) tablet 80 mg, 80 mg, Per Tube, q1800, Larey Dresser, MD, 80 mg at 04/30/19 1744 .  B-complex with vitamin C  tablet 1 tablet, 1 tablet, Per Tube, Daily, Noemi Chapel P, DO, 1 tablet at 05/01/19 1010 .  chlorhexidine (PERIDEX) 0.12 % solution 15 mL, 15 mL, Mouth Rinse, BID, Byrum, Rose Fillers, MD, 15 mL at 05/02/19 0930 .  Chlorhexidine Gluconate Cloth 2 % PADS 6 each, 6 each, Topical, Daily, Troy Sine, MD, 6 each at 05/02/19 0000 .  famotidine (PEPCID) 40 MG/5ML suspension 20 mg, 20 mg, Per Tube, Daily, Agarwala, Ravi, MD, 20 mg at 05/01/19 1009 .  heparin injection 1,000-6,000 Units, 1,000-6,000 Units, CRRT, PRN, Rosita Fire, MD, 2,400 Units at 05/01/19 0858 .  heparin injection 5,000 Units, 5,000 Units, Subcutaneous, Q8H, Troy Sine, MD, 5,000 Units at 05/02/19 [phone] .  hydrALAZINE (APRESOLINE) injection 20 mg, 20 mg, Intravenous, BID PRN, Larey Dresser, MD, 20 mg at 04/26/19 0830 .  influenza vaccine adjuvanted (FLUAD) injection 0.5 mL, 0.5 mL, Intramuscular, Prior to discharge, Troy Sine, MD .  insulin aspart (novoLOG) injection 3-9 Units, 3-9 Units, Subcutaneous, Q4H, Troy Sine, MD, 6 Units at 05/02/19 [phone] .  insulin detemir (LEVEMIR) injection 5 Units, 5 Units, Subcutaneous, Q12H, Troy Sine, MD, 5 Units at 05/02/19 0932 .  levalbuterol (XOPENEX) nebulizer solution 0.63 mg, 0.63 mg, Nebulization, Q3H PRN, Anders Simmonds, MD .  MEDLINE mouth rinse, 15 mL, Mouth Rinse, q12n4p, Byrum, Rose Fillers, MD, 15 mL at 05/01/19 1600 .  ondansetron (ZOFRAN) injection 4 mg, 4 mg, Intravenous, Q6H  PRN, Troy Sine, MD .  QUEtiapine (SEROQUEL) tablet 100 mg, 100 mg, Per Tube, BID, Byrum, Rose Fillers, MD .  sodium chloride flush (NS) 0.9 % injection 10 mL, 10 mL, Intravenous, Q12H, Troy Sine, MD, 10 mL at 05/02/19 0933 .  sodium chloride flush (NS) 0.9 % injection 10 mL, 10 mL, Intravenous, PRN, Shelva Majestic A, MD .  sodium chloride flush (NS) 0.9 % injection 10-40 mL, 10-40 mL, Intracatheter, Q12H, Troy Sine, MD, 30 mL at 05/02/19 0933 .  sodium chloride flush (NS)  0.9 % injection 10-40 mL, 10-40 mL, Intracatheter, PRN, Troy Sine, MD .  Thrombi-Pad 3"X3" pad 1 each, 1 each, Topical, Once, Troy Sine, MD Labs CBC    Component Value Date/Time   WBC 33.0 (H) 05/02/2019 0421   RBC 2.72 (L) 05/02/2019 0421   HGB 8.4 (L) 05/02/2019 0421   HCT 26.8 (L) 05/02/2019 0421   PLT 892 (H) 05/02/2019 0421   MCV 98.5 05/02/2019 0421   MCH 30.9 05/02/2019 0421   MCHC 31.3 05/02/2019 0421   RDW 15.7 (H) 05/02/2019 0421   LYMPHSABS 7.2 (H) 04/16/2019 1335   MONOABS 0.8 04/16/2019 1335   EOSABS 0.0 04/16/2019 1335   BASOSABS 0.3 (H) 04/16/2019 1335    CMP     Component Value Date/Time   NA 137 05/02/2019 0421   K 3.9 05/02/2019 0421   CL 102 05/02/2019 0421   CO2 24 05/02/2019 0421   GLUCOSE 157 (H) 05/02/2019 0421   BUN 87 (H) 05/02/2019 0421   CREATININE 3.02 (H) 05/02/2019 0421   CALCIUM 8.1 (L) 05/02/2019 0421   PROT 5.9 (L) 04/22/2019 0248   ALBUMIN 2.2 (L) 05/02/2019 0421   AST 45 (H) 04/22/2019 0248   ALT 48 (H) 04/22/2019 0248   ALKPHOS 70 04/22/2019 0248   BILITOT 1.2 04/22/2019 0248   GFRNONAA 20 (L) 05/02/2019 0421   GFRAA 23 (L) 05/02/2019 0421    glycosylated hemoglobin  Lipid Panel     Component Value Date/Time   CHOL 167 04/16/2019 1335   TRIG 96 04/29/2019 0855   HDL 45 04/16/2019 1335   CHOLHDL 3.7 04/16/2019 1335   VLDL 20 04/16/2019 1335   LDLCALC 102 (H) 04/16/2019 1335     Imaging No new imaging  Assessment: 68 year old man status post cardiac arrest, prolonged encephalopathy concern for seizure-neurology recalled for possible seizures.  No significant seizures per report of EEG and LTM EEG. Prolonged encephalopathy, significantly improved. 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  Recommendations: No need for antiepileptics Supportive treatment and medical management per cardiology/PCCM as  you are. Neurology will be available as needed.   -- Amie Portland, MD [facility] Pager: [phone] If 7pm to 7am, please call on call as listed on AMION.

## 2019-05-02 NOTE — Progress Notes (Signed)
Used ipad interpreter to communicate with patient this AM and complete assessment. Dr. Rory Percy and Dr. Darrick Meigs at bedside. Answered all questions. Updated his daughter via phone.  Joellen Jersey, RN

## 2019-05-02 NOTE — Progress Notes (Signed)
Rehab Admissions Coordinator Note:  Patient was screened by Cleatrice Burke for appropriateness for an Inpatient Acute Rehab Consult per PT recs.  At this time, we are recommending Inpatient Rehab consult. Please place order for rehab consult.  Cleatrice Burke 05/02/2019, 12:58 PM  I can be reached at [phone].

## 2019-05-02 NOTE — Evaluation (Signed)
Clinical/Bedside Swallow Evaluation Patient Details  Name: Lawrence Olson [name] [id number] Date of Birth: 1951/06/21  Today's Date: 05/02/2019 Time: SLP Start Time (ACUTE ONLY): 1046 SLP Stop Time (ACUTE ONLY): 1104 SLP Time Calculation (min) (ACUTE ONLY): 18 min  Past Medical History: History reviewed. No pertinent past medical history. Past Surgical History:  Past Surgical History:  Procedure Laterality Date  . RIGHT HEART CATH AND CORONARY ANGIOGRAPHY N/A 04/16/2019   Procedure: RIGHT HEART CATH AND CORONARY ANGIOGRAPHY;  Surgeon: Troy Sine, MD;  Location: [facility];  Service: Cardiovascular;  Laterality: N/A;  . VENTRICULAR ASSIST DEVICE INSERTION N/A 04/16/2019   Procedure: VENTRICULAR ASSIST DEVICE INSERTION;  Surgeon: Troy Sine, MD;  Location: [facility];  Service: Cardiovascular;  Laterality: N/A;   HPI:  68 yo M out of hospital cardiac arrest with ROSC, intubated 8/24-9/8, found to have STEMI, taken emergently to cath lab. Downtime ~20 minutes with anoxic brain injury and significant encephalopathy.  Demonstrating posturing initially, then responding some to family when speaking [place].  No known medical hx,   Assessment / Plan / Recommendation Clinical Impression  Pt has immediate coughing with thin liquids, concerning for decreased airway protection in the setting of prolonged intubation. His voice sounds clear considering how long he had the ETT, but it is mildly dysphonic. Minimal overt s/s of aspiration are observed with purees and ice chips. He would benefit from [facility] prior to engaging in a full PO diet. In trying to coordinate this with radiology and nursing, it appears as though tomorrow morning is the earliest that this can be completed. In the interim, would provide him with meds in puree and intermittent ice chips to allow additional use of the swallowing mechanism. This, and additional time post-extubation, should help him to be more successful on  MBS. Will f/u on next date. SLP Visit Diagnosis: Dysphagia, unspecified (R13.10)    Aspiration Risk  Mild aspiration risk;Moderate aspiration risk    Diet Recommendation Ice chips PRN after oral care;NPO except meds   Medication Administration: Crushed with puree    Other  Recommendations Oral Care Recommendations: Oral care QID   Follow up Recommendations Inpatient Rehab      Frequency and Duration            Prognosis Prognosis for Safe Diet Advancement: Good      Swallow Study   General HPI: 68 yo M out of hospital cardiac arrest with ROSC, intubated 8/24-9/8, found to have STEMI, taken emergently to cath lab. Downtime ~20 minutes with anoxic brain injury and significant encephalopathy.  Demonstrating posturing initially, then responding some to family when speaking [place].  No known medical hx, Type of Study: Bedside Swallow Evaluation Previous Swallow Assessment: none in chart Diet Prior to this Study: NPO Temperature Spikes Noted: No Respiratory Status: Nasal cannula History of Recent Intubation: Yes Length of Intubations (days): 15 days Date extubated: 05/01/19 Behavior/Cognition: Alert;Cooperative;Pleasant mood Oral Cavity Assessment: Within Functional Limits Oral Care Completed by SLP: No Oral Cavity - Dentition: Missing dentition;Adequate natural dentition Vision: Functional for self-feeding Self-Feeding Abilities: Needs assist Patient Positioning: Upright in bed Baseline Vocal Quality: Hoarse(mild) Volitional Cough: Strong Volitional Swallow: Able to elicit    Oral/Motor/Sensory Function Overall Oral Motor/Sensory Function: Within functional limits   Ice Chips Ice chips: Impaired Presentation: Spoon Pharyngeal Phase Impairments: Cough - Delayed   Thin Liquid Thin Liquid: Impaired Presentation: Cup;Self Fed;Spoon Pharyngeal  Phase Impairments: Cough - Delayed    Nectar Thick Nectar Thick Liquid: Not  tested   Honey Thick Honey Thick Liquid: Not tested    Puree Puree: Within functional limits Presentation: Spoon   Solid     Solid: Not tested      [name] Garland Smouse 05/02/2019,1:45 PM  [name], M.A. [place] Acute Environmental education officer [phone] Office [phone]

## 2019-05-02 NOTE — Progress Notes (Signed)
[place] Progress Note Patient Name: Lawrence Olson DOB: [birth year] [name] [id number]   Date of Service  05/02/2019  HPI/Events of Note  Nursing request for CXR in AM.   eICU Interventions  Will order portable CXR in AM.     Intervention Category Major Interventions: Other:  [name],[name] Cornelia Copa 05/02/2019, 12:54 AM

## 2019-05-02 NOTE — Progress Notes (Signed)
Admit: 04/16/2019 LOS: 50  69M s/p STEMI-->Cardiac Arrest (unable to PCI prox LAD), ischemic ATN req CRRT, systolc CHF/cardiogenic shock, VDRF  Subjective:  . Off CRRT . Made 1.1 L of urine . Awake, alert, appropriate, I talked with patient via interpreter services . K3.9, BUN 87, bicarbonate 24, creatinine 3.0  09/08 0701 - 09/09 0700 In: 946.6 [I.V.:151.7; NG/GT:445; IV Piggyback:349.9] Out: 2876 [Urine:1125]  Filed Weights   04/30/19 0357 05/01/19 0455 05/02/19 0500  Weight: 86.5 kg 87.4 kg 84.7 kg    Scheduled Meds: .  stroke: mapping our early stages of recovery book   Does not apply Once  . aspirin  81 mg Per Tube Daily  . atorvastatin  80 mg Per Tube q1800  . B-complex with vitamin C  1 tablet Per Tube Daily  . chlorhexidine  15 mL Mouth Rinse BID  . Chlorhexidine Gluconate Cloth  6 each Topical Daily  . famotidine  20 mg Per Tube Daily  . heparin injection (subcutaneous)  5,000 Units Subcutaneous Q8H  . insulin aspart  3-9 Units Subcutaneous Q4H  . insulin detemir  5 Units Subcutaneous Q12H  . mouth rinse  15 mL Mouth Rinse q12n4p  . QUEtiapine  100 mg Per Tube BID  . sodium chloride flush  10 mL Intravenous Q12H  . sodium chloride flush  10-40 mL Intracatheter Q12H  . Thrombi-Pad  1 each Topical Once   Continuous Infusions: . sodium chloride Stopped (05/02/19 0033)   PRN Meds:.sodium chloride, heparin, hydrALAZINE, influenza vaccine adjuvanted, levalbuterol, ondansetron (ZOFRAN) IV, sodium chloride flush, sodium chloride flush  Current Labs: reviewed   Physical Exam:  Blood pressure 116/68, pulse 82, temperature 99.1 F (37.3 C), temperature source Oral, resp. rate 10, height 5\' 6"  (1.676 m), weight 84.7 kg, SpO2 96 %. Awake, alert RRR, normal S1 and S2, no rub Coarse breath sounds bilaterally, normal work of breathing Trace to 1+ edema Soft, nontender Nonfocal, CN II through XII grossly intact  A 1. Nonoliguric dialysis dependent ischemic ATN, requiring  CRRT 9/3 to 9/8:  Follow labs and urine output for the next 24 hours, some suggestion of recovery of kidney function.  Had a fairly normal creatinine at the time of presentation, potentially could be recovering.  Might need some dialytic support before that, none indicated for today. 2. Status post cardiac arrest, STEMI with lesion not amenable to PCI, ischemic cardiomyopathy with systolic heart failure and shock.  As above.  Advanced heart failure following.  Much improved. 3. 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 Anemia, hemoglobin stable  P . As above, follow labs and UOP for the next 24 hours . Medication Issues; o Preferred narcotic agents for pain control are hydromorphone, fentanyl, and methadone. Morphine should not be used.  o Baclofen should be avoided o Avoid oral sodium phosphate and magnesium citrate based laxatives / bowel preps    Pearson Grippe MD 05/02/2019, 10:00 AM  Recent Labs  Lab 04/30/19 1600 05/01/19 0320 05/01/19 0611 05/02/19 0421  NA 136 134* 138 137  K 4.4 4.1 4.3 3.9  CL 102 101  --  102  CO2 25 25  --  24  GLUCOSE 133* 172*  --  157*  BUN 48* 49*  --  87*  CREATININE 1.83* 1.82*  --  3.02*  CALCIUM 8.0* 7.6*  --  8.1*  PHOS 4.6 4.7*  --  6.4*   Recent Labs  Lab 04/30/19 0258 05/01/19 0320 05/01/19 8115 05/02/19 0421  WBC 27.4* 34.5*  --  33.0*  HGB 8.5* 8.1* 9.2* 8.4*  HCT 27.7* 25.9* 27.0* 26.8*  MCV 98.9 99.6  --  98.5  PLT 667* 699*  --  892*

## 2019-05-02 NOTE — Evaluation (Signed)
Physical Therapy Evaluation Patient Details Name: Lawrence Olson [name] [id number] DOB: Mar 06, 1951 Today's Date: 05/02/2019   History of Present Illness  68yo M out of hospital cardiac arrest with ROSC, intubated, found to have STEMI, taken emergently to cath lab. Downtime ~20 minutes with anoxic brain injury and significant encephalopathy.  Demonstrating posturing initially, then responding some to family when speaking [place].  Pt was on CVVHD as well and PT had to d/c pt for medical reasons on 9/4.  Reconsult placed and 2nd evaluations by PT/OT  completed 9/9  Clinical Impression  Pt admitted with above diagnosis. Pt was able to participate in therapy but fatigues quickly.  Very poor sitting and standing balance.  Poor motor processing aas well. Pt would benefit from rehab consult.  Will follow acutely.  Pt currently with functional limitations due to the deficits listed below (see PT Problem List). Pt will benefit from skilled PT to increase their independence and safety with mobility to allow discharge to the venue listed below.     Follow Up Recommendations CIR;Supervision/Assistance - 24 hour    Equipment Recommendations  Other (comment)(TBA)    Recommendations for Other Services Rehab consult     Precautions / Restrictions Precautions Precautions: Fall Restrictions Weight Bearing Restrictions: No      Mobility  Bed Mobility Overal bed mobility: Needs Assistance Bed Mobility: Rolling;Sit to Supine Rolling: Mod assist;+2 for physical assistance     Sit to supine: Max assist;+2 for physical assistance   General bed mobility comments: Initially pt was in chair on bedpan - PT/OT assisted pt off bedpan when stood to [place].  Cleaned pt with total assist.  Asssisted LEs onto bed with max assist and cued pt to lie down to left UE.     Transfers Overall transfer level: Needs assistance   Transfers: Sit to/from Stand;Stand Pivot Transfers Sit to Stand: Max assist;Total assist;+2  physical assistance Stand pivot transfers: Total assist;+2 physical assistance       General transfer comment: Pt needed max assist for initial stand to [facility] with pt not standing all the way up.  Does not use UEs to pull up on STedy nor does he stand upright fully.  Pt also with right lateral lean significant at times needing total assist to sit up.   Moved pt to bed with pt sitting on STedy wtih total assist.  Last stand was Total assist +2 (pt =15%) due to pt fatigue with right lateral lean.    Ambulation/Gait             General Gait Details: pt unable   Stairs            Wheelchair Mobility    Modified Rankin (Stroke Patients Only)       Balance Overall balance assessment: Needs assistance Sitting-balance support: Bilateral upper extremity supported;Feet supported Sitting balance-Leahy Scale: Zero Sitting balance - Comments: max to total assist due to pt pushing with left UE or not even attempting to use UEs.  He did have a brief time sitting EOB when he did get balance for a few seconds after PT working on propping on left elbow and sitting back to midline.   Postural control: Right lateral lean;Posterior lean Standing balance support: Bilateral upper extremity supported;During functional activity Standing balance-Leahy Scale: Zero Standing balance comment: cannot stand fully upright even with total asist off 2.  Pertinent Vitals/Pain Pain Assessment: No/denies pain    Home Living Family/patient expects to be discharged to:: Private residence Living Arrangements: Spouse/significant other;Children Available Help at Discharge: Family Type of Home: House Home Access: Stairs to enter   Technical brewer of Steps: 1 Home Layout: Two level Home Equipment: None      Prior Function Level of Independence: Independent               Hand Dominance        Extremity/Trunk Assessment   Upper Extremity  Assessment Upper Extremity Assessment: Defer to OT evaluation    Lower Extremity Assessment Lower Extremity Assessment: Generalized weakness RLE Deficits / Details: Appears to be slightly weaker in right LE grossly 3+/5 LLE Deficits / Details: Grossly 4-/5    Cervical / Trunk Assessment Cervical / Trunk Assessment: Other exceptions Cervical / Trunk Exceptions: significant right lateral lean in sitting and standing  Communication   Communication: Prefers language other than [place];Other (comment)([place]ian but pt does know some [place])  Cognition Arousal/Alertness: Awake/alert Behavior During Therapy: Restless Overall Cognitive Status: Impaired/Different from baseline Area of Impairment: Orientation;Memory;Following commands;Safety/judgement;Awareness;Problem solving                 Orientation Level: Disoriented to;Time(did not ask situation)   Memory: Decreased short-term memory Following Commands: Follows one step commands with increased time Safety/Judgement: Decreased awareness of safety;Decreased awareness of deficits   Problem Solving: Difficulty sequencing;Requires verbal cues;Requires tactile cues;Decreased initiation;Slow processing        General Comments General comments (skin integrity, edema, etc.): wife came in at end of treatment    Exercises     Assessment/Plan    PT Assessment Patient needs continued PT services  PT Problem List Decreased mobility;Decreased cognition;Cardiopulmonary status limiting activity;Decreased activity tolerance;Decreased strength       PT Treatment Interventions Functional mobility training;Therapeutic exercise;Patient/family education;DME instruction;Therapeutic activities;Balance training    PT Goals (Current goals can be found in the Care Plan section)  Acute Rehab PT Goals Patient Stated Goal: to recover fully PT Goal Formulation: With patient/family Time For Goal Achievement: 05/16/19 Potential to Achieve Goals:  Fair    Frequency Min 3X/week   Barriers to discharge        Co-evaluation PT/OT/SLP Co-Evaluation/Treatment: Yes Reason for Co-Treatment: Complexity of the patient's impairments (multi-system involvement);For patient/therapist safety PT goals addressed during session: Mobility/safety with mobility         AM-PAC PT "6 Clicks" Mobility  Outcome Measure Help needed turning from your back to your side while in a flat bed without using bedrails?: A Lot Help needed moving from lying on your back to sitting on the side of a flat bed without using bedrails?: Total Help needed moving to and from a bed to a chair (including a wheelchair)?: Total Help needed standing up from a chair using your arms (e.g., wheelchair or bedside chair)?: Total Help needed to walk in hospital room?: Total Help needed climbing 3-5 steps with a railing? : Total 6 Click Score: 7    End of Session Equipment Utilized During Treatment: Gait belt;Oxygen Activity Tolerance: Patient limited by fatigue Patient left: in bed;with call bell/phone within reach;with family/visitor present Nurse Communication: Mobility status;Need for lift equipment PT Visit Diagnosis: Other abnormalities of gait and mobility (R26.89);Other symptoms and signs involving the nervous system (R29.898)    Time: 7001-7494 PT Time Calculation (min) (ACUTE ONLY): 34 min   Charges:   PT Evaluation $PT Eval Moderate Complexity: 1 Mod  Addi Pak,PT Acute Rehabilitation Services Pager:  [phone]  Office:  [place] 05/02/2019, 11:18 AM

## 2019-05-02 NOTE — Plan of Care (Signed)
  Problem: Clinical Measurements: Goal: Ability to maintain clinical measurements within normal limits will improve 05/02/2019 0921 by Boris Lown, RN Outcome: Progressing Goal: Respiratory complications will improve 05/02/2019 0921 by Boris Lown, RN Outcome: Progressing Goal: Cardiovascular complication will be avoided 05/02/2019 0921 by Boris Lown, RN Outcome: Progressing  Problem: Activity: Goal: Risk for activity intolerance will decrease 05/02/2019 0921 by Boris Lown, RN Outcome: Progressing Note: Assisted to chair with steady this AM. PT/OT consulted.   Problem: Elimination: Goal: Will not experience complications related to bowel motility 05/02/2019 0921 by Boris Lown, RN Outcome: Progressing Goal: Will not experience complications related to urinary retention 05/02/2019 0921 by Boris Lown, RN Outcome: Progressing   Problem: Pain Managment: Goal: General experience of comfort will improve 05/02/2019 0921 by Boris Lown, RN Outcome: Progressing   Problem: Skin Integrity: Goal: Risk for impaired skin integrity will decrease 05/02/2019 0921 by Boris Lown, RN Outcome: Progressing   Problem: Nutrition: Goal: Adequate nutrition will be maintained 05/02/2019 0921 by Boris Lown, RN Outcome: Not Progressing Note: Waiting on speech eval.

## 2019-05-03 ENCOUNTER — Inpatient Hospital Stay (HOSPITAL_COMMUNITY): Payer: PPO

## 2019-05-03 LAB — RENAL FUNCTION PANEL
Albumin: 2.2 g/dL — ABNORMAL LOW (ref 3.5–5.0)
Anion gap: 12 (ref 5–15)
BUN: 111 mg/dL — ABNORMAL HIGH (ref 8–23)
CO2: 23 mmol/L (ref 22–32)
Calcium: 8.3 mg/dL — ABNORMAL LOW (ref 8.9–10.3)
Chloride: 108 mmol/L (ref 98–111)
Creatinine, Ser: 3.2 mg/dL — ABNORMAL HIGH (ref 0.61–1.24)
GFR calc Af Amer: 22 mL/min — ABNORMAL LOW (ref >60)
GFR calc non Af Amer: 19 mL/min — ABNORMAL LOW (ref >60)
Glucose, Bld: 149 mg/dL — ABNORMAL HIGH (ref 70–99)
Phosphorus: 7.9 mg/dL — ABNORMAL HIGH (ref 2.5–4.6)
Potassium: 4.3 mmol/L (ref 3.5–5.1)
Sodium: 143 mmol/L (ref 135–145)

## 2019-05-03 LAB — GLUCOSE, CAPILLARY
Glucose-Capillary: 127 mg/dL — ABNORMAL HIGH (ref 70–99)
Glucose-Capillary: 117 mg/dL — ABNORMAL HIGH (ref 70–99)
Glucose-Capillary: 249 mg/dL — ABNORMAL HIGH (ref 70–99)
Glucose-Capillary: 180 mg/dL — ABNORMAL HIGH (ref 70–99)
Glucose-Capillary: 228 mg/dL — ABNORMAL HIGH (ref 70–99)

## 2019-05-03 LAB — COOXEMETRY PANEL
Carboxyhemoglobin: 1.5 % (ref 0.5–1.5)
Methemoglobin: 0.9 % (ref 0.0–1.5)
O2 Saturation: 72.2 %
Total hemoglobin: 8.3 g/dL — ABNORMAL LOW (ref 12.0–16.0)

## 2019-05-03 MED ORDER — ENSURE ENLIVE PO LIQD
237.0000 mL | Freq: Two times a day (BID) | ORAL | Status: DC
  Administered 2019-05-03 – 2019-05-04 (×3): 237 mL via ORAL

## 2019-05-03 MED ORDER — CARVEDILOL 3.125 MG PO TABS
3.1250 mg | ORAL_TABLET | Freq: Two times a day (BID) | ORAL | Status: DC
  Administered 2019-05-03 – 2019-05-04 (×4): 3.125 mg via ORAL
  Filled 2019-05-03 (×4): qty 1

## 2019-05-03 MED ORDER — PREDNISONE 20 MG PO TABS
40.0000 mg | ORAL_TABLET | Freq: Every day | ORAL | Status: DC
  Administered 2019-05-03 – 2019-05-04 (×2): 40 mg via ORAL
  Filled 2019-05-03 (×2): qty 2

## 2019-05-03 MED ORDER — CHLORHEXIDINE GLUCONATE CLOTH 2 % EX PADS: 6.0000 | MEDICATED_PAD | Freq: Every day | CUTANEOUS | Status: DC

## 2019-05-03 NOTE — PMR Pre-admission (Signed)
PMR Admission Coordinator Pre-Admission Assessment  Patient: Lawrence Olson is an 68 y.o., male [name] [id number] DOB: 1950-12-06 Height: '5\' 6"'  (167.6 cm) Weight: 82.3 kg  Insurance Information HMO:     PPO: yes     PCP:      IPA:      80/20:      OTHER:  PRIMARY: [employer]      Policy#: [id number]      Subscriber: Patient CM Name: Lawrence Olson      Phone#: [phone]     Fax#: [phone] Pre-Cert#: [id number]      Employer:  [name] provided by Lawrence Olson on 9/11 for admit to CIR 9/11. Pt is approved for 5 days with pt to be reassessed for appropriateness on day 5, 9/15. Auth [id number]. HTA has Epic access. Per Lawrence Olson, any weekend needs can call [phone] Benefits:  Phone #: [phone], option 1     Name: Lawrence Olson. Date: 08/23/2018 - still active     Deduct: $0      Out of Pocket Max: $3,400 ($60.00 met)      Life Max:  CIR: $295/day co-pay for days 1-6, $0/day for days 7-90.       SNF: $20/day co-pay for days 1-20, $160/day co-pay for days 21-100; limited to 100 days/benefit period  Outpatient: limited by medical necessity     Co-Pay: $15/visit co-pay  Home Health: 100% coverage; limited by medical necessity      Co-Pay: 0% co-insurance DME: 80% coverage     Co-Pay: 20% co-insurance.  Providers:  SECONDARY: None       Policy#:       Subscriber:  CM Name:       Phone#:      Fax#:  Pre-Cert#:       Employer:  Benefits:  Phone #:      Name:  Olson. Date:      Deduct:       Out of Pocket Max:       Life Max:  CIR:       SNF:  Outpatient:      Co-Pay:  Home Health:       Co-Pay:  DME:      Co-Pay:   Medicaid Application Date:       Case Manager:  Disability Application Date:       Case Worker:   The "Data Collection Information Summary" for patients in Inpatient Rehabilitation Facilities with attached "Privacy Act [place] Records" was provided and verbally reviewed with: Family  Emergency Contact Information Contact Information    Name Relation Home Work Mobile    Lawrence Olson Spouse [phone]  [phone]   Lawrence Olson Daughter   [phone]      Current Medical History  Patient Admitting Diagnosis: Debility after cardiac arrest and ABI  History of Present Illness: Lawrence Olson is a 68 year old male with no prior medical issues who was admitted on 04/16/19 after witnessed cardiac arrest at work. Question of onlooker CPR-- he was found to have VT and required CPR/ACLS protocol approx 20 minutes with king airway placed by EMS. He was intubated in ED, noted to be severely hypoxic and hypercapneic and found to have ST elevation due to MI. Despite prolonged downtime, some purposeful movement noted and he was taken to cath lab for emergent catheterization. Attempts at opening LAD unsuccessful with development of VT and impella placed with pressors for cardiogenic shock.  Hospital course significant for oozing from left IJ as well as issues with  hematuria, fevers treated with empiric antibiotics, ectopy requiring amiodarone as well as need for suture repair of right femoral arterial puncture site as well as L-IJ central line.   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 He was started on IV keppra due to high risk of seizures and EEG done revealing moderate to severe diffuse encephalopathy.   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 Stroke question due to small vessel disease v/s embolic and ASA recommended for secondary stroke prevention.  As mentation started improving without definate seizures, keppra was discontinued on 09/01.  Fluid overload treated with IV diuresis but he developed multifactorial AKI likely due to ATN. Dr. Noel Journey consulted 9/03  and CRRT initiated due to ongoing issues with overload. He continued to have issues with fevers and Vanc  resumed/meropenum added 9/6. He has had issues with delirium and agitation --LTM EEG negative for seizures and prolonged encephalopathy felt to be due to ischemic BI. 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 Antibiotics d/c as cultures negative and CRRT discontinued as fluid status improved with good UOP.  Renal status improving. Follow up echo with EF 25-30% and Life Vest to be ordered.  MBS done 9/10 and he was started on dysphagia 2, thins.  Therapy ongoing and patient noted to be limited by signifiant debility. CIR recommended due to functional deccline. Pt is to be admitted to CIR on 05/04/2019.    Complete NIHSS TOTAL: 6  Patient's medical record from [facility] has been reviewed by the rehabilitation admission coordinator and physician.  Past Medical History  Past Medical History:  Diagnosis Date  . Jaundice    needed hospitalization in [place]    Family History   family history includes Healthy in his brother and sister.  Prior Rehab/Hospitalizations Has the patient had prior rehab or hospitalizations prior to admission? No  Has the patient had major surgery during 100 days prior to admission? Yes   Current Medications  Current Facility-Administered Medications:  .   stroke: mapping our early stages of recovery book, , Does not apply, Once, Bensimhon, Shaune Pascal, MD .  0.9 %  sodium chloride infusion, , Intravenous, PRN, Bensimhon, Shaune Pascal, MD, Stopped at 05/02/19 0033 .  aspirin chewable tablet 81 mg, 81 mg, Oral, Daily, Bensimhon, Shaune Pascal, MD, 81 mg at 05/04/19 0927 .  atorvastatin (LIPITOR) tablet 80 mg, 80 mg, Oral, q1800, Bensimhon, Shaune Pascal, MD, 80 mg at 05/03/19 1633 .  B-complex with vitamin C tablet 1 tablet, 1 tablet, Oral, Daily, Bensimhon, Shaune Pascal, MD, 1 tablet at 05/04/19 0927 .  carvedilol (COREG) tablet 3.125 mg, 3.125 mg, Oral, BID WC,  Bensimhon, Shaune Pascal, MD, 3.125 mg at 05/04/19 0927 .  chlorhexidine (PERIDEX) 0.12 % solution 15 mL, 15 mL, Mouth Rinse, BID, Bensimhon, Shaune Pascal, MD, 15 mL at 05/04/19 0927 .  Chlorhexidine Gluconate Cloth 2 % PADS 6 each, 6 each, Topical, Daily, Troy Sine, MD .  famotidine (PEPCID) tablet 20 mg, 20 mg, Oral, Daily, Bensimhon, Shaune Pascal, MD, 20 mg at 05/04/19 0927 .  feeding supplement (ENSURE ENLIVE) (ENSURE ENLIVE) liquid 237 mL, 237 mL, Oral, BID BM, Troy Sine, MD, 237 mL at 05/04/19 0928 .  heparin injection 1,000-6,000 Units, 1,000-6,000  Units, CRRT, PRN, Bensimhon, Shaune Pascal, MD, 2,400 Units at 05/01/19 0858 .  heparin injection 5,000 Units, 5,000 Units, Subcutaneous, Q8H, Bensimhon, Shaune Pascal, MD, 5,000 Units at 05/04/19 0650 .  hydrALAZINE (APRESOLINE) injection 20 mg, 20 mg, Intravenous, BID PRN, Bensimhon, Shaune Pascal, MD, 20 mg at 04/26/19 0830 .  influenza vaccine adjuvanted (FLUAD) injection 0.5 mL, 0.5 mL, Intramuscular, Prior to discharge, Bensimhon, Shaune Pascal, MD .  insulin aspart (novoLOG) injection 3-9 Units, 3-9 Units, Subcutaneous, Q4H, Bensimhon, Shaune Pascal, MD, 3 Units at 05/04/19 1237 .  insulin detemir (LEVEMIR) injection 5 Units, 5 Units, Subcutaneous, Q12H, Bensimhon, Shaune Pascal, MD, 5 Units at 05/04/19 0925 .  levalbuterol (XOPENEX) nebulizer solution 0.63 mg, 0.63 mg, Nebulization, Q3H PRN, Bensimhon, Shaune Pascal, MD .  MEDLINE mouth rinse, 15 mL, Mouth Rinse, q12n4p, Bensimhon, Shaune Pascal, MD, 15 mL at 05/04/19 1249 .  ondansetron (ZOFRAN) injection 4 mg, 4 mg, Intravenous, Q6H PRN, Bensimhon, Shaune Pascal, MD .  predniSONE (DELTASONE) tablet 40 mg, 40 mg, Oral, Q breakfast, Bensimhon, Shaune Pascal, MD, 40 mg at 05/04/19 0926 .  QUEtiapine (SEROQUEL) tablet 50 mg, 50 mg, Oral, BID, Bensimhon, Shaune Pascal, MD .  sodium chloride flush (NS) 0.9 % injection 10 mL, 10 mL, Intravenous, Q12H, Bensimhon, Shaune Pascal, MD, 10 mL at 05/04/19 0928 .  sodium chloride flush (NS) 0.9 % injection 10 mL,  10 mL, Intravenous, PRN, Bensimhon, Daniel R, MD .  sodium chloride flush (NS) 0.9 % injection 10-40 mL, 10-40 mL, Intracatheter, Q12H, Bensimhon, Shaune Pascal, MD, 10 mL at 05/04/19 0928 .  sodium chloride flush (NS) 0.9 % injection 10-40 mL, 10-40 mL, Intracatheter, PRN, Bensimhon, Shaune Pascal, MD, 10 mL at 05/03/19 2116 .  tamsulosin (FLOMAX) capsule 0.4 mg, 0.4 mg, Oral, QPC supper, Bensimhon, Shaune Pascal, MD, 0.4 mg at 05/03/19 1633  Patients Current Diet:  Diet Order            Diet - low sodium heart healthy        DIET DYS 2 Room service appropriate? Yes with Assist; Fluid consistency: Thin  Diet effective now              Precautions / Restrictions Precautions Precautions: Fall Precaution Comments: intubated Other Brace: mitt on R hand Restrictions Weight Bearing Restrictions: No RLE Weight Bearing: Non weight bearing LLE Weight Bearing: Non weight bearing   Has the patient had 2 or more falls or a fall with injury in the past year? No  Prior Activity Level Community (5-7x/wk): very active PTA. worked full time at [employer]; drove PTA; was Independent PTA  Prior Functional Level Self Care: Did the patient need help bathing, dressing, using the toilet or eating? Independent  Indoor Mobility: Did the patient need assistance with walking from room to room (with or without device)? Independent  Stairs: Did the patient need assistance with internal or external stairs (with or without device)? Independent  Functional Cognition: Did the patient need help planning regular tasks such as shopping or remembering to take medications? Independent  Home Assistive Devices / Equipment Home Assistive Devices/Equipment: None Home Equipment: None  Prior Device Use: Indicate devices/aids used by the patient prior to current illness, exacerbation or injury? None of the above  Current Functional Level Cognition  Overall Cognitive Status: Impaired/Different from baseline Difficult to assess  due to: Intubated, Non-English speaking Current Attention Level: Focused Orientation Level: Oriented to person, Oriented to place, Oriented to situation, Disoriented to time Following Commands: Follows one step commands  with increased time Safety/Judgement: Decreased awareness of safety, Decreased awareness of deficits General Comments: pt able to follow commands given increased time, slow procesing and poor sequencing; translator Tatjana # [id number] used.     Extremity Assessment (includes Sensation/Coordination)  Upper Extremity Assessment: RUE deficits/detail, LUE deficits/detail RUE Deficits / Details: PROM WFL, grossly 3-/5 MMT, decreased coordination and undershoorting  RUE Coordination: decreased fine motor, decreased gross motor LUE Deficits / Details: PROM WFL, grossly 3+/5 MMT, decreased coordination  LUE Coordination: decreased fine motor  Lower Extremity Assessment: Defer to PT evaluation RLE Deficits / Details: Appears to be slightly weaker in right LE grossly 3+/5 LLE Deficits / Details: Grossly 4-/5    ADLs  Overall ADL's : Needs assistance/impaired Eating/Feeding: NPO Grooming: Maximal assistance, Sitting Upper Body Bathing: Maximal assistance, Sitting Lower Body Bathing: Total assistance, +2 for safety/equipment, +2 for physical assistance, Sit to/from stand Upper Body Dressing : Total assistance, Sitting Lower Body Dressing: +2 for physical assistance, Total assistance, +2 for safety/equipment, Sit to/from stand Toilet Transfer: +2 for physical assistance, +2 for safety/equipment, Maximal assistance, Total assistance, Stand-pivot Toilet Transfer Details (indicate cue type and reason): using stedy Toileting- Clothing Manipulation and Hygiene: Total assistance, +2 for physical assistance, +2 for safety/equipment, Sit to/from stand Functional mobility during ADLs: Maximal assistance, Total assistance, +2 for safety/equipment, +2 for physical assistance General ADL Comments:  pt limited by weakness, impaired balance, R lateral lean, cognition    Mobility  Overal bed mobility: Needs Assistance Bed Mobility: Rolling, Sidelying to Sit Rolling: Mod assist, +2 for physical assistance Sidelying to sit: Max assist, +2 for physical assistance Sit to supine: Max assist, +2 for physical assistance General bed mobility comments: Pt assisted needing assist for trunk elevation.  Pt needed assist to move hips to EOB as well. Does not use his UEs as he should to assist with mobility.     Transfers  Overall transfer level: Needs assistance Transfer via Lift Equipment: Stedy Transfers: Sit to/from Stand, Risk manager Sit to Stand: +2 physical assistance, Mod assist, Max assist Stand pivot transfers: Total assist, +2 physical assistance General transfer comment: Pt needed mod assist for initial stand to [facility] with pt not standing all the way up.  Does not use UEs to pull up on STedy nor does he stand upright fully.  Pt also with right lateral lean significant at times needing total assist to sit up.   Moved pt to recliner with pt sitting on STedy needing  total assist as he leans so heavy to right and anterior and even with interpreter would not sit up.  Last stand was max assist due to pt fatigue with right lateral lean.  Pt told interpreter "I can't get my body to do what I want. Will that iimprove?"    Ambulation / Gait / Stairs / Emergency planning/management officer  Ambulation/Gait General Gait Details: pt unable     Posture / Balance Dynamic Sitting Balance Sitting balance - Comments: max to total assist due to pt pushing with left UE or not even attempting to use UEs.  He did have a brief time sitting EOB when he did get balance for a few seconds after PT working on propping on left elbow and sitting back to midline.   Balance Overall balance assessment: Needs assistance Sitting-balance support: Bilateral upper extremity supported, Feet supported Sitting balance-Leahy Scale:  Zero Sitting balance - Comments: max to total assist due to pt pushing with left UE or not even attempting to use UEs.  He did have a brief time sitting EOB when he did get balance for a few seconds after PT working on propping on left elbow and sitting back to midline.   Postural control: Right lateral lean, Posterior lean Standing balance support: Bilateral upper extremity supported, During functional activity Standing balance-Leahy Scale: Zero Standing balance comment: cannot stand fully upright even with total assist of 2.     Special needs/care consideration BiPAP/CPAP : no CPM : no Continuous Drip IV : none Dialysis : Not needed at this time      Life Vest : yes, has been ordered. Note yet arrived at time of admission to CIR. Per Dr. Haroldine Laws, the "patient OK to transfer to CIR prior to LifeVest placement." Oxygen : 2L/min [place] Special Bed : no Trach Size : no Wound Vac (area) : no      Location : no Skin : abrasion to right lower abdomen, ecchymosis to bilateral groin, flank; pressure injury to head on left side (unstageable).                  Bowel mgmt: continent, last BM 05/02/2019 Bladder mgmt: external urinary catheter Diabetic mgmt: has had issues with blood sugar per daughter Behavioral consideration : confusion at times, anoxic BI? Chemo/radiation : no   Previous Home Environment (from acute therapy documentation) Living Arrangements: Spouse/significant other, Children Available Help at Discharge: Family Type of Home: House Home Layout: Two level Alternate Level Stairs-Rails: Right Alternate Level Stairs-Number of Steps: 10 Home Access: Stairs to enter Entrance Stairs-Number of Steps: [facility]: No  Discharge Living Setting Plans for Discharge Living Setting: Patient's home, Lives with (comment)(wife) Type of Home at Discharge: House Discharge Home Layout: Two level, 1/2 bath on main level, Able to live on main level with bedroom/bathroom Alternate Level  Stairs-Rails: Left Alternate Level Stairs-Number of Steps: 12 Discharge Home Access: Level entry Discharge Bathroom Shower/Tub: Tub/shower unit(full bath upstairs only) Discharge Bathroom Toilet: Standard Discharge Bathroom Accessibility: Yes How Accessible: Accessible via walker Does the patient have any problems obtaining your medications?: No  Social/Family/Support Systems Patient Roles: Spouse, Other (Comment)(full time employee) Contact Information: wife: [name]: [phone]; daugther (is main contact as she speaks English: Aida [phone] Anticipated Caregiver: wife (son and daughter live nearby ) Anticipated Caregiver's Contact Information: see above Ability/Limitations of Caregiver: Min/Mod A Caregiver Availability: 24/7 Discharge Plan Discussed with Primary Caregiver: Yes Is Caregiver In Agreement with Plan?: Yes Does Caregiver/Family have Issues with Lodging/Transportation while Pt is in Rehab?: No   Wife is designated visitor: Magazine features editor  Goals/Additional Needs Patient/Family Goal for Rehab: PT/OT: Min A; SLP: Supervision Expected length of stay: 20-24 days Cultural Considerations: speaks [place] (understands some English) but prefers interpreter (use stratus).  Dietary Needs: DYS 2, thin liquids, room service with assist Equipment Needs: TBD Special Service Needs: Interpreter needs: [place]ian  Pt/Family Agrees to Admission and willing to participate: Yes Program Orientation Provided & Reviewed with Pt/Caregiver Including Roles  & Responsibilities: Yes(wife and pt)  Barriers to Discharge: Home environment access/layout, Insurance for SNF coverage, Nutrition means, New oxygen  Barriers to Discharge Comments: bedroom/full bath on 2nd level (can stay on first floor if needed); new O2 needs, Intrepreter needs  Decrease burden of Care through IP rehab admission: NA  Possible need for SNF placement upon discharge: Not anticipated; pt has stated she is able and willing to  provide 24/7 A at DC and appears to be very supportive of his rehab efforts. House is level entry for easy  accessibility.   Patient Condition: I have reviewed medical records from [facility], spoken with RN, MD, and NP, and patient and spouse. I met with patient at the bedside for inpatient rehabilitation assessment.  Patient will benefit from ongoing PT, OT and SLP, can actively participate in 3 hours of therapy a day 5 days of the week, and can make measurable gains during the admission.  Patient will also benefit from the coordinated team approach during an Inpatient Acute Rehabilitation admission.  The patient will receive intensive therapy as well as Rehabilitation physician, nursing, social worker, and care management interventions.  Due to bladder management, bowel management, safety, skin/wound care, disease management, medication administration, pain management and patient education the patient requires 24 hour a day rehabilitation nursing.  The patient is currently Mod/Max A x2 for transfers with Charlaine Dalton and Max to Total Ax2 for basic ADLs.  Discharge setting and therapy post discharge at home with home health is anticipated.  Patient has agreed to participate in the Acute Inpatient Rehabilitation Program and will admit 05/04/2019.  Preadmission Screen Completed By:  Jhonnie Garner, 05/04/2019 4:49 PM ______________________________________________________________________   Discussed status with Dr. Naaman Plummer on 05/04/2019 at 4:45PM and received approval for admission today.  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 Does the need for close, 24 hr/day Medical supervision in concert with the patient's rehab needs make it unreasonable for this patient to be served in a less intensive setting? Yes 2. Co-Morbidities requiring supervision/potential complications: right caudate infarct,  AKI, delirium 3. Due to bladder management, bowel management, safety, skin/wound care, disease management, medication administration, pain management and patient education, does the patient require 24 hr/day rehab nursing? Yes 4. Does the patient require coordinated care of a physician, rehab nurse, PT (1-2 hrs/day, 5 days/week), OT (1-2 hrs/day, 5 days/week) and SLP (1-2 hrs/day, 5 days/week) to address physical and functional deficits in the context of the above medical diagnosis(es)? Yes Addressing deficits in the following areas: balance, endurance, locomotion, strength, transferring, bowel/bladder control, bathing, dressing, feeding, grooming, toileting, cognition, speech, swallowing and psychosocial support 5. Can the patient actively participate in an intensive therapy program of at least 3 hrs of therapy 5 days a week? Yes 6. The potential for patient to make measurable gains while on inpatient rehab is good 7. Anticipated functional outcomes upon discharge from inpatients are: min assist PT, min assist OT, supervision SLP 8. Estimated rehab length of stay to reach the above functional goals is: 20-24 days 9. Anticipated D/C setting: Home 10. Anticipated post D/C treatments: [place] therapy 11. Overall Rehab/Functional Prognosis: good  MD Signature: Meredith Staggers, MD, [facility] 05/04/2019

## 2019-05-03 NOTE — Progress Notes (Signed)
Nutrition Follow-up  DOCUMENTATION CODES:   Not applicable  INTERVENTION:   - Ensure Enlive po BID, each supplement provides 350 kcal and 20 grams of protein  NUTRITION DIAGNOSIS:   Increased nutrient needs related to post-op healing as evidenced by estimated needs.  Ongoing  GOAL:   Patient will meet greater than or equal to 90% of their needs  Progressing  MONITOR:   PO intake, Supplement acceptance, Diet advancement, Labs, Weight trends, Skin, I & O's  REASON FOR ASSESSMENT:   Ventilator    ASSESSMENT:   Patient with unknown PMH. Presents this admission with cardiac arrest due to STEMI.  8/24 - L groin impella placed, PCI unsuccessful 8/28 - impella removed  9/03 - CRRT initiated 9/08 - CRRT stopped, extubated  Per Nephrology, no strong indication for intermittent HD today. Pt is s/p MBS this AM with diet advanced to Dysphagia 2 and thin liquids.  Weight down a total of 11 lbs since admit.  Noted plan for hopeful d/c to CIR.  Unable to speak with pt at time of visit. Pt in room meeting with family and other providers.  RD will order an oral nutrition supplement to aid pt in meeting kcal and protein needs.  Medications reviewed and include: B-complex with vitamin C, Pepcid, SSI, Levemir 5 units q 12 hours, Prednisone,   Labs reviewed: BUN 111, creatinine 3.20, phosphorus 7.9 CBG's: 117-188 x 24 hours  UOP: 1250 ml x 24 hours I/O's: -6.5 L since admit  Diet Order:   Diet Order            DIET DYS 2 Room service appropriate? Yes with Assist; Fluid consistency: Thin  Diet effective now              EDUCATION NEEDS:   Not appropriate for education at this time  Skin:  Skin Assessment: Skin Integrity Issues: Skin Integrity Issues: Unstageable: left head  Last BM:  05/01/19  Height:   Ht Readings from Last 1 Encounters:  04/16/19 5\' 6"  (1.676 m)    Weight:   Wt Readings from Last 1 Encounters:  05/03/19 83.1 kg    Ideal Body Weight:   64.5 kg  BMI:  Body mass index is 29.57 kg/m.  Estimated Nutritional Needs:   Kcal:  2100-2300  Protein:  105-120 grams  Fluid:  >/= 2.0 L    Gaynell Face, MS, RD, LDN Inpatient Clinical Dietitian Pager: [phone] Weekend/After Hours: [phone]

## 2019-05-03 NOTE — Progress Notes (Signed)
Inpatient Rehab Admissions:  Inpatient Rehab Consult received.  I met with pt and his wife at the bedside for rehabilitation assessment and to discuss goals and expectations of an inpatient rehab admission. I used [employer] [place] interpreter Tatjana [phone]. Pt appears to be a great candidate for CIR and his wife has confirmed 24/7 DC support. AC discussed that his insurance would have to approve an admission and that I will continue to follow his progress while his medical workup is underway.   Pt and wife hopeful for admission once medically ready.  Jhonnie Garner, OTR/L  Rehab Admissions Coordinator  [phone] 05/03/2019 11:33 AM

## 2019-05-03 NOTE — Progress Notes (Signed)
Modified Barium Swallow Progress Note  Patient Details  Name: Lawrence Olson [name] [id number] Date of Birth: [birth year]  Today's Date: 05/03/2019  Modified Barium Swallow completed.  Full report located under Chart Review in the Imaging Section.  Brief recommendations include the following:  Clinical Impression  Lawrence Olson is a 68 year old male who presents with mild oropharyngeal dysphagia with resultant shallow, transient laryngeal penetration of thin liquid and moderate-severe vallecular residue with solids on today's exam.   Pt consumed trials of thin liquid, puree, regular solids, and a barium pill whole in puree.  Pt exhibited prolonged mastication of regular solids and reduced lingual strength, resulting in trace-mild oral residue with puree and solid trials.  Pharyngeal phase was remarkable for reduced pharyngeal constriction, resulting in moderate-severe vallecular residue with solids.  Pt additionally presented with reduced anterior movement of the hyolaryngeal segment and mildly reduced BOT retraction, which may also have contributed to the vallecular residue.  During barium pill trial, pt was unable to propel the bolus posteriorly, and he subsequently expelled it from his oral cavity.  Recommend Dysphagia 2 (fine chop) solids and thin liquid with use of aspiration precautions and medications administered crushed or cut in half in puree.  Suspect that pt will be able to tolerate increasingly complex solids given time and improved strength.     Swallow Evaluation Recommendations       SLP Diet Recommendations: Dysphagia 2 (Fine chop) solids;Thin liquid   Liquid Administration via: Straw;Cup   Medication Administration: Other (Comment)(Crushed or cut in 1/2 in puree)   Supervision: Staff to assist with self feeding   Compensations: Slow rate;Small sips/bites;Follow solids with liquid   Postural Changes: Seated upright at 90 degrees   Oral Care Recommendations: Oral care BID     Bretta Bang, M.S., [place] Office: [phone]     Elvia Collum Minnah Llamas 05/03/2019,1:28 PM

## 2019-05-03 NOTE — Progress Notes (Signed)
Admit: 04/16/2019 LOS: 74  76M s/p STEMI-->Cardiac Arrest (unable to PCI prox LAD), ischemic ATN req CRRT, systolc CHF/cardiogenic shock, VDRF  Subjective:  . 1.2 L UOP yesterday . Creatinine 3.0-3.2, BUN 111, potassium 4.3. . Continues on prednisone . Complains of thirst . Spoke with patient via interpreter service and  09/09 0701 - 09/10 0700 In: -  Out: 1250 [Urine:1250]  Filed Weights   05/01/19 0455 05/02/19 0500 05/03/19 0452  Weight: 87.4 kg 84.7 kg 83.1 kg    Scheduled Meds: .  stroke: mapping our early stages of recovery book   Does not apply Once  . aspirin  81 mg Oral Daily  . atorvastatin  80 mg Oral q1800  . B-complex with vitamin C  1 tablet Oral Daily  . carvedilol  3.125 mg Oral BID WC  . chlorhexidine  15 mL Mouth Rinse BID  . Chlorhexidine Gluconate Cloth  6 each Topical Daily  . famotidine  20 mg Oral Daily  . heparin injection (subcutaneous)  5,000 Units Subcutaneous Q8H  . insulin aspart  3-9 Units Subcutaneous Q4H  . insulin detemir  5 Units Subcutaneous Q12H  . mouth rinse  15 mL Mouth Rinse q12n4p  . predniSONE  40 mg Oral Q breakfast  . QUEtiapine  100 mg Oral BID  . sodium chloride flush  10 mL Intravenous Q12H  . sodium chloride flush  10-40 mL Intracatheter Q12H  . tamsulosin  0.4 mg Oral QPC supper   Continuous Infusions: . sodium chloride Stopped (05/02/19 0033)   PRN Meds:.sodium chloride, heparin, hydrALAZINE, influenza vaccine adjuvanted, levalbuterol, ondansetron (ZOFRAN) IV, sodium chloride flush, sodium chloride flush  Current Labs: reviewed   Physical Exam:  Blood pressure 130/76, pulse 84, temperature 97.7 F (36.5 C), temperature source Oral, resp. rate 10, height 5\' 6"  (1.676 m), weight 83.1 kg, SpO2 99 %. Awake, alert RRR, normal S1 and S2, no rub Coarse breath sounds bilaterally, normal work of breathing Trace to 1+ edema Soft, nontender Nonfocal, CN II through XII grossly intact  A 1. Nonoliguric dialysis dependent  ischemic ATN, requiring CRRT 9/3 to 9/8:  Appears to be recovering GFR.  No strong indication for intermittent HD today.  Azotemia reflects low GFR and catabolic effect of steroids.  Follow labs and urine output for the next 24 hours.  Still could require further support with HD,.  He had a fairly normal creatinine at the time of presentation,  2. Status post cardiac arrest, STEMI with lesion not amenable to PCI, ischemic cardiomyopathy with systolic heart failure and shock.  As above.  Advanced heart failure following.  Much improved. 3. 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 Anemia, hemoglobin stable  P . As above, follow labs and UOP for the next 24 hours . Medication Issues; o Preferred narcotic agents for pain control are hydromorphone, fentanyl, and methadone. Morphine should not be used.  o Baclofen should be avoided o Avoid oral sodium phosphate and magnesium citrate based laxatives / bowel preps    Pearson Grippe MD 05/03/2019, 9:00 AM  Recent Labs  Lab 05/01/19 0320 05/01/19 0611 05/02/19 0421 05/03/19 0440  NA 134* 138 137 143  K 4.1 4.3 3.9 4.3  CL 101  --  102 108  CO2 25  --  24 23  GLUCOSE 172*  --  157* 149*  BUN 49*  --  87* 111*  CREATININE 1.82*  --  3.02* 3.20*  CALCIUM 7.6*  --  8.1* 8.3*  PHOS 4.7*  --  6.4* 7.9*   Recent Labs  Lab 04/30/19 0258 05/01/19 0320 05/01/19 0611 05/02/19 0421  WBC 27.4* 34.5*  --  33.0*  HGB 8.5* 8.1* 9.2* 8.4*  HCT 27.7* 25.9* 27.0* 26.8*  MCV 98.9 99.6  --  98.5  PLT 667* 699*  --  892*

## 2019-05-03 NOTE — Progress Notes (Signed)
Physical Therapy Treatment Patient Details Name: Lawrence Olson [name] [id number] DOB: 29-Apr-1951 Today's Date: 05/03/2019    History of Present Illness 68yo M out of hospital cardiac arrest with ROSC, intubated, found to have STEMI, taken emergently to cath lab. Downtime ~20 minutes with anoxic brain injury and significant encephalopathy.  Demonstrating posturing initially, then responding some to family when speaking [place].  Pt was on CVVHD as well and PT had to d/c pt for medical reasons on 9/4.  Reconsult placed and 2nd evaluations by PT/OT  completed 9/9    PT Comments    Pt admitted with above diagnosis. Pt was able to participate with therapy and stood a little better the first attempt at standing today in [place].  Fatigues quickly and needs a lot of assist to sit up due to right lateral lean with pt needing total assist at times to sit up.  Will continue to progress pt as able.   Pt currently with functional limitations due to the deficits listed below (see PT Problem List). Pt will benefit from skilled PT to increase their independence and safety with mobility to allow discharge to the venue listed below.     Follow Up Recommendations  CIR;Supervision/Assistance - 24 hour     Equipment Recommendations  Other (comment)(TBA)    Recommendations for Other Services Rehab consult     Precautions / Restrictions Precautions Precautions: Fall Restrictions Weight Bearing Restrictions: No    Mobility  Bed Mobility Overal bed mobility: Needs Assistance Bed Mobility: Rolling;Sidelying to Sit Rolling: Mod assist;+2 for physical assistance Sidelying to sit: Max assist;+2 for physical assistance       General bed mobility comments: Pt assisted needing assist for trunk elevation.  Pt needed assist to move hips to EOB as well. Does not use his UEs as he should to assist with mobility.   Transfers Overall transfer level: Needs assistance   Transfers: Sit to/from Stand;Stand Pivot  Transfers Sit to Stand: +2 physical assistance;Mod assist;Max assist Stand pivot transfers: Total assist;+2 physical assistance       General transfer comment: Pt needed mod assist for initial stand to [facility] with pt not standing all the way up.  Does not use UEs to pull up on STedy nor does he stand upright fully.  Pt also with right lateral lean significant at times needing total assist to sit up.   Moved pt to recliner with pt sitting on STedy needing  total assist as he leans so heavy to right and anterior and even with interpreter would not sit up.  Last stand was max assist due to pt fatigue with right lateral lean.  Pt told interpreter "I can't get my body to do what I want. Will that iimprove?"  Ambulation/Gait             General Gait Details: pt unable    Stairs             Wheelchair Mobility    Modified Rankin (Stroke Patients Only)       Balance Overall balance assessment: Needs assistance Sitting-balance support: Bilateral upper extremity supported;Feet supported Sitting balance-Leahy Scale: Zero Sitting balance - Comments: max to total assist due to pt pushing with left UE or not even attempting to use UEs.  He did have a brief time sitting EOB when he did get balance for a few seconds after PT working on propping on left elbow and sitting back to midline.   Postural control: Right lateral lean;Posterior lean Standing balance support:  Bilateral upper extremity supported;During functional activity Standing balance-Leahy Scale: Zero Standing balance comment: cannot stand fully upright even with total assist of 2.                             Cognition Arousal/Alertness: Awake/alert Behavior During Therapy: Restless Overall Cognitive Status: Impaired/Different from baseline Area of Impairment: Orientation;Memory;Following commands;Safety/judgement;Awareness;Problem solving;Attention                 Orientation Level: Disoriented  to;Time(did not ask situation) Current Attention Level: Focused Memory: Decreased short-term memory;Decreased recall of precautions Following Commands: Follows one step commands with increased time Safety/Judgement: Decreased awareness of safety;Decreased awareness of deficits Awareness: Emergent Problem Solving: Difficulty sequencing;Requires verbal cues;Requires tactile cues;Decreased initiation;Slow processing General Comments: pt able to follow commands given increased time, slow procesing and poor sequencing; translator Tatjana # [id number] used.       Exercises General Exercises - Lower Extremity Ankle Circles/Pumps: AROM;Both;10 reps;Supine Long Arc Quad: AROM;Both;10 reps;Seated Hip Flexion/Marching: AROM;Both;10 reps;Seated    General Comments General comments (skin integrity, edema, etc.): VSS.  Pt had lunch in room therefore assisted pt and wife in techniques for letting pt asist with feeding.  Will talk with OT about getting pt built up spoons.        Pertinent Vitals/Pain Pain Assessment: Faces Faces Pain Scale: No hurt    Home Living                      Prior Function            PT Goals (current goals can now be found in the care plan section) Acute Rehab PT Goals Patient Stated Goal: to recover fully Progress towards PT goals: Progressing toward goals    Frequency    Min 3X/week      PT Plan Current plan remains appropriate    Co-evaluation              AM-PAC PT "6 Clicks" Mobility   Outcome Measure  Help needed turning from your back to your side while in a flat bed without using bedrails?: A Lot Help needed moving from lying on your back to sitting on the side of a flat bed without using bedrails?: Total Help needed moving to and from a bed to a chair (including a wheelchair)?: Total Help needed standing up from a chair using your arms (e.g., wheelchair or bedside chair)?: Total Help needed to walk in hospital room?: Total Help  needed climbing 3-5 steps with a railing? : Total 6 Click Score: 7    End of Session Equipment Utilized During Treatment: Gait belt;Oxygen Activity Tolerance: Patient limited by fatigue Patient left: with call bell/phone within reach;in chair;with chair alarm set;with nursing/sitter in room;with family/visitor present Nurse Communication: Mobility status;Need for lift equipment(nursing helped PT get pt positioned better in chair) PT Visit Diagnosis: Other abnormalities of gait and mobility (R26.89);Other symptoms and signs involving the nervous system (R29.898)     Time: 1129-1208 PT Time Calculation (min) (ACUTE ONLY): 39 min  Charges:  $Therapeutic Exercise: 8-22 mins $Therapeutic Activity: 23-37 mins                     [place] Pager:  [phone]  Office:  [place] 05/03/2019, 1:52 PM

## 2019-05-03 NOTE — Progress Notes (Signed)
Patient ID: Lawrence Olson, male   DOB: Sep 26, 1950, 68 y.o.   [name] [id number]   Advanced Heart Failure Team  Note   Primary Physician: Patient, No Pcp Per PCP-Cardiologist:  No primary care provider on file.  HPI:    Extubated 9/8  Lying in bed. Says he's hungry and thirsty.  Failed swallow study yesterday. Pending possible MBS today.   Denies CP or sob. Oriented x 2  CVP 6   Co-ox 72%  Creatinine up to 3.0 -> 3.2 BUN 111  Made 1.2L urine  Echo 04/24/19 EF 25-30% no LV thrombus   Objective:    Vital Signs:   Temp:  [97.7 F (36.5 C)-99 F (37.2 C)] 97.7 F (36.5 C) (09/10 0700) Pulse Rate:  [71-98] 84 (09/10 0700) Resp:  [7-18] 10 (09/10 0700) BP: (106-137)/(63-77) 130/76 (09/10 0700) SpO2:  [93 %-99 %] 99 % (09/10 0700) Weight:  [83.1 kg] 83.1 kg (09/10 0452) Last BM Date: 05/01/19  Weight change: Filed Weights   05/01/19 0455 05/02/19 0500 05/03/19 0452  Weight: 87.4 kg 84.7 kg 83.1 kg    Intake/Output:   Intake/Output Summary (Last 24 hours) at 05/03/2019 0827 Last data filed at 05/03/2019 0655 Gross per 24 hour  Intake -  Output 1250 ml  Net -1250 ml      Physical Exam   General:  Lying flat in bed . No resp difficulty HEENT: normal Neck: supple. RIJ trialysis LIJ TLC. Carotids 2+ bilat; no bruits. No lymphadenopathy or thryomegaly appreciated. Cor: PMI nondisplaced. Regular rate & rhythm. No rubs, gallops or murmurs. Lungs: clear Abdomen: soft, nontender, nondistended. No hepatosplenomegaly. No bruits or masses. Good bowel sounds. Extremities: no cyanosis, clubbing, rash, edema Neuro: alert & orientedx2, cranial nerves grossly intact. moves all 4 extremities w/o difficulty. Affect pleasant   Telemetry   NSR 80-90s Personally reviewed  Labs   Basic Metabolic Panel: Recent Labs  Lab 04/27/19 0240  04/28/19 0319  04/29/19 0327  04/30/19 0258 04/30/19 1600 05/01/19 0320 05/01/19 0611 05/02/19 0421 05/03/19 0440  NA 145   < > 139   < > 135   <  > 136 136 134* 138 137 143  K 3.9   < > 4.0   < > 4.6   < > 5.7* 4.4 4.1 4.3 3.9 4.3  CL 110   < > 104   < > 101   < > 101 102 101  --  102 108  CO2 26   < > 26   < > 24   < > '24 25 25  ' --  24 23  GLUCOSE 195*   < > 163*   < > 163*   < > 284* 133* 172*  --  157* 149*  BUN 88*   < > 51*   < > 44*   < > 48* 48* 49*  --  87* 111*  CREATININE 2.80*   < > 1.86*   < > 1.90*   < > 2.00* 1.83* 1.82*  --  3.02* 3.20*  CALCIUM 7.6*   < > 7.8*   < > 8.0*   < > 7.9* 8.0* 7.6*  --  8.1* 8.3*  MG 2.7*  --  2.8*  --  2.8*  --  3.0*  --  2.9*  --   --   --   PHOS 3.7   < > 4.1   < > 4.5   < > 5.8* 4.6 4.7*  --  6.4* 7.9*   < > =  values in this interval not displayed.    Liver Function Tests: Recent Labs  Lab 04/30/19 0258 04/30/19 1600 05/01/19 0320 05/02/19 0421 05/03/19 0440  ALBUMIN 1.9* 2.2* 1.9* 2.2* 2.2*   No results for input(s): LIPASE, AMYLASE in the last 168 hours. No results for input(s): AMMONIA in the last 168 hours.  CBC: Recent Labs  Lab 04/28/19 0319  04/29/19 0327 04/29/19 9150 04/30/19 0258 05/01/19 0320 05/01/19 0611 05/02/19 0421  WBC 27.2*  --  30.7*  --  27.4* 34.5*  --  33.0*  HGB 8.3*   < > 8.8* 9.9* 8.5* 8.1* 9.2* 8.4*  HCT 27.1*   < > 28.5* 29.0* 27.7* 25.9* 27.0* 26.8*  MCV 98.9  --  99.3  --  98.9 99.6  --  98.5  PLT 551*  --  630*  --  667* 699*  --  892*   < > = values in this interval not displayed.    Cardiac Enzymes: No results for input(s): CKTOTAL, CKMB, CKMBINDEX, TROPONINI in the last 168 hours.  BNP: BNP (last 3 results) No results for input(s): BNP in the last 8760 hours.  ProBNP (last 3 results) No results for input(s): PROBNP in the last 8760 hours.   CBG: Recent Labs  Lab 05/02/19 1108 05/02/19 1543 05/02/19 1945 05/02/19 2314 05/03/19 0335  GLUCAP 187* 188* 150* 156* 127*    Coagulation Studies: No results for input(s): LABPROT, INR in the last 72 hours.   Imaging   No results found.   Medications:     Current  Medications: .  stroke: mapping our early stages of recovery book   Does not apply Once  . aspirin  81 mg Oral Daily  . atorvastatin  80 mg Oral q1800  . B-complex with vitamin C  1 tablet Oral Daily  . chlorhexidine  15 mL Mouth Rinse BID  . Chlorhexidine Gluconate Cloth  6 each Topical Daily  . famotidine  20 mg Oral Daily  . heparin injection (subcutaneous)  5,000 Units Subcutaneous Q8H  . insulin aspart  3-9 Units Subcutaneous Q4H  . insulin detemir  5 Units Subcutaneous Q12H  . mouth rinse  15 mL Mouth Rinse q12n4p  . predniSONE  40 mg Oral Q breakfast  . QUEtiapine  100 mg Oral BID  . sodium chloride flush  10 mL Intravenous Q12H  . sodium chloride flush  10-40 mL Intracatheter Q12H  . tamsulosin  0.4 mg Oral QPC supper    Infusions: . sodium chloride Stopped (05/02/19 0033)    Assessment/Plan   1. CAD: Anterolateral STEMI, cath 8/24 showed occluded proximal LAD, unable to open.  Once we see how he recovers neurologically, will need to decide on feasibility of re-attempt PCI versus CABG. Medical therapy for the forseeable future. - No s/s schemia - Continue medical therapy with ASA + statin. Will add low-dose carvedilol   2. Cardiogenic shock: Ischemic cardiomyopathy.   - Echo with EF 25%, normal RV.  Impella removed 8/28.  Milrinone stopped 9/6.  - Co-ox 71  - No ACE/ARB?ARNI or b-blocker yet with AKI and shock - Volume status ok. Continue to follow off CVVHD. May need 1 session of iHD as kidneys recover. W wil see.  - Start low-dose carvedilol  3. Acute hypoxic respiratory failure - Extubated 9/8. .  - Suspect combination of HF and possible aspiration  - Steroids started 9/6 for possible pneumonitis  (ESR 128/persistent diffuse lung infiltrates despite 20 pound volume removal). Amio stopped several days  ago. - Stable today  - Would wean steroids over a week as I do feel he had a component of ARDS/pneumonitis. D/w Dr. Lamonte Sakai   4. AKI:  -Suspect hemodynamic (ATN)   -  CVVHD started 9/3 - Volume status ok. Creatinine climbing but starting to level out - Continue to follow off CVVHD. Made 1.1=2 L of urine - Hopefully renal function will continue to improve. May need some iHD in the interim. Renal following   5. ID - off abx. No fevers. cx negative - check CBC in am  5. Cardiac arrest/VF: 20 minutes downtime, concerned for anoxic brain injury.  Neurology following, per yesterday's note he seems to have intact brainstem function but minimal cortical function. EEG suggestive of diffuse encephalopathy. CT head without definite evidence for anoxic encephalopathy.  MRI without convincing evidence of anoxic injury - Mental status much improved. Stil lwith mild anoxic encepholpathy but seems to be improving rapidly.  - Neuro input appreciated - Continue PT/OT/Speech (thanks!) - Failed swallow study. Possible MBS today - CIR consulted  6. DVT prophylaxis: - enox  7. Urinary retention - Foley. Wean as tolerated. Start Flomax   Transfer to SDU   Glori Bickers MD 05/03/2019 8:27 AM

## 2019-05-03 NOTE — Progress Notes (Signed)
Patient ID: Lawrence Olson, male   DOB: [birth year], 68 y.o.   [name] [id number]   Advanced Heart Failure Team  Note   Primary Physician: Patient, No Pcp Per PCP-Cardiologist:  No primary care provider on file.  HPI:    Extubated 9/8  Lying in bed. Says he's hungry and thirsty.  Failed swallow study yesterday. Pending possible MBS today.   Denies CP or sob. Oriented x 2  CVP 6   Co-ox 72%  Creatinine up to 3.0 -> 3.2 BUN 111  Made 1.2L urine  Echo 04/24/19 EF 25-30% no LV thrombus   Objective:    Vital Signs:   Temp:  [97.7 F (36.5 C)-99 F (37.2 C)] 97.7 F (36.5 C) (09/10 0700) Pulse Rate:  [71-98] 84 (09/10 0700) Resp:  [7-18] 10 (09/10 0700) BP: (106-137)/(63-77) 130/76 (09/10 0700) SpO2:  [93 %-99 %] 99 % (09/10 0700) Weight:  [83.1 kg] 83.1 kg (09/10 0452) Last BM Date: 05/01/19  Weight change: Filed Weights   05/01/19 0455 05/02/19 0500 05/03/19 0452  Weight: 87.4 kg 84.7 kg 83.1 kg    Intake/Output:   Intake/Output Summary (Last 24 hours) at 05/03/2019 0834 Last data filed at 05/03/2019 0655 Gross per 24 hour  Intake -  Output 1250 ml  Net -1250 ml      Physical Exam   General:  Lying flat in bed . No resp difficulty HEENT: normal Neck: supple. RIJ trialysis LIJ TLC. Carotids 2+ bilat; no bruits. No lymphadenopathy or thryomegaly appreciated. Cor: PMI nondisplaced. Regular rate & rhythm. No rubs, gallops or murmurs. Lungs: clear Abdomen: soft, nontender, nondistended. No hepatosplenomegaly. No bruits or masses. Good bowel sounds. Extremities: no cyanosis, clubbing, rash, edema Neuro: alert & orientedx2, cranial nerves grossly intact. moves all 4 extremities w/o difficulty. Affect pleasant   Telemetry   NSR 80-90s Personally reviewed  Labs   Basic Metabolic Panel: Recent Labs  Lab 04/27/19 0240  04/28/19 0319  04/29/19 0327  04/30/19 0258 04/30/19 1600 05/01/19 0320 05/01/19 0611 05/02/19 0421 05/03/19 0440  NA 145   < > 139   < > 135   <  > 136 136 134* 138 137 143  K 3.9   < > 4.0   < > 4.6   < > 5.7* 4.4 4.1 4.3 3.9 4.3  CL 110   < > 104   < > 101   < > 101 102 101  --  102 108  CO2 26   < > 26   < > 24   < > '24 25 25  ' --  24 23  GLUCOSE 195*   < > 163*   < > 163*   < > 284* 133* 172*  --  157* 149*  BUN 88*   < > 51*   < > 44*   < > 48* 48* 49*  --  87* 111*  CREATININE 2.80*   < > 1.86*   < > 1.90*   < > 2.00* 1.83* 1.82*  --  3.02* 3.20*  CALCIUM 7.6*   < > 7.8*   < > 8.0*   < > 7.9* 8.0* 7.6*  --  8.1* 8.3*  MG 2.7*  --  2.8*  --  2.8*  --  3.0*  --  2.9*  --   --   --   PHOS 3.7   < > 4.1   < > 4.5   < > 5.8* 4.6 4.7*  --  6.4* 7.9*   < > =  values in this interval not displayed.    Liver Function Tests: Recent Labs  Lab 04/30/19 0258 04/30/19 1600 05/01/19 0320 05/02/19 0421 05/03/19 0440  ALBUMIN 1.9* 2.2* 1.9* 2.2* 2.2*   No results for input(s): LIPASE, AMYLASE in the last 168 hours. No results for input(s): AMMONIA in the last 168 hours.  CBC: Recent Labs  Lab 04/28/19 0319  04/29/19 0327 04/29/19 9532 04/30/19 0258 05/01/19 0320 05/01/19 0611 05/02/19 0421  WBC 27.2*  --  30.7*  --  27.4* 34.5*  --  33.0*  HGB 8.3*   < > 8.8* 9.9* 8.5* 8.1* 9.2* 8.4*  HCT 27.1*   < > 28.5* 29.0* 27.7* 25.9* 27.0* 26.8*  MCV 98.9  --  99.3  --  98.9 99.6  --  98.5  PLT 551*  --  630*  --  667* 699*  --  892*   < > = values in this interval not displayed.    Cardiac Enzymes: No results for input(s): CKTOTAL, CKMB, CKMBINDEX, TROPONINI in the last 168 hours.  BNP: BNP (last 3 results) No results for input(s): BNP in the last 8760 hours.  ProBNP (last 3 results) No results for input(s): PROBNP in the last 8760 hours.   CBG: Recent Labs  Lab 05/02/19 1108 05/02/19 1543 05/02/19 1945 05/02/19 2314 05/03/19 0335  GLUCAP 187* 188* 150* 156* 127*    Coagulation Studies: No results for input(s): LABPROT, INR in the last 72 hours.   Imaging   No results found.   Medications:     Current  Medications: .  stroke: mapping our early stages of recovery book   Does not apply Once  . aspirin  81 mg Oral Daily  . atorvastatin  80 mg Oral q1800  . B-complex with vitamin C  1 tablet Oral Daily  . carvedilol  3.125 mg Oral BID WC  . chlorhexidine  15 mL Mouth Rinse BID  . Chlorhexidine Gluconate Cloth  6 each Topical Daily  . famotidine  20 mg Oral Daily  . heparin injection (subcutaneous)  5,000 Units Subcutaneous Q8H  . insulin aspart  3-9 Units Subcutaneous Q4H  . insulin detemir  5 Units Subcutaneous Q12H  . mouth rinse  15 mL Mouth Rinse q12n4p  . predniSONE  40 mg Oral Q breakfast  . QUEtiapine  100 mg Oral BID  . sodium chloride flush  10 mL Intravenous Q12H  . sodium chloride flush  10-40 mL Intracatheter Q12H  . tamsulosin  0.4 mg Oral QPC supper    Infusions: . sodium chloride Stopped (05/02/19 0033)    Assessment/Plan   1. CAD: Anterolateral STEMI, cath 8/24 showed occluded proximal LAD, unable to open.  Once we see how he recovers neurologically, will need to decide on feasibility of re-attempt PCI versus CABG. Medical therapy for the forseeable future. - No s/s schemia - Continue medical therapy with ASA + statin. Will add low-dose carvedilol   2. Cardiogenic shock: Ischemic cardiomyopathy.   - Echo with EF 25%, normal RV.  Impella removed 8/28.  Milrinone stopped 9/6.  - Co-ox 71  - No ACE/ARB?ARNI or b-blocker yet with AKI and shock - Volume status ok. Continue to follow off CVVHD. May need 1 session of iHD as kidneys recover. W wil see.  - Start low-dose carvedilol  3. Acute hypoxic respiratory failure - Extubated 9/8. .  - Suspect combination of HF and possible aspiration  - Steroids started 9/6 for possible pneumonitis  (ESR 128/persistent diffuse lung infiltrates  despite 20 pound volume removal). Amio stopped several days ago. - Stable today  - Would wean steroids over a week as I do feel he had a component of ARDS/pneumonitis. D/w Dr. Lamonte Sakai   4.  AKI:  -Suspect hemodynamic (ATN)   - CVVHD started 9/3 - Volume status ok. Creatinine climbing but starting to level out - Continue to follow off CVVHD. Made 1.1=2 L of urine - Hopefully renal function will continue to improve. May need some iHD in the interim. Renal following   5. ID - off abx. No fevers. cx negative - check CBC in am  5. Cardiac arrest/VF: 20 minutes downtime, concerned for anoxic brain injury.  Neurology following, per yesterday's note he seems to have intact brainstem function but minimal cortical function. EEG suggestive of diffuse encephalopathy. CT head without definite evidence for anoxic encephalopathy.  MRI without convincing evidence of anoxic injury - Mental status much improved. Stil lwith mild anoxic encepholpathy but seems to be improving rapidly.  - Neuro input appreciated - Continue PT/OT/Speech (thanks!) - Failed swallow study. Possible MBS today - CIR consulted - Consider LifeVest on d/c. Will d/w EP   6. DVT prophylaxis: - enox  7. Urinary retention - Foley. Wean as tolerated. Start Flomax   Transfer to SDU   Glori Bickers MD 05/03/2019 8:34 AM

## 2019-05-04 ENCOUNTER — Inpatient Hospital Stay (HOSPITAL_COMMUNITY)
Admission: RE | Admit: 2019-05-04 | Discharge: 2019-05-16 | DRG: 945 | Disposition: A | Discharge status: Home Health | Payer: PPO | Source: Intra-hospital | Attending: Physical Medicine & Rehabilitation | Admitting: Physical Medicine & Rehabilitation

## 2019-05-04 ENCOUNTER — Encounter (HOSPITAL_COMMUNITY): Payer: Self-pay | Admitting: Physical Medicine and Rehabilitation

## 2019-05-04 DIAGNOSIS — K5900 Constipation, unspecified: Secondary | ICD-10-CM | POA: Diagnosis present

## 2019-05-04 DIAGNOSIS — R1310 Dysphagia, unspecified: Secondary | ICD-10-CM

## 2019-05-04 DIAGNOSIS — I2510 Atherosclerotic heart disease of native coronary artery without angina pectoris: Secondary | ICD-10-CM

## 2019-05-04 DIAGNOSIS — R5381 Other malaise: Secondary | ICD-10-CM | POA: Diagnosis not present

## 2019-05-04 DIAGNOSIS — Z87891 Personal history of nicotine dependence: Secondary | ICD-10-CM

## 2019-05-04 DIAGNOSIS — D62 Acute posthemorrhagic anemia: Secondary | ICD-10-CM | POA: Diagnosis not present

## 2019-05-04 DIAGNOSIS — Z992 Dependence on renal dialysis: Secondary | ICD-10-CM | POA: Diagnosis not present

## 2019-05-04 DIAGNOSIS — R4189 Other symptoms and signs involving cognitive functions and awareness: Secondary | ICD-10-CM | POA: Diagnosis not present

## 2019-05-04 DIAGNOSIS — J189 Pneumonia, unspecified organism: Secondary | ICD-10-CM | POA: Diagnosis present

## 2019-05-04 DIAGNOSIS — E871 Hypo-osmolality and hyponatremia: Secondary | ICD-10-CM | POA: Diagnosis not present

## 2019-05-04 DIAGNOSIS — Z79899 Other long term (current) drug therapy: Secondary | ICD-10-CM | POA: Diagnosis not present

## 2019-05-04 DIAGNOSIS — N170 Acute kidney failure with tubular necrosis: Secondary | ICD-10-CM | POA: Diagnosis present

## 2019-05-04 DIAGNOSIS — N179 Acute kidney failure, unspecified: Secondary | ICD-10-CM | POA: Diagnosis not present

## 2019-05-04 DIAGNOSIS — E875 Hyperkalemia: Secondary | ICD-10-CM | POA: Diagnosis present

## 2019-05-04 DIAGNOSIS — Z8674 Personal history of sudden cardiac arrest: Secondary | ICD-10-CM | POA: Diagnosis not present

## 2019-05-04 DIAGNOSIS — R339 Retention of urine, unspecified: Secondary | ICD-10-CM | POA: Diagnosis not present

## 2019-05-04 DIAGNOSIS — I2109 ST elevation (STEMI) myocardial infarction involving other coronary artery of anterior wall: Secondary | ICD-10-CM | POA: Diagnosis not present

## 2019-05-04 DIAGNOSIS — R7309 Other abnormal glucose: Secondary | ICD-10-CM

## 2019-05-04 DIAGNOSIS — I5043 Acute on chronic combined systolic (congestive) and diastolic (congestive) heart failure: Secondary | ICD-10-CM

## 2019-05-04 DIAGNOSIS — D473 Essential (hemorrhagic) thrombocythemia: Secondary | ICD-10-CM

## 2019-05-04 DIAGNOSIS — R1312 Dysphagia, oropharyngeal phase: Secondary | ICD-10-CM | POA: Diagnosis present

## 2019-05-04 DIAGNOSIS — E1165 Type 2 diabetes mellitus with hyperglycemia: Secondary | ICD-10-CM | POA: Diagnosis not present

## 2019-05-04 DIAGNOSIS — K5901 Slow transit constipation: Secondary | ICD-10-CM | POA: Diagnosis not present

## 2019-05-04 DIAGNOSIS — R0789 Other chest pain: Secondary | ICD-10-CM

## 2019-05-04 DIAGNOSIS — D72829 Elevated white blood cell count, unspecified: Secondary | ICD-10-CM

## 2019-05-04 DIAGNOSIS — R570 Cardiogenic shock: Secondary | ICD-10-CM | POA: Diagnosis not present

## 2019-05-04 DIAGNOSIS — G931 Anoxic brain damage, not elsewhere classified: Secondary | ICD-10-CM | POA: Diagnosis not present

## 2019-05-04 DIAGNOSIS — I255 Ischemic cardiomyopathy: Secondary | ICD-10-CM | POA: Diagnosis not present

## 2019-05-04 DIAGNOSIS — D649 Anemia, unspecified: Secondary | ICD-10-CM | POA: Diagnosis not present

## 2019-05-04 DIAGNOSIS — J9601 Acute respiratory failure with hypoxia: Secondary | ICD-10-CM | POA: Diagnosis not present

## 2019-05-04 DIAGNOSIS — T380X5A Adverse effect of glucocorticoids and synthetic analogues, initial encounter: Secondary | ICD-10-CM | POA: Diagnosis not present

## 2019-05-04 DIAGNOSIS — D75839 Thrombocytosis, unspecified: Secondary | ICD-10-CM

## 2019-05-04 LAB — GLUCOSE, CAPILLARY
Glucose-Capillary: 125 mg/dL — ABNORMAL HIGH (ref 70–99)
Glucose-Capillary: 136 mg/dL — ABNORMAL HIGH (ref 70–99)
Glucose-Capillary: 137 mg/dL — ABNORMAL HIGH (ref 70–99)
Glucose-Capillary: 157 mg/dL — ABNORMAL HIGH (ref 70–99)
Glucose-Capillary: 284 mg/dL — ABNORMAL HIGH (ref 70–99)
Glucose-Capillary: 307 mg/dL — ABNORMAL HIGH (ref 70–99)
Glucose-Capillary: 86 mg/dL (ref 70–99)

## 2019-05-04 LAB — CBC
HCT: 26.5 % — ABNORMAL LOW (ref 39.0–52.0)
Hemoglobin: 8.5 g/dL — ABNORMAL LOW (ref 13.0–17.0)
MCH: 31 pg (ref 26.0–34.0)
MCHC: 32.1 g/dL (ref 30.0–36.0)
MCV: 96.7 fL (ref 80.0–100.0)
Platelets: 896 10*3/uL — ABNORMAL HIGH (ref 150–400)
RBC: 2.74 MIL/uL — ABNORMAL LOW (ref 4.22–5.81)
RDW: 16 % — ABNORMAL HIGH (ref 11.5–15.5)
WBC: 23.8 10*3/uL — ABNORMAL HIGH (ref 4.0–10.5)
nRBC: 0 % (ref 0.0–0.2)

## 2019-05-04 LAB — COOXEMETRY PANEL
Carboxyhemoglobin: 1.7 % — ABNORMAL HIGH (ref 0.5–1.5)
Methemoglobin: 1 % (ref 0.0–1.5)
O2 Saturation: 67.3 %
Total hemoglobin: 8.8 g/dL — ABNORMAL LOW (ref 12.0–16.0)

## 2019-05-04 LAB — RENAL FUNCTION PANEL
Albumin: 2.3 g/dL — ABNORMAL LOW (ref 3.5–5.0)
Anion gap: 10 (ref 5–15)
BUN: 110 mg/dL — ABNORMAL HIGH (ref 8–23)
CO2: 24 mmol/L (ref 22–32)
Calcium: 8.4 mg/dL — ABNORMAL LOW (ref 8.9–10.3)
Chloride: 110 mmol/L (ref 98–111)
Creatinine, Ser: 3.1 mg/dL — ABNORMAL HIGH (ref 0.61–1.24)
GFR calc Af Amer: 23 mL/min — ABNORMAL LOW (ref >60)
GFR calc non Af Amer: 20 mL/min — ABNORMAL LOW (ref >60)
Glucose, Bld: 86 mg/dL (ref 70–99)
Phosphorus: 6.1 mg/dL — ABNORMAL HIGH (ref 2.5–4.6)
Potassium: 4.2 mmol/L (ref 3.5–5.1)
Sodium: 144 mmol/L (ref 135–145)

## 2019-05-04 MED ORDER — INSULIN DETEMIR 100 UNIT/ML ~~LOC~~ SOLN: 5.0000 [IU] | Freq: Two times a day (BID) | SUBCUTANEOUS | 11 refills | Status: DC

## 2019-05-04 MED ORDER — QUETIAPINE FUMARATE 50 MG PO TABS
50.0000 mg | ORAL_TABLET | Freq: Two times a day (BID) | ORAL | Status: DC
  Administered 2019-05-04: 50 mg via ORAL
  Filled 2019-05-04: qty 1

## 2019-05-04 MED ORDER — INSULIN ASPART 100 UNIT/ML ~~LOC~~ SOLN
0.0000 [IU] | Freq: Three times a day (TID) | SUBCUTANEOUS | Status: DC
  Administered 2019-05-04: 18:00:00 11 [IU] via SUBCUTANEOUS

## 2019-05-04 MED ORDER — PREDNISONE 20 MG PO TABS: 40.0000 mg | ORAL_TABLET | Freq: Every day | ORAL | Status: DC

## 2019-05-04 MED ORDER — TAMSULOSIN HCL 0.4 MG PO CAPS: 0.4000 mg | ORAL_CAPSULE | Freq: Every day | ORAL | Status: DC

## 2019-05-04 MED ORDER — ASPIRIN 81 MG PO CHEW: 81.0000 mg | CHEWABLE_TABLET | Freq: Every day | ORAL | Status: AC

## 2019-05-04 MED ORDER — ATORVASTATIN CALCIUM 80 MG PO TABS: 80.0000 mg | ORAL_TABLET | Freq: Every day | ORAL | Status: DC

## 2019-05-04 MED ORDER — QUETIAPINE FUMARATE 50 MG PO TABS: 50.0000 mg | ORAL_TABLET | Freq: Two times a day (BID) | ORAL | Status: DC

## 2019-05-04 MED ORDER — ENSURE ENLIVE PO LIQD: 237.0000 mL | Freq: Two times a day (BID) | ORAL | 12 refills | Status: DC

## 2019-05-04 MED ORDER — FAMOTIDINE 20 MG PO TABS: 20.0000 mg | ORAL_TABLET | Freq: Every day | ORAL | Status: DC

## 2019-05-04 MED ORDER — INSULIN ASPART 100 UNIT/ML ~~LOC~~ SOLN: 3.0000 [IU] | SUBCUTANEOUS | 11 refills | Status: DC

## 2019-05-04 MED ORDER — HEPARIN SODIUM (PORCINE) 5000 UNIT/ML IJ SOLN: 5000.0000 [IU] | Freq: Three times a day (TID) | INTRAMUSCULAR | Status: DC

## 2019-05-04 MED ORDER — CHLORHEXIDINE GLUCONATE 0.12 % MT SOLN: 15.0000 mL | Freq: Two times a day (BID) | OROMUCOSAL | 0 refills | Status: DC

## 2019-05-04 MED ORDER — ONDANSETRON HCL 4 MG/2ML IJ SOLN: 4.0000 mg | Freq: Four times a day (QID) | INTRAMUSCULAR | 0 refills | Status: DC | PRN

## 2019-05-04 MED ORDER — HEPARIN SODIUM (PORCINE) 1000 UNIT/ML DIALYSIS: 1000.0000 [IU] | INTRAMUSCULAR | Status: DC | PRN

## 2019-05-04 MED ORDER — B COMPLEX-C PO TABS: 1.0000 | ORAL_TABLET | Freq: Every day | ORAL | Status: AC

## 2019-05-04 MED ORDER — HYDRALAZINE HCL 20 MG/ML IJ SOLN: 20.0000 mg | Freq: Two times a day (BID) | INTRAMUSCULAR | Status: DC | PRN

## 2019-05-04 MED ORDER — LEVALBUTEROL HCL 0.63 MG/3ML IN NEBU: 0.6300 mg | INHALATION_SOLUTION | RESPIRATORY_TRACT | 12 refills | Status: DC | PRN

## 2019-05-04 MED ORDER — CARVEDILOL 3.125 MG PO TABS: 3.1250 mg | ORAL_TABLET | Freq: Two times a day (BID) | ORAL | Status: DC

## 2019-05-04 NOTE — Progress Notes (Signed)
Inpatient Rehabilitation-Admissions Coordinator   [facility] has received insurance approval for admit to CIR as well as medical clearance from Dr. Haroldine Laws. Will proceed with admission to CIR today. RN and CM aware of plan. Pt, daughter, and wife aware of plan. Consent signed and letter provided.   Please call if questions.   Jhonnie Garner, OTR/L  Rehab Admissions Coordinator  [phone] 05/04/2019 4:51 PM

## 2019-05-04 NOTE — Progress Notes (Signed)
Pt arrived on unit. Pt has no c/o pain. Pt fine and resting, states he's just tired. Provider notified, will continue to monitor.

## 2019-05-04 NOTE — Progress Notes (Signed)
Patient transported to [id number] by RN and CNA. Glasses and case were sent with patient. No other belongings in room. Patient remained stable during transport. [facility] Radiation protection practitioner received transport. Patient moved from ICU bed to room bed with no complications. Receiving RN has no questions or concerns at this time.

## 2019-05-04 NOTE — H&P (Signed)
Physical Medicine and Rehabilitation Admission H&P     CC: Anoxic brain injury with debility     HPI: Lawrence Olson is a 68 year old male with no prior medical issues who was admitted on 04/16/19 after witnessed cardiac arrest at work. Question of onlooker CPR-- he was found to have VT and required CPR/ACLS protocol approx 20 minutes with king airway placed by EMS. He as intubated in ED, noted to be severely hypoxic and hypercapneic and found to have ST elevation due to MI. Despite prolonged downtime, some purposeful movement noted and he was taken to cath lab for emergent catheterization. Attempts at opening LAD unsuccessful with development of VT and impella placed with pressors for cardiogenic shock.  Hospital course significant for oozing from left IJ as well as issues with hematuria, fevers treated with empiric antibiotics, ectopy requiring amiodarone as well as need for suture repair of right femoral arterial puncture site as well as L-IJ central line.   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 He was started on IV keppra due to high risk of seizures and EEG done revealing moderate to severe diffuse encephalopathy.    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 Stroke question due to small vessel disease v/s embolic and ASA recommended for secondary stroke prevention.  As mentation started improving without definate seizures, keppra was discontinued on 09/01.  Fluid overload treated with IV diuresis but he developed multifactorial AKI likely due to ATN. Dr. Noel Journey consulted 9/03  and CRRT initiated due to ongoing issues with overload. He continued to have issues with fevers and Vancomycin resumed/meropenum added 9/6. He has had issues with delirium and agitation --LTM EEG negative for seizures  and prolonged encephalopathy felt to be due to ischemic BI. 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 Antibiotics d/c as cultures negative and CRRT discontinued as fluid status improved with good UOP.  Renal status improving. Follow up echo with EF 25-30% and Life Vest to be ordered.  MBS done 9/10 and he was started on dysphagia 2, thins. Therapy ongoing and patient noted to be limited by signifiant debility. CIR recommended due to functional deccline.      Review of Systems  Constitutional: Negative for chills and fever.  HENT: Negative for hearing loss and tinnitus.   Eyes: Negative for blurred vision and double vision.  Respiratory: Negative for cough and shortness of breath.   Cardiovascular: Negative for chest pain, palpitations and leg swelling.  Gastrointestinal: Negative for diarrhea and heartburn.  Genitourinary: Negative for dysuria and urgency.  Musculoskeletal: Negative for joint pain and myalgias.  Skin: Negative for rash.  Neurological: Positive for weakness. Negative for dizziness, sensory change and headaches.  Psychiatric/Behavioral: Positive for memory loss. The patient does not have insomnia.           Past Medical History:  Diagnosis Date   Jaundice      needed hospitalization in [place]           Past Surgical History:  Procedure Laterality Date   RIGHT HEART CATH AND CORONARY ANGIOGRAPHY N/A 04/16/2019    Procedure: RIGHT HEART CATH AND CORONARY ANGIOGRAPHY;  Surgeon: Troy Sine, MD;  Location: [facility];  Service: Cardiovascular;  Laterality: N/A;   VENTRICULAR ASSIST DEVICE INSERTION N/A 04/16/2019    Procedure: VENTRICULAR ASSIST DEVICE INSERTION;  Surgeon: Troy Sine, MD;  Location: [facility];  Service: Cardiovascular;  Laterality: N/A;          Family History  Problem Relation Age of Onset   Healthy Brother     Healthy  Sister        Social History:  Married. Independent and was still working PTA. He used to smoke cigarettes -quit 19 years ago. He used to smoke about 1/2 PPD for about 40 years.  He has never used smokeless tobacco. He has not had any alcohol for 4 years.  No history on file for drug.      Allergies: No Known Allergies            Medications Prior to Admission  Medication Sig Dispense Refill   fluocinolone (SYNALAR) 0.01 % external solution Apply 1 application topically 2 (two) times daily as needed (skin care).        ketoconazole (NIZORAL) 2 % shampoo Apply 5 mLs topically daily as needed for irritation.          Drug Regimen Review  Drug regimen was reviewed and remains appropriate with no significant issues identified   Home: Home Living Family/patient expects to be discharged to:: Private residence Living Arrangements: Spouse/significant other, Children Available Help at Discharge: Family Type of Home: House Home Access: Stairs to enter Technical brewer of Steps: 1 Home Layout: Two level Alternate Level Stairs-Number of Steps: 10 Alternate Level Stairs-Rails: Right Home Equipment: None   Functional History: Prior Function Level of Independence: Independent   Functional Status:  Mobility: Bed Mobility Overal bed mobility: Needs Assistance Bed Mobility: Rolling, Sidelying to Sit Rolling: Mod assist, +2 for physical assistance Sidelying to sit: Max assist, +2 for physical assistance Sit to supine: Max assist, +2 for physical assistance General bed mobility comments: Pt assisted needing assist for trunk elevation.  Pt needed assist to move hips to EOB as well. Does not use his UEs as he should to assist with mobility.  Transfers Overall transfer level: Needs assistance Transfer via Lift Equipment: Stedy Transfers: Sit to/from Stand, Risk manager Sit to Stand: +2 physical assistance, Mod assist, Max assist Stand pivot transfers: Total assist, +2  physical assistance General transfer comment: Pt needed mod assist for initial stand to [facility] with pt not standing all the way up.  Does not use UEs to pull up on STedy nor does he stand upright fully.  Pt also with right lateral lean significant at times needing total assist to sit up.   Moved pt to recliner with pt sitting on STedy needing  total assist as he leans so heavy to right and anterior and even with interpreter would not sit up.  Last stand was max assist due to pt fatigue with right lateral lean.  Pt told interpreter "I can't get my body to do what I want. Will that iimprove?" Ambulation/Gait General Gait Details: pt unable      ADL: ADL Overall ADL's : Needs assistance/impaired Eating/Feeding: NPO Grooming: Maximal assistance, Sitting Upper Body Bathing: Maximal assistance, Sitting Lower Body Bathing: Total assistance, +2 for safety/equipment, +2 for physical assistance, Sit to/from stand Upper Body Dressing : Total assistance, Sitting Lower Body Dressing: +2 for physical assistance, Total assistance, +2 for safety/equipment, Sit to/from stand Toilet Transfer: +2 for physical assistance, +2 for safety/equipment, Maximal assistance, Total assistance, Stand-pivot Toilet Transfer Details (indicate cue  type and reason): using stedy Toileting- Clothing Manipulation and Hygiene: Total assistance, +2 for physical assistance, +2 for safety/equipment, Sit to/from stand Functional mobility during ADLs: Maximal assistance, Total assistance, +2 for safety/equipment, +2 for physical assistance General ADL Comments: pt limited by weakness, impaired balance, R lateral lean, cognition   Cognition: Cognition Overall Cognitive Status: Impaired/Different from baseline Orientation Level: Oriented to person, Oriented to place, Oriented to situation, Disoriented to time Cognition Arousal/Alertness: Awake/alert Behavior During Therapy: Restless Overall Cognitive Status: Impaired/Different from  baseline Area of Impairment: Orientation, Memory, Following commands, Safety/judgement, Awareness, Problem solving, Attention Orientation Level: Disoriented to, Time(did not ask situation) Current Attention Level: Focused Memory: Decreased short-term memory, Decreased recall of precautions Following Commands: Follows one step commands with increased time Safety/Judgement: Decreased awareness of safety, Decreased awareness of deficits Awareness: Emergent Problem Solving: Difficulty sequencing, Requires verbal cues, Requires tactile cues, Decreased initiation, Slow processing General Comments: pt able to follow commands given increased time, slow procesing and poor sequencing; translator Tatjana # [id number] used.  Difficult to assess due to: Intubated, Non-English speaking       Blood pressure 128/71, pulse 83, temperature 98.3 F (36.8 C), temperature source Oral, resp. rate 14, height '5\' 6"'  (1.676 m), weight 82.3 kg, SpO2 97 %. Physical Exam  Nursing note and vitals reviewed. Constitutional: He appears well-developed and well-nourished.  Cardiovascular: Normal rate.  Respiratory: Effort normal.  Musculoskeletal:        General: No edema.  Neurological: He is alert.  Oriented to self and initially thought that he was at home. Occasional rambling with confused speech. Slow to process with question component of anxiety. He is able  to follow simple motor commands.   Skin:  Diffuse ecchymosis bilateral groin and with indurated area right groin.        Lab Results Last 48 Hours  Results for orders placed or performed during the hospital encounter of 04/16/19 (from the past 48 hour(s))  Glucose, capillary     Status: Abnormal    Collection Time: 05/02/19  3:43 PM  Result Value Ref Range    Glucose-Capillary 188 (H) 70 - 99 mg/dL  Glucose, capillary     Status: Abnormal    Collection Time: 05/02/19  7:45 PM  Result Value Ref Range    Glucose-Capillary 150 (H) 70 - 99 mg/dL  Glucose,  capillary     Status: Abnormal    Collection Time: 05/02/19 11:14 PM  Result Value Ref Range    Glucose-Capillary 156 (H) 70 - 99 mg/dL  Glucose, capillary     Status: Abnormal    Collection Time: 05/03/19  3:35 AM  Result Value Ref Range    Glucose-Capillary 127 (H) 70 - 99 mg/dL  Renal function panel (daily at 0500)     Status: Abnormal    Collection Time: 05/03/19  4:40 AM  Result Value Ref Range    Sodium 143 135 - 145 mmol/L    Potassium 4.3 3.5 - 5.1 mmol/L    Chloride 108 98 - 111 mmol/L    CO2 23 22 - 32 mmol/L    Glucose, Bld 149 (H) 70 - 99 mg/dL    BUN 111 (H) 8 - 23 mg/dL    Creatinine, Ser 3.20 (H) 0.61 - 1.24 mg/dL    Calcium 8.3 (L) 8.9 - 10.3 mg/dL    Phosphorus 7.9 (H) 2.5 - 4.6 mg/dL    Albumin 2.2 (L) 3.5 - 5.0 g/dL    GFR calc non Af Amer 19 (L) >  60 mL/min    GFR calc Af Amer 22 (L) >60 mL/min    Anion gap 12 5 - 15      Comment: Performed at [facility] [address], [place],  [address]  .Cooxemetry Panel (carboxy, met, total hgb, O2 sat)     Status: Abnormal    Collection Time: 05/03/19  4:46 AM  Result Value Ref Range    Total hemoglobin 8.3 (L) 12.0 - 16.0 g/dL    O2 Saturation 72.2 %    Carboxyhemoglobin 1.5 0.5 - 1.5 %    Methemoglobin 0.9 0.0 - 1.5 %  Glucose, capillary     Status: Abnormal    Collection Time: 05/03/19  7:49 AM  Result Value Ref Range    Glucose-Capillary 117 (H) 70 - 99 mg/dL  Glucose, capillary     Status: Abnormal    Collection Time: 05/03/19 12:38 PM  Result Value Ref Range    Glucose-Capillary 249 (H) 70 - 99 mg/dL  Glucose, capillary     Status: Abnormal    Collection Time: 05/03/19  4:17 PM  Result Value Ref Range    Glucose-Capillary 180 (H) 70 - 99 mg/dL  Glucose, capillary     Status: Abnormal    Collection Time: 05/03/19  7:39 PM  Result Value Ref Range    Glucose-Capillary 228 (H) 70 - 99 mg/dL  Glucose, capillary     Status: Abnormal    Collection Time: 05/03/19 11:38 PM  Result Value Ref Range     Glucose-Capillary 157 (H) 70 - 99 mg/dL  Glucose, capillary     Status: Abnormal    Collection Time: 05/04/19 12:51 AM  Result Value Ref Range    Glucose-Capillary 125 (H) 70 - 99 mg/dL    Comment 1 Notify RN      Comment 2 Document in Chart    Glucose, capillary     Status: None    Collection Time: 05/04/19  5:17 AM  Result Value Ref Range    Glucose-Capillary 86 70 - 99 mg/dL    Comment 1 Notify RN      Comment 2 Document in Chart    Renal function panel (daily at 0500)     Status: Abnormal    Collection Time: 05/04/19  5:21 AM  Result Value Ref Range    Sodium 144 135 - 145 mmol/L    Potassium 4.2 3.5 - 5.1 mmol/L    Chloride 110 98 - 111 mmol/L    CO2 24 22 - 32 mmol/L    Glucose, Bld 86 70 - 99 mg/dL    BUN 110 (H) 8 - 23 mg/dL    Creatinine, Ser 3.10 (H) 0.61 - 1.24 mg/dL    Calcium 8.4 (L) 8.9 - 10.3 mg/dL    Phosphorus 6.1 (H) 2.5 - 4.6 mg/dL    Albumin 2.3 (L) 3.5 - 5.0 g/dL    GFR calc non Af Amer 20 (L) >60 mL/min    GFR calc Af Amer 23 (L) >60 mL/min    Anion gap 10 5 - 15      Comment: Performed at [facility], [address] [place] [address]  CBC     Status: Abnormal    Collection Time: 05/04/19  5:21 AM  Result Value Ref Range    WBC 23.8 (H) 4.0 - 10.5 K/uL    RBC 2.74 (L) 4.22 - 5.81 MIL/uL    Hemoglobin 8.5 (L) 13.0 - 17.0 g/dL  HCT 26.5 (L) 39.0 - 52.0 %    MCV 96.7 80.0 - 100.0 fL    MCH 31.0 26.0 - 34.0 pg    MCHC 32.1 30.0 - 36.0 g/dL    RDW 16.0 (H) 11.5 - 15.5 %    Platelets 896 (H) 150 - 400 K/uL      Comment: REPEATED TO VERIFY    nRBC 0.0 0.0 - 0.2 %      Comment: Performed at [facility] [address] [place], [place] [address]  .Cooxemetry Panel (carboxy, met, total hgb, O2 sat)     Status: Abnormal    Collection Time: 05/04/19  5:50 AM  Result Value Ref Range    Total hemoglobin 8.8 (L) 12.0 - 16.0 g/dL    O2 Saturation 67.3 %    Carboxyhemoglobin 1.7 (H) 0.5 - 1.5 %    Methemoglobin 1.0 0.0 - 1.5 %    Glucose, capillary     Status: Abnormal    Collection Time: 05/04/19  8:09 AM  Result Value Ref Range    Glucose-Capillary 136 (H) 70 - 99 mg/dL    Comment 1 Notify RN      Comment 2 Document in Chart    Glucose, capillary     Status: Abnormal    Collection Time: 05/04/19 11:52 AM  Result Value Ref Range    Glucose-Capillary 137 (H) 70 - 99 mg/dL    Comment 1 Notify RN      Comment 2 Document in Chart        Imaging Results (Last 48 hours)  Dg Swallowing Func-speech Pathology   Result Date: 05/03/2019 Objective Swallowing Evaluation: Type of Study: MBS-Modified Barium Swallow Study  Patient Details Name: Lawrence Olson [name] [id number] Date of Birth: 01/28/1951 Today's Date: 05/03/2019 Time: SLP Start Time (ACUTE ONLY): 0935 -SLP Stop Time (ACUTE ONLY): 0950 SLP Time Calculation (min) (ACUTE ONLY): 15 min Past Medical History: No past medical history on file. Past Surgical History: Past Surgical History: Procedure Laterality Date  RIGHT HEART CATH AND CORONARY ANGIOGRAPHY N/A 04/16/2019  Procedure: RIGHT HEART CATH AND CORONARY ANGIOGRAPHY;  Surgeon: Troy Sine, MD;  Location: [facility];  Service: Cardiovascular;  Laterality: N/A;  VENTRICULAR ASSIST DEVICE INSERTION N/A 04/16/2019  Procedure: VENTRICULAR ASSIST DEVICE INSERTION;  Surgeon: Troy Sine, MD;  Location: [facility];  Service: Cardiovascular;  Laterality: N/A; HPI: 68 yo M out of hospital cardiac arrest with ROSC, intubated 8/24-9/8, found to have STEMI, taken emergently to cath lab. Downtime ~20 minutes with anoxic brain injury and significant encephalopathy.  Demonstrating posturing initially, then responding some to family when speaking [place].  No known medical hx,  Subjective: Pt was encountered awake/alert and he was agreeable to MBS. Assessment / Plan / Recommendation CHL IP CLINICAL IMPRESSIONS 05/03/2019 Clinical Impression Lawrence Olson is a 68 year old male who presents with mild oropharyngeal dysphagia  with resultant shallow, transient laryngeal penetration of thin liquid and moderate-severe vallecular residue with solids on today's exam.   Pt consumed trials of thin liquid, puree, regular solids, and a barium pill whole in puree.  Pt exhibited prolonged mastication of regular solids and reduced lingual strength, resulting in trace-mild oral residue with puree and solid trials.  Pharyngeal phase was remarkable for reduced pharyngeal constriction, resulting in moderate-severe vallecular residue with solids.  Pt additionally presented with reduced anterior movement of the hyolaryngeal segment and mildly reduced BOT retraction, which may also have contributed to the vallecular residue.  During barium pill trial, pt was unable to propel the bolus posteriorly, and he subsequently expelled it from his oral cavity.  Recommend Dysphagia 2 (fine chop) solids and thin liquid with use of aspiration precautions and medications administered crushed or cut in half in puree.  Suspect that pt will be able to tolerate increasingly complex solids given time and improved strength.  SLP Visit Diagnosis Dysphagia, oropharyngeal phase (R13.12) Attention and concentration deficit following -- Frontal lobe and executive function deficit following -- Impact on safety and function Mild aspiration risk   CHL IP TREATMENT RECOMMENDATION 05/02/2019 Treatment Recommendations Defer until completion of intrumental exam   Prognosis 05/03/2019 Prognosis for Safe Diet Advancement Good Barriers to Reach Goals -- Barriers/Prognosis Comment -- CHL IP DIET RECOMMENDATION 05/03/2019 SLP Diet Recommendations Dysphagia 2 (Fine chop) solids;Thin liquid Liquid Administration via Straw;Cup Medication Administration Other (Comment) Compensations Slow rate;Small sips/bites;Follow solids with liquid Postural Changes Seated upright at 90 degrees   CHL IP OTHER RECOMMENDATIONS 05/03/2019 Recommended Consults -- Oral Care Recommendations Oral care BID Other  Recommendations --   CHL IP FOLLOW UP RECOMMENDATIONS 05/02/2019 Follow up Recommendations Inpatient Rehab   CHL IP FREQUENCY AND DURATION 05/03/2019 Speech Therapy Frequency (ACUTE ONLY) min 2x/week Treatment Duration 2 weeks      CHL IP ORAL PHASE 05/03/2019 Oral Phase Impaired Oral - Pudding Teaspoon -- Oral - Pudding Cup -- Oral - Honey Teaspoon -- Oral - Honey Cup -- Oral - Nectar Teaspoon -- Oral - Nectar Cup -- Oral - Nectar Straw -- Oral - Thin Teaspoon Premature spillage Oral - Thin Cup Premature spillage;Piecemeal swallowing Oral - Thin Straw Premature spillage Oral - Puree Delayed oral transit Oral - Mech Soft -- Oral - Regular Piecemeal swallowing;Delayed oral transit;Impaired mastication;Decreased bolus cohesion Oral - Multi-Consistency -- Oral - Pill Weak lingual manipulation;Other (Comment) Oral Phase - Comment --  CHL IP PHARYNGEAL PHASE 05/03/2019 Pharyngeal Phase Impaired Pharyngeal- Pudding Teaspoon -- Pharyngeal -- Pharyngeal- Pudding Cup -- Pharyngeal -- Pharyngeal- Honey Teaspoon -- Pharyngeal -- Pharyngeal- Honey Cup -- Pharyngeal -- Pharyngeal- Nectar Teaspoon -- Pharyngeal -- Pharyngeal- Nectar Cup -- Pharyngeal -- Pharyngeal- Nectar Straw -- Pharyngeal -- Pharyngeal- Thin Teaspoon Delayed swallow initiation-pyriform sinuses Pharyngeal Material does not enter airway Pharyngeal- Thin Cup Delayed swallow initiation-pyriform sinuses;Reduced anterior laryngeal mobility;Reduced airway/laryngeal closure;Penetration/Aspiration before swallow;Pharyngeal residue - valleculae;Pharyngeal residue - pyriform Pharyngeal Material enters airway, remains ABOVE vocal cords then ejected out Pharyngeal- Thin Straw Delayed swallow initiation-pyriform sinuses;Reduced anterior laryngeal mobility;Reduced airway/laryngeal closure;Penetration/Aspiration before swallow;Pharyngeal residue - valleculae;Pharyngeal residue - pyriform Pharyngeal Material enters airway, remains ABOVE vocal cords then ejected out Pharyngeal-  Puree Delayed swallow initiation-vallecula;Reduced anterior laryngeal mobility;Reduced tongue base retraction;Pharyngeal residue - valleculae;Reduced pharyngeal peristalsis Pharyngeal Material does not enter airway Pharyngeal- Mechanical Soft -- Pharyngeal -- Pharyngeal- Regular Delayed swallow initiation-vallecula;Reduced anterior laryngeal mobility;Reduced tongue base retraction;Pharyngeal residue - valleculae;Reduced pharyngeal peristalsis Pharyngeal Material does not enter airway Pharyngeal- Multi-consistency -- Pharyngeal -- Pharyngeal- Pill -- Pharyngeal -- Pharyngeal Comment --  No flowsheet data found. Lawrence Olson 05/03/2019, 1:37 PM  Bretta Bang, M.S., [facility] Office: [phone]                      Medical Problem List and Plan: 1.  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  Antithrombotics: -DVT/anticoagulation:  Pharmaceutical: Heparin             -  antiplatelet therapy: ASA 3. Pain Management: N/A 4. Mood: LCSW to follow for evaluation and support.  Does not have full awareness of deficits.             -antipsychotic agents: On Seroquel bid 5. Neuropsych: This patient is not capable of making decisions on his own behalf. 6. Skin/Wound Care: Routine pressure relief measures 7. Fluids/Electrolytes/Nutrition: strict I/O. Check lytes in am.  8.  CAD with STEMI/VT arrest: Treated medically for now.              -LifeVest ordered and to be fitted tonight or tomorrow morning per cardiology. Cardiology is aware that there is NOT continuous monitoring on inpatient rehab as we await lifevest.             -continue Coreg twice daily and Lipitor 9.  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  T2DM: Hemoglobin A1c seven-point 11.  Acute renal failure due to ischemic ATN: Urine output improving. Cr around 3.1             -nephrology following             -daily labs 12.  Urine retention:             --monitor bladder emptying with PVR checks.               On Flomax for urinary retention?  13.  Acute blood loss anemia: Recheck in a.m. H&H stable 14.  Combined heart failure/ischemic cardiomyopathy: Monitor for signs of overload and check weights daily.  On Coreg twice daily.  No ACE/arms or BB due to AKI and shock 15.  Leukocytosis/Thrombocytosis: Afebrile off antibiotics.  Improving and felt to be reactive/secondary to steroids.  Monitor for signs of infection.         Bary Leriche, PA-C 05/04/2019  Meredith Staggers, MD, [facility] 05/04/2019

## 2019-05-04 NOTE — Progress Notes (Signed)
Lawrence Staggers, MD  Physician  Physical Medicine and Rehabilitation  PMR Pre-admission  Signed  Date of Service:  05/03/2019 11:44 AM      Related encounter: ED to Hosp-Admission (Current) from 04/16/2019 in [facility]      Signed         PMR Admission Coordinator Pre-Admission Assessment  Patient: Lawrence Olson is an 68 y.o., male [name] [id number] DOB: 05/19/1951 Height: '5\' 6"'  (167.6 cm) Weight: 82.3 kg  Insurance Information HMO:     PPO: yes     PCP:      IPA:      80/20:      OTHER:  PRIMARY: [employer]      Policy#: [id number]      Subscriber: Patient CM Name: Lawrence Olson      Phone#: [phone]     Fax#: [phone] Pre-Cert#: [id number]      Employer:  Lawrence Olson provided by Lawrence Olson on 9/11 for admit to CIR 9/11. Pt is approved for 5 days with pt to be reassessed for appropriateness on day 5, 9/15. Auth [id number]. HTA has Epic access. Per Lawrence Olson, any weekend needs can call [phone] Benefits:  Phone #: [phone], option 1     Name: Lawrence Olson. Date: 08/23/2018 - still active     Deduct: $0      Out of Pocket Max: $3,400 ($60.00 met)      Life Max:  CIR: $295/day co-pay for days 1-6, $0/day for days 7-90.       SNF: $20/day co-pay for days 1-20, $160/day co-pay for days 21-100; limited to 100 days/benefit period  Outpatient: limited by medical necessity     Co-Pay: $15/visit co-pay  Home Health: 100% coverage; limited by medical necessity      Co-Pay: 0% co-insurance DME: 80% coverage     Co-Pay: 20% co-insurance.  Providers:  SECONDARY: None       Policy#:       Subscriber:  CM Name:       Phone#:      Fax#:  Pre-Cert#:       Employer:  Benefits:  Phone #:      Name:  Olson. Date:      Deduct:       Out of Pocket Max:       Life Max:  CIR:       SNF:  Outpatient:      Co-Pay:  Home Health:       Co-Pay:  DME:      Co-Pay:   Medicaid Application Date:       Case Manager:  Disability Application Date:       Case Worker:   The "Data  Collection Information Summary" for patients in Inpatient Rehabilitation Facilities with attached "Privacy Act [place] Records" was provided and verbally reviewed with: Family  Emergency Contact Information         Contact Information    Name Relation Home Work Mobile   Lawrence Olson Spouse [phone]  [phone]   Lawrence Olson Daughter   [phone]      Current Medical History  Patient Admitting Diagnosis: Debility after cardiac arrest and ABI  History of Present Illness: Lawrence Olson is a 68 year old male with no prior medical issues who was admitted on 04/16/19 after witnessed cardiac arrest at work. Question of onlooker CPR-- he was found to have VT and required CPR/ACLS protocol approx 20 minutes with king airway placed by EMS. He was intubated in  ED, noted to be severely hypoxic and hypercapneic and found to have ST elevation due to MI. Despite prolonged downtime, some purposeful movement noted and he was taken to cath lab for emergent catheterization. Attempts at opening LAD unsuccessful with development of VT and impella placed with pressors for cardiogenic shock. Hospital course significant for oozing from left IJ as well as issues with hematuria, fevers treated with empiric antibiotics, ectopy requiring amiodarone as well as need for suture repair of right femoral arterial puncture site as well as L-IJ central line. 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 He was started on IV keppra due to high risk of seizures and EEG done revealing moderate to severe diffuse encephalopathy.   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 Stroke question due to small vessel disease v/s embolic and ASA recommended for secondary stroke prevention. As mentation started improving  without definate seizures, keppra was discontinued on 09/01.  Fluid overload treated with IV diuresis but he developed multifactorial AKI likely due to ATN. Dr. Noel Journey consulted 9/03 and CRRT initiated due to ongoing issues with overload. He continued to have issues with fevers and Vanc resumed/meropenum added 9/6. He has had issues with delirium and agitation --LTM EEG negative for seizures and prolonged encephalopathy felt to be due to ischemic BI. 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 Antibiotics d/c as cultures negative and CRRT discontinued as fluid status improved with good UOP. Renal status improving. Follow up echo with EF 25-30% and Life Vest to be ordered. MBS done 9/10 and he was started on dysphagia 2, thins.  Therapy ongoing and patient noted to be limited by signifiant debility. CIR recommended due to functional deccline. Pt is to be admitted to CIR on 05/04/2019.    Complete NIHSS TOTAL: 6  Patient's medical record from [facility] has been reviewed by the rehabilitation admission coordinator and physician.  Past Medical History      Past Medical History:  Diagnosis Date  . Jaundice    needed hospitalization in [place]    Family History   family history includes Healthy in his brother and sister.  Prior Rehab/Hospitalizations Has the patient had prior rehab or hospitalizations prior to admission? No  Has the patient had major surgery during 100 days prior to admission? Yes             Current Medications  Current Facility-Administered Medications:  .   stroke: mapping our early stages of recovery book, , Does not apply, Once, Bensimhon, Shaune Pascal, MD .  0.9 %  sodium chloride infusion, , Intravenous, PRN, Bensimhon, Shaune Pascal, MD, Stopped at 05/02/19 0033 .  aspirin chewable tablet 81 mg, 81 mg, Oral, Daily, Bensimhon, Shaune Pascal, MD, 81 mg at  05/04/19 0927 .  atorvastatin (LIPITOR) tablet 80 mg, 80 mg, Oral, q1800, Bensimhon, Shaune Pascal, MD, 80 mg at 05/03/19 1633 .  B-complex with vitamin C tablet 1 tablet, 1 tablet, Oral, Daily, Bensimhon, Shaune Pascal, MD, 1 tablet at 05/04/19 0927 .  carvedilol (COREG) tablet 3.125 mg, 3.125 mg, Oral, BID WC, Bensimhon, Shaune Pascal, MD, 3.125 mg at 05/04/19 0927 .  chlorhexidine (PERIDEX) 0.12 % solution 15 mL, 15 mL, Mouth Rinse, BID, Bensimhon, Shaune Pascal, MD, 15 mL at  05/04/19 0927 .  Chlorhexidine Gluconate Cloth 2 % PADS 6 each, 6 each, Topical, Daily, Troy Sine, MD .  famotidine (PEPCID) tablet 20 mg, 20 mg, Oral, Daily, Bensimhon, Shaune Pascal, MD, 20 mg at 05/04/19 0927 .  feeding supplement (ENSURE ENLIVE) (ENSURE ENLIVE) liquid 237 mL, 237 mL, Oral, BID BM, Troy Sine, MD, 237 mL at 05/04/19 0928 .  heparin injection 1,000-6,000 Units, 1,000-6,000 Units, CRRT, PRN, Bensimhon, Shaune Pascal, MD, 2,400 Units at 05/01/19 0858 .  heparin injection 5,000 Units, 5,000 Units, Subcutaneous, Q8H, Bensimhon, Shaune Pascal, MD, 5,000 Units at 05/04/19 0650 .  hydrALAZINE (APRESOLINE) injection 20 mg, 20 mg, Intravenous, BID PRN, Bensimhon, Shaune Pascal, MD, 20 mg at 04/26/19 0830 .  influenza vaccine adjuvanted (FLUAD) injection 0.5 mL, 0.5 mL, Intramuscular, Prior to discharge, Bensimhon, Shaune Pascal, MD .  insulin aspart (novoLOG) injection 3-9 Units, 3-9 Units, Subcutaneous, Q4H, Bensimhon, Shaune Pascal, MD, 3 Units at 05/04/19 1237 .  insulin detemir (LEVEMIR) injection 5 Units, 5 Units, Subcutaneous, Q12H, Bensimhon, Shaune Pascal, MD, 5 Units at 05/04/19 0925 .  levalbuterol (XOPENEX) nebulizer solution 0.63 mg, 0.63 mg, Nebulization, Q3H PRN, Bensimhon, Shaune Pascal, MD .  MEDLINE mouth rinse, 15 mL, Mouth Rinse, q12n4p, Bensimhon, Shaune Pascal, MD, 15 mL at 05/04/19 1249 .  ondansetron (ZOFRAN) injection 4 mg, 4 mg, Intravenous, Q6H PRN, Bensimhon, Shaune Pascal, MD .  predniSONE (DELTASONE) tablet 40 mg, 40 mg, Oral, Q breakfast,  Bensimhon, Shaune Pascal, MD, 40 mg at 05/04/19 0926 .  QUEtiapine (SEROQUEL) tablet 50 mg, 50 mg, Oral, BID, Bensimhon, Shaune Pascal, MD .  sodium chloride flush (NS) 0.9 % injection 10 mL, 10 mL, Intravenous, Q12H, Bensimhon, Shaune Pascal, MD, 10 mL at 05/04/19 0928 .  sodium chloride flush (NS) 0.9 % injection 10 mL, 10 mL, Intravenous, PRN, Bensimhon, Daniel R, MD .  sodium chloride flush (NS) 0.9 % injection 10-40 mL, 10-40 mL, Intracatheter, Q12H, Bensimhon, Shaune Pascal, MD, 10 mL at 05/04/19 0928 .  sodium chloride flush (NS) 0.9 % injection 10-40 mL, 10-40 mL, Intracatheter, PRN, Bensimhon, Shaune Pascal, MD, 10 mL at 05/03/19 2116 .  tamsulosin (FLOMAX) capsule 0.4 mg, 0.4 mg, Oral, QPC supper, Bensimhon, Shaune Pascal, MD, 0.4 mg at 05/03/19 1633  Patients Current Diet:     Diet Order                  Diet - low sodium heart healthy         DIET DYS 2 Room service appropriate? Yes with Assist; Fluid consistency: Thin  Diet effective now               Precautions / Restrictions Precautions Precautions: Fall Precaution Comments: intubated Other Brace: mitt on R hand Restrictions Weight Bearing Restrictions: No RLE Weight Bearing: Non weight bearing LLE Weight Bearing: Non weight bearing   Has the patient had 2 or more falls or a fall with injury in the past year? No  Prior Activity Level Community (5-7x/wk): very active PTA. worked full time at [employer]; drove PTA; was Independent PTA  Prior Functional Level Self Care: Did the patient need help bathing, dressing, using the toilet or eating? Independent  Indoor Mobility: Did the patient need assistance with walking from room to room (with or without device)? Independent  Stairs: Did the patient need assistance with internal or external stairs (with or without device)? Independent  Functional Cognition: Did the patient need help planning regular tasks such as shopping or remembering  to take medications? Independent  Home  Assistive Devices / Equipment Home Assistive Devices/Equipment: None Home Equipment: None  Prior Device Use: Indicate devices/aids used by the patient prior to current illness, exacerbation or injury? None of the above  Current Functional Level Cognition  Overall Cognitive Status: Impaired/Different from baseline Difficult to assess due to: Intubated, Non-English speaking Current Attention Level: Focused Orientation Level: Oriented to person, Oriented to place, Oriented to situation, Disoriented to time Following Commands: Follows one step commands with increased time Safety/Judgement: Decreased awareness of safety, Decreased awareness of deficits General Comments: pt able to follow commands given increased time, slow procesing and poor sequencing; translator Tatjana # [id number] used.     Extremity Assessment (includes Sensation/Coordination)  Upper Extremity Assessment: RUE deficits/detail, LUE deficits/detail RUE Deficits / Details: PROM WFL, grossly 3-/5 MMT, decreased coordination and undershoorting  RUE Coordination: decreased fine motor, decreased gross motor LUE Deficits / Details: PROM WFL, grossly 3+/5 MMT, decreased coordination  LUE Coordination: decreased fine motor  Lower Extremity Assessment: Defer to PT evaluation RLE Deficits / Details: Appears to be slightly weaker in right LE grossly 3+/5 LLE Deficits / Details: Grossly 4-/5    ADLs  Overall ADL's : Needs assistance/impaired Eating/Feeding: NPO Grooming: Maximal assistance, Sitting Upper Body Bathing: Maximal assistance, Sitting Lower Body Bathing: Total assistance, +2 for safety/equipment, +2 for physical assistance, Sit to/from stand Upper Body Dressing : Total assistance, Sitting Lower Body Dressing: +2 for physical assistance, Total assistance, +2 for safety/equipment, Sit to/from stand Toilet Transfer: +2 for physical assistance, +2 for safety/equipment, Maximal assistance, Total assistance, Stand-pivot  Toilet Transfer Details (indicate cue type and reason): using stedy Toileting- Clothing Manipulation and Hygiene: Total assistance, +2 for physical assistance, +2 for safety/equipment, Sit to/from stand Functional mobility during ADLs: Maximal assistance, Total assistance, +2 for safety/equipment, +2 for physical assistance General ADL Comments: pt limited by weakness, impaired balance, R lateral lean, cognition    Mobility  Overal bed mobility: Needs Assistance Bed Mobility: Rolling, Sidelying to Sit Rolling: Mod assist, +2 for physical assistance Sidelying to sit: Max assist, +2 for physical assistance Sit to supine: Max assist, +2 for physical assistance General bed mobility comments: Pt assisted needing assist for trunk elevation.  Pt needed assist to move hips to EOB as well. Does not use his UEs as he should to assist with mobility.     Transfers  Overall transfer level: Needs assistance Transfer via Lift Equipment: Stedy Transfers: Sit to/from Stand, Risk manager Sit to Stand: +2 physical assistance, Mod assist, Max assist Stand pivot transfers: Total assist, +2 physical assistance General transfer comment: Pt needed mod assist for initial stand to [facility] with pt not standing all the way up.  Does not use UEs to pull up on STedy nor does he stand upright fully.  Pt also with right lateral lean significant at times needing total assist to sit up.   Moved pt to recliner with pt sitting on STedy needing  total assist as he leans so heavy to right and anterior and even with interpreter would not sit up.  Last stand was max assist due to pt fatigue with right lateral lean.  Pt told interpreter "I can't get my body to do what I want. Will that iimprove?"    Ambulation / Gait / Stairs / Wheelchair Mobility  Ambulation/Gait General Gait Details: pt unable     Posture / Balance Dynamic Sitting Balance Sitting balance - Comments: max to total assist due to pt pushing with left  UE or not even attempting to use UEs.  He did have a brief time sitting EOB when he did get balance for a few seconds after PT working on propping on left elbow and sitting back to midline.   Balance Overall balance assessment: Needs assistance Sitting-balance support: Bilateral upper extremity supported, Feet supported Sitting balance-Leahy Scale: Zero Sitting balance - Comments: max to total assist due to pt pushing with left UE or not even attempting to use UEs.  He did have a brief time sitting EOB when he did get balance for a few seconds after PT working on propping on left elbow and sitting back to midline.   Postural control: Right lateral lean, Posterior lean Standing balance support: Bilateral upper extremity supported, During functional activity Standing balance-Leahy Scale: Zero Standing balance comment: cannot stand fully upright even with total assist of 2.     Special needs/care consideration BiPAP/CPAP : no CPM : no Continuous Drip IV : none Dialysis : Not needed at this time      Life Vest : yes, has been ordered. Note yet arrived at time of admission to CIR. Per Dr. Haroldine Laws, the "patient OK to transfer to CIR prior to LifeVest placement." Oxygen : 2L/min [place] Special Bed : no Trach Size : no Wound Vac (area) : no      Location : no Skin : abrasion to right lower abdomen, ecchymosis to bilateral groin, flank; pressure injury to head on left side (unstageable).                  Bowel mgmt: continent, last BM 05/02/2019 Bladder mgmt: external urinary catheter Diabetic mgmt: has had issues with blood sugar per daughter Behavioral consideration : confusion at times, anoxic BI? Chemo/radiation : no   Previous Home Environment (from acute therapy documentation) Living Arrangements: Spouse/significant other, Children Available Help at Discharge: Family Type of Home: House Home Layout: Two level Alternate Level Stairs-Rails: Right Alternate Level Stairs-Number of Steps: 10  Home Access: Stairs to enter Entrance Stairs-Number of Steps: [place]: No  Discharge Living Setting Plans for Discharge Living Setting: Patient's home, Lives with (comment)(wife) Type of Home at Discharge: House Discharge Home Layout: Two level, 1/2 bath on main level, Able to live on main level with bedroom/bathroom Alternate Level Stairs-Rails: Left Alternate Level Stairs-Number of Steps: 12 Discharge Home Access: Level entry Discharge Bathroom Shower/Tub: Tub/shower unit(full bath upstairs only) Discharge Bathroom Toilet: Standard Discharge Bathroom Accessibility: Yes How Accessible: Accessible via walker Does the patient have any problems obtaining your medications?: No  Social/Family/Support Systems Patient Roles: Spouse, Other (Comment)(full time employee) Contact Information: wife: [name]: [phone]; daugther (is main contact as she speaks English: Aida [phone] Anticipated Caregiver: wife (son and daughter live nearby ) Anticipated Caregiver's Contact Information: see above Ability/Limitations of Caregiver: Min/Mod A Caregiver Availability: 24/7 Discharge Plan Discussed with Primary Caregiver: Yes Is Caregiver In Agreement with Plan?: Yes Does Caregiver/Family have Issues with Lodging/Transportation while Pt is in Rehab?: No   Wife is designated visitor: Magazine features editor  Goals/Additional Needs Patient/Family Goal for Rehab: PT/OT: Min A; SLP: Supervision Expected length of stay: 20-24 days Cultural Considerations: speaks [place] (understands some English) but prefers interpreter (use stratus).  Dietary Needs: DYS 2, thin liquids, room service with assist Equipment Needs: TBD Special Service Needs: Interpreter needs: [place]ian  Pt/Family Agrees to Admission and willing to participate: Yes Program Orientation Provided & Reviewed with Pt/Caregiver Including Roles  & Responsibilities: Yes(wife and pt)  Barriers to Discharge: Home environment  access/layout,  Insurance for SNF coverage, Nutrition means, New oxygen  Barriers to Discharge Comments: bedroom/full bath on 2nd level (can stay on first floor if needed); new O2 needs, Intrepreter needs  Decrease burden of Care through IP rehab admission: NA  Possible need for SNF placement upon discharge: Not anticipated; pt has stated she is able and willing to provide 24/7 A at DC and appears to be very supportive of his rehab efforts. House is level entry for easy accessibility.   Patient Condition: I have reviewed medical records from [facility], spoken with RN, MD, and NP, and patient and spouse. I met with patient at the bedside for inpatient rehabilitation assessment.  Patient will benefit from ongoing PT, OT and SLP, can actively participate in 3 hours of therapy a day 5 days of the week, and can make measurable gains during the admission.  Patient will also benefit from the coordinated team approach during an Inpatient Acute Rehabilitation admission.  The patient will receive intensive therapy as well as Rehabilitation physician, nursing, social worker, and care management interventions.  Due to bladder management, bowel management, safety, skin/wound care, disease management, medication administration, pain management and patient education the patient requires 24 hour a day rehabilitation nursing.  The patient is currently Mod/Max A x2 for transfers with Charlaine Dalton and Max to Total Ax2 for basic ADLs.  Discharge setting and therapy post discharge at home with home health is anticipated.  Patient has agreed to participate in the Acute Inpatient Rehabilitation Program and will admit 05/04/2019.  Preadmission Screen Completed By:  Jhonnie Garner, 05/04/2019 4:49 PM ______________________________________________________________________   Discussed status with Dr. Naaman Plummer on 05/04/2019 at 4:45PM and received approval for admission today.  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 Does the need for close, 24 hr/day Medical supervision in concert with the patient's rehab needs make it unreasonable for this patient to be served in a less intensive setting? Yes 2. Co-Morbidities requiring supervision/potential complications: right caudate infarct, AKI, delirium 3. Due to bladder management, bowel management, safety, skin/wound care, disease management, medication administration, pain management and patient education, does the patient require 24 hr/day rehab nursing? Yes 4. Does the patient require coordinated care of a physician, rehab nurse, PT (1-2 hrs/day, 5 days/week), OT (1-2 hrs/day, 5 days/week) and SLP (1-2 hrs/day, 5 days/week) to address physical and functional deficits in the context of the above medical diagnosis(es)? Yes Addressing deficits in the following areas: balance, endurance, locomotion, strength, transferring, bowel/bladder control, bathing, dressing, feeding, grooming, toileting, cognition, speech, swallowing and psychosocial support 5. Can the patient actively participate in an intensive therapy program of at least 3 hrs of therapy 5 days a week? Yes 6. The potential for patient to make measurable gains while on inpatient rehab is good 7. Anticipated functional outcomes upon discharge from inpatients are: min assist PT, min assist OT, supervision SLP 8. Estimated rehab length of stay to reach the above functional goals is: 20-24 days 9. Anticipated D/C setting: Home 10. Anticipated post D/C treatments: [place] therapy 11. Overall Rehab/Functional Prognosis: good  MD Signature: Lawrence Staggers, MD, [facility] 05/04/2019         Revision History Date/Time User Provider Type Action  05/04/2019 4:57 PM Lawrence Staggers, MD Physician Sign  05/04/2019 4:49 PM Jhonnie Garner, [place] [facility] Admission Coordinator Share  View  Details Report

## 2019-05-04 NOTE — H&P (Signed)
Physical Medicine and Rehabilitation Admission H&P    CC: Anoxic brain injury with debility   HPI: Lawrence Olson is a 68 year old male with no prior medical issues who was admitted on 04/16/19 after witnessed cardiac arrest at work. Question of onlooker CPR-- he was found to have VT and required CPR/ACLS protocol approx 20 minutes with king airway placed by EMS. He as intubated in ED, noted to be severely hypoxic and hypercapneic and found to have ST elevation due to MI. Despite prolonged downtime, some purposeful movement noted and he was taken to cath lab for emergent catheterization. Attempts at opening LAD unsuccessful with development of VT and impella placed with pressors for cardiogenic shock.  Hospital course significant for oozing from left IJ as well as issues with hematuria, fevers treated with empiric antibiotics, ectopy requiring amiodarone as well as need for suture repair of right femoral arterial puncture site as well as L-IJ central line.   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 He was started on IV keppra due to high risk of seizures and EEG done revealing moderate to severe diffuse encephalopathy.   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 Stroke question due to small vessel disease v/s embolic and ASA recommended for secondary stroke prevention.  As mentation started improving without definate seizures, keppra was discontinued on 09/01.  Fluid overload treated with IV diuresis but he developed multifactorial AKI likely due to ATN. Dr. Noel Journey consulted 9/03  and CRRT initiated due to ongoing issues with overload. He continued to have issues with fevers and Vancomycin resumed/meropenum added 9/6. He has had issues with delirium and agitation --LTM EEG negative for seizures and  prolonged encephalopathy felt to be due to ischemic BI. 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 Antibiotics d/c as cultures negative and CRRT discontinued as fluid status improved with good UOP.  Renal status improving. Follow up echo with EF 25-30% and Life Vest to be ordered.  MBS done 9/10 and he was started on dysphagia 2, thins. Therapy ongoing and patient noted to be limited by signifiant debility. CIR recommended due to functional deccline.     Review of Systems  Constitutional: Negative for chills and fever.  HENT: Negative for hearing loss and tinnitus.   Eyes: Negative for blurred vision and double vision.  Respiratory: Negative for cough and shortness of breath.   Cardiovascular: Negative for chest pain, palpitations and leg swelling.  Gastrointestinal: Negative for diarrhea and heartburn.  Genitourinary: Negative for dysuria and urgency.  Musculoskeletal: Negative for joint pain and myalgias.  Skin: Negative for rash.  Neurological: Positive for weakness. Negative for dizziness, sensory change and headaches.  Psychiatric/Behavioral: Positive for memory loss. The patient does not have insomnia.      Past Medical History:  Diagnosis Date  . Jaundice    needed hospitalization in [place]     Past Surgical History:  Procedure Laterality Date  . RIGHT HEART CATH AND CORONARY ANGIOGRAPHY N/A 04/16/2019   Procedure: RIGHT HEART CATH AND CORONARY ANGIOGRAPHY;  Surgeon: Troy Sine, MD;  Location: [facility];  Service: Cardiovascular;  Laterality: N/A;  . VENTRICULAR ASSIST DEVICE INSERTION N/A 04/16/2019   Procedure: VENTRICULAR ASSIST DEVICE INSERTION;  Surgeon: Shelva Majestic  A, MD;  Location: [facility];  Service: Cardiovascular;  Laterality: N/A;    Family History  Problem Relation Age of Onset  . Healthy Brother   . Healthy Sister      Social History:   Married. Independent and was still working PTA. He used to smoke cigarettes -quit 19 years ago. He used to smoke about 1/2 PPD for about 40 years.  He has never used smokeless tobacco. He has not had any alcohol for 4 years.  No history on file for drug.    Allergies: No Known Allergies    Medications Prior to Admission  Medication Sig Dispense Refill  . fluocinolone (SYNALAR) 0.01 % external solution Apply 1 application topically 2 (two) times daily as needed (skin care).     Marland Kitchen ketoconazole (NIZORAL) 2 % shampoo Apply 5 mLs topically daily as needed for irritation.       Drug Regimen Review  Drug regimen was reviewed and remains appropriate with no significant issues identified  Home: Home Living Family/patient expects to be discharged to:: Private residence Living Arrangements: Spouse/significant other, Children Available Help at Discharge: Family Type of Home: House Home Access: Stairs to enter Technical brewer of Steps: 1 Home Layout: Two level Alternate Level Stairs-Number of Steps: 10 Alternate Level Stairs-Rails: Right Home Equipment: None   Functional History: Prior Function Level of Independence: Independent  Functional Status:  Mobility: Bed Mobility Overal bed mobility: Needs Assistance Bed Mobility: Rolling, Sidelying to Sit Rolling: Mod assist, +2 for physical assistance Sidelying to sit: Max assist, +2 for physical assistance Sit to supine: Max assist, +2 for physical assistance General bed mobility comments: Pt assisted needing assist for trunk elevation.  Pt needed assist to move hips to EOB as well. Does not use his UEs as he should to assist with mobility.  Transfers Overall transfer level: Needs assistance Transfer via Lift Equipment: Stedy Transfers: Sit to/from Stand, Risk manager Sit to Stand: +2 physical assistance, Mod assist, Max assist Stand pivot transfers: Total assist, +2 physical assistance General transfer comment: Pt needed  mod assist for initial stand to [facility] with pt not standing all the way up.  Does not use UEs to pull up on STedy nor does he stand upright fully.  Pt also with right lateral lean significant at times needing total assist to sit up.   Moved pt to recliner with pt sitting on STedy needing  total assist as he leans so heavy to right and anterior and even with interpreter would not sit up.  Last stand was max assist due to pt fatigue with right lateral lean.  Pt told interpreter "I can't get my body to do what I want. Will that iimprove?" Ambulation/Gait General Gait Details: pt unable     ADL: ADL Overall ADL's : Needs assistance/impaired Eating/Feeding: NPO Grooming: Maximal assistance, Sitting Upper Body Bathing: Maximal assistance, Sitting Lower Body Bathing: Total assistance, +2 for safety/equipment, +2 for physical assistance, Sit to/from stand Upper Body Dressing : Total assistance, Sitting Lower Body Dressing: +2 for physical assistance, Total assistance, +2 for safety/equipment, Sit to/from stand Toilet Transfer: +2 for physical assistance, +2 for safety/equipment, Maximal assistance, Total assistance, Stand-pivot Toilet Transfer Details (indicate cue type and reason): using stedy Toileting- Clothing Manipulation and Hygiene: Total assistance, +2 for physical assistance, +2 for safety/equipment, Sit to/from stand Functional mobility during ADLs: Maximal assistance, Total assistance, +2 for safety/equipment, +2 for physical assistance General ADL Comments: pt limited by weakness, impaired balance, R lateral lean,  cognition  Cognition: Cognition Overall Cognitive Status: Impaired/Different from baseline Orientation Level: Oriented to person, Oriented to place, Oriented to situation, Disoriented to time Cognition Arousal/Alertness: Awake/alert Behavior During Therapy: Restless Overall Cognitive Status: Impaired/Different from baseline Area of Impairment: Orientation, Memory, Following  commands, Safety/judgement, Awareness, Problem solving, Attention Orientation Level: Disoriented to, Time(did not ask situation) Current Attention Level: Focused Memory: Decreased short-term memory, Decreased recall of precautions Following Commands: Follows one step commands with increased time Safety/Judgement: Decreased awareness of safety, Decreased awareness of deficits Awareness: Emergent Problem Solving: Difficulty sequencing, Requires verbal cues, Requires tactile cues, Decreased initiation, Slow processing General Comments: pt able to follow commands given increased time, slow procesing and poor sequencing; translator Tatjana # [id number] used.  Difficult to assess due to: Intubated, Non-English speaking    Blood pressure 128/71, pulse 83, temperature 98.3 F (36.8 C), temperature source Oral, resp. rate 14, height _0  (1.676 m), weight 82.3 kg, SpO2 97 %. Physical Exam  Nursing note and vitals reviewed. Constitutional: He appears well-developed and well-nourished.  Cardiovascular: Normal rate.  Respiratory: Effort normal.  Musculoskeletal:        General: No edema.  Neurological: He is alert.  Oriented to self and initially thought that he was at home. Occasional rambling with confused speech. Slow to process with question component of anxiety. He is able  to follow simple motor commands.   Skin:  Diffuse ecchymosis bilateral groin and with indurated area right groin.      Results for orders placed or performed during the hospital encounter of 04/16/19 (from the past 48 hour(s))  Glucose, capillary     Status: Abnormal   Collection Time: 05/02/19  3:43 PM  Result Value Ref Range   Glucose-Capillary 188 (H) 70 - 99 mg/dL  Glucose, capillary     Status: Abnormal   Collection Time: 05/02/19  7:45 PM  Result Value Ref Range   Glucose-Capillary 150 (H) 70 - 99 mg/dL  Glucose, capillary     Status: Abnormal   Collection Time: 05/02/19 11:14 PM  Result Value Ref Range    Glucose-Capillary 156 (H) 70 - 99 mg/dL  Glucose, capillary     Status: Abnormal   Collection Time: 05/03/19  3:35 AM  Result Value Ref Range   Glucose-Capillary 127 (H) 70 - 99 mg/dL  Renal function panel (daily at 0500)     Status: Abnormal   Collection Time: 05/03/19  4:40 AM  Result Value Ref Range   Sodium 143 135 - 145 mmol/L   Potassium 4.3 3.5 - 5.1 mmol/L   Chloride 108 98 - 111 mmol/L   CO2 23 22 - 32 mmol/L   Glucose, Bld 149 (H) 70 - 99 mg/dL   BUN 111 (H) 8 - 23 mg/dL   Creatinine, Ser 3.20 (H) 0.61 - 1.24 mg/dL   Calcium 8.3 (L) 8.9 - 10.3 mg/dL   Phosphorus 7.9 (H) 2.5 - 4.6 mg/dL   Albumin 2.2 (L) 3.5 - 5.0 g/dL   GFR calc non Af Amer 19 (L) >60 mL/min   GFR calc Af Amer 22 (L) >60 mL/min   Anion gap 12 5 - 15    Comment: Performed at [facility], [address], [place], [place] [address]  .Cooxemetry Panel (carboxy, met, total hgb, O2 sat)     Status: Abnormal   Collection Time: 05/03/19  4:46 AM  Result Value Ref Range   Total hemoglobin 8.3 (L) 12.0 - 16.0 g/dL   O2 Saturation 72.2 %  Carboxyhemoglobin 1.5 0.5 - 1.5 %   Methemoglobin 0.9 0.0 - 1.5 %  Glucose, capillary     Status: Abnormal   Collection Time: 05/03/19  7:49 AM  Result Value Ref Range   Glucose-Capillary 117 (H) 70 - 99 mg/dL  Glucose, capillary     Status: Abnormal   Collection Time: 05/03/19 12:38 PM  Result Value Ref Range   Glucose-Capillary 249 (H) 70 - 99 mg/dL  Glucose, capillary     Status: Abnormal   Collection Time: 05/03/19  4:17 PM  Result Value Ref Range   Glucose-Capillary 180 (H) 70 - 99 mg/dL  Glucose, capillary     Status: Abnormal   Collection Time: 05/03/19  7:39 PM  Result Value Ref Range   Glucose-Capillary 228 (H) 70 - 99 mg/dL  Glucose, capillary     Status: Abnormal   Collection Time: 05/03/19 11:38 PM  Result Value Ref Range   Glucose-Capillary 157 (H) 70 - 99 mg/dL  Glucose, capillary     Status: Abnormal   Collection Time: 05/04/19 12:51 AM   Result Value Ref Range   Glucose-Capillary 125 (H) 70 - 99 mg/dL   Comment 1 Notify RN    Comment 2 Document in Chart   Glucose, capillary     Status: None   Collection Time: 05/04/19  5:17 AM  Result Value Ref Range   Glucose-Capillary 86 70 - 99 mg/dL   Comment 1 Notify RN    Comment 2 Document in Chart   Renal function panel (daily at 0500)     Status: Abnormal   Collection Time: 05/04/19  5:21 AM  Result Value Ref Range   Sodium 144 135 - 145 mmol/L   Potassium 4.2 3.5 - 5.1 mmol/L   Chloride 110 98 - 111 mmol/L   CO2 24 22 - 32 mmol/L   Glucose, Bld 86 70 - 99 mg/dL   BUN 110 (H) 8 - 23 mg/dL   Creatinine, Ser 3.10 (H) 0.61 - 1.24 mg/dL   Calcium 8.4 (L) 8.9 - 10.3 mg/dL   Phosphorus 6.1 (H) 2.5 - 4.6 mg/dL   Albumin 2.3 (L) 3.5 - 5.0 g/dL   GFR calc non Af Amer 20 (L) >60 mL/min   GFR calc Af Amer 23 (L) >60 mL/min   Anion gap 10 5 - 15    Comment: Performed at [facility], [address], [place], [place] [address]  CBC     Status: Abnormal   Collection Time: 05/04/19  5:21 AM  Result Value Ref Range   WBC 23.8 (H) 4.0 - 10.5 K/uL   RBC 2.74 (L) 4.22 - 5.81 MIL/uL   Hemoglobin 8.5 (L) 13.0 - 17.0 g/dL   HCT 26.5 (L) 39.0 - 52.0 %   MCV 96.7 80.0 - 100.0 fL   MCH 31.0 26.0 - 34.0 pg   MCHC 32.1 30.0 - 36.0 g/dL   RDW 16.0 (H) 11.5 - 15.5 %   Platelets 896 (H) 150 - 400 K/uL    Comment: REPEATED TO VERIFY   nRBC 0.0 0.0 - 0.2 %    Comment: Performed at [facility], [place] [address] [place], [place] [address]  .Cooxemetry Panel (carboxy, met, total hgb, O2 sat)     Status: Abnormal   Collection Time: 05/04/19  5:50 AM  Result Value Ref Range   Total hemoglobin 8.8 (L) 12.0 - 16.0 g/dL   O2 Saturation 67.3 %   Carboxyhemoglobin 1.7 (H) 0.5 - 1.5 %  Methemoglobin 1.0 0.0 - 1.5 %  Glucose, capillary     Status: Abnormal   Collection Time: 05/04/19  8:09 AM  Result Value Ref Range   Glucose-Capillary 136 (H) 70 - 99 mg/dL   Comment 1 Notify RN     Comment 2 Document in Chart   Glucose, capillary     Status: Abnormal   Collection Time: 05/04/19 11:52 AM  Result Value Ref Range   Glucose-Capillary 137 (H) 70 - 99 mg/dL   Comment 1 Notify RN    Comment 2 Document in Chart    Dg Swallowing Func-speech Pathology  Result Date: 05/03/2019 Objective Swallowing Evaluation: Type of Study: MBS-Modified Barium Swallow Study  Patient Details Name: Delvin Hedeen [name] [id number] Date of Birth: Aug 25, 1950 Today's Date: 05/03/2019 Time: SLP Start Time (ACUTE ONLY): 8250 -SLP Stop Time (ACUTE ONLY): 0950 SLP Time Calculation (min) (ACUTE ONLY): 15 min Past Medical History: No past medical history on file. Past Surgical History: Past Surgical History: Procedure Laterality Date . RIGHT HEART CATH AND CORONARY ANGIOGRAPHY N/A 04/16/2019  Procedure: RIGHT HEART CATH AND CORONARY ANGIOGRAPHY;  Surgeon: Troy Sine, MD;  Location: [facility];  Service: Cardiovascular;  Laterality: N/A; . VENTRICULAR ASSIST DEVICE INSERTION N/A 04/16/2019  Procedure: VENTRICULAR ASSIST DEVICE INSERTION;  Surgeon: Troy Sine, MD;  Location: [facility];  Service: Cardiovascular;  Laterality: N/A; HPI: 68 yo M out of hospital cardiac arrest with ROSC, intubated 8/24-9/8, found to have STEMI, taken emergently to cath lab. Downtime ~20 minutes with anoxic brain injury and significant encephalopathy.  Demonstrating posturing initially, then responding some to family when speaking [place].  No known medical hx,  Subjective: Pt was encountered awake/alert and he was agreeable to MBS. Assessment / Plan / Recommendation CHL IP CLINICAL IMPRESSIONS 05/03/2019 Clinical Impression Amato Rhem is a 68 year old male who presents with mild oropharyngeal dysphagia with resultant shallow, transient laryngeal penetration of thin liquid and moderate-severe vallecular residue with solids on today's exam.   Pt consumed trials of thin liquid, puree, regular solids, and a barium pill whole in  puree.  Pt exhibited prolonged mastication of regular solids and reduced lingual strength, resulting in trace-mild oral residue with puree and solid trials.  Pharyngeal phase was remarkable for reduced pharyngeal constriction, resulting in moderate-severe vallecular residue with solids.  Pt additionally presented with reduced anterior movement of the hyolaryngeal segment and mildly reduced BOT retraction, which may also have contributed to the vallecular residue.  During barium pill trial, pt was unable to propel the bolus posteriorly, and he subsequently expelled it from his oral cavity.  Recommend Dysphagia 2 (fine chop) solids and thin liquid with use of aspiration precautions and medications administered crushed or cut in half in puree.  Suspect that pt will be able to tolerate increasingly complex solids given time and improved strength.  SLP Visit Diagnosis Dysphagia, oropharyngeal phase (R13.12) Attention and concentration deficit following -- Frontal lobe and executive function deficit following -- Impact on safety and function Mild aspiration risk   CHL IP TREATMENT RECOMMENDATION 05/02/2019 Treatment Recommendations Defer until completion of intrumental exam   Prognosis 05/03/2019 Prognosis for Safe Diet Advancement Good Barriers to Reach Goals -- Barriers/Prognosis Comment -- CHL IP DIET RECOMMENDATION 05/03/2019 SLP Diet Recommendations Dysphagia 2 (Fine chop) solids;Thin liquid Liquid Administration via Straw;Cup Medication Administration Other (Comment) Compensations Slow rate;Small sips/bites;Follow solids with liquid Postural Changes Seated upright at 90 degrees   CHL IP OTHER RECOMMENDATIONS 05/03/2019 Recommended Consults -- Oral  Care Recommendations Oral care BID Other Recommendations --   CHL IP FOLLOW UP RECOMMENDATIONS 05/02/2019 Follow up Recommendations Inpatient Rehab   CHL IP FREQUENCY AND DURATION 05/03/2019 Speech Therapy Frequency (ACUTE ONLY) min 2x/week Treatment Duration 2 weeks      CHL IP  ORAL PHASE 05/03/2019 Oral Phase Impaired Oral - Pudding Teaspoon -- Oral - Pudding Cup -- Oral - Honey Teaspoon -- Oral - Honey Cup -- Oral - Nectar Teaspoon -- Oral - Nectar Cup -- Oral - Nectar Straw -- Oral - Thin Teaspoon Premature spillage Oral - Thin Cup Premature spillage;Piecemeal swallowing Oral - Thin Straw Premature spillage Oral - Puree Delayed oral transit Oral - Mech Soft -- Oral - Regular Piecemeal swallowing;Delayed oral transit;Impaired mastication;Decreased bolus cohesion Oral - Multi-Consistency -- Oral - Pill Weak lingual manipulation;Other (Comment) Oral Phase - Comment --  CHL IP PHARYNGEAL PHASE 05/03/2019 Pharyngeal Phase Impaired Pharyngeal- Pudding Teaspoon -- Pharyngeal -- Pharyngeal- Pudding Cup -- Pharyngeal -- Pharyngeal- Honey Teaspoon -- Pharyngeal -- Pharyngeal- Honey Cup -- Pharyngeal -- Pharyngeal- Nectar Teaspoon -- Pharyngeal -- Pharyngeal- Nectar Cup -- Pharyngeal -- Pharyngeal- Nectar Straw -- Pharyngeal -- Pharyngeal- Thin Teaspoon Delayed swallow initiation-pyriform sinuses Pharyngeal Material does not enter airway Pharyngeal- Thin Cup Delayed swallow initiation-pyriform sinuses;Reduced anterior laryngeal mobility;Reduced airway/laryngeal closure;Penetration/Aspiration before swallow;Pharyngeal residue - valleculae;Pharyngeal residue - pyriform Pharyngeal Material enters airway, remains ABOVE vocal cords then ejected out Pharyngeal- Thin Straw Delayed swallow initiation-pyriform sinuses;Reduced anterior laryngeal mobility;Reduced airway/laryngeal closure;Penetration/Aspiration before swallow;Pharyngeal residue - valleculae;Pharyngeal residue - pyriform Pharyngeal Material enters airway, remains ABOVE vocal cords then ejected out Pharyngeal- Puree Delayed swallow initiation-vallecula;Reduced anterior laryngeal mobility;Reduced tongue base retraction;Pharyngeal residue - valleculae;Reduced pharyngeal peristalsis Pharyngeal Material does not enter airway Pharyngeal- Mechanical  Soft -- Pharyngeal -- Pharyngeal- Regular Delayed swallow initiation-vallecula;Reduced anterior laryngeal mobility;Reduced tongue base retraction;Pharyngeal residue - valleculae;Reduced pharyngeal peristalsis Pharyngeal Material does not enter airway Pharyngeal- Multi-consistency -- Pharyngeal -- Pharyngeal- Pill -- Pharyngeal -- Pharyngeal Comment --  No flowsheet data found. Elvia Collum [facility] 05/03/2019, 1:37 PM  Bretta Bang, M.S., [facility] Office: [phone]                 Medical Problem List and Plan: 1.  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  Antithrombotics: -DVT/anticoagulation:  Pharmaceutical: Heparin  -antiplatelet therapy: ASA 3. Pain Management: N/A 4. Mood: LCSW to follow for evaluation and support.  Does not have full awareness of deficits.  -antipsychotic agents: On Seroquel bid 5. Neuropsych: This patient is not capable of making decisions on his own behalf. 6. Skin/Wound Care: Routine pressure relief measures 7. Fluids/Electrolytes/Nutrition: strict I/O. Check lytes in am.  8.  CAD with STEMI/VT arrest: Treated medically for now.   -LifeVest ordered and to be fitted tonight or tomorrow morning per cardiology. Cardiology is aware that there is NOT continuous monitoring on inpatient rehab as we await lifevest.  -continue Coreg twice daily and Lipitor 9.  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  T2DM: Hemoglobin A1c seven-point 11.  Acute renal failure due to ischemic ATN: Urine output improving. Cr around 3.1  -nephrology following  -daily labs 12. Urine retention:  --monitor bladder emptying with PVR checks.    On Flomax for urinary retention?  13.  Acute blood loss anemia: Recheck in a.m. H&H stable 14.  Combined heart failure/ischemic cardiomyopathy: Monitor for signs of overload and check weights daily.  On Coreg twice daily.  No ACE/arms or  BB due to  AKI and shock 15.  Leukocytosis/Thrombocytosis: Afebrile off antibiotics.  Improving and felt to be reactive/secondary to steroids.  Monitor for signs of infection.      Bary Leriche, PA-C 05/04/2019

## 2019-05-04 NOTE — Progress Notes (Signed)
Patient ID: Lawrence Olson, male   DOB: 1951-07-28, 68 y.o.   [name] [id number]   Advanced Heart Failure Team  Note   Primary Physician: Patient, No Pcp Per PCP-Cardiologist:  No primary care provider on file.  HPI:    Continues to progress. Seen by speech on Dysphagia 2 (Fine chop) solids;Thin liquid  Denies CP or SOB. No orthopnea or PND. Seen by PT and recommending SNF.   Able to converse and follow commands but said it was November 2015  Echo 04/24/19 EF 25-30% no LV thrombus   Objective:    Vital Signs:   Temp:  [97.7 F (36.5 C)-98.6 F (37 C)] 98.6 F (37 C) (09/11 0509) Pulse Rate:  [71-88] 80 (09/11 0055) Resp:  [9-13] 13 (09/11 0055) BP: (117-138)/(69-80) 127/69 (09/11 0055) SpO2:  [91 %-96 %] 94 % (09/11 0055) Weight:  [82.3 kg] 82.3 kg (09/11 0055) Last BM Date: 05/01/19  Weight change: Filed Weights   05/02/19 0500 05/03/19 0452 05/04/19 0055  Weight: 84.7 kg 83.1 kg 82.3 kg    Intake/Output:   Intake/Output Summary (Last 24 hours) at 05/04/2019 0854 Last data filed at 05/04/2019 0849 Gross per 24 hour  Intake 2270 ml  Output 2550 ml  Net -280 ml      Physical Exam   General:  Weak appearing. No resp difficulty HEENT: normal Neck: supple. RIJ trialysis  LIJ TLCCarotids 2+ bilat; no bruits. No lymphadenopathy or thryomegaly appreciated. Cor: PMI nondisplaced. Regular rate & rhythm. No rubs, gallops or murmurs. Lungs: clear Abdomen: soft, nontender, nondistended. No hepatosplenomegaly. No bruits or masses. Good bowel sounds. Extremities: no cyanosis, clubbing, rash, edema Neuro: alert & orientedx3, cranial nerves grossly intact. moves all 4 extremities w/o difficulty. Affect pleasant  Telemetry   NSR 80s Personally reviewed  Labs   Basic Metabolic Panel: Recent Labs  Lab 04/28/19 0319  04/29/19 0327  04/30/19 0258 04/30/19 1600 05/01/19 0320 05/01/19 4967 05/02/19 0421 05/03/19 0440 05/04/19 0521  NA 139   < > 135   < > 136 136 134* 138  137 143 144  K 4.0   < > 4.6   < > 5.7* 4.4 4.1 4.3 3.9 4.3 4.2  CL 104   < > 101   < > 101 102 101  --  102 108 110  CO2 26   < > 24   < > '24 25 25  ' --  '24 23 24  ' GLUCOSE 163*   < > 163*   < > 284* 133* 172*  --  157* 149* 86  BUN 51*   < > 44*   < > 48* 48* 49*  --  87* 111* 110*  CREATININE 1.86*   < > 1.90*   < > 2.00* 1.83* 1.82*  --  3.02* 3.20* 3.10*  CALCIUM 7.8*   < > 8.0*   < > 7.9* 8.0* 7.6*  --  8.1* 8.3* 8.4*  MG 2.8*  --  2.8*  --  3.0*  --  2.9*  --   --   --   --   PHOS 4.1   < > 4.5   < > 5.8* 4.6 4.7*  --  6.4* 7.9* 6.1*   < > = values in this interval not displayed.    Liver Function Tests: Recent Labs  Lab 04/30/19 1600 05/01/19 0320 05/02/19 0421 05/03/19 0440 05/04/19 0521  ALBUMIN 2.2* 1.9* 2.2* 2.2* 2.3*   No results for input(s): LIPASE, AMYLASE in the last 168  hours. No results for input(s): AMMONIA in the last 168 hours.  CBC: Recent Labs  Lab 04/29/19 0327  04/30/19 0258 05/01/19 0320 05/01/19 8850 05/02/19 0421 05/04/19 0521  WBC 30.7*  --  27.4* 34.5*  --  33.0* 23.8*  HGB 8.8*   < > 8.5* 8.1* 9.2* 8.4* 8.5*  HCT 28.5*   < > 27.7* 25.9* 27.0* 26.8* 26.5*  MCV 99.3  --  98.9 99.6  --  98.5 96.7  PLT 630*  --  667* 699*  --  892* 896*   < > = values in this interval not displayed.    Cardiac Enzymes: No results for input(s): CKTOTAL, CKMB, CKMBINDEX, TROPONINI in the last 168 hours.  BNP: BNP (last 3 results) No results for input(s): BNP in the last 8760 hours.  ProBNP (last 3 results) No results for input(s): PROBNP in the last 8760 hours.   CBG: Recent Labs  Lab 05/03/19 1939 05/03/19 2338 05/04/19 0051 05/04/19 0517 05/04/19 0809  GLUCAP 228* 157* 125* 86 136*    Coagulation Studies: No results for input(s): LABPROT, INR in the last 72 hours.   Imaging   Dg Swallowing Func-speech Pathology  Result Date: 05/03/2019 Objective Swallowing Evaluation: Type of Study: MBS-Modified Barium Swallow Study  Patient Details  Name: Lawrence Olson [name] [id number] Date of Birth: 03-Dec-1950 Today's Date: 05/03/2019 Time: SLP Start Time (ACUTE ONLY): 0935 -SLP Stop Time (ACUTE ONLY): 0950 SLP Time Calculation (min) (ACUTE ONLY): 15 min Past Medical History: No past medical history on file. Past Surgical History: Past Surgical History: Procedure Laterality Date . RIGHT HEART CATH AND CORONARY ANGIOGRAPHY N/A 04/16/2019  Procedure: RIGHT HEART CATH AND CORONARY ANGIOGRAPHY;  Surgeon: Troy Sine, MD;  Location: [facility];  Service: Cardiovascular;  Laterality: N/A; . VENTRICULAR ASSIST DEVICE INSERTION N/A 04/16/2019  Procedure: VENTRICULAR ASSIST DEVICE INSERTION;  Surgeon: Troy Sine, MD;  Location: [facility];  Service: Cardiovascular;  Laterality: N/A; HPI: 68 yo M out of hospital cardiac arrest with ROSC, intubated 8/24-9/8, found to have STEMI, taken emergently to cath lab. Downtime ~20 minutes with anoxic brain injury and significant encephalopathy.  Demonstrating posturing initially, then responding some to family when speaking [place].  No known medical hx,  Subjective: Pt was encountered awake/alert and he was agreeable to MBS. Assessment / Plan / Recommendation CHL IP CLINICAL IMPRESSIONS 05/03/2019 Clinical Impression Lawrence Olson is a 68 year old male who presents with mild oropharyngeal dysphagia with resultant shallow, transient laryngeal penetration of thin liquid and moderate-severe vallecular residue with solids on today's exam.   Pt consumed trials of thin liquid, puree, regular solids, and a barium pill whole in puree.  Pt exhibited prolonged mastication of regular solids and reduced lingual strength, resulting in trace-mild oral residue with puree and solid trials.  Pharyngeal phase was remarkable for reduced pharyngeal constriction, resulting in moderate-severe vallecular residue with solids.  Pt additionally presented with reduced anterior movement of the hyolaryngeal segment and mildly reduced BOT  retraction, which may also have contributed to the vallecular residue.  During barium pill trial, pt was unable to propel the bolus posteriorly, and he subsequently expelled it from his oral cavity.  Recommend Dysphagia 2 (fine chop) solids and thin liquid with use of aspiration precautions and medications administered crushed or cut in half in puree.  Suspect that pt will be able to tolerate increasingly complex solids given time and improved strength.  SLP Visit Diagnosis Dysphagia, oropharyngeal phase (R13.12) Attention and concentration  deficit following -- Frontal lobe and executive function deficit following -- Impact on safety and function Mild aspiration risk   CHL IP TREATMENT RECOMMENDATION 05/02/2019 Treatment Recommendations Defer until completion of intrumental exam   Prognosis 05/03/2019 Prognosis for Safe Diet Advancement Good Barriers to Reach Goals -- Barriers/Prognosis Comment -- CHL IP DIET RECOMMENDATION 05/03/2019 SLP Diet Recommendations Dysphagia 2 (Fine chop) solids;Thin liquid Liquid Administration via Straw;Cup Medication Administration Other (Comment) Compensations Slow rate;Small sips/bites;Follow solids with liquid Postural Changes Seated upright at 90 degrees   CHL IP OTHER RECOMMENDATIONS 05/03/2019 Recommended Consults -- Oral Care Recommendations Oral care BID Other Recommendations --   CHL IP FOLLOW UP RECOMMENDATIONS 05/02/2019 Follow up Recommendations Inpatient Rehab   CHL IP FREQUENCY AND DURATION 05/03/2019 Speech Therapy Frequency (ACUTE ONLY) min 2x/week Treatment Duration 2 weeks      CHL IP ORAL PHASE 05/03/2019 Oral Phase Impaired Oral - Pudding Teaspoon -- Oral - Pudding Cup -- Oral - Honey Teaspoon -- Oral - Honey Cup -- Oral - Nectar Teaspoon -- Oral - Nectar Cup -- Oral - Nectar Straw -- Oral - Thin Teaspoon Premature spillage Oral - Thin Cup Premature spillage;Piecemeal swallowing Oral - Thin Straw Premature spillage Oral - Puree Delayed oral transit Oral - Mech Soft -- Oral -  Regular Piecemeal swallowing;Delayed oral transit;Impaired mastication;Decreased bolus cohesion Oral - Multi-Consistency -- Oral - Pill Weak lingual manipulation;Other (Comment) Oral Phase - Comment --  CHL IP PHARYNGEAL PHASE 05/03/2019 Pharyngeal Phase Impaired Pharyngeal- Pudding Teaspoon -- Pharyngeal -- Pharyngeal- Pudding Cup -- Pharyngeal -- Pharyngeal- Honey Teaspoon -- Pharyngeal -- Pharyngeal- Honey Cup -- Pharyngeal -- Pharyngeal- Nectar Teaspoon -- Pharyngeal -- Pharyngeal- Nectar Cup -- Pharyngeal -- Pharyngeal- Nectar Straw -- Pharyngeal -- Pharyngeal- Thin Teaspoon Delayed swallow initiation-pyriform sinuses Pharyngeal Material does not enter airway Pharyngeal- Thin Cup Delayed swallow initiation-pyriform sinuses;Reduced anterior laryngeal mobility;Reduced airway/laryngeal closure;Penetration/Aspiration before swallow;Pharyngeal residue - valleculae;Pharyngeal residue - pyriform Pharyngeal Material enters airway, remains ABOVE vocal cords then ejected out Pharyngeal- Thin Straw Delayed swallow initiation-pyriform sinuses;Reduced anterior laryngeal mobility;Reduced airway/laryngeal closure;Penetration/Aspiration before swallow;Pharyngeal residue - valleculae;Pharyngeal residue - pyriform Pharyngeal Material enters airway, remains ABOVE vocal cords then ejected out Pharyngeal- Puree Delayed swallow initiation-vallecula;Reduced anterior laryngeal mobility;Reduced tongue base retraction;Pharyngeal residue - valleculae;Reduced pharyngeal peristalsis Pharyngeal Material does not enter airway Pharyngeal- Mechanical Soft -- Pharyngeal -- Pharyngeal- Regular Delayed swallow initiation-vallecula;Reduced anterior laryngeal mobility;Reduced tongue base retraction;Pharyngeal residue - valleculae;Reduced pharyngeal peristalsis Pharyngeal Material does not enter airway Pharyngeal- Multi-consistency -- Pharyngeal -- Pharyngeal- Pill -- Pharyngeal -- Pharyngeal Comment --  No flowsheet data found. Elvia Collum [place]  05/03/2019, 1:37 PM  Bretta Bang, M.S., [place] [facility] Office: [phone]               Medications:     Current Medications: .  stroke: mapping our early stages of recovery book   Does not apply Once  . aspirin  81 mg Oral Daily  . atorvastatin  80 mg Oral q1800  . B-complex with vitamin C  1 tablet Oral Daily  . carvedilol  3.125 mg Oral BID WC  . chlorhexidine  15 mL Mouth Rinse BID  . Chlorhexidine Gluconate Cloth  6 each Topical Daily  . famotidine  20 mg Oral Daily  . feeding supplement (ENSURE ENLIVE)  237 mL Oral BID BM  . heparin injection (subcutaneous)  5,000 Units Subcutaneous Q8H  . insulin aspart  3-9 Units Subcutaneous Q4H  . insulin detemir  5 Units Subcutaneous Q12H  . mouth rinse  15 mL Mouth Rinse q12n4p  . predniSONE  40 mg Oral Q breakfast  . QUEtiapine  100 mg Oral BID  . sodium chloride flush  10 mL Intravenous Q12H  . sodium chloride flush  10-40 mL Intracatheter Q12H  . tamsulosin  0.4 mg Oral QPC supper    Infusions: . sodium chloride Stopped (05/02/19 0033)    Assessment/Plan   1. CAD: Anterolateral STEMI, cath 8/24 showed occluded proximal LAD, unable to open.  Once we see how he recovers neurologically, will need to decide on feasibility of re-attempt PCI versus CABG. Medical therapy for the forseeable future. - No s/s schemia - Continue medical therapy with ASA + statin.  - Continue low-dose carvedilol    2. Cardiogenic shock: Ischemic cardiomyopathy.   - Echo with EF 25%, normal RV.  Impella removed 8/28.  Milrinone stopped 9/6.  - Co-ox 71  - No ACE/ARB?ARNI or b-blocker yet with AKI and shock - Volume status ok. Renal function seems to be recovering. Hopefully will not need further HD - [place] low-dose carvedilol - Will add hyrdal nitrates over next few days but do not want to drop BP too much in setting of recovering kidney function   3. Acute hypoxic respiratory failure - Extubated 9/8. .  - Suspect  combination of HF and possible aspiration  - Steroids started 9/6 for possible pneumonitis  (ESR 128/persistent diffuse lung infiltrates despite 20 pound volume removal). Amio stopped several days ago. - Stable today  - Would wean steroids over a week as I do feel he had a component of ARDS/pneumonitis.   4. AKI:  -Suspect hemodynamic (ATN)   - CVVHD started 9/3 - Volume status ok. Creatinine improving.  - Renal function seems to be recovering. Hopefully will not need further HD   5. ID - off abx. No fevers. cx negative  5. Cardiac arrest/VF: 20 minutes downtime, concerned for anoxic brain injury.  Neurology following, per yesterday's note he seems to have intact brainstem function but minimal cortical function. EEG suggestive of diffuse encephalopathy. CT head without definite evidence for anoxic encephalopathy.  MRI without convincing evidence of anoxic injury - Mental status much improved. Stil lwith mild anoxic encepholpathy but seems to be improving rapidly.  - Neuro input appreciated - Continue PT/OT/Speech (thanks!) - Failed swallow study. Possible MBS today - CIR consulted - felt to be excellent candidate - D/w EP. Will order LifeVest  6. DVT prophylaxis: - enox  Ok for CIR today.   Glori Bickers MD 05/04/2019 8:54 AM

## 2019-05-04 NOTE — Progress Notes (Addendum)
[facility]    NEPHROLOGY PROGRESS NOTE  SUBJECTIVE: Patient seen and examined earlier this morning.  Resting comfortably without acute complaints.     OBJECTIVE:  Vitals:   05/04/19 1100 05/04/19 1600  BP: 128/71 127/75  Pulse: 83 80  Resp: 14 17  Temp: 98.3 F (36.8 C) 98 F (36.7 C)  SpO2: 97% 94%    Intake/Output Summary (Last 24 hours) at 05/04/2019 1736 Last data filed at 05/04/2019 1500 Gross per 24 hour  Intake 1410 ml  Output 2900 ml  Net -1490 ml      General: resting, NAD HEENT: MMM [place] AT anicteric sclera Neck:  No JVD, no adenopathy CV:  Heart RRR  Lungs:  L/S CTA bilaterally Abd:  abd SNT/ND with normal BS GU:  Bladder non-palpable Extremities:  Trace bilateral LE edema. Skin:  No skin rash  MEDICATIONS:  .  stroke: mapping our early stages of recovery book   Does not apply Once  . aspirin  81 mg Oral Daily  . atorvastatin  80 mg Oral q1800  . B-complex with vitamin C  1 tablet Oral Daily  . carvedilol  3.125 mg Oral BID WC  . chlorhexidine  15 mL Mouth Rinse BID  . Chlorhexidine Gluconate Cloth  6 each Topical Daily  . famotidine  20 mg Oral Daily  . feeding supplement (ENSURE ENLIVE)  237 mL Oral BID BM  . heparin injection (subcutaneous)  5,000 Units Subcutaneous Q8H  . [START ON 05/05/2019] insulin aspart  0-15 Units Subcutaneous TID WC  . insulin detemir  5 Units Subcutaneous Q12H  . mouth rinse  15 mL Mouth Rinse q12n4p  . predniSONE  40 mg Oral Q breakfast  . QUEtiapine  50 mg Oral BID  . sodium chloride flush  10 mL Intravenous Q12H  . sodium chloride flush  10-40 mL Intracatheter Q12H  . tamsulosin  0.4 mg Oral QPC supper       LABS:   CBC Latest Ref Rng & Units 05/04/2019 05/02/2019 05/01/2019  WBC 4.0 - 10.5 K/uL 23.8(H) 33.0(H) -  Hemoglobin 13.0 - 17.0 g/dL 8.5(L) 8.4(L) 9.2(L)  Hematocrit 39.0 - 52.0 % 26.5(L) 26.8(L) 27.0(L)  Platelets 150 - 400 K/uL 896(H) 892(H) -    CMP Latest Ref Rng & Units 05/04/2019  05/03/2019 05/02/2019  Glucose 70 - 99 mg/dL 86 149(H) 157(H)  BUN 8 - 23 mg/dL 110(H) 111(H) 87(H)  Creatinine 0.61 - 1.24 mg/dL 3.10(H) 3.20(H) 3.02(H)  Sodium 135 - 145 mmol/L 144 143 137  Potassium 3.5 - 5.1 mmol/L 4.2 4.3 3.9  Chloride 98 - 111 mmol/L 110 108 102  CO2 22 - 32 mmol/L 24 23 24   Calcium 8.9 - 10.3 mg/dL 8.4(L) 8.3(L) 8.1(L)  Total Protein 6.5 - 8.1 g/dL - - -  Total Bilirubin 0.3 - 1.2 mg/dL - - -  Alkaline Phos 38 - 126 U/L - - -  AST 15 - 41 U/L - - -  ALT 0 - 44 U/L - - -    Lab Results  Component Value Date   CALCIUM 8.4 (L) 05/04/2019   CAION 1.12 (L) 05/01/2019   PHOS 6.1 (H) 05/04/2019       Component Value Date/Time   COLORURINE YELLOW 04/26/2019 1136   COLORURINE YELLOW 04/26/2019 1136   APPEARANCEUR CLOUDY (A) 04/26/2019 1136   APPEARANCEUR CLEAR 04/26/2019 1136   LABSPEC 1.011 04/26/2019 1136   LABSPEC 1.020 04/26/2019 1136   PHURINE 5.0 04/26/2019 1136   PHURINE 5.5  04/26/2019 1136   GLUCOSEU NEGATIVE 04/26/2019 1136   GLUCOSEU NEGATIVE 04/26/2019 1136   HGBUR MODERATE (A) 04/26/2019 1136   HGBUR MODERATE (A) 04/26/2019 1136   BILIRUBINUR NEGATIVE 04/26/2019 1136   BILIRUBINUR NEGATIVE 04/26/2019 1136   [place] 04/26/2019 1136   [place] 04/26/2019 1136   PROTEINUR NEGATIVE 04/26/2019 1136   PROTEINUR NEGATIVE 04/26/2019 1136   NITRITE NEGATIVE 04/26/2019 1136   NITRITE NEGATIVE 04/26/2019 1136   LEUKOCYTESUR NEGATIVE 04/26/2019 1136   LEUKOCYTESUR TRACE (A) 04/26/2019 1136      Component Value Date/Time   PHART 7.356 05/01/2019 0611   PCO2ART 46.4 05/01/2019 0611   PO2ART 107.0 05/01/2019 0611   HCO3 26.1 05/01/2019 0611   TCO2 28 05/01/2019 0611   ACIDBASEDEF 5.0 (H) 04/17/2019 0620   O2SAT 67.3 05/04/2019 0550    No results found for: IRON, TIBC, FERRITIN, IRONPCTSAT     ASSESSMENT/PLAN:     1. Nonoliguric dialysis dependent ischemic ATN, requiring CRRT 9/3 to 9/8: Appears to be recovering GFR.  No  strong indication for intermittent HD today.  Azotemia reflects low GFR and catabolic effect of steroids.  Still could require further support with HD but doubtful.  He had a fairly normal creatinine at the time of presentation.  Remove temp cath soon if no longer needed. 2. Status post cardiac arrest, STEMI with lesion not amenable to PCI, ischemic cardiomyopathy with systolic heart failure and shock. As above. Advanced heart failure following.  Much improved. 3. 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 Anemia, hemoglobin stable 5. Hyperphosphatemia.  Should improve with recovering renal function.    [place], DO, [place]

## 2019-05-04 NOTE — Discharge Summary (Addendum)
Advanced Heart Failure Team  Discharge Summary   Patient ID: Lawrence Olson [name] [id number], DOB/AGE: 1950-09-07 68 y.o. Admit date: 04/16/2019 D/C date:     05/04/2019   Primary Discharge Diagnoses:  1. Cardiac arrest (ventricualr fibrillation) 2. CAD with acute myocardial infarction 3. Cardiogenic shock 4. Acute systolic HF 5. Anoxic brain injury 6. AKI 7. ACute hypoxic respiratory failure 8. Pneumonia 9. Pneumonitis   Hospital Course:  68 yo malewith unknown past medical history presented to the emergency department via EMS with cardiac arrest on 04/16/19. Per EMS, the patient was at work and suddenly collapsed with immediate bystander CPR performed. EMS arrived and performed approximately 20 minutes of CPR, [place] airway placed. Patient was found to be in ventricular fibrillation. He received 7 epinephrines and multiple shocks as well as 450 mg of amiodarone.ROSCwas obtained and patient was transported to the emergency department. Was noted to have anterolateral STEMI on EKG and taken directly to the Cath Lab. Patient was found to have occluded proximal LAD. Despite multiple attempts LAD unable to be opened and appeared somewhat chronic. Swan placed with showed cardiogenic shock. Impella placed in cath lab and patient transported to CCU. Echo showed EF 25-30%.  Patient had very protracted ICU course. Cardiogenic shock stabilized with Impella and milrinone. Patient improved from shock perspective and Impella weaned. However patient had persistent altered mental status concerning for anoxic brain injury. Followed by Neurology and brain CT, MRI and EEG ordered. Also developed fevers and severe leukocytosis. CXR with diffuse infiltrates and treated with broad spectrum abx and anti-fungal coverage without much improvement. Became  Hypotensive and developed AKI. Started on CVVHD with over 20 pounds of volume removal but CXR did not clear. ESR checked and was 128. Concerned raised for  amio-induced pneumonitis. Amio stopped and patient treated with steroids.   Patient extubated by CCM and mental status improved rapidly. CVVHD stopped and renal function began to recover. Patient seen by PT and CIR recommended.   Case d/w EP and given the VF occurred in setting of acute MI and patient had prolonged hospital stay felt that LifeVest was indicated and they would re-evaluate for ICD as outpatient.   LifeVest ordered and discussed with LifeVEst team. To be placed later tonight or in am. Patient OK to transfer to CIR prior to [place] placement.   Patient's coronary disease and HF will be treated medically and can re-evaluate revascularization options as he recovers. Would recommend cMRI to assess for viability in LAD territory.   Can stop prednisone after total of 1 week of therapy.   Discharge Weight: 181 pounds Discharge Vitals: Blood pressure 128/71, pulse 83, temperature 98.3 F (36.8 C), temperature source Oral, resp. rate 14, height _0  (1.676 m), weight 82.3 kg, SpO2 97 %.  Labs: Lab Results  Component Value Date   WBC 23.8 (H) 05/04/2019   HGB 8.5 (L) 05/04/2019   HCT 26.5 (L) 05/04/2019   MCV 96.7 05/04/2019   PLT 896 (H) 05/04/2019    Recent Labs  Lab 05/04/19 0521  NA 144  K 4.2  CL 110  CO2 24  BUN 110*  CREATININE 3.10*  CALCIUM 8.4*  GLUCOSE 86   Lab Results  Component Value Date   CHOL 167 04/16/2019   HDL 45 04/16/2019   LDLCALC 102 (H) 04/16/2019   TRIG 96 04/29/2019   BNP (last 3 results) No results for input(s): BNP in the last 8760 hours.  ProBNP (last 3 results) No results for input(s): PROBNP in the  last 8760 hours.   Diagnostic Studies/Procedures   Dg Swallowing Func-speech Pathology  Result Date: 05/03/2019 Objective Swallowing Evaluation: Type of Study: MBS-Modified Barium Swallow Study  Patient Details Name: Lawrence Olson [name] [id number] Date of Birth: 01-29-1951 Today's Date: 05/03/2019 Time: SLP Start Time (ACUTE ONLY):  0935 -SLP Stop Time (ACUTE ONLY): 0950 SLP Time Calculation (min) (ACUTE ONLY): 15 min Past Medical History: No past medical history on file. Past Surgical History: Past Surgical History: Procedure Laterality Date . RIGHT HEART CATH AND CORONARY ANGIOGRAPHY N/A 04/16/2019  Procedure: RIGHT HEART CATH AND CORONARY ANGIOGRAPHY;  Surgeon: Troy Sine, MD;  Location: [facility];  Service: Cardiovascular;  Laterality: N/A; . VENTRICULAR ASSIST DEVICE INSERTION N/A 04/16/2019  Procedure: VENTRICULAR ASSIST DEVICE INSERTION;  Surgeon: Troy Sine, MD;  Location: [facility];  Service: Cardiovascular;  Laterality: N/A; HPI: 68 yo M out of hospital cardiac arrest with ROSC, intubated 8/24-9/8, found to have STEMI, taken emergently to cath lab. Downtime ~20 minutes with anoxic brain injury and significant encephalopathy.  Demonstrating posturing initially, then responding some to family when speaking [place].  No known medical hx,  Subjective: Pt was encountered awake/alert and he was agreeable to MBS. Assessment / Plan / Recommendation CHL IP CLINICAL IMPRESSIONS 05/03/2019 Clinical Impression Lawrence Olson is a 68 year old male who presents with mild oropharyngeal dysphagia with resultant shallow, transient laryngeal penetration of thin liquid and moderate-severe vallecular residue with solids on today's exam.   Pt consumed trials of thin liquid, puree, regular solids, and a barium pill whole in puree.  Pt exhibited prolonged mastication of regular solids and reduced lingual strength, resulting in trace-mild oral residue with puree and solid trials.  Pharyngeal phase was remarkable for reduced pharyngeal constriction, resulting in moderate-severe vallecular residue with solids.  Pt additionally presented with reduced anterior movement of the hyolaryngeal segment and mildly reduced BOT retraction, which may also have contributed to the vallecular residue.  During barium pill trial, pt was unable to propel  the bolus posteriorly, and he subsequently expelled it from his oral cavity.  Recommend Dysphagia 2 (fine chop) solids and thin liquid with use of aspiration precautions and medications administered crushed or cut in half in puree.  Suspect that pt will be able to tolerate increasingly complex solids given time and improved strength.  SLP Visit Diagnosis Dysphagia, oropharyngeal phase (R13.12) Attention and concentration deficit following -- Frontal lobe and executive function deficit following -- Impact on safety and function Mild aspiration risk   CHL IP TREATMENT RECOMMENDATION 05/02/2019 Treatment Recommendations Defer until completion of intrumental exam   Prognosis 05/03/2019 Prognosis for Safe Diet Advancement Good Barriers to Reach Goals -- Barriers/Prognosis Comment -- CHL IP DIET RECOMMENDATION 05/03/2019 SLP Diet Recommendations Dysphagia 2 (Fine chop) solids;Thin liquid Liquid Administration via Straw;Cup Medication Administration Other (Comment) Compensations Slow rate;Small sips/bites;Follow solids with liquid Postural Changes Seated upright at 90 degrees   CHL IP OTHER RECOMMENDATIONS 05/03/2019 Recommended Consults -- Oral Care Recommendations Oral care BID Other Recommendations --   CHL IP FOLLOW UP RECOMMENDATIONS 05/02/2019 Follow up Recommendations Inpatient Rehab   CHL IP FREQUENCY AND DURATION 05/03/2019 Speech Therapy Frequency (ACUTE ONLY) min 2x/week Treatment Duration 2 weeks      CHL IP ORAL PHASE 05/03/2019 Oral Phase Impaired Oral - Pudding Teaspoon -- Oral - Pudding Cup -- Oral - Honey Teaspoon -- Oral - Honey Cup -- Oral - Nectar Teaspoon -- Oral - Nectar Cup -- Oral - Nectar Straw -- Oral -  Thin Teaspoon Premature spillage Oral - Thin Cup Premature spillage;Piecemeal swallowing Oral - Thin Straw Premature spillage Oral - Puree Delayed oral transit Oral - Mech Soft -- Oral - Regular Piecemeal swallowing;Delayed oral transit;Impaired mastication;Decreased bolus cohesion Oral - Multi-Consistency  -- Oral - Pill Weak lingual manipulation;Other (Comment) Oral Phase - Comment --  CHL IP PHARYNGEAL PHASE 05/03/2019 Pharyngeal Phase Impaired Pharyngeal- Pudding Teaspoon -- Pharyngeal -- Pharyngeal- Pudding Cup -- Pharyngeal -- Pharyngeal- Honey Teaspoon -- Pharyngeal -- Pharyngeal- Honey Cup -- Pharyngeal -- Pharyngeal- Nectar Teaspoon -- Pharyngeal -- Pharyngeal- Nectar Cup -- Pharyngeal -- Pharyngeal- Nectar Straw -- Pharyngeal -- Pharyngeal- Thin Teaspoon Delayed swallow initiation-pyriform sinuses Pharyngeal Material does not enter airway Pharyngeal- Thin Cup Delayed swallow initiation-pyriform sinuses;Reduced anterior laryngeal mobility;Reduced airway/laryngeal closure;Penetration/Aspiration before swallow;Pharyngeal residue - valleculae;Pharyngeal residue - pyriform Pharyngeal Material enters airway, remains ABOVE vocal cords then ejected out Pharyngeal- Thin Straw Delayed swallow initiation-pyriform sinuses;Reduced anterior laryngeal mobility;Reduced airway/laryngeal closure;Penetration/Aspiration before swallow;Pharyngeal residue - valleculae;Pharyngeal residue - pyriform Pharyngeal Material enters airway, remains ABOVE vocal cords then ejected out Pharyngeal- Puree Delayed swallow initiation-vallecula;Reduced anterior laryngeal mobility;Reduced tongue base retraction;Pharyngeal residue - valleculae;Reduced pharyngeal peristalsis Pharyngeal Material does not enter airway Pharyngeal- Mechanical Soft -- Pharyngeal -- Pharyngeal- Regular Delayed swallow initiation-vallecula;Reduced anterior laryngeal mobility;Reduced tongue base retraction;Pharyngeal residue - valleculae;Reduced pharyngeal peristalsis Pharyngeal Material does not enter airway Pharyngeal- Multi-consistency -- Pharyngeal -- Pharyngeal- Pill -- Pharyngeal -- Pharyngeal Comment --  No flowsheet data found. Elvia Collum [place] 05/03/2019, 1:37 PM  Bretta Bang, M.S., [facility] Office: [phone]              Discharge  Medications   Allergies as of 05/04/2019   No Known Allergies     Medication List    STOP taking these medications   fluocinolone 0.01 % external solution Commonly known as: SYNALAR   ketoconazole 2 % shampoo Commonly known as: NIZORAL     TAKE these medications   aspirin 81 MG chewable tablet Chew 1 tablet (81 mg total) by mouth daily. Start taking on: May 05, 2019   atorvastatin 80 MG tablet Commonly known as: LIPITOR Take 1 tablet (80 mg total) by mouth daily at 6 PM.   B-complex with vitamin C tablet Take 1 tablet by mouth daily. Start taking on: May 05, 2019   carvedilol 3.125 MG tablet Commonly known as: COREG Take 1 tablet (3.125 mg total) by mouth 2 (two) times daily with a meal.   chlorhexidine 0.12 % solution Commonly known as: PERIDEX 15 mLs by Mouth Rinse route 2 (two) times daily.   famotidine 20 MG tablet Commonly known as: PEPCID Take 1 tablet (20 mg total) by mouth daily. Start taking on: May 05, 2019   feeding supplement (ENSURE ENLIVE) Liqd Take 237 mLs by mouth 2 (two) times daily between meals.   heparin 1000 unit/mL Soln injection 1-6 mLs (1,000-6,000 Units total) by CRRT route as needed (Use to fill CRRT catheter with heparin 1000 units/mL per catheter volume.).   heparin 5000 UNIT/ML injection Inject 1 mL (5,000 Units total) into the skin every 8 (eight) hours.   hydrALAZINE 20 MG/ML injection Commonly known as: APRESOLINE Inject 1 mL (20 mg total) into the vein 2 (two) times daily as needed.   insulin aspart 100 UNIT/ML injection Commonly known as: novoLOG Inject 3-9 Units into the skin every 4 (four) hours.   insulin detemir 100 UNIT/ML injection Commonly known as: LEVEMIR Inject 0.05 mLs (5 Units total) into the skin every  12 (twelve) hours.   levalbuterol 0.63 MG/3ML nebulizer solution Commonly known as: XOPENEX Take 3 mLs (0.63 mg total) by nebulization every 3 (three) hours as needed for wheezing or  shortness of breath.   ondansetron 4 MG/2ML Soln injection Commonly known as: ZOFRAN Inject 2 mLs (4 mg total) into the vein every 6 (six) hours as needed for nausea.   predniSONE 20 MG tablet Commonly known as: DELTASONE Take 2 tablets (40 mg total) by mouth daily with breakfast. Start taking on: May 05, 2019   QUEtiapine 50 MG tablet Commonly known as: SEROQUEL Take 1 tablet (50 mg total) by mouth 2 (two) times daily.   tamsulosin 0.4 MG Caps capsule Commonly known as: FLOMAX Take 1 capsule (0.4 mg total) by mouth daily after supper.            Durable Medical Equipment  (From admission, onward)         Start     Ordered   05/04/19 1236  For home use only DME Vest life vest  Once     05/04/19 1235          Disposition   The patient will be discharged in stable condition to inpatient rehab. Marland Kitchen Discharge Instructions    Diet - low sodium heart healthy   Complete by: As directed    Increase activity slowly   Complete by: As directed          Duration of Discharge Encounter: Greater than 35 minutes   Signed, Glori Bickers, MD  4:19 PM

## 2019-05-04 NOTE — Progress Notes (Signed)
  Speech Language Pathology Treatment: Dysphagia  Patient Details Name: Lawrence Olson [name] [id number] DOB: 09/21/1950 Today's Date: 05/04/2019 Time: 7106-2694 SLP Time Calculation (min) (ACUTE ONLY): 10 min  Assessment / Plan / Recommendation Clinical Impression  Pt consumed thin liquids with no overt signs of difficulty even when drinking in larger volumes. SLP did provide education to pt and daughter on the importance of upright positioning and assisted in getting his HOB more elevated. Pt took only one bite of a soft solid, with prolonged mastication noted. SLP provided Min cues for use of liquid wash. Although given extra time he appeared to do well, he declined any further solids. His daughter said that his appetite has been limited, but that he is eating more at meals and doing well. Will continue current Dys 2 diet and thin liquids, but with good prognosis to advance solids as he has some return of strength.   HPI HPI: 68 yo M out of hospital cardiac arrest with ROSC, intubated 8/24-9/8, found to have STEMI, taken emergently to cath lab. Downtime ~20 minutes with anoxic brain injury and significant encephalopathy.  Demonstrating posturing initially, then responding some to family when speaking [place].  No known medical hx,      SLP Plan  Continue with current plan of care       Recommendations  Diet recommendations: Dysphagia 2 (fine chop);Thin liquid Liquids provided via: Cup;Straw Medication Administration: Crushed with puree Supervision: Patient able to self feed;Full supervision/cueing for compensatory strategies Compensations: Slow rate;Small sips/bites;Follow solids with liquid Postural Changes and/or Swallow Maneuvers: Seated upright 90 degrees;Upright 30-60 min after meal                Oral Care Recommendations: Oral care BID Follow up Recommendations: Inpatient Rehab SLP Visit Diagnosis: Dysphagia, oropharyngeal phase (R13.12) Plan: Continue with current plan of  care       GO                [name] Adan Baehr 05/04/2019, 12:34 PM  [name], M.A. [place] Acute Environmental education officer [phone] Office [phone]

## 2019-05-04 NOTE — TOC Initial Note (Signed)
Transition of Care [facility]) - Initial/Assessment Note    Patient Details  Name: Lawrence Olson [name] [id number] Date of Birth: 05/07/1951  Transition of [facility]) CM/SW Contact:    Bethena Roys, RN Phone Number: 05/04/2019, 2:32 PM  Clinical Narrative: Pt presented for acute stemi. Plan for medical therapy. PTA from home with spouse. CIR is following the patient for disposition needs. CM received DME orders for Life Vest. Information faxed to [facility]. Liaison Dorian Pod to cont MD [place] for orders. No further needs from CM at this time.                  Expected Discharge Plan: IP Rehab Facility Barriers to Discharge: No Barriers Identified    Expected Discharge Plan and Services Expected Discharge Plan: [place] In-house Referral: NA Discharge Planning Services: CM Consult   Living arrangements for the past 2 months: (townhome)                    Prior Living Arrangements/Services Living arrangements for the past 2 months: (townhome) Lives with:: Spouse   Do you feel safe going back to the place where you live?: Yes               Activities of Daily Living Home Assistive Devices/Equipment: None ADL Screening (condition at time of admission) Patient's cognitive ability adequate to safely complete daily activities?: Yes Is the patient deaf or have difficulty hearing?: No Does the patient have difficulty seeing, even when wearing glasses/contacts?: No Does the patient have difficulty concentrating, remembering, or making decisions?: No Patient able to express need for assistance with ADLs?: No Does the patient have difficulty dressing or bathing?: No Independently performs ADLs?: Yes (appropriate for developmental age) Does the patient have difficulty walking or climbing stairs?: Yes Weakness of Legs: None Weakness of Arms/Hands: None  Permission Sought/Granted Permission sought to share information with : Family Supports                 Emotional Assessment Appearance:: Appears stated age       Alcohol / Substance Use: Not Applicable Psych Involvement: No (comment)  Admission diagnosis:  Cardiogenic shock ([place]) [R57.0] Patient Active Problem List   Diagnosis Date Noted  . Pressure injury of skin 04/30/2019  . AKI (acute kidney injury) ([place])   . Acute respiratory failure ([place])   . Acute ST elevation myocardial infarction (STEMI) involving left anterior descending (LAD) coronary artery ([place]) 04/16/2019  . Cardiac arrest ([place]) 04/16/2019  . Acute respiratory failure with hypoxia and hypercarbia ([place]) 04/16/2019  . Cardiogenic shock ([place]) 04/16/2019  . VT (ventricular tachycardia) ([place])    PCP:  Patient, No Pcp Per Pharmacy:   [facility] [place], [place] - [address] [place] [address] Phone: [phone] Fax: [phone]     Social Determinants of Health (SDOH) Interventions    Readmission Risk Interventions No flowsheet data found.

## 2019-05-05 ENCOUNTER — Encounter (HOSPITAL_COMMUNITY): Payer: Self-pay

## 2019-05-05 ENCOUNTER — Other Ambulatory Visit: Payer: Self-pay

## 2019-05-05 ENCOUNTER — Telehealth: Payer: Self-pay | Admitting: *Deleted

## 2019-05-05 ENCOUNTER — Inpatient Hospital Stay (HOSPITAL_COMMUNITY): Payer: PPO | Admitting: Speech Pathology

## 2019-05-05 ENCOUNTER — Inpatient Hospital Stay (HOSPITAL_COMMUNITY): Payer: PPO | Admitting: Occupational Therapy

## 2019-05-05 ENCOUNTER — Inpatient Hospital Stay (HOSPITAL_COMMUNITY): Payer: PPO

## 2019-05-05 DIAGNOSIS — G931 Anoxic brain damage, not elsewhere classified: Secondary | ICD-10-CM

## 2019-05-05 LAB — COOXEMETRY PANEL
Carboxyhemoglobin: 1.7 % — ABNORMAL HIGH (ref 0.5–1.5)
Methemoglobin: 0.9 % (ref 0.0–1.5)
O2 Saturation: 52.8 %
Total hemoglobin: 9.1 g/dL — ABNORMAL LOW (ref 12.0–16.0)

## 2019-05-05 LAB — GLUCOSE, CAPILLARY
Glucose-Capillary: 148 mg/dL — ABNORMAL HIGH (ref 70–99)
Glucose-Capillary: 247 mg/dL — ABNORMAL HIGH (ref 70–99)

## 2019-05-05 MED ORDER — ENSURE MAX PROTEIN PO LIQD
11.0000 [oz_av] | Freq: Every day | ORAL | Status: DC
  Administered 2019-05-05 – 2019-05-16 (×11): 11 [oz_av] via ORAL
  Filled 2019-05-05 (×13): qty 330

## 2019-05-05 MED ORDER — CHLORHEXIDINE GLUCONATE CLOTH 2 % EX PADS
6.0000 | MEDICATED_PAD | Freq: Every day | CUTANEOUS | Status: DC
  Administered 2019-05-05 (×2): 6 via TOPICAL

## 2019-05-05 MED ORDER — LEVALBUTEROL HCL 0.63 MG/3ML IN NEBU: 0.6300 mg | INHALATION_SOLUTION | RESPIRATORY_TRACT | Status: DC | PRN

## 2019-05-05 MED ORDER — ALUM & MAG HYDROXIDE-SIMETH 200-200-20 MG/5ML PO SUSP: 30.0000 mL | ORAL | Status: DC | PRN

## 2019-05-05 MED ORDER — CHLORHEXIDINE GLUCONATE 0.12 % MT SOLN
15.0000 mL | Freq: Two times a day (BID) | OROMUCOSAL | Status: DC
  Administered 2019-05-05 – 2019-05-15 (×20): 15 mL via OROMUCOSAL
  Filled 2019-05-05 (×21): qty 15

## 2019-05-05 MED ORDER — CARVEDILOL 3.125 MG PO TABS
3.1250 mg | ORAL_TABLET | Freq: Two times a day (BID) | ORAL | Status: DC
  Administered 2019-05-05 – 2019-05-16 (×23): 3.125 mg via ORAL
  Filled 2019-05-05 (×23): qty 1

## 2019-05-05 MED ORDER — ATORVASTATIN CALCIUM 80 MG PO TABS
80.0000 mg | ORAL_TABLET | Freq: Every day | ORAL | Status: DC
  Administered 2019-05-05 – 2019-05-15 (×11): 80 mg via ORAL
  Filled 2019-05-05 (×11): qty 1

## 2019-05-05 MED ORDER — TAMSULOSIN HCL 0.4 MG PO CAPS
0.4000 mg | ORAL_CAPSULE | Freq: Every day | ORAL | Status: DC
  Administered 2019-05-05 – 2019-05-15 (×11): 0.4 mg via ORAL
  Filled 2019-05-05 (×11): qty 1

## 2019-05-05 MED ORDER — PREDNISONE 20 MG PO TABS
40.0000 mg | ORAL_TABLET | Freq: Every day | ORAL | Status: AC
  Administered 2019-05-05 – 2019-05-06 (×2): 40 mg via ORAL
  Filled 2019-05-05 (×2): qty 2

## 2019-05-05 MED ORDER — PROCHLORPERAZINE 25 MG RE SUPP: 12.5000 mg | Freq: Four times a day (QID) | RECTAL | Status: DC | PRN

## 2019-05-05 MED ORDER — PROCHLORPERAZINE EDISYLATE 10 MG/2ML IJ SOLN: 5.0000 mg | Freq: Four times a day (QID) | INTRAMUSCULAR | Status: DC | PRN

## 2019-05-05 MED ORDER — PRO-STAT SUGAR FREE PO LIQD
30.0000 mL | Freq: Two times a day (BID) | ORAL | Status: DC
  Administered 2019-05-05 – 2019-05-16 (×22): 30 mL via ORAL
  Filled 2019-05-05 (×22): qty 30

## 2019-05-05 MED ORDER — BISACODYL 10 MG RE SUPP
10.0000 mg | Freq: Every day | RECTAL | Status: DC | PRN
  Administered 2019-05-12: 10 mg via RECTAL
  Filled 2019-05-05: qty 1

## 2019-05-05 MED ORDER — FAMOTIDINE 20 MG PO TABS
20.0000 mg | ORAL_TABLET | Freq: Every day | ORAL | Status: DC
  Administered 2019-05-05 – 2019-05-16 (×12): 20 mg via ORAL
  Filled 2019-05-05 (×12): qty 1

## 2019-05-05 MED ORDER — B COMPLEX-C PO TABS
1.0000 | ORAL_TABLET | Freq: Every day | ORAL | Status: DC
  Administered 2019-05-05 – 2019-05-16 (×12): 1 via ORAL
  Filled 2019-05-05 (×12): qty 1

## 2019-05-05 MED ORDER — HEPARIN SODIUM (PORCINE) 5000 UNIT/ML IJ SOLN
5000.0000 [IU] | Freq: Three times a day (TID) | INTRAMUSCULAR | Status: DC
  Administered 2019-05-05 – 2019-05-16 (×33): 5000 [IU] via SUBCUTANEOUS
  Filled 2019-05-05 (×33): qty 1

## 2019-05-05 MED ORDER — POLYETHYLENE GLYCOL 3350 17 G PO PACK: 17.0000 g | PACK | Freq: Every day | ORAL | Status: DC | PRN

## 2019-05-05 MED ORDER — FLEET ENEMA 7-19 GM/118ML RE ENEM: 1.0000 | ENEMA | Freq: Once | RECTAL | Status: DC | PRN

## 2019-05-05 MED ORDER — ORAL CARE MOUTH RINSE
15.0000 mL | Freq: Two times a day (BID) | OROMUCOSAL | Status: DC
  Administered 2019-05-05 – 2019-05-14 (×14): 15 mL via OROMUCOSAL

## 2019-05-05 MED ORDER — SODIUM CHLORIDE 0.9% FLUSH: 10.0000 mL | INTRAVENOUS | Status: DC | PRN

## 2019-05-05 MED ORDER — QUETIAPINE FUMARATE 50 MG PO TABS
50.0000 mg | ORAL_TABLET | Freq: Two times a day (BID) | ORAL | Status: DC
  Administered 2019-05-05 – 2019-05-07 (×5): 50 mg via ORAL
  Filled 2019-05-05 (×5): qty 1

## 2019-05-05 MED ORDER — DIPHENHYDRAMINE HCL 12.5 MG/5ML PO ELIX: 12.5000 mg | ORAL_SOLUTION | Freq: Four times a day (QID) | ORAL | Status: DC | PRN

## 2019-05-05 MED ORDER — TRAZODONE HCL 50 MG PO TABS: 25.0000 mg | ORAL_TABLET | Freq: Every evening | ORAL | Status: DC | PRN

## 2019-05-05 MED ORDER — ACETAMINOPHEN 325 MG PO TABS
325.0000 mg | ORAL_TABLET | ORAL | Status: DC | PRN
  Administered 2019-05-09 – 2019-05-15 (×2): 650 mg via ORAL
  Filled 2019-05-05 (×2): qty 2

## 2019-05-05 MED ORDER — ASPIRIN 81 MG PO CHEW
81.0000 mg | CHEWABLE_TABLET | Freq: Every day | ORAL | Status: DC
  Administered 2019-05-05 – 2019-05-16 (×12): 81 mg via ORAL
  Filled 2019-05-05 (×12): qty 1

## 2019-05-05 MED ORDER — GUAIFENESIN-DM 100-10 MG/5ML PO SYRP: 5.0000 mL | ORAL_SOLUTION | Freq: Four times a day (QID) | ORAL | Status: DC | PRN

## 2019-05-05 MED ORDER — SODIUM CHLORIDE 0.9% FLUSH
10.0000 mL | Freq: Two times a day (BID) | INTRAVENOUS | Status: DC
  Administered 2019-05-05 – 2019-05-14 (×18): 10 mL

## 2019-05-05 MED ORDER — INSULIN DETEMIR 100 UNIT/ML ~~LOC~~ SOLN
5.0000 [IU] | Freq: Two times a day (BID) | SUBCUTANEOUS | Status: DC
  Administered 2019-05-05 – 2019-05-08 (×7): 5 [IU] via SUBCUTANEOUS
  Filled 2019-05-05 (×8): qty 0.05

## 2019-05-05 MED ORDER — PROCHLORPERAZINE MALEATE 5 MG PO TABS: 5.0000 mg | ORAL_TABLET | Freq: Four times a day (QID) | ORAL | Status: DC | PRN

## 2019-05-05 MED ORDER — HEPARIN SODIUM (PORCINE) 5000 UNIT/ML IJ SOLN: 5000.0000 [IU] | Freq: Three times a day (TID) | INTRAMUSCULAR | Status: DC

## 2019-05-05 NOTE — Telephone Encounter (Signed)
Lifevest ordered by Dr. Haroldine Laws for VF arrest.  Wife present for training and feels he is not strong enough at present to mange device. It is my observation that patient understands how to use the equipment however due to his physical weakness is unable to press response buttons.  For this reason will not begin wearing vest until his strength is better and he is able to physically maneuver device.  Dr. Haroldine Laws and nurse aware.  Will come back and do retrain once patient is stronger.  Librarian, academic aware of above.

## 2019-05-05 NOTE — Progress Notes (Signed)
[facility]    NEPHROLOGY PROGRESS NOTE  SUBJECTIVE: Patient seen and examined earlier this morning.  Working with physical therapy and appears quite fatigued.     OBJECTIVE:  Vitals:   05/05/19 0257 05/05/19 0919  BP: 130/80 134/81  Pulse: 78 95  Resp: 18   Temp: (!) 97.2 F (36.2 C)   SpO2: 94% 94%    Intake/Output Summary (Last 24 hours) at 05/05/2019 1143 Last data filed at 05/05/2019 0739 Gross per 24 hour  Intake 160 ml  Output 1425 ml  Net -1265 ml      General: resting, NAD HEENT: MMM [place] AT anicteric sclera Neck:  No JVD, no adenopathy CV:  Heart RRR  Lungs:  L/S CTA bilaterally Abd:  abd SNT/ND with normal BS GU:  Bladder non-palpable Extremities:  Trace bilateral LE edema. Skin:  No skin rash  MEDICATIONS:  . aspirin  81 mg Oral Daily  . atorvastatin  80 mg Oral q1800  . B-complex with vitamin C  1 tablet Oral Daily  . carvedilol  3.125 mg Oral BID WC  . chlorhexidine  15 mL Mouth Rinse BID  . Chlorhexidine Gluconate Cloth  6 each Topical Daily  . famotidine  20 mg Oral Daily  . feeding supplement (PRO-STAT SUGAR FREE 64)  30 mL Oral BID  . heparin  5,000 Units Subcutaneous Q8H  . insulin detemir  5 Units Subcutaneous Q12H  . mouth rinse  15 mL Mouth Rinse q12n4p  . predniSONE  40 mg Oral Q breakfast  . Ensure Max Protein  11 oz Oral Daily  . QUEtiapine  50 mg Oral BID  . sodium chloride flush  10-40 mL Intracatheter Q12H  . tamsulosin  0.4 mg Oral QPC supper       LABS:   CBC Latest Ref Rng & Units 05/04/2019 05/02/2019 05/01/2019  WBC 4.0 - 10.5 K/uL 23.8(H) 33.0(H) -  Hemoglobin 13.0 - 17.0 g/dL 8.5(L) 8.4(L) 9.2(L)  Hematocrit 39.0 - 52.0 % 26.5(L) 26.8(L) 27.0(L)  Platelets 150 - 400 K/uL 896(H) 892(H) -    CMP Latest Ref Rng & Units 05/04/2019 05/03/2019 05/02/2019  Glucose 70 - 99 mg/dL 86 149(H) 157(H)  BUN 8 - 23 mg/dL 110(H) 111(H) 87(H)  Creatinine 0.61 - 1.24 mg/dL 3.10(H) 3.20(H) 3.02(H)  Sodium 135 - 145 mmol/L 144  143 137  Potassium 3.5 - 5.1 mmol/L 4.2 4.3 3.9  Chloride 98 - 111 mmol/L 110 108 102  CO2 22 - 32 mmol/L 24 23 24   Calcium 8.9 - 10.3 mg/dL 8.4(L) 8.3(L) 8.1(L)  Total Protein 6.5 - 8.1 g/dL - - -  Total Bilirubin 0.3 - 1.2 mg/dL - - -  Alkaline Phos 38 - 126 U/L - - -  AST 15 - 41 U/L - - -  ALT 0 - 44 U/L - - -    Lab Results  Component Value Date   CALCIUM 8.4 (L) 05/04/2019   CAION 1.12 (L) 05/01/2019   PHOS 6.1 (H) 05/04/2019       Component Value Date/Time   COLORURINE YELLOW 04/26/2019 1136   COLORURINE YELLOW 04/26/2019 1136   APPEARANCEUR CLOUDY (A) 04/26/2019 1136   APPEARANCEUR CLEAR 04/26/2019 1136   LABSPEC 1.011 04/26/2019 1136   LABSPEC 1.020 04/26/2019 1136   PHURINE 5.0 04/26/2019 1136   PHURINE 5.5 04/26/2019 1136   GLUCOSEU NEGATIVE 04/26/2019 1136   GLUCOSEU NEGATIVE 04/26/2019 1136   HGBUR MODERATE (A) 04/26/2019 1136   HGBUR MODERATE (A) 04/26/2019 1136  BILIRUBINUR NEGATIVE 04/26/2019 [place] 04/26/2019 1136   [place] 04/26/2019 1136   [place] 04/26/2019 1136   PROTEINUR NEGATIVE 04/26/2019 1136   PROTEINUR NEGATIVE 04/26/2019 1136   NITRITE NEGATIVE 04/26/2019 1136   NITRITE NEGATIVE 04/26/2019 1136   LEUKOCYTESUR NEGATIVE 04/26/2019 1136   LEUKOCYTESUR TRACE (A) 04/26/2019 1136      Component Value Date/Time   PHART 7.356 05/01/2019 0611   PCO2ART 46.4 05/01/2019 0611   PO2ART 107.0 05/01/2019 0611   HCO3 26.1 05/01/2019 0611   TCO2 28 05/01/2019 0611   ACIDBASEDEF 5.0 (H) 04/17/2019 0620   O2SAT 52.8 05/05/2019 0418    No results found for: IRON, TIBC, FERRITIN, IRONPCTSAT     ASSESSMENT/PLAN:     1. Nonoliguric dialysis dependent ischemic ATN, requiring CRRT 9/3 to 9/8: Appears to be recovering GFR.  No strong indication for intermittent HD today.  Azotemia reflects low GFR and catabolic effect of steroids.  Still could require further support with HD but doubtful.  He had a fairly normal  creatinine at the time of presentation.  Remove temp cath soon if no longer needed.  Will check BMP in the morning. 2. Status post cardiac arrest, STEMI with lesion not amenable to PCI, ischemic cardiomyopathy with systolic heart failure and shock. As above. Advanced heart failure following.  Much improved. 3. 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 Anemia, hemoglobin stable 5. Hyperphosphatemia.  Should improve with recovering renal function.    [place], DO, [place]

## 2019-05-05 NOTE — Progress Notes (Signed)
Patient ID: Lawrence Olson, male   DOB: 09/13/1950, 68 y.o.   [name] [id number]   Advanced Heart Failure Team  Note   Primary Physician: Patient, No Pcp Per PCP-Cardiologist:  No primary care provider on file.  HPI:    Moved to CIR on 9/11  He is alert but very fatigued. Had LifeVest fitted today but not stronger enough to push buttons so Vest not placed.   Co-ox down to 53%  Denies CP or SOB. No palpitations or presyncope.   BUN/CR 111/3.20 -> 110/3.10  Echo 04/24/19 EF 25-30% no LV thrombus   Objective:    Vital Signs:   Temp:  [97.2 F (36.2 C)-97.7 F (36.5 C)] 97.2 F (36.2 C) (09/12 0257) Pulse Rate:  [71-95] 95 (09/12 0919) Resp:  [14-18] 18 (09/12 0257) BP: (117-135)/(73-81) 134/81 (09/12 0919) SpO2:  [94 %-99 %] 94 % (09/12 0919) Weight:  [80.4 kg-80.9 kg] 80.4 kg (09/12 0257) Last BM Date: 05/04/19  Weight change: Filed Weights   05/05/19 0246 05/05/19 0257  Weight: 80.9 kg 80.4 kg    Intake/Output:   Intake/Output Summary (Last 24 hours) at 05/05/2019 1602 Last data filed at 05/05/2019 1530 Gross per 24 hour  Intake 400 ml  Output 1925 ml  Net -1525 ml      Physical Exam   General:  Weak  appearing. No resp difficulty HEENT: normal Neck: supple. CVP 6-7  RIJ trialysis LIJ TLC Carotids 2+ bilat; no bruits. No lymphadenopathy or thryomegaly appreciated. Cor: PMI nondisplaced. Regular rate & rhythm. No rubs, gallops or murmurs. Lungs: clear Abdomen: obese soft, nontender, nondistended. No hepatosplenomegaly. No bruits or masses. Good bowel sounds. Extremities: no cyanosis, clubbing, rash, edema Neuro: alert & orientedx2 moves all 4 but very weak    Telemetry   NSR 80s Personally reviewed  Labs   Basic Metabolic Panel: Recent Labs  Lab 04/29/19 0327  04/30/19 0258 04/30/19 1600 05/01/19 0320 05/01/19 9562 05/02/19 0421 05/03/19 0440 05/04/19 0521  NA 135   < > 136 136 134* 138 137 143 144  K 4.6   < > 5.7* 4.4 4.1 4.3 3.9 4.3 4.2  CL  101   < > 101 102 101  --  102 108 110  CO2 24   < > 24 25 25   --  24 23 24   GLUCOSE 163*   < > 284* 133* 172*  --  157* 149* 86  BUN 44*   < > 48* 48* 49*  --  87* 111* 110*  CREATININE 1.90*   < > 2.00* 1.83* 1.82*  --  3.02* 3.20* 3.10*  CALCIUM 8.0*   < > 7.9* 8.0* 7.6*  --  8.1* 8.3* 8.4*  MG 2.8*  --  3.0*  --  2.9*  --   --   --   --   PHOS 4.5   < > 5.8* 4.6 4.7*  --  6.4* 7.9* 6.1*   < > = values in this interval not displayed.    Liver Function Tests: Recent Labs  Lab 04/30/19 1600 05/01/19 0320 05/02/19 0421 05/03/19 0440 05/04/19 0521  ALBUMIN 2.2* 1.9* 2.2* 2.2* 2.3*   No results for input(s): LIPASE, AMYLASE in the last 168 hours. No results for input(s): AMMONIA in the last 168 hours.  CBC: Recent Labs  Lab 04/29/19 0327  04/30/19 0258 05/01/19 0320 05/01/19 0611 05/02/19 0421 05/04/19 0521  WBC 30.7*  --  27.4* 34.5*  --  33.0* 23.8*  HGB 8.8*   < >  8.5* 8.1* 9.2* 8.4* 8.5*  HCT 28.5*   < > 27.7* 25.9* 27.0* 26.8* 26.5*  MCV 99.3  --  98.9 99.6  --  98.5 96.7  PLT 630*  --  667* 699*  --  892* 896*   < > = values in this interval not displayed.    Cardiac Enzymes: No results for input(s): CKTOTAL, CKMB, CKMBINDEX, TROPONINI in the last 168 hours.  BNP: BNP (last 3 results) No results for input(s): BNP in the last 8760 hours.  ProBNP (last 3 results) No results for input(s): PROBNP in the last 8760 hours.   CBG: Recent Labs  Lab 05/04/19 0809 05/04/19 1152 05/04/19 1555 05/04/19 2034 05/05/19 0624  GLUCAP 136* 137* 307* 284* 148*    Coagulation Studies: No results for input(s): LABPROT, INR in the last 72 hours.   Imaging   No results found.   Medications:     Current Medications: . aspirin  81 mg Oral Daily  . atorvastatin  80 mg Oral q1800  . B-complex with vitamin C  1 tablet Oral Daily  . carvedilol  3.125 mg Oral BID WC  . chlorhexidine  15 mL Mouth Rinse BID  . Chlorhexidine Gluconate Cloth  6 each Topical Daily  .  famotidine  20 mg Oral Daily  . feeding supplement (PRO-STAT SUGAR FREE 64)  30 mL Oral BID  . heparin  5,000 Units Subcutaneous Q8H  . insulin detemir  5 Units Subcutaneous Q12H  . mouth rinse  15 mL Mouth Rinse q12n4p  . predniSONE  40 mg Oral Q breakfast  . Ensure Max Protein  11 oz Oral Daily  . QUEtiapine  50 mg Oral BID  . sodium chloride flush  10-40 mL Intracatheter Q12H  . tamsulosin  0.4 mg Oral QPC supper    Infusions:   Assessment/Plan   1. CAD: Anterolateral STEMI, cath 8/24 showed occluded proximal LAD, unable to open.  Once we see how he recovers neurologically, will need to decide on feasibility of re-attempt PCI versus CABG. Medical therapy for the forseeable future. - No s/s ischemia - Continue medical therapy with ASA + statin.  - Continue low-dose carvedilol    2. Acute systolic HF - Echo with EF 25%, normal RV.  Impella removed 8/28.  Milrinone stopped 9/6.  - Co-ox 71 -> 53% will need to watch closely - No ACE/ARB/ARNI with AKI - On low dose carvedilol  - Volume status ok. Renal function seems to be recovering. Hopefully will not need further HD bu would not pull trialysis cath just yet.  - Cosndier hyrdal nitrates over next few days but do not want to drop BP too much in setting of recovering kidney function   3. AKI:  -Suspect hemodynamic (ATN)   - CVVHD started 9/3 - Volume status ok. Creatinine improving slowly   - Renal function seems to be recovering but BUN quite high. May need another session. Keep trialysis in for now. Try to remove soon.  4. Cardiac arrest/VF: 20 minutes downtime, concerned for anoxic brain injury.  Neurology following, per yesterday's note he seems to have intact brainstem function but minimal cortical function. EEG suggestive of diffuse encephalopathy. CT head without definite evidence for anoxic encephalopathy.  MRI without convincing evidence of anoxic injury - Mental status much improved. Stil lwith mild anoxic encepholpathy  but seems to be improving rapidly.  - Continue rehab.  - Will need LifeVest once strong enough to push alert deactivation button. Hopefully we can place  on Monday - Ok to be on CIR without LifeVest or continuous monitoring. He has been in hospital fro 2 weeks with no recurrent VT/VF so risk for SCD felt to be low.  - Keep K > 4.0/Mg 2.0  5. Deconditioning - very week. ? ICU myopathy - Appreciate CIR - Check CK and aldolase  6. Possible pneumonitis  - stop prednisone after Mondays dose.    Glori Bickers MD 05/05/2019 4:02 PM

## 2019-05-05 NOTE — Progress Notes (Signed)
PHYSICAL MEDICINE & REHABILITATION PROGRESS NOTE   Subjective/Complaints: + constipation last BM 9/9 , no abd pain  ROS- neg CP, SOB, N/V/D  Objective:   Dg Swallowing Func-speech Pathology  Result Date: 05/03/2019 Objective Swallowing Evaluation: Type of Study: MBS-Modified Barium Swallow Study  Patient Details Name: Lawrence Olson [name] [id number] Date of Birth: 1951-03-29 Today's Date: 05/03/2019 Time: SLP Start Time (ACUTE ONLY): 0935 -SLP Stop Time (ACUTE ONLY): 0950 SLP Time Calculation (min) (ACUTE ONLY): 15 min Past Medical History: No past medical history on file. Past Surgical History: Past Surgical History: Procedure Laterality Date . RIGHT HEART CATH AND CORONARY ANGIOGRAPHY N/A 04/16/2019  Procedure: RIGHT HEART CATH AND CORONARY ANGIOGRAPHY;  Surgeon: Troy Sine, MD;  Location: [facility];  Service: Cardiovascular;  Laterality: N/A; . VENTRICULAR ASSIST DEVICE INSERTION N/A 04/16/2019  Procedure: VENTRICULAR ASSIST DEVICE INSERTION;  Surgeon: Troy Sine, MD;  Location: [facility];  Service: Cardiovascular;  Laterality: N/A; HPI: 68 yo M out of hospital cardiac arrest with ROSC, intubated 8/24-9/8, found to have STEMI, taken emergently to cath lab. Downtime ~20 minutes with anoxic brain injury and significant encephalopathy.  Demonstrating posturing initially, then responding some to family when speaking [place].  No known medical hx,  Subjective: Pt was encountered awake/alert and he was agreeable to MBS. Assessment / Plan / Recommendation CHL IP CLINICAL IMPRESSIONS 05/03/2019 Clinical Impression Lawrence Olson is a 68 year old male who presents with mild oropharyngeal dysphagia with resultant shallow, transient laryngeal penetration of thin liquid and moderate-severe vallecular residue with solids on today's exam.   Pt consumed trials of thin liquid, puree, regular solids, and a barium pill whole in puree.  Pt exhibited prolonged mastication of regular solids  and reduced lingual strength, resulting in trace-mild oral residue with puree and solid trials.  Pharyngeal phase was remarkable for reduced pharyngeal constriction, resulting in moderate-severe vallecular residue with solids.  Pt additionally presented with reduced anterior movement of the hyolaryngeal segment and mildly reduced BOT retraction, which may also have contributed to the vallecular residue.  During barium pill trial, pt was unable to propel the bolus posteriorly, and he subsequently expelled it from his oral cavity.  Recommend Dysphagia 2 (fine chop) solids and thin liquid with use of aspiration precautions and medications administered crushed or cut in half in puree.  Suspect that pt will be able to tolerate increasingly complex solids given time and improved strength.  SLP Visit Diagnosis Dysphagia, oropharyngeal phase (R13.12) Attention and concentration deficit following -- Frontal lobe and executive function deficit following -- Impact on safety and function Mild aspiration risk   CHL IP TREATMENT RECOMMENDATION 05/02/2019 Treatment Recommendations Defer until completion of intrumental exam   Prognosis 05/03/2019 Prognosis for Safe Diet Advancement Good Barriers to Reach Goals -- Barriers/Prognosis Comment -- CHL IP DIET RECOMMENDATION 05/03/2019 SLP Diet Recommendations Dysphagia 2 (Fine chop) solids;Thin liquid Liquid Administration via Straw;Cup Medication Administration Other (Comment) Compensations Slow rate;Small sips/bites;Follow solids with liquid Postural Changes Seated upright at 90 degrees   CHL IP OTHER RECOMMENDATIONS 05/03/2019 Recommended Consults -- Oral Care Recommendations Oral care BID Other Recommendations --   CHL IP FOLLOW UP RECOMMENDATIONS 05/02/2019 Follow up Recommendations Inpatient Rehab   CHL IP FREQUENCY AND DURATION 05/03/2019 Speech Therapy Frequency (ACUTE ONLY) min 2x/week Treatment Duration 2 weeks      CHL IP ORAL PHASE 05/03/2019 Oral Phase Impaired Oral - Pudding  Teaspoon -- Oral - Pudding Cup -- Oral - Honey Teaspoon -- Oral -  Honey Cup -- Oral - Nectar Teaspoon -- Oral - Nectar Cup -- Oral - Nectar Straw -- Oral - Thin Teaspoon Premature spillage Oral - Thin Cup Premature spillage;Piecemeal swallowing Oral - Thin Straw Premature spillage Oral - Puree Delayed oral transit Oral - Mech Soft -- Oral - Regular Piecemeal swallowing;Delayed oral transit;Impaired mastication;Decreased bolus cohesion Oral - Multi-Consistency -- Oral - Pill Weak lingual manipulation;Other (Comment) Oral Phase - Comment --  CHL IP PHARYNGEAL PHASE 05/03/2019 Pharyngeal Phase Impaired Pharyngeal- Pudding Teaspoon -- Pharyngeal -- Pharyngeal- Pudding Cup -- Pharyngeal -- Pharyngeal- Honey Teaspoon -- Pharyngeal -- Pharyngeal- Honey Cup -- Pharyngeal -- Pharyngeal- Nectar Teaspoon -- Pharyngeal -- Pharyngeal- Nectar Cup -- Pharyngeal -- Pharyngeal- Nectar Straw -- Pharyngeal -- Pharyngeal- Thin Teaspoon Delayed swallow initiation-pyriform sinuses Pharyngeal Material does not enter airway Pharyngeal- Thin Cup Delayed swallow initiation-pyriform sinuses;Reduced anterior laryngeal mobility;Reduced airway/laryngeal closure;Penetration/Aspiration before swallow;Pharyngeal residue - valleculae;Pharyngeal residue - pyriform Pharyngeal Material enters airway, remains ABOVE vocal cords then ejected out Pharyngeal- Thin Straw Delayed swallow initiation-pyriform sinuses;Reduced anterior laryngeal mobility;Reduced airway/laryngeal closure;Penetration/Aspiration before swallow;Pharyngeal residue - valleculae;Pharyngeal residue - pyriform Pharyngeal Material enters airway, remains ABOVE vocal cords then ejected out Pharyngeal- Puree Delayed swallow initiation-vallecula;Reduced anterior laryngeal mobility;Reduced tongue base retraction;Pharyngeal residue - valleculae;Reduced pharyngeal peristalsis Pharyngeal Material does not enter airway Pharyngeal- Mechanical Soft -- Pharyngeal -- Pharyngeal- Regular Delayed swallow  initiation-vallecula;Reduced anterior laryngeal mobility;Reduced tongue base retraction;Pharyngeal residue - valleculae;Reduced pharyngeal peristalsis Pharyngeal Material does not enter airway Pharyngeal- Multi-consistency -- Pharyngeal -- Pharyngeal- Pill -- Pharyngeal -- Pharyngeal Comment --  No flowsheet data found. Lawrence Olson 05/03/2019, 1:37 PM  Bretta Bang, M.S., [facility] Office: [phone]             Recent Labs    05/04/19 0521  WBC 23.8*  HGB 8.5*  HCT 26.5*  PLT 896*   Recent Labs    05/03/19 0440 05/04/19 0521  NA 143 144  K 4.3 4.2  CL 108 110  CO2 23 24  GLUCOSE 149* 86  BUN 111* 110*  CREATININE 3.20* 3.10*  CALCIUM 8.3* 8.4*    Intake/Output Summary (Last 24 hours) at 05/05/2019 0914 Last data filed at 05/05/2019 0739 Gross per 24 hour  Intake 160 ml  Output 1425 ml  Net -1265 ml     Physical Exam: Vital Signs Blood pressure 130/80, pulse 78, temperature (!) 97.2 F (36.2 C), temperature source Oral, resp. rate 18, weight 80.4 kg, SpO2 94 %.   General: No acute distress Mood and affect are appropriate Heart: Regular rate and rhythm no rubs murmurs or extra sounds Lungs: Clear to auscultation, breathing unlabored, no rales or wheezes Abdomen: Positive bowel sounds, soft nontender to palpation, nondistended Extremities: No clubbing, cyanosis, or edema Skin: No evidence of breakdown, no evidence of rash Neurologic: Cranial nerves II through XII intact, motor strength is 5/5 in bilateral deltoid, bicep, tricep, grip, hip flexor, knee extensors, ankle dorsiflexor and plantar flexor Sensory exam normal sensation to light touch and proprioception in bilateral upper and lower extremities Cerebellar exam normal finger to nose to finger as well as heel to shin in bilateral upper and lower extremities Musculoskeletal: Full range of motion in all 4 extremities. No joint swelling   Assessment/Plan: 1. 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  Physiatrist is providing close team supervision and 24 hour management of active medical problems listed below.  Physiatrist  and rehab team continue to assess barriers to discharge/monitor patient progress toward functional and medical goals  Care Tool:  Bathing  Bathing activity did not occur: Safety/medical concerns           Bathing assist       Upper Body Dressing/Undressing Upper body dressing   What is the patient wearing?: Hospital gown only    Upper body assist Assist Level: Moderate Assistance - Patient 50 - 74%    Lower Body Dressing/Undressing Lower body dressing      What is the patient wearing?: [place] only     Lower body assist       Toileting Toileting    Toileting assist Assist for toileting: Moderate Assistance - Patient 50 - 74%     Transfers Chair/bed transfer  Transfers assist           Locomotion Ambulation   Ambulation assist              Walk 10 feet activity   Assist           Walk 50 feet activity   Assist           Walk 150 feet activity   Assist           Walk 10 feet on uneven surface  activity   Assist           Wheelchair     Assist               Wheelchair 50 feet with 2 turns activity    Assist            Wheelchair 150 feet activity     Assist          Blood pressure 130/80, pulse 78, temperature (!) 97.2 F (36.2 C), temperature source Oral, resp. rate 18, weight 80.4 kg, SpO2 94 %.  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 Antithrombotics: -DVT/anticoagulation:Pharmaceutical:Heparin -antiplatelet therapy: ASA 3. Pain Management:N/A 4. Mood:LCSW to follow for evaluation and  support. Does not have full awareness of deficits. -antipsychotic agents: On Seroquel bid 5. Neuropsych: This patientis notcapable of making decisions on hisown behalf. 6. Skin/Wound Care:Routine pressure relief measures 7. Fluids/Electrolytes/Nutrition:strict I/O. Check lytes in am.  8.CAD with STEMI/VTarrest: Treated medically for now.  -LifeVest orderedand to be fitted tonight or tomorrow morning per cardiology. Cardiology is aware that there is NOT continuous monitoring on inpatient rehab as we await lifevest. 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 Cr around 3.1 -nephrology following -daily labs 12. Urine retention: --monitorbladder emptyingwith PVR checks.  On Flomax for urinary retention?  13. Acute blood loss anemia: Recheck in a.m. H&H stable 14.Combined heart failure/ischemic cardiomyopathy: Monitor for signs of overload and check weights daily. On Coreg twice daily. No ACE/arms or BB due to AKI and shock 15.Leukocytosis/Thrombocytosis: Afebrile off antibiotics.Improving and felt to bereactive/secondary to steroids. Monitor for signs of infection.  16.  Constipation adjust laxative  LOS: 1 days A FACE TO FACE EVALUATION WAS PERFORMED  Lawrence Olson 05/05/2019, 9:14 AM

## 2019-05-05 NOTE — Evaluation (Signed)
Speech Language Pathology Assessment and Plan  Patient Details  Name: Lawrence Olson [name] [id number] Date of Birth: 12-13-1950  SLP Diagnosis: Cognitive Impairments;Dysphagia  Rehab Potential: Good ELOS: 20-24 days    Today's Date: 05/05/2019 SLP Individual Time: 0800-0900 SLP Individual Time Calculation (min): 60 min   Problem List:  Patient Active Problem List   Diagnosis Date Noted  . Anoxic brain injury ([name]) 05/05/2019  . Pressure injury of skin 04/30/2019  . AKI (acute kidney injury) ([place])   . Acute respiratory failure ([place])   . Acute ST elevation myocardial infarction (STEMI) involving left anterior descending (LAD) coronary artery ([place]) 04/16/2019  . Cardiac arrest ([place]) 04/16/2019  . Acute respiratory failure with hypoxia and hypercarbia ([place]) 04/16/2019  . Cardiogenic shock ([place]) 04/16/2019  . VT (ventricular tachycardia) ([facility])    Past Medical History:  Past Medical History:  Diagnosis Date  . Jaundice    needed hospitalization in [place]   Past Surgical History:  Past Surgical History:  Procedure Laterality Date  . RIGHT HEART CATH AND CORONARY ANGIOGRAPHY N/A 04/16/2019   Procedure: RIGHT HEART CATH AND CORONARY ANGIOGRAPHY;  Surgeon: Troy Sine, MD;  Location: [facility];  Service: Cardiovascular;  Laterality: N/A;  . VENTRICULAR ASSIST DEVICE INSERTION N/A 04/16/2019   Procedure: VENTRICULAR ASSIST DEVICE INSERTION;  Surgeon: Troy Sine, MD;  Location: [facility];  Service: Cardiovascular;  Laterality: N/A;    Assessment / Plan / Recommendation Clinical Impression   Lawrence Olson is a 68 year old male with no prior medical issues who was admitted on 04/16/19 after witnessed cardiac arrest at work. Question of onlooker CPR-- he was found to have VT and required CPR/ACLS protocol approx 20 minutes with king airway placed by EMS. He as intubated in ED, noted to be severely hypoxic and hypercapneic and found to have ST elevation due to MI.  Despite prolonged downtime, some purposeful movement noted and he was taken to cath lab for emergent catheterization. Attempts at opening LAD unsuccessful with development of VT and impella placed with pressors for cardiogenic shock. Hospital course significant for oozing from left IJ as well as issues with hematuria, fevers treated with empiric antibiotics, ectopy requiring amiodarone as well as need for suture repair of right femoral arterial puncture site as well as L-IJ central line. 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 He was started on IV keppra due to high risk of seizures and EEG done revealing moderate to severe diffuse encephalopathy.   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 Stroke question due to small vessel disease v/s embolic and ASA recommended for secondary stroke prevention. As mentation started improving without definate seizures, keppra was discontinued on 09/01.  Fluid overload treated with IV diuresis but he developed multifactorial AKI likely due to ATN. Dr. Noel Journey consulted 9/03 and CRRT initiated due to ongoing issues with overload. He continued to have issues with fevers and Vancomycinresumed/meropenum added 9/6. He has had issues with delirium and agitation --LTM EEG negative for seizures and prolonged encephalopathy felt to be due to ischemic BI. 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 Antibiotics d/c as cultures negative and CRRT discontinued as fluid status improved with good UOP. Renal status improving. Follow up echo with  EF 25-30% and Life Vest to be ordered. MBS done 9/10 and he was started on dysphagia 2, thins.Therapy ongoing and patient noted to be limited by  signifiant debility. CIR recommended due to functional decline.SLP evaluation completed on 05/05/2019 with the following results:   Pt presents with a mild dysphagia characterized by impaired mastication of solid boluses in the setting of generalized debility.  Pt had x1 immediate cough following a large volume bolus of thin liquids but no other overt s/s of aspiration with solids or liquids.  As a result, recommend that pt remain on currently prescribed diet for now with trials of advanced textures with SLP as conditioning improves.   Pt also presents with moderately severe cognitive deficits in the setting of questionable anoxic brain injury.  Impairments lie in the areas of orientation, memory, problem solving, intellectual awareness, selective attention, and safety awareness.  As a result, pt currently requires between mod-max assist to complete basic tasks.   Given the abovementioned deficits, pt would benefit from skilled ST while inpatient in order to maximize functional independence and reduce burden of care prior to discharge.  Anticipate that pt will need 24/7 supervision at discharge in addition to [place] follow up at next level of care.  Wife is aware of SLP's recommendations and is already prepared to provide recommended level of assist.     Skilled Therapeutic Interventions          Cognitive-linguistic and bedside swallow evaluation completed with results and recommendations reviewed with patient and family.     SLP Assessment  Patient will need skilled Speech Lanaguage Pathology Services during CIR admission    Recommendations  SLP Diet Recommendations: Dysphagia 2 (Fine chop);Thin Liquid Administration via: Cup;Straw Medication Administration: Crushed with puree Supervision: Patient able to self feed;Full supervision/cueing for compensatory strategies Compensations: Slow rate;Small sips/bites;Follow solids with liquid Postural Changes and/or Swallow Maneuvers: Seated upright 90  degrees;Upright 30-60 min after meal Oral Care Recommendations: Oral care BID Patient destination: Home Follow up Recommendations: Home Health SLP;24 hour supervision/assistance Equipment Recommended: None recommended by SLP    SLP Frequency 3 to 5 out of 7 days   SLP Duration  SLP Intensity  SLP Treatment/Interventions 20-24 days  Minumum of 1-2 x/day, 30 to 90 minutes  Cognitive remediation/compensation;Internal/external aids;Cueing hierarchy;Dysphagia/aspiration precaution training;Environmental controls;Functional tasks;Patient/family education    Pain Pain Assessment Pain Scale: 0-10 Pain Score: 0-No pain  Prior Functioning Cognitive/Linguistic Baseline: Within functional limits Type of Home: House  Lives With: Spouse Available Help at Discharge: Family Vocation: Full time employment  Short Term Goals: Week 1: SLP Short Term Goal 1 (Week 1): Pt will consume dys 2 textures and thin liquids with supervision cues for use of swallowing precautions and minimal overt s/s of aspiration. SLP Short Term Goal 2 (Week 1): Pt will consume therapeutic trials of dys 3 textures with supervision cues for use of swallowing precautions and minimal overt s/s of afspiration over 3 consecutive sessions prior to diet advancement. SLP Short Term Goal 3 (Week 1): Pt will utilize external aids to recall basic, daily information with mod assist verbal cues. SLP Short Term Goal 4 (Week 1): Pt will selectively attend to basic tasks for 15 minutes in the presence of mild environmental distractions with min verbal cues for redirection. SLP Short Term Goal 5 (Week 1): Pt will complete basic tasks with mod assist verbal cues for functional problem solving.  Refer to Care Plan for Long Term Goals  Recommendations for other services: None   Discharge  Criteria: Patient will be discharged from SLP if patient refuses treatment 3 consecutive times without medical reason, if treatment goals not met, if there  is a change in medical status, if patient makes no progress towards goals or if patient is discharged from hospital.  The above assessment, treatment plan, treatment alternatives and goals were discussed and mutually agreed upon: by patient and by family  Keoshia Steinmetz, Selinda Orion 05/05/2019, 9:22 AM

## 2019-05-05 NOTE — Evaluation (Signed)
Occupational Therapy Assessment and Plan  Patient Details  Name: Lawrence Olson [name] [id number] Date of Birth: 1950/12/08  OT Diagnosis: abnormal posture, acute pain, cognitive deficits, lumbago (low back pain) and muscle weakness (generalized) Rehab Potential: Rehab Potential (ACUTE ONLY): Fair ELOS: 18-22 days   Today's Date: 05/05/2019 OT Individual Time: 2800-3491 OT Individual Time Calculation (min): 60 min     Problem List:  Patient Active Problem List   Diagnosis Date Noted  . Anoxic brain injury ([place]) 05/05/2019  . Pressure injury of skin 04/30/2019  . AKI (acute kidney injury) ([place])   . Acute respiratory failure ([place])   . Acute ST elevation myocardial infarction (STEMI) involving left anterior descending (LAD) coronary artery ([place]) 04/16/2019  . Cardiac arrest ([place]) 04/16/2019  . Acute respiratory failure with hypoxia and hypercarbia ([place]) 04/16/2019  . Cardiogenic shock ([place]) 04/16/2019  . VT (ventricular tachycardia) ([facility])     Past Medical History:  Past Medical History:  Diagnosis Date  . Jaundice    needed hospitalization in [place]   Past Surgical History:  Past Surgical History:  Procedure Laterality Date  . RIGHT HEART CATH AND CORONARY ANGIOGRAPHY N/A 04/16/2019   Procedure: RIGHT HEART CATH AND CORONARY ANGIOGRAPHY;  Surgeon: Troy Sine, MD;  Location: [facility];  Service: Cardiovascular;  Laterality: N/A;  . VENTRICULAR ASSIST DEVICE INSERTION N/A 04/16/2019   Procedure: VENTRICULAR ASSIST DEVICE INSERTION;  Surgeon: Troy Sine, MD;  Location: [facility];  Service: Cardiovascular;  Laterality: N/A;    Assessment & Plan Clinical Impression: Patient is a 68 y.o. year old male with no prior medical issues who was admitted on 04/16/19 after witnessed cardiac arrest at work. Question of onlooker CPR-- he was found to have VT and required CPR/ACLS protocol approx 20 minutes with king airway placed by EMS. He as intubated in ED, noted to be  severely hypoxic and hypercapneic and found to have ST elevation due to MI. Despite prolonged downtime, some purposeful movement noted and he was taken to cath lab for emergent catheterization. Attempts at opening LAD unsuccessful with development of VT and impella placed with pressors for cardiogenic shock. Hospital course significant for oozing from left IJ as well as issues with hematuria, fevers treated with empiric antibiotics, ectopy requiring amiodarone as well as need for suture repair of right femoral arterial puncture site as well as L-IJ central line. 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 He was started on IV keppra due to high risk of seizures and EEG done revealing moderate to severe diffuse encephalopathy.   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 Stroke question due to small vessel disease v/s embolic and ASA recommended for secondary stroke prevention. As mentation started improving without definate seizures, keppra was discontinued on 09/01.  Fluid overload treated with IV diuresis but he developed multifactorial AKI likely due to ATN. Dr. Noel Journey consulted 9/03 and CRRT initiated due to ongoing issues with overload. He continued to have issues with fevers and Vancomycinresumed/meropenum added 9/6. He has had issues with delirium and agitation --LTM EEG negative for seizures and prolonged encephalopathy felt to be due to ischemic BI. 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 Antibiotics d/c as cultures negative and CRRT discontinued as fluid  status improved with good UOP. Renal status improving. Follow up echo with EF 25-30% and Life Vest to be ordered. MBS done 9/10 and he was  started on dysphagia 2, thins.Therapy ongoing and patient noted to be limited by signifiant debility. CIR recommended due to functional deccline.Patient transferred to CIR on 05/04/2019 .    Patient currently requires max with basic self-care skills secondary to muscle weakness, decreased oxygen support, impaired timing and sequencing and decreased motor planning, decreased initiation, decreased attention, decreased awareness, decreased problem solving, decreased safety awareness, decreased memory and delayed processing and decreased sitting balance, decreased standing balance and decreased postural control.  Prior to hospitalization, patient could complete ADLs with independent .  Patient will benefit from skilled intervention to decrease level of assist with basic self-care skills prior to discharge home with care partner.  Anticipate patient will require 24 hour supervision and minimal physical assistance and follow up home health.  OT - End of Session Activity Tolerance: Tolerates 30+ min activity with multiple rests Endurance Deficit: Yes Endurance Deficit Description: requires several rest breaks and is limited in mobility due to fatigue with minimal activity OT Assessment Rehab Potential (ACUTE ONLY): Fair OT Patient demonstrates impairments in the following area(s): Balance;Cognition;Endurance;Motor;Pain;Perception;Safety OT Basic ADL's Functional Problem(s): Grooming;Bathing;Dressing;Toileting OT Transfers Functional Problem(s): Toilet;Tub/Shower OT Additional Impairment(s): None OT Plan OT Intensity: Minimum of 1-2 x/day, 45 to 90 minutes OT Frequency: 5 out of 7 days OT Duration/Estimated Length of Stay: 18-22 days OT Treatment/Interventions: Balance/vestibular training;Cognitive remediation/compensation;Community reintegration;Discharge planning;Disease mangement/prevention;DME/adaptive equipment instruction;Functional mobility training;Neuromuscular re-education;Pain  management;Patient/family education;Psychosocial support;Self Care/advanced ADL retraining;Therapeutic Activities;Therapeutic Exercise;UE/LE Strength taining/ROM;UE/LE Coordination activities OT Self Feeding Anticipated Outcome(s): Supervision OT Basic Self-Care Anticipated Outcome(s): Supervision OT Toileting Anticipated Outcome(s): Supervision - Min Assist OT Bathroom Transfers Anticipated Outcome(s): Min assist OT Recommendation Patient destination: Home Follow Up Recommendations: Home health OT;24 hour supervision/assistance Equipment Recommended: 3 in 1 bedside comode;Tub/shower bench   Skilled Therapeutic Intervention OT eval completed with discussion of rehab process, OT purpose, POC, ELOS, and goals.  ADL assessment completed with bathing and dressing at sit > stand level at sink.  Pt asleep upon arrival, requiring multimodal cues and increased time to fully arouse.  Pt with limited participation in evaluation with difficulty following commands and wife frequently providing cues and encouragement in [place] (pt native language).  Difficult to assess cognition as pt's wife providing max cues throughout.  Completed stand pivot transfers bed <> w/c and bed <> BSC with max assist.  Pt required max assist for sit > stand and +2 for LB dressing at standing level.  Pt with very minimal initiation during self-care tasks, requiring increased cues and assistance for thoroughness and completion of tasks.  OT Evaluation Precautions/Restrictions  Precautions Precautions: Fall Restrictions Weight Bearing Restrictions: No Pain Pt with c/o pain in back.  Repositioned. Home Living/Prior Functioning Home Living Available Help at Discharge: Family Type of Home: House Home Access: Stairs to enter Technical brewer of Steps: 1 Home Layout: Two level, Able to live on main level with bedroom/bathroom Alternate Level Stairs-Number of Steps: 10 Alternate Level Stairs-Rails: Right  Lives With:  Spouse Prior Function Level of Independence: Independent with gait, Independent with basic ADLs, Independent with homemaking with ambulation  Able to Take Stairs?: Yes Driving: Yes Vocation: Full time employment ADL ADL Eating: Minimal assistance Grooming: Minimal assistance Upper Body Bathing: Moderate cueing, Minimal assistance Where Assessed-Upper Body Bathing: Sitting at sink Lower Body Bathing: Maximal assistance Where Assessed-Lower Body Bathing: Sitting at sink, Standing at sink  Upper Body Dressing: Maximal assistance Where Assessed-Upper Body Dressing: Sitting at sink Lower Body Dressing: Dependent Where Assessed-Lower Body Dressing: Sitting at sink, Standing at sink Vision Baseline Vision/History: Wears glasses Wears Glasses: At all times Patient Visual Report: No change from baseline Vision Assessment?: Vision impaired- to be further tested in functional context Additional Comments: limited due to fatigue, as pt with difficulty maintaining eyes open and limited ability to follow commands Cognition Overall Cognitive Status: Impaired/Different from baseline Arousal/Alertness: Awake/alert Orientation Level: Person;Place(oriented to name and month of birth but no date or year, also oriented to Zacarias Pontes) Year: 2020 Month: September(wife provided cue in [place] (native language)) Day of Week: Correct Memory: Impaired Memory Impairment: Decreased recall of new information;Retrieval deficit Immediate Memory Recall: Sock;Blue;Bed Memory Recall Sock: With Cue Memory Recall Blue: With Cue Memory Recall Bed: With Cue Attention: Sustained;Selective Sustained Attention: Impaired Sustained Attention Impairment: Verbal basic;Functional basic Selective Attention: Impaired Selective Attention Impairment: Functional basic;Verbal basic Awareness: Impaired Awareness Impairment: Intellectual impairment Problem Solving: Impaired Problem Solving Impairment: Functional  basic Executive Function: Self Monitoring;Self Correcting;Sequencing Sequencing: Impaired Sequencing Impairment: Functional basic Self Monitoring: Impaired Self Monitoring Impairment: Functional basic Self Correcting: Impaired Self Correcting Impairment: Functional basic Behaviors: Restless;Poor frustration tolerance Safety/Judgment: Impaired Sensation Sensation Light Touch: Appears Intact Additional Comments: Unable to formally test due to cognitive deficits, but responsive to light touch in all extremities Coordination Gross Motor Movements are Fluid and Coordinated: No Fine Motor Movements are Fluid and Coordinated: No Coordination and Movement Description: Generalized weakness and deconditioning, unable to hold a cup with fatigue during session Finger Nose Finger Test: quite slow and deliberate, ?due to fatigue Motor  Motor Motor: Other (comment) Motor - Skilled Clinical Observations: decreased strength and activity tolerance, will continue to assess for any additional neurological motor deficits as mobility improves Mobility  Bed Mobility Bed Mobility: Rolling Right;Rolling Left;Supine to Sit;Sit to Supine Rolling Right: Moderate Assistance - Patient 50-74% Rolling Left: Moderate Assistance - Patient 50-74% Supine to Sit: Moderate Assistance - Patient 50-74% Sit to Supine: Moderate Assistance - Patient 50-74% Transfers Sit to Stand: Maximal Assistance - Patient 25-49% Stand to Sit: Maximal Assistance - Patient 25-49%  Trunk/Postural Assessment  Cervical Assessment Cervical Assessment: Exceptions to WFL(forward head) Thoracic Assessment Thoracic Assessment: Exceptions to WFL(rounded shoulders) Lumbar Assessment Lumbar Assessment: Exceptions to WFL(posterior pelvic tilt) Postural Control Postural Control: Deficits on evaluation(delayed/decreased)  Balance Balance Balance Assessed: Yes Static Sitting Balance Static Sitting - Balance Support: No upper extremity  supported Static Sitting - Level of Assistance: 3: Mod assist Static Sitting - Comment/# of Minutes: Initially did not use UEs to support himself in sitting, provided cues for using B UE support and he was able to progress to close supervision >1 min sitting EOB Dynamic Sitting Balance Dynamic Sitting - Balance Support: Right upper extremity supported;Left upper extremity supported Dynamic Sitting - Level of Assistance: 3: Mod assist Dynamic Sitting - Balance Activities: Lateral lean/weight shifting;Forward lean/weight shifting;Reaching for objects Sitting balance - Comments: leans far forward and to the R in sitting without B UE support Static Standing Balance Static Standing - Balance Support: Bilateral upper extremity supported Static Standing - Level of Assistance: 3: Mod assist Static Standing - Comment/# of Minutes: unable to maintain standing for >30 seconds due to fatigue Dynamic Standing Balance Dynamic Standing - Balance Support: Bilateral upper extremity supported Dynamic Standing - Level of Assistance: 2: Max assist Dynamic Standing - Balance Activities: Lateral lean/weight shifting;Forward lean/weight shifting Dynamic Standing - Comments: assessed with B HHA during  stand pivot transfer Extremity/Trunk Assessment RUE Assessment RUE Assessment: Exceptions to [facility] Passive Range of Motion (PROM) Comments: ~120 degrees shoulder flexion Active Range of Motion (AROM) Comments: ~90 degrees shoulder flexion General Strength Comments: Grossly in sitting: shoulder flexion 3+/5, elbow flexion/extension 4/5, grip 4/5 LUE Assessment LUE Assessment: Exceptions to [facility] Passive Range of Motion (PROM) Comments: ~120 degrees shoulder flexion Active Range of Motion (AROM) Comments: ~90 degrees shoulder flexion General Strength Comments: Grossly in sitting: shoulder flexion 4-/5, elbow flexion/extension 4+/5, grip 4+/5     Refer to Care Plan for Long Term Goals  Recommendations for other  services: None    Discharge Criteria: Patient will be discharged from OT if patient refuses treatment 3 consecutive times without medical reason, if treatment goals not met, if there is a change in medical status, if patient makes no progress towards goals or if patient is discharged from hospital.  The above assessment, treatment plan, treatment alternatives and goals were discussed and mutually agreed upon: by patient  Simonne Come 05/05/2019, 3:44 PM

## 2019-05-05 NOTE — Evaluation (Signed)
Physical Therapy Assessment and Plan  Patient Details  Name: Lawrence Olson [name] [id number] Date of Birth: 30-Mar-1951  PT Diagnosis: Abnormal posture, Abnormality of gait, Cognitive deficits, Coordination disorder, Difficulty walking, Edema, Impaired cognition, Low back pain and Muscle weakness Rehab Potential: Good ELOS: 2.5-3 weeks   Today's Date: 05/05/2019 PT Individual Time: 0900-1000 PT Individual Time Calculation (min): 60 min    Problem List:  Patient Active Problem List   Diagnosis Date Noted  . Anoxic brain injury ([place]) 05/05/2019  . Pressure injury of skin 04/30/2019  . AKI (acute kidney injury) ([place])   . Acute respiratory failure ([place])   . Acute ST elevation myocardial infarction (STEMI) involving left anterior descending (LAD) coronary artery ([place]) 04/16/2019  . Cardiac arrest ([place]) 04/16/2019  . Acute respiratory failure with hypoxia and hypercarbia ([place]) 04/16/2019  . Cardiogenic shock ([place]) 04/16/2019  . VT (ventricular tachycardia) ([facility])     Past Medical History:  Past Medical History:  Diagnosis Date  . Jaundice    needed hospitalization in [place]   Past Surgical History:  Past Surgical History:  Procedure Laterality Date  . RIGHT HEART CATH AND CORONARY ANGIOGRAPHY N/A 04/16/2019   Procedure: RIGHT HEART CATH AND CORONARY ANGIOGRAPHY;  Surgeon: Troy Sine, MD;  Location: [facility];  Service: Cardiovascular;  Laterality: N/A;  . VENTRICULAR ASSIST DEVICE INSERTION N/A 04/16/2019   Procedure: VENTRICULAR ASSIST DEVICE INSERTION;  Surgeon: Troy Sine, MD;  Location: [facility];  Service: Cardiovascular;  Laterality: N/A;    Assessment & Plan Clinical Impression: Patient is a 68 y.o. year old male with no prior medical issues who was admitted on 04/16/19 after witnessed cardiac arrest at work. Question of onlooker CPR-- he was found to have VT and required CPR/ACLS protocol approx 20 minutes with king airway placed by EMS. He as  intubated in ED, noted to be severely hypoxic and hypercapneic and found to have ST elevation due to MI. Despite prolonged downtime, some purposeful movement noted and he was taken to cath lab for emergent catheterization. Attempts at opening LAD unsuccessful with development of VT and impella placed with pressors for cardiogenic shock. Hospital course significant for oozing from left IJ as well as issues with hematuria, fevers treated with empiric antibiotics, ectopy requiring amiodarone as well as need for suture repair of right femoral arterial puncture site as well as L-IJ central line. 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 He was started on IV keppra due to high risk of seizures and EEG done revealing moderate to severe diffuse encephalopathy.   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 Stroke question due to small vessel disease v/s embolic and ASA recommended for secondary stroke prevention. As mentation started improving without definate seizures, keppra was discontinued on 09/01.  Fluid overload treated with IV diuresis but he developed multifactorial AKI likely due to ATN. Dr. Noel Journey consulted 9/03 and CRRT initiated due to ongoing issues with overload. He continued to have issues with fevers and Vancomycinresumed/meropenum added 9/6. He has had issues with delirium and agitation --LTM EEG negative for seizures and prolonged encephalopathy felt to be due to ischemic BI. 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 Antibiotics d/c as cultures negative and CRRT discontinued as  fluid status improved with good UOP. Renal status improving. Follow up echo with EF 25-30% and Life Vest to be ordered.  MBS done 9/10 and he was started on dysphagia 2, thins.Therapy ongoing and patient noted to be limited by signifiant debility. CIR recommended due to functional deccline. Patient transferred to CIR on 05/04/2019 .   Patient currently requires max with mobility secondary to muscle weakness, decreased cardiorespiratoy endurance, impaired timing and sequencing, abnormal tone, decreased coordination and decreased motor planning, decreased initiation, decreased attention, decreased problem solving, decreased safety awareness, decreased memory and delayed processing and decreased sitting balance, decreased standing balance, decreased postural control and decreased balance strategies.  Prior to hospitalization, patient was independent  with mobility and lived with Spouse in a House home.  Home access is 1Stairs to enter.  Patient will benefit from skilled PT intervention to maximize safe functional mobility, minimize fall risk and decrease caregiver burden for planned discharge home with 24 hour assist.  Anticipate patient will benefit from follow up [facility] at discharge.  PT - End of Session Activity Tolerance: Tolerates 30+ min activity with multiple rests Endurance Deficit: Yes Endurance Deficit Description: requires several rest breaks and is limited in mobility due to fatigue with minimal activity PT Assessment Rehab Potential (ACUTE/IP ONLY): Good PT Barriers to Discharge: New oxygen PT Patient demonstrates impairments in the following area(s): Balance;Behavior;Edema;Endurance;Motor;Nutrition;Pain;Perception;Safety;Sensory;Skin Integrity PT Transfers Functional Problem(s): Bed Mobility;Bed to Chair;Car;Furniture;Floor PT Locomotion Functional Problem(s): Ambulation;Wheelchair Mobility;Stairs PT Plan PT Intensity: Minimum of 1-2 x/day ,45 to 90 minutes PT Frequency: 5 out of 7 days PT Duration Estimated Length of Stay: 2.5-3 weeks PT Treatment/Interventions: Ambulation/gait training;Discharge  planning;Functional mobility training;Psychosocial support;Therapeutic Activities;Visual/perceptual remediation/compensation;Balance/vestibular training;Disease management/prevention;Neuromuscular re-education;Skin care/wound management;Therapeutic Exercise;Wheelchair propulsion/positioning;Cognitive remediation/compensation;DME/adaptive equipment instruction;Pain management;Splinting/orthotics;UE/LE Strength taining/ROM;Community reintegration;Functional electrical stimulation;Patient/family education;Stair training;UE/LE Coordination activities PT Transfers Anticipated Outcome(s): Min A PT Locomotion Anticipated Outcome(s): Min A 50 feet PT Recommendation Follow Up Recommendations: Home health PT Patient destination: Home Equipment Recommended: To be determined  Skilled Therapeutic Intervention In addition to the PT evaluation below, the patient performed the following skilled PT interventions:  Patient in bed with his wife in room upon PT arrival. Patient alert and agreeable to PT session. Patient initially spoke english with PT at beginning of session and spoke [place] with his wife. As session progressed patient only spoke [place] with his wife translating. Patient's wife reports that he speaks english, but patient has been speaking more [place]ian since his admission. Will recommend having a translator present during future therapy sessions. Patient on 2L O2 throughout session, SPO2 >90% throughout. He reported mild low back pain in sitting and lying flat during session. Declined pain medicine. PT provided repositioning, rest breaks, and distraction as pain interventions throughout session.    Therapeutic Activity: Bed Mobility: Performed bed mobility as described below. Provided cues for pushing through opposite LE and reaching with opposite UE to roll with hand-over-hand assist for positioning.  Transfers: Patient performed sit to/from stand x1 from the bed and stand pivot x1 to the w/c with max  A using no AD, see cues and details below. He performed sit to/from stand x1 with a RW with mod A from the w/c. Provided verbal cues for hand placement to push up from arm rest, patient with significant difficulty following cues with hand-over-hand assist, and eventually using B UEs on RW to stand. He performed a transfer using the Stedy with mod A back to the bed. Stated he was very fatigued and needed to lie down.   Gait  Training:  See details below.  Wheelchair Mobility:  Patient attempted w/c propulsion, but was unable to propel the w/c due to UE weakness and fatigue from prior mobility.   Patient in bed with his wife at bedside at end of session with breaks locked, bed alarm set, and all needs within reach.   Instructed pt and his wife in results of PT evaluation as detailed below, PT POC, rehab potential, rehab goals, and discharge recommendations. Additionally discussed CIR's policies regarding fall safety and use of chair alarm and/or quick release belt. Pt and his wife verbalized understanding and in agreement.   PT Evaluation Precautions/Restrictions Precautions Precautions: Fall Restrictions Weight Bearing Restrictions: No General   Vital SignsTherapy Vitals Pulse Rate: 95 BP: 134/81 Oxygen Therapy SpO2: 94 % O2 Device: Nasal Cannula O2 Flow Rate (L/min): 2 L/min Pain Pain Assessment Pain Scale: 0-10 Pain Score: 0-No pain Home Living/Prior Functioning Home Living Available Help at Discharge: Family Type of Home: House Home Access: Stairs to enter Technical brewer of Steps: 1 Home Layout: Two level Alternate Level Stairs-Number of Steps: 10 Alternate Level Stairs-Rails: Right  Lives With: Spouse Prior Function Level of Independence: Independent with transfers;Independent with gait  Able to Take Stairs?: Yes Driving: Yes Vocation: Full time employment Vision/Perception  Vision - Assessment Additional Comments: No apprent visual deficits on evaluation,  will need to be tested further when patient is less fatigued.  Cognition Overall Cognitive Status: Impaired/Different from baseline Arousal/Alertness: Awake/alert Orientation Level: Oriented to person;Oriented to place;Disoriented to time;Disoriented to situation Attention: Selective Sustained Attention: Impaired Sustained Attention Impairment: Verbal basic;Functional basic Selective Attention: Impaired Selective Attention Impairment: Functional basic;Verbal basic Memory: Impaired Memory Impairment: Decreased recall of new information;Retrieval deficit Problem Solving: Impaired Problem Solving Impairment: Functional basic Executive Function: Self Monitoring;Self Correcting;Sequencing Sequencing: Impaired Sequencing Impairment: Functional basic Self Monitoring: Impaired Self Monitoring Impairment: Functional basic Self Correcting: Impaired Self Correcting Impairment: Functional basic Safety/Judgment: Impaired Sensation Sensation Light Touch: Appears Intact Additional Comments: Unable to formally test due to cognitive deficits, but responsive to light touch in all extremities Coordination Gross Motor Movements are Fluid and Coordinated: No Fine Motor Movements are Fluid and Coordinated: No Coordination and Movement Description: Generalized weakness and deconditioning, unable to hold a cup with fatigue during session Motor  Motor Motor: Other (comment) Motor - Skilled Clinical Observations: decreased strength and activity tolerance, will continue to assess for any additional neurological motor deficits as mobility improves  Mobility Bed Mobility Bed Mobility: Rolling Right;Rolling Left;Supine to Sit;Sit to Supine Rolling Right: Moderate Assistance - Patient 50-74% Rolling Left: Moderate Assistance - Patient 50-74% Supine to Sit: Moderate Assistance - Patient 50-74% Sit to Supine: Moderate Assistance - Patient 50-74% Transfers Transfers: Sit to Stand;Stand to Sit;Stand Pivot  Transfers;Transfer via Lift Equipment Sit to Stand: Maximal Assistance - Patient 25-49% Stand to Sit: Maximal Assistance - Patient 25-49% Stand Pivot Transfers: Maximal Assistance - Patient 25 - 49% Stand Pivot Transfer Details: Manual facilitation for weight shifting;Verbal cues for precautions/safety;Verbal cues for technique;Verbal cues for sequencing;Verbal cues for gait pattern Stand Pivot Transfer Details (indicate cue type and reason): Provided cues for leaning forward to stand, sequencing of stepping, and reaching back to sit for safety. Provided manual facilitation to weight shift forward to stand and between B LEs when stepping. Transfer (Assistive device): None Transfer via Lift Equipment: Stedy(mod A to stand, recomment +2 for safety for nursing staff, see safty sheet.) Locomotion  Gait Ambulation: Yes Gait Assistance: Maximal Assistance - Patient 25-49% Gait Distance (Feet): 2 Feet  Assistive device: None Gait Assistance Details: See stand pivot cues above, as this was the only ambulation the patient could tolerate during session due to fatigue. Gait Gait: Yes Gait Pattern: Decreased step length - left;Decreased step length - right;Step-to pattern;Decreased weight shift to right;Decreased weight shift to left;Right flexed knee in stance;Left flexed knee in stance;Decreased trunk rotation;Trunk flexed;Narrow base of support;Poor foot clearance - right;Poor foot clearance - left Gait velocity: significantly decreased Stairs / Additional Locomotion Stairs: No(decreased strength/activity tolerance) Wheelchair Mobility Wheelchair Mobility: No(Patient attempted w/c mobility, but unable to propel w/c at this time due to decreased strength/activity tolerance.)  Trunk/Postural Assessment  Cervical Assessment Cervical Assessment: Exceptions to WFL(forward head) Thoracic Assessment Thoracic Assessment: Exceptions to WFL(rounded shoulders) Lumbar Assessment Lumbar Assessment: Exceptions  to WFL(posterior pelvic tilt) Postural Control Postural Control: Deficits on evaluation(delayed/decreased)  Balance Balance Balance Assessed: Yes Static Sitting Balance Static Sitting - Balance Support: No upper extremity supported Static Sitting - Level of Assistance: 3: Mod assist Static Sitting - Comment/# of Minutes: Initially did not use UEs to support himself in sitting, provided cues for using B UE support and he was able to progress to close supervision >1 min sitting EOB Dynamic Sitting Balance Dynamic Sitting - Balance Support: Right upper extremity supported;Left upper extremity supported Dynamic Sitting - Level of Assistance: 3: Mod assist Dynamic Sitting - Balance Activities: Lateral lean/weight shifting;Forward lean/weight shifting;Reaching for objects Sitting balance - Comments: leans far forward and to the R in sitting without B UE support Static Standing Balance Static Standing - Balance Support: Bilateral upper extremity supported Static Standing - Level of Assistance: 3: Mod assist Static Standing - Comment/# of Minutes: unable to maintain standing for >30 seconds due to fatigue Dynamic Standing Balance Dynamic Standing - Balance Support: Bilateral upper extremity supported Dynamic Standing - Level of Assistance: 2: Max assist Dynamic Standing - Balance Activities: Lateral lean/weight shifting;Forward lean/weight shifting Dynamic Standing - Comments: assessed with B HHA during stand pivot transfer Extremity Assessment  RUE Assessment RUE Assessment: Exceptions to [facility] Passive Range of Motion (PROM) Comments: ~120 degrees shoulder flexion Active Range of Motion (AROM) Comments: ~90 degrees shoulder flexion General Strength Comments: Grossly in sitting: shoulder flexion 3+/5, elbow flexion/extension 4/5, grip 4/5 LUE Assessment Passive Range of Motion (PROM) Comments: ~120 degrees shoulder flexion Active Range of Motion (AROM) Comments: ~90 degrees shoulder  flexion General Strength Comments: Grossly in sitting: shoulder flexion 4-/5, elbow flexion/extension 4+/5, grip 4+/5 RLE Assessment Active Range of Motion (AROM) Comments: WFL for functional mobility General Strength Comments: Grossly at least 3+/5 unable to formally test due to fatigue and poor cognition during evaluation LLE Assessment Active Range of Motion (AROM) Comments: WFL for functional mobility General Strength Comments: Grossly at least 3+/5 unable to formally test due to fatigue and poor cognition during evaluation    Refer to Care Plan for Long Term Goals  Recommendations for other services: None   Discharge Criteria: Patient will be discharged from PT if patient refuses treatment 3 consecutive times without medical reason, if treatment goals not met, if there is a change in medical status, if patient makes no progress towards goals or if patient is discharged from hospital.  The above assessment, treatment plan, treatment alternatives and goals were discussed and mutually agreed upon: by patient and by family  Doreene Burke PT, DPT  05/05/2019, 1:02 PM

## 2019-05-05 NOTE — Progress Notes (Signed)
Nutrition Consult Brief Note  Consult received for diet education towards the end of next week with family. Unit RD to follow-up next week for diet education.  Molli Barrows, RD, LDN, [place] Pager [phone] After Hours Pager [phone]

## 2019-05-06 LAB — COMPREHENSIVE METABOLIC PANEL
ALT: 57 U/L — ABNORMAL HIGH (ref 0–44)
AST: 23 U/L (ref 15–41)
Albumin: 2.4 g/dL — ABNORMAL LOW (ref 3.5–5.0)
Alkaline Phosphatase: 80 U/L (ref 38–126)
Anion gap: 12 (ref 5–15)
BUN: 105 mg/dL — ABNORMAL HIGH (ref 8–23)
CO2: 20 mmol/L — ABNORMAL LOW (ref 22–32)
Calcium: 8.8 mg/dL — ABNORMAL LOW (ref 8.9–10.3)
Chloride: 107 mmol/L (ref 98–111)
Creatinine, Ser: 2.92 mg/dL — ABNORMAL HIGH (ref 0.61–1.24)
GFR calc Af Amer: 24 mL/min — ABNORMAL LOW (ref >60)
GFR calc non Af Amer: 21 mL/min — ABNORMAL LOW (ref >60)
Glucose, Bld: 198 mg/dL — ABNORMAL HIGH (ref 70–99)
Potassium: 5.6 mmol/L — ABNORMAL HIGH (ref 3.5–5.1)
Sodium: 139 mmol/L (ref 135–145)
Total Bilirubin: 1 mg/dL (ref 0.3–1.2)
Total Protein: 6.6 g/dL (ref 6.5–8.1)

## 2019-05-06 LAB — CBC WITH DIFFERENTIAL/PLATELET
Abs Immature Granulocytes: 0.14 10*3/uL — ABNORMAL HIGH (ref 0.00–0.07)
Basophils Absolute: 0 10*3/uL (ref 0.0–0.1)
Basophils Relative: 0 %
Eosinophils Absolute: 0 10*3/uL (ref 0.0–0.5)
Eosinophils Relative: 0 %
HCT: 29 % — ABNORMAL LOW (ref 39.0–52.0)
Hemoglobin: 9.1 g/dL — ABNORMAL LOW (ref 13.0–17.0)
Immature Granulocytes: 1 %
Lymphocytes Relative: 7 %
Lymphs Abs: 1.2 10*3/uL (ref 0.7–4.0)
MCH: 30.2 pg (ref 26.0–34.0)
MCHC: 31.4 g/dL (ref 30.0–36.0)
MCV: 96.3 fL (ref 80.0–100.0)
Monocytes Absolute: 1.3 10*3/uL — ABNORMAL HIGH (ref 0.1–1.0)
Monocytes Relative: 7 %
Neutro Abs: 14.3 10*3/uL — ABNORMAL HIGH (ref 1.7–7.7)
Neutrophils Relative %: 85 %
Platelets: 865 10*3/uL — ABNORMAL HIGH (ref 150–400)
RBC: 3.01 MIL/uL — ABNORMAL LOW (ref 4.22–5.81)
RDW: 15.9 % — ABNORMAL HIGH (ref 11.5–15.5)
WBC: 17 10*3/uL — ABNORMAL HIGH (ref 4.0–10.5)
nRBC: 0 % (ref 0.0–0.2)

## 2019-05-06 LAB — BASIC METABOLIC PANEL
Anion gap: 9 (ref 5–15)
BUN: 100 mg/dL — ABNORMAL HIGH (ref 8–23)
CO2: 23 mmol/L (ref 22–32)
Calcium: 8.8 mg/dL — ABNORMAL LOW (ref 8.9–10.3)
Chloride: 107 mmol/L (ref 98–111)
Creatinine, Ser: 2.72 mg/dL — ABNORMAL HIGH (ref 0.61–1.24)
GFR calc Af Amer: 27 mL/min — ABNORMAL LOW (ref >60)
GFR calc non Af Amer: 23 mL/min — ABNORMAL LOW (ref >60)
Glucose, Bld: 145 mg/dL — ABNORMAL HIGH (ref 70–99)
Potassium: 5.2 mmol/L — ABNORMAL HIGH (ref 3.5–5.1)
Sodium: 139 mmol/L (ref 135–145)

## 2019-05-06 LAB — COOXEMETRY PANEL
Carboxyhemoglobin: 1.8 % — ABNORMAL HIGH (ref 0.5–1.5)
Methemoglobin: 0.9 % (ref 0.0–1.5)
O2 Saturation: 56.7 %
Total hemoglobin: 9.3 g/dL — ABNORMAL LOW (ref 12.0–16.0)

## 2019-05-06 LAB — GLUCOSE, CAPILLARY
Glucose-Capillary: 117 mg/dL — ABNORMAL HIGH (ref 70–99)
Glucose-Capillary: 246 mg/dL — ABNORMAL HIGH (ref 70–99)

## 2019-05-06 LAB — CK: Total CK: 21 U/L — ABNORMAL LOW (ref 49–397)

## 2019-05-06 NOTE — Progress Notes (Signed)
Patient ID: Lawrence Olson, male   DOB: 1950/11/06, 68 y.o.   [name] [id number]   Advanced Heart Failure Team  Note   Primary Physician: Patient, No Pcp Per PCP-Cardiologist:  No primary care provider on file.  HPI:    Moved to CIR on 9/11  He continues to feel stronger. No CP or SOB. Renal function improving. K was 5.6 last night 5.2 on repeat this am   BUN/CR 111/3.20 -> 110/3.10 -> 100/2.7  Echo 04/24/19 EF 25-30% no LV thrombus   Objective:    Vital Signs:   Temp:  [97.5 F (36.4 C)-98.3 F (36.8 C)] 97.5 F (36.4 C) (09/13 0349) Pulse Rate:  [71-75] 71 (09/13 0828) Resp:  [17-18] 18 (09/13 0349) BP: (128-147)/(78-82) 130/78 (09/13 0828) SpO2:  [93 %-98 %] 94 % (09/13 0349) Weight:  [80.9 kg] 80.9 kg (09/13 0349) Last BM Date: 05/02/19  Weight change: Filed Weights   05/05/19 0246 05/05/19 0257 05/06/19 0349  Weight: 80.9 kg 80.4 kg 80.9 kg    Intake/Output:   Intake/Output Summary (Last 24 hours) at 05/06/2019 0959 Last data filed at 05/06/2019 0700 Gross per 24 hour  Intake 840 ml  Output 2700 ml  Net -1860 ml      Physical Exam   General:  Weak  appearing. No resp difficulty HEENT: normal Neck: supple. no JVD  RIJ trialysis Carotids 2+ bilat; no bruits. No lymphadenopathy or thryomegaly appreciated. Cor: PMI nondisplaced. Regular rate & rhythm. No rubs, gallops or murmurs. Lungs: clear Abdomen: soft, nontender, nondistended. No hepatosplenomegaly. No bruits or masses. Good bowel sounds. Extremities: no cyanosis, clubbing, rash, edema Neuro: alert & orientedx3, cranial nerves grossly intact. moves all 4 extremities w/o difficulty. Affect pleasant    Telemetry   NSR 70-80s Personally reviewed  Labs   Basic Metabolic Panel: Recent Labs  Lab 04/30/19 0258 04/30/19 1600 05/01/19 0320  05/02/19 0421 05/03/19 0440 05/04/19 0521 05/05/19 2357 05/06/19 0805  NA 136 136 134*   < > 137 143 144 139 139  K 5.7* 4.4 4.1   < > 3.9 4.3 4.2 5.6* 5.2*  CL  101 102 101  --  102 108 110 107 107  CO2 24 25 25   --  24 23 24  20* 23  GLUCOSE 284* 133* 172*  --  157* 149* 86 198* 145*  BUN 48* 48* 49*  --  87* 111* 110* 105* 100*  CREATININE 2.00* 1.83* 1.82*  --  3.02* 3.20* 3.10* 2.92* 2.72*  CALCIUM 7.9* 8.0* 7.6*  --  8.1* 8.3* 8.4* 8.8* 8.8*  MG 3.0*  --  2.9*  --   --   --   --   --   --   PHOS 5.8* 4.6 4.7*  --  6.4* 7.9* 6.1*  --   --    < > = values in this interval not displayed.    Liver Function Tests: Recent Labs  Lab 05/01/19 0320 05/02/19 0421 05/03/19 0440 05/04/19 0521 05/05/19 2357  AST  --   --   --   --  23  ALT  --   --   --   --  57*  ALKPHOS  --   --   --   --  80  BILITOT  --   --   --   --  1.0  PROT  --   --   --   --  6.6  ALBUMIN 1.9* 2.2* 2.2* 2.3* 2.4*   No results for input(s):  LIPASE, AMYLASE in the last 168 hours. No results for input(s): AMMONIA in the last 168 hours.  CBC: Recent Labs  Lab 04/30/19 0258 05/01/19 0320 05/01/19 3710 05/02/19 0421 05/04/19 0521 05/05/19 2357  WBC 27.4* 34.5*  --  33.0* 23.8* 17.0*  NEUTROABS  --   --   --   --   --  14.3*  HGB 8.5* 8.1* 9.2* 8.4* 8.5* 9.1*  HCT 27.7* 25.9* 27.0* 26.8* 26.5* 29.0*  MCV 98.9 99.6  --  98.5 96.7 96.3  PLT 667* 699*  --  892* 896* 865*    Cardiac Enzymes: Recent Labs  Lab 05/05/19 2357  CKTOTAL 21*    BNP: BNP (last 3 results) No results for input(s): BNP in the last 8760 hours.  ProBNP (last 3 results) No results for input(s): PROBNP in the last 8760 hours.   CBG: Recent Labs  Lab 05/04/19 1555 05/04/19 2034 05/05/19 0624 05/05/19 2045 05/06/19 0530  GLUCAP 307* 284* 148* 247* 117*    Coagulation Studies: No results for input(s): LABPROT, INR in the last 72 hours.   Imaging   No results found.   Medications:     Current Medications: . aspirin  81 mg Oral Daily  . atorvastatin  80 mg Oral q1800  . B-complex with vitamin C  1 tablet Oral Daily  . carvedilol  3.125 mg Oral BID WC  .  chlorhexidine  15 mL Mouth Rinse BID  . Chlorhexidine Gluconate Cloth  6 each Topical Daily  . famotidine  20 mg Oral Daily  . feeding supplement (PRO-STAT SUGAR FREE 64)  30 mL Oral BID  . heparin  5,000 Units Subcutaneous Q8H  . insulin detemir  5 Units Subcutaneous Q12H  . mouth rinse  15 mL Mouth Rinse q12n4p  . Ensure Max Protein  11 oz Oral Daily  . QUEtiapine  50 mg Oral BID  . sodium chloride flush  10-40 mL Intracatheter Q12H  . tamsulosin  0.4 mg Oral QPC supper    Infusions:   Assessment/Plan   1. CAD: Anterolateral STEMI, cath 8/24 showed occluded proximal LAD, unable to open.  Once we see how he recovers neurologically, will need to decide on feasibility of re-attempt PCI versus CABG. Medical therapy for the forseeable future. - No s/s ischemia - Continue medical therapy with ASA + statin.  - Continue low-dose carvedilol    2. Acute systolic HF - Echo with EF 25%, normal RV.  Impella removed 8/28.  Milrinone stopped 9/6.  - Co-ox 71 -> 57% will need to watch closely - No ACE/ARB/ARNI with AKI - On low dose carvedilol  - Volume status ok. Renal function continues to recover.  Will pull HD cath. Can give Lokelma for hyperkalemia as needed - [place] hyrdal nitrates over next few days but do not want to drop BP too much in setting of recovering kidney function  (possible start tomorrow)  3. AKI:  -Suspect hemodynamic (ATN)   - CVVHD started 9/3 - Volume status ok. Creatinine improving slowly   - Renal function continues to recover.  Will pull HD cath. Can give Lokelma for hyperkalemia as needed  4. Cardiac arrest/VF: 20 minutes downtime, concerned for anoxic brain injury.  Neurology following, per yesterday's note he seems to have intact brainstem function but minimal cortical function. EEG suggestive of diffuse encephalopathy. CT head without definite evidence for anoxic encephalopathy.  MRI without convincing evidence of anoxic injury - Mental status much improved  - Continue rehab.  -  Will need LifeVest once strong enough to push alert deactivation button. Hopefully we can place on Monday - Ok to be on CIR without LifeVest or continuous monitoring. He has been in hospital for 2 weeks with no recurrent VT/VF so risk for SCD felt to be low.  - Keep K > 4.0/Mg 2.0  5. Deconditioning - very weak. ? ICU myopathy - Appreciate CIR - CK level ok   6. Possible pneumonitis  - stop prednisone after Mondays dose.    Glori Bickers MD 05/06/2019 9:59 AM

## 2019-05-06 NOTE — Progress Notes (Signed)
[facility]    NEPHROLOGY PROGRESS NOTE  SUBJECTIVE: Patient seen and examined.  Working with physical therapy and appears quite fatigued.     OBJECTIVE:  Vitals:   05/06/19 0349 05/06/19 0828  BP: 128/82 130/78  Pulse: 75 71  Resp: 18   Temp: (!) 97.5 F (36.4 C)   SpO2: 94%     Intake/Output Summary (Last 24 hours) at 05/06/2019 1258 Last data filed at 05/06/2019 1212 Gross per 24 hour  Intake 840 ml  Output 3300 ml  Net -2460 ml      General: resting, NAD HEENT: MMM [place] AT anicteric sclera Neck:  No JVD, no adenopathy CV:  Heart RRR  Lungs:  L/S CTA bilaterally Abd:  abd SNT/ND with normal BS GU:  Bladder non-palpable Extremities: no bilateral LE edema. Skin:  No skin rash  MEDICATIONS:  . aspirin  81 mg Oral Daily  . atorvastatin  80 mg Oral q1800  . B-complex with vitamin C  1 tablet Oral Daily  . carvedilol  3.125 mg Oral BID WC  . chlorhexidine  15 mL Mouth Rinse BID  . Chlorhexidine Gluconate Cloth  6 each Topical Daily  . famotidine  20 mg Oral Daily  . feeding supplement (PRO-STAT SUGAR FREE 64)  30 mL Oral BID  . heparin  5,000 Units Subcutaneous Q8H  . insulin detemir  5 Units Subcutaneous Q12H  . mouth rinse  15 mL Mouth Rinse q12n4p  . Ensure Max Protein  11 oz Oral Daily  . QUEtiapine  50 mg Oral BID  . sodium chloride flush  10-40 mL Intracatheter Q12H  . tamsulosin  0.4 mg Oral QPC supper       LABS:   CBC Latest Ref Rng & Units 05/05/2019 05/04/2019 05/02/2019  WBC 4.0 - 10.5 K/uL 17.0(H) 23.8(H) 33.0(H)  Hemoglobin 13.0 - 17.0 g/dL 9.1(L) 8.5(L) 8.4(L)  Hematocrit 39.0 - 52.0 % 29.0(L) 26.5(L) 26.8(L)  Platelets 150 - 400 K/uL 865(H) 896(H) 892(H)    CMP Latest Ref Rng & Units 05/06/2019 05/05/2019 05/04/2019  Glucose 70 - 99 mg/dL 145(H) 198(H) 86  BUN 8 - 23 mg/dL 100(H) 105(H) 110(H)  Creatinine 0.61 - 1.24 mg/dL 2.72(H) 2.92(H) 3.10(H)  Sodium 135 - 145 mmol/L 139 139 144  Potassium 3.5 - 5.1 mmol/L 5.2(H) 5.6(H)  4.2  Chloride 98 - 111 mmol/L 107 107 110  CO2 22 - 32 mmol/L 23 20(L) 24  Calcium 8.9 - 10.3 mg/dL 8.8(L) 8.8(L) 8.4(L)  Total Protein 6.5 - 8.1 g/dL - 6.6 -  Total Bilirubin 0.3 - 1.2 mg/dL - 1.0 -  Alkaline Phos 38 - 126 U/L - 80 -  AST 15 - 41 U/L - 23 -  ALT 0 - 44 U/L - 57(H) -    Lab Results  Component Value Date   CALCIUM 8.8 (L) 05/06/2019   CAION 1.12 (L) 05/01/2019   PHOS 6.1 (H) 05/04/2019       Component Value Date/Time   COLORURINE YELLOW 04/26/2019 1136   COLORURINE YELLOW 04/26/2019 1136   APPEARANCEUR CLOUDY (A) 04/26/2019 1136   APPEARANCEUR CLEAR 04/26/2019 1136   LABSPEC 1.011 04/26/2019 1136   LABSPEC 1.020 04/26/2019 1136   PHURINE 5.0 04/26/2019 1136   PHURINE 5.5 04/26/2019 1136   GLUCOSEU NEGATIVE 04/26/2019 1136   GLUCOSEU NEGATIVE 04/26/2019 1136   HGBUR MODERATE (A) 04/26/2019 1136   HGBUR MODERATE (A) 04/26/2019 1136   BILIRUBINUR NEGATIVE 04/26/2019 [place] 04/26/2019 1136   KETONESUR  NEGATIVE 04/26/2019 [place] 04/26/2019 1136   PROTEINUR NEGATIVE 04/26/2019 1136   PROTEINUR NEGATIVE 04/26/2019 1136   NITRITE NEGATIVE 04/26/2019 1136   NITRITE NEGATIVE 04/26/2019 1136   LEUKOCYTESUR NEGATIVE 04/26/2019 1136   LEUKOCYTESUR TRACE (A) 04/26/2019 1136      Component Value Date/Time   PHART 7.356 05/01/2019 0611   PCO2ART 46.4 05/01/2019 0611   PO2ART 107.0 05/01/2019 0611   HCO3 26.1 05/01/2019 0611   TCO2 28 05/01/2019 0611   ACIDBASEDEF 5.0 (H) 04/17/2019 0620   O2SAT 56.7 05/06/2019 0400    No results found for: IRON, TIBC, FERRITIN, IRONPCTSAT     ASSESSMENT/PLAN:     1. Nonoliguric dialysis dependent ischemic ATN, requiring CRRT 9/3 to 9/8: Appears to be recovering GFR.  No strong indication for intermittent HD today.  Azotemia reflects low GFR and catabolic effect of steroids.  He had a fairly normal creatinine at the time of presentation.  Remove temp cath today.   2. Status post cardiac  arrest, STEMI with lesion not amenable to PCI, ischemic cardiomyopathy with systolic heart failure and shock. As above. Advanced heart failure following.  Much improved. 3. 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 Anemia, hemoglobin stable 5. Hyperphosphatemia.  Should improve with recovering renal function.    [place], DO, [place]

## 2019-05-06 NOTE — Progress Notes (Signed)
[place] PHYSICAL MEDICINE & REHABILITATION PROGRESS NOTE   Subjective/Complaints: Tired after eating breakfast , resting ROS- neg CP, SOB, N/V/D  Objective:   No results found. Recent Labs    05/04/19 0521 05/05/19 2357  WBC 23.8* 17.0*  HGB 8.5* 9.1*  HCT 26.5* 29.0*  PLT 896* 865*   Recent Labs    05/04/19 0521 05/05/19 2357  NA 144 139  K 4.2 5.6*  CL 110 107  CO2 24 20*  GLUCOSE 86 198*  BUN 110* 105*  CREATININE 3.10* 2.92*  CALCIUM 8.4* 8.8*    Intake/Output Summary (Last 24 hours) at 05/06/2019 0844 Last data filed at 05/06/2019 0700 Gross per 24 hour  Intake 840 ml  Output 2700 ml  Net -1860 ml     Physical Exam: Vital Signs Blood pressure 130/78, pulse 71, temperature (!) 97.5 F (36.4 C), resp. rate 18, weight 80.9 kg, SpO2 94 %.   General: No acute distress Mood and affect are appropriate Heart: Regular rate and rhythm no rubs murmurs or extra sounds Lungs: Clear to auscultation, breathing unlabored, no rales or wheezes Abdomen: Positive bowel sounds, soft nontender to palpation, nondistended Extremities: No clubbing, cyanosis, or edema Skin: No evidence of breakdown, no evidence of rash Neurologic: Cranial nerves II through XII intact, motor strength is 5/5 in bilateral deltoid, bicep, tricep, grip, hip flexor, knee extensors, ankle dorsiflexor and plantar flexor Sensory exam normal sensation to light touch and proprioception in bilateral upper and lower extremities Cerebellar exam normal finger to nose to finger as well as heel to shin in bilateral upper and lower extremities Musculoskeletal: Full range of motion in all 4 extremities. No joint swelling   Assessment/Plan: 1. 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  Physiatrist is providing close team supervision and 24 hour management of active medical problems  listed below.  Physiatrist and rehab team continue to assess barriers to discharge/monitor patient progress toward functional and medical goals  Care Tool:  Bathing  Bathing activity did not occur: Safety/medical concerns Body parts bathed by patient: Right arm, Left arm, Chest, Abdomen, Face   Body parts bathed by helper: Front perineal area, Buttocks, Right upper leg, Left upper leg, Right lower leg, Left lower leg     Bathing assist Assist Level: Moderate Assistance - Patient 50 - 74%     Upper Body Dressing/Undressing Upper body dressing   What is the patient wearing?: Pull over shirt    Upper body assist Assist Level: Total Assistance - Patient < 25%    Lower Body Dressing/Undressing Lower body dressing      What is the patient wearing?: Incontinence brief     Lower body assist Assist for lower body dressing: Total Assistance - Patient < 25%     Toileting Toileting    Toileting assist Assist for toileting: Maximal Assistance - Patient 25 - 49%     Transfers Chair/bed transfer  Transfers assist     Chair/bed transfer assist level: Maximal Assistance - Patient 25 - 49%     Locomotion Ambulation   Ambulation assist      Assist level: Maximal Assistance - Patient 25 - 49% Assistive device: No Device Max distance: 2 feet   Walk 10 feet activity   Assist  Walk 10 feet activity did not occur: Safety/medical concerns(decreased strength/activity tolerance)        Walk 50 feet activity   Assist Walk  50 feet with 2 turns activity did not occur: Safety/medical concerns(decreased strength/activity tolerance)         Walk 150 feet activity   Assist Walk 150 feet activity did not occur: Safety/medical concerns(decreased strength/activity tolerance)         Walk 10 feet on uneven surface  activity   Assist Walk 10 feet on uneven surfaces activity did not occur: Safety/medical concerns(decreased strength/activity tolerance)          Wheelchair     Assist     Wheelchair activity did not occur: Safety/medical concerns(decreased strength/activity tolerance)         Wheelchair 50 feet with 2 turns activity    Assist    Wheelchair 50 feet with 2 turns activity did not occur: Safety/medical concerns(decreased strength/activity tolerance)       Wheelchair 150 feet activity     Assist  Wheelchair 150 feet activity did not occur: Safety/medical concerns(decreased strength/activity tolerance)       Blood pressure 130/78, pulse 71, temperature (!) 97.5 F (36.4 C), resp. rate 18, weight 80.9 kg, SpO2 94 %.  Medical Problem List and Plan: 1.Debility and anoxic encephalopathysecondary to VT cardiac arrest -CIR PT, OT, SLP- needs to build up hand strength  2. Antithrombotics: -DVT/anticoagulation:Pharmaceutical:Heparin -antiplatelet therapy: ASA 3. Pain Management:N/A 4. Mood:LCSW to follow for evaluation and support. Does not have full awareness of deficits. -antipsychotic agents: On Seroquel bid 5. Neuropsych: This patientis notcapable of making decisions on hisown behalf. 6. Skin/Wound Care:Routine pressure relief measures 7. Fluids/Electrolytes/Nutrition:strict I/O. Check lytes in am.  8.CAD with STEMI/VTarrest: Treated medically for now.  -LifeVest orderedand to be fitted tomorrow morning per cardiology. Have communicated with Dr Sung Amabile Cardiology is aware that there is NOT continuous monitoring on inpatient rehab as we await lifevest. 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 Cr around 3.1 -nephrology following -daily labs 12. Urine retention: --monitorbladder emptyingwith PVR checks.  On  Flomax for urinary retention?  13. Acute blood loss anemia: Recheck in a.m. H&H stable 14.Combined heart failure/ischemic cardiomyopathy: Monitor for signs of overload and check weights daily. On Coreg twice daily. No ACE/arms or BB due to AKI and shock 15.Leukocytosis/Thrombocytosis: Afebrile off antibiotics.Improving and felt to bereactive/secondary to steroids. Monitor for signs of infection.  16.  Constipation adjust laxative  LOS: 2 days A FACE TO FACE EVALUATION WAS PERFORMED  Charlett Blake 05/06/2019, 8:44 AM

## 2019-05-07 ENCOUNTER — Inpatient Hospital Stay (HOSPITAL_COMMUNITY): Payer: PPO | Admitting: Speech Pathology

## 2019-05-07 ENCOUNTER — Inpatient Hospital Stay (HOSPITAL_COMMUNITY): Payer: PPO

## 2019-05-07 ENCOUNTER — Other Ambulatory Visit (HOSPITAL_COMMUNITY): Payer: Self-pay | Admitting: *Deleted

## 2019-05-07 DIAGNOSIS — N170 Acute kidney failure with tubular necrosis: Secondary | ICD-10-CM

## 2019-05-07 DIAGNOSIS — K5901 Slow transit constipation: Secondary | ICD-10-CM

## 2019-05-07 DIAGNOSIS — I2102 ST elevation (STEMI) myocardial infarction involving left anterior descending coronary artery: Secondary | ICD-10-CM

## 2019-05-07 LAB — BASIC METABOLIC PANEL
Anion gap: 10 (ref 5–15)
BUN: 96 mg/dL — ABNORMAL HIGH (ref 8–23)
CO2: 24 mmol/L (ref 22–32)
Calcium: 8.9 mg/dL (ref 8.9–10.3)
Chloride: 105 mmol/L (ref 98–111)
Creatinine, Ser: 2.7 mg/dL — ABNORMAL HIGH (ref 0.61–1.24)
GFR calc Af Amer: 27 mL/min — ABNORMAL LOW (ref >60)
GFR calc non Af Amer: 23 mL/min — ABNORMAL LOW (ref >60)
Glucose, Bld: 145 mg/dL — ABNORMAL HIGH (ref 70–99)
Potassium: 5.5 mmol/L — ABNORMAL HIGH (ref 3.5–5.1)
Sodium: 139 mmol/L (ref 135–145)

## 2019-05-07 LAB — GLUCOSE, CAPILLARY
Glucose-Capillary: 121 mg/dL — ABNORMAL HIGH (ref 70–99)
Glucose-Capillary: 238 mg/dL — ABNORMAL HIGH (ref 70–99)
Glucose-Capillary: 179 mg/dL — ABNORMAL HIGH (ref 70–99)
Glucose-Capillary: 161 mg/dL — ABNORMAL HIGH (ref 70–99)

## 2019-05-07 LAB — CULTURE, BLOOD (ROUTINE X 2)
Culture: NO GROWTH
Culture: NO GROWTH

## 2019-05-07 LAB — CBC
HCT: 32 % — ABNORMAL LOW (ref 39.0–52.0)
Hemoglobin: 9.9 g/dL — ABNORMAL LOW (ref 13.0–17.0)
MCH: 30.3 pg (ref 26.0–34.0)
MCHC: 30.9 g/dL (ref 30.0–36.0)
MCV: 97.9 fL (ref 80.0–100.0)
Platelets: 850 10*3/uL — ABNORMAL HIGH (ref 150–400)
RBC: 3.27 MIL/uL — ABNORMAL LOW (ref 4.22–5.81)
RDW: 15.6 % — ABNORMAL HIGH (ref 11.5–15.5)
WBC: 19.4 10*3/uL — ABNORMAL HIGH (ref 4.0–10.5)
nRBC: 0 % (ref 0.0–0.2)

## 2019-05-07 LAB — ALDOLASE: Aldolase: 4.5 U/L (ref 3.3–10.3)

## 2019-05-07 MED ORDER — POLYETHYLENE GLYCOL 3350 17 G PO PACK
17.0000 g | PACK | Freq: Every day | ORAL | Status: DC
  Administered 2019-05-07 – 2019-05-16 (×9): 17 g via ORAL
  Filled 2019-05-07 (×10): qty 1

## 2019-05-07 MED ORDER — QUETIAPINE FUMARATE 50 MG PO TABS
50.0000 mg | ORAL_TABLET | Freq: Every day | ORAL | Status: DC
  Filled 2019-05-07: qty 1

## 2019-05-07 MED ORDER — SODIUM ZIRCONIUM CYCLOSILICATE 5 G PO PACK
5.0000 g | PACK | Freq: Once | ORAL | Status: AC
  Administered 2019-05-07: 5 g via ORAL
  Filled 2019-05-07: qty 1

## 2019-05-07 NOTE — Progress Notes (Signed)
Patient ID: Lawrence Olson, male   DOB: [birth year], 68 y.o.   [name] [id number]   Advanced Heart Failure Team  Note   Primary Physician: Patient, No Pcp Per PCP-Cardiologist:  No primary care provider on file.  HPI:    Moved to CIR on 9/11  BUN/CR 111/3.20 -> 110/3.10 -> 100/2.7->100/2.7   Echo 04/24/19 EF 25-30% no LV thrombus  Complaining of fatigue. Only able to stand 30 seconds. Denies SOB.   Objective:    Vital Signs:   Temp:  [97.6 F (36.4 C)-98.1 F (36.7 C)] 98.1 F (36.7 C) (09/14 0302) Pulse Rate:  [66-76] 66 (09/14 0302) Resp:  [16-17] 16 (09/14 0302) BP: (135-142)/(81-83) 135/83 (09/14 0302) SpO2:  [95 %-97 %] 97 % (09/14 0302) Weight:  [81.1 kg] 81.1 kg (09/14 0302) Last BM Date: 05/06/19  Weight change: Filed Weights   05/05/19 0257 05/06/19 0349 05/07/19 0302  Weight: 80.4 kg 80.9 kg 81.1 kg    Intake/Output:   Intake/Output Summary (Last 24 hours) at 05/07/2019 1355 Last data filed at 05/07/2019 0809 Gross per 24 hour  Intake 480 ml  Output 2900 ml  Net -2420 ml      Physical Exam   General:  Appears weak.  No resp difficulty. In the wheel chair in gym  HEENT: normal anicteric  Neck: supple. no JVD. Carotids 2+ bilat; no bruits. No lymphadenopathy or thryomegaly appreciated. Cor: PMI nondisplaced. Regular rate & rhythm. No rubs, gallops or murmurs. Lungs: clear no wheeze Abdomen: soft, nontender, nondistended. No hepatosplenomegaly. No bruits or masses. Good bowel sounds. Extremities: no cyanosis, clubbing, rash, edema Neuro: alert & oriented x 3, cranial nerves grossly intact. moves all 4 extremities but very weak. Affect pleasant  NOT on Telemetry   Labs   Basic Metabolic Panel: Recent Labs  Lab 04/30/19 1600 05/01/19 0320  05/02/19 0421 05/03/19 0440 05/04/19 0521 05/05/19 2357 05/06/19 0805 05/07/19 0756  NA 136 134*   < > 137 143 144 139 139 139  K 4.4 4.1   < > 3.9 4.3 4.2 5.6* 5.2* 5.5*  CL 102 101  --  102 108 110 107 107  105  CO2 25 25  --  24 23 24  20* 23 24  GLUCOSE 133* 172*  --  157* 149* 86 198* 145* 145*  BUN 48* 49*  --  87* 111* 110* 105* 100* 96*  CREATININE 1.83* 1.82*  --  3.02* 3.20* 3.10* 2.92* 2.72* 2.70*  CALCIUM 8.0* 7.6*  --  8.1* 8.3* 8.4* 8.8* 8.8* 8.9  MG  --  2.9*  --   --   --   --   --   --   --   PHOS 4.6 4.7*  --  6.4* 7.9* 6.1*  --   --   --    < > = values in this interval not displayed.    Liver Function Tests: Recent Labs  Lab 05/01/19 0320 05/02/19 0421 05/03/19 0440 05/04/19 0521 05/05/19 2357  AST  --   --   --   --  23  ALT  --   --   --   --  57*  ALKPHOS  --   --   --   --  80  BILITOT  --   --   --   --  1.0  PROT  --   --   --   --  6.6  ALBUMIN 1.9* 2.2* 2.2* 2.3* 2.4*   No results for input(s): LIPASE,  AMYLASE in the last 168 hours. No results for input(s): AMMONIA in the last 168 hours.  CBC: Recent Labs  Lab 05/01/19 0320 05/01/19 7106 05/02/19 0421 05/04/19 0521 05/05/19 2357 05/07/19 0756  WBC 34.5*  --  33.0* 23.8* 17.0* 19.4*  NEUTROABS  --   --   --   --  14.3*  --   HGB 8.1* 9.2* 8.4* 8.5* 9.1* 9.9*  HCT 25.9* 27.0* 26.8* 26.5* 29.0* 32.0*  MCV 99.6  --  98.5 96.7 96.3 97.9  PLT 699*  --  892* 896* 865* 850*    Cardiac Enzymes: Recent Labs  Lab 05/05/19 2357  CKTOTAL 21*    BNP: BNP (last 3 results) No results for input(s): BNP in the last 8760 hours.  ProBNP (last 3 results) No results for input(s): PROBNP in the last 8760 hours.   CBG: Recent Labs  Lab 05/05/19 2045 05/06/19 0530 05/06/19 2127 05/07/19 0623 05/07/19 1152  GLUCAP 247* 117* 246* 121* 238*    Coagulation Studies: No results for input(s): LABPROT, INR in the last 72 hours.   Imaging   No results found.   Medications:     Current Medications: . aspirin  81 mg Oral Daily  . atorvastatin  80 mg Oral q1800  . B-complex with vitamin C  1 tablet Oral Daily  . carvedilol  3.125 mg Oral BID WC  . chlorhexidine  15 mL Mouth Rinse BID  .  Chlorhexidine Gluconate Cloth  6 each Topical Daily  . famotidine  20 mg Oral Daily  . feeding supplement (PRO-STAT SUGAR FREE 64)  30 mL Oral BID  . heparin  5,000 Units Subcutaneous Q8H  . insulin detemir  5 Units Subcutaneous Q12H  . mouth rinse  15 mL Mouth Rinse q12n4p  . polyethylene glycol  17 g Oral Daily  . Ensure Max Protein  11 oz Oral Daily  . [START ON 05/08/2019] QUEtiapine  50 mg Oral QHS  . sodium chloride flush  10-40 mL Intracatheter Q12H  . tamsulosin  0.4 mg Oral QPC supper    Infusions:   Assessment/Plan   1. CAD: Anterolateral STEMI, cath 8/24 showed occluded proximal LAD, unable to open.  Once we see how he recovers neurologically, will need to decide on feasibility of re-attempt PCI versus CABG. Medical therapy for the forseeable future. - No chest pain  - Continue medical therapy with ASA + statin.  - Continue low-dose carvedilol   2. Acute systolic HF - Echo with EF 25%, normal RV.  Impella removed 8/28.  Milrinone stopped 9/6.  - No ACE/ARB/ARNI with AKI - On low dose carvedilol  - Volume status stable.  -Renal function continues to recover.   - [place] hyrdal nitrates over next few days but do not want to drop BP too much in setting of recovering kidney function  (possible start tomorrow)  3. AKI:  -Suspect hemodynamic (ATN)   - CVVHD started 9/3 - Creatinine BUN 96 Creatinine 2.7  -  Can give Lokelma for hyperkalemia as needed  4. Cardiac arrest/VF: 20 minutes downtime, concerned for anoxic brain injury.  Neurology following, per yesterday's note he seems to have intact brainstem function but minimal cortical function. EEG suggestive of diffuse encephalopathy. CT head without definite evidence for anoxic encephalopathy.  MRI without convincing evidence of anoxic injury - Mental status much improved - Continue rehab.  - Will need LifeVest once strong enough to push alert deactivation button. Hopefully we can place on Monday - Ok to  be on CIR  without LifeVest or continuous monitoring. He has been in hospital for 2 weeks with no recurrent VT/VF so risk for SCD felt to be low.  - Life Vest representative called to fit today. .  - Keep K > 4.0/Mg 2.0  5. Deconditioning - very weak. ? ICU myopathy - Appreciate CIR. Severe deconditioning.   - CK level ok   6. Possible pneumonitis  - stop prednisone today.   7. Hyperkalemia  K 5.5 Give lokelma. Check BMET in am.    CIR appreciated. Life Vest rep called to fit his Life Vest.   Amy Clegg NP-C  05/07/2019 1:55 PM  Patient seen and examined with Darrick Grinder, NP. We discussed all aspects of the encounter. I agree with the assessment and plan as stated above.   Denies CP or SOB. Very weak. Wife says he is not eating well. K up slightly again.  Agree with lokelma. Will see if we can place LifeVest today if e is strong enough to push buttons.   Can we consider appetite stimulant like marinol?  Glori Bickers, MD  1:57 PM

## 2019-05-07 NOTE — Progress Notes (Signed)
Inpatient Rehabilitation  Patient information reviewed and entered into eRehab system by Quavion Boule M. Clarabel Marion, M.A., CCC/SLP, PPS Coordinator.  Information including medical coding, functional ability and quality indicators will be reviewed and updated through discharge.    

## 2019-05-07 NOTE — Progress Notes (Signed)
During admission, pt not well enough for CR services. Now in CIR. Referred to [facility] [place] due to MI. Will call and discuss when he makes improvement. Yves Dill CES, ACSM 7:19 AM 05/07/2019

## 2019-05-07 NOTE — Progress Notes (Signed)
Occupational Therapy Session Note  Patient Details  Name: Lawrence Olson [name] [id number] Date of Birth: July 28, 1951  Today's Date: 05/07/2019 OT Individual Time: 0488-8916 OT Individual Time Calculation (min): 60 min    Short Term Goals: Week 1:  OT Short Term Goal 1 (Week 1): Pt will complete bathing with mod assist at sit > stand level with mod cues for initiation/sequencing OT Short Term Goal 2 (Week 1): Pt will complete UB dressing with mod assist with mod cues for initiation/sequencing OT Short Term Goal 3 (Week 1): Pt will complete LB dressing with max assist OT Short Term Goal 4 (Week 1): Pt will complete 1 grooming task in standing with mod assist for standing balance  Skilled Therapeutic Interventions/Progress Updates:   Pt received supine with wife present to help translate. Pt completed bed mobility with CGA to EOB. Pt completed stand pivot transfer with min A. Pt completed UB bathing at the sink with (S). Min A for dressing UB, max A LB. A great deal of assistance provided by pt's wife, including frequent cueing in native language. Wife very supportive and loving, but a bit over helpful at times in terms of pushing the pt. Pt completed shaving task with mod A, requiring assistance for thoroughness and d/t hesitancy from pt. Pt demonstrating confusion throughout session, often placing shaving cream on forehead. Pt completed oral care with set up assistance only. Pt completed stand pivot transfer with mod A to the toilet. He voided BM and his wife assisted with peri care. Pt transferred back to w/c. Pt provided with 2 foam blocks and given HEP verbally to increase B grip strength/dexterity needed to manage life vest eventually. Pt was left sitting up in the w/c, with Spo2 at 94% on 1 L O2 via [place]. Wife present, reminded not to transfer pt herself. Chair alarm set.   Therapy Documentation Precautions:  Precautions Precautions: Fall Restrictions Weight Bearing Restrictions:  No   Therapy/Group: Individual Therapy  Curtis Sites 05/07/2019, 7:24 AM

## 2019-05-07 NOTE — Progress Notes (Addendum)
PHYSICAL MEDICINE & REHABILITATION PROGRESS NOTE   Subjective/Complaints: Up with OT. Sleep and day/nights an issue. Sleep chart indicates improved sleep last night.  ROS: Patient denies fever, rash, sore throat, blurred vision, nausea, vomiting, diarrhea, cough, shortness of breath or chest pain, joint or back pain, headache, or mood change.    Objective:   No results found. Recent Labs    05/05/19 2357  WBC 17.0*  HGB 9.1*  HCT 29.0*  PLT 865*   Recent Labs    05/05/19 2357 05/06/19 0805  NA 139 139  K 5.6* 5.2*  CL 107 107  CO2 20* 23  GLUCOSE 198* 145*  BUN 105* 100*  CREATININE 2.92* 2.72*  CALCIUM 8.8* 8.8*    Intake/Output Summary (Last 24 hours) at 05/07/2019 0932 Last data filed at 05/07/2019 0809 Gross per 24 hour  Intake 600 ml  Output 3513 ml  Net -2913 ml     Physical Exam: Vital Signs Blood pressure 135/83, pulse 66, temperature 98.1 F (36.7 C), temperature source Oral, resp. rate 16, weight 81.1 kg, SpO2 97 %.   Constitutional: No distress . Vital signs reviewed. HEENT: EOMI, oral membranes moist Neck: supple Cardiovascular: RRR without murmur. No JVD    Respiratory: CTA Bilaterally without wheezes or rales. Normal effort    GI: BS +, non-tender, non-distended  Neurologic: alert. Oriented to person, place, attention limited. Cranial nerves II through XII intact, motor strength is 5/5 in bilateral deltoid, bicep, tricep, grip, hip flexor, knee extensors, ankle dorsiflexor and plantar flexor Normal sensation.  Musculoskeletal: Full range of motion in all 4 extremities. No joint swelling Psych: sl disinhibited   Assessment/Plan: 1. 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  Physiatrist is providing close team supervision and 24 hour management of active medical problems listed below.  Physiatrist and  rehab team continue to assess barriers to discharge/monitor patient progress toward functional and medical goals  Care Tool:  Bathing  Bathing activity did not occur: Safety/medical concerns Body parts bathed by patient: Right arm, Left arm, Chest, Abdomen, Face   Body parts bathed by helper: Front perineal area, Buttocks, Right upper leg, Left upper leg, Right lower leg, Left lower leg     Bathing assist Assist Level: Moderate Assistance - Patient 50 - 74%     Upper Body Dressing/Undressing Upper body dressing   What is the patient wearing?: Pull over shirt    Upper body assist Assist Level: Total Assistance - Patient < 25%    Lower Body Dressing/Undressing Lower body dressing      What is the patient wearing?: Incontinence brief     Lower body assist Assist for lower body dressing: Total Assistance - Patient < 25%     Toileting Toileting    Toileting assist Assist for toileting: Moderate Assistance - Patient 50 - 74%(Help with urinal)     Transfers Chair/bed transfer  Transfers assist     Chair/bed transfer assist level: Maximal Assistance - Patient 25 - 49%     Locomotion Ambulation   Ambulation assist      Assist level: Maximal Assistance - Patient 25 - 49% Assistive device: No Device Max distance: 2 feet   Walk 10 feet activity   Assist  Walk 10 feet activity did not occur: Safety/medical concerns(decreased strength/activity tolerance)        Walk 50 feet activity   Assist Walk 50 feet with  2 turns activity did not occur: Safety/medical concerns(decreased strength/activity tolerance)         Walk 150 feet activity   Assist Walk 150 feet activity did not occur: Safety/medical concerns(decreased strength/activity tolerance)         Walk 10 feet on uneven surface  activity   Assist Walk 10 feet on uneven surfaces activity did not occur: Safety/medical concerns(decreased strength/activity tolerance)          Wheelchair     Assist     Wheelchair activity did not occur: Safety/medical concerns(decreased strength/activity tolerance)         Wheelchair 50 feet with 2 turns activity    Assist    Wheelchair 50 feet with 2 turns activity did not occur: Safety/medical concerns(decreased strength/activity tolerance)       Wheelchair 150 feet activity     Assist  Wheelchair 150 feet activity did not occur: Safety/medical concerns(decreased strength/activity tolerance)       Blood pressure 135/83, pulse 66, temperature 98.1 F (36.7 C), temperature source Oral, resp. rate 16, weight 81.1 kg, SpO2 97 %.  Medical Problem List and Plan: 1.Debility and anoxic encephalopathysecondary to VT cardiac arrest --Continue CIR therapies including PT, OT, and SLP   -met with pt and wife today. Discussed importance of re-establishing sleep-wake cycle 2. Antithrombotics: -DVT/anticoagulation:Pharmaceutical:Heparin -antiplatelet therapy: ASA 3. Pain Management:N/A 4. Mood:LCSW to follow for evaluation and support. Does not have full awareness of deficits. -antipsychotic agents: change seroquel to pm only  -sleep chart 5. Neuropsych: This patientis notcapable of making decisions on hisown behalf. 6. Skin/Wound Care:Routine pressure relief measures 7. Fluids/Electrolytes/Nutrition:strict I/O. Encourage PO  -RD following for intake  8.CAD with STEMI/VTarrest: Treated medically for now.  -LifeVest orderedand to be fitted potentially today if he's strong enough to push alert/deactivation button. Dr Sung Amabile and heart team are following. . 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 Cr around 3.1 -nephrology  following -daily labs 12. Urine retention: --monitorbladder emptyingwith PVR checks.  -continue  Flomax   13. Acute blood loss anemia:   H&H stable 9.9 14.Combined heart failure/ischemic cardiomyopathy: Monitor for signs of overload and check weights daily. On Coreg twice daily. No ACE/arms or BB due to AKI and shock 15.Leukocytosis/Thrombocytosis: Afebrile off antibiotics. reactive/secondary to steroids. Trending down. No active signs of infection.  16.  Constipation: only one small liquid bm yesterday  -not eating much  -schedule miralax daily  LOS: 3 days A FACE TO FACE EVALUATION WAS PERFORMED  Meredith Staggers 05/07/2019, 9:32 AM

## 2019-05-07 NOTE — Progress Notes (Signed)
Physical Therapy Session Note  Patient Details  Name: Lawrence Olson [name] [id number] Date of Birth: 07/13/1951  Today's Date: 05/07/2019 PT Individual Time: 1030-1130 PT Individual Time Calculation (min): 60 min   Short Term Goals: Week 1:  PT Short Term Goal 1 (Week 1): Patient will perform rolling and supine to sit with min A. PT Short Term Goal 2 (Week 1): Patient will perform dynamic sitting balance with CGA. PT Short Term Goal 3 (Week 1): Patient will perform basic transfers with mod A of 1 person. PT Short Term Goal 4 (Week 1): Patient will initiate pre-gait or gait training.  Skilled Therapeutic Interventions/Progress Updates:   Pt asleep in bed, wife present.  He awakened easily, looking exhausted.  His wife stated that he is so tired from each therapy that he can't do the next one.  PT told pt and wife that schedule could be arranged to spread therapies out more. PT asked scheduling to spread therapies out during the day.  Pt willing to start with bedside tx.  Therapeutic exercise performed with LE to increase strength for functional mobility: supine- 10 x 1 each R/L straight leg raises, bil bridging, bil lower trunk rotation; 2 x 10 cervical flexion.  Pt counted aloud with cues, to avoid Valsalva maneuver.  Bed mobility training in flat bed no rails, to R as per home set up: CGA.  Sit> stand from raised bed CGA, x 2.  Stand pivot bed> w/c with max assist due to poor movement RLE, poor eccentric control.  Seated BP 118/72; HR 70; Os sats 97% on 2 L via [place].  Endurance task propelling w/c with bil UEs x 15'.  Sit> stand in // x 2.  Pt tolerated standing x 30 seconds x 2, wt shifting with cues for PT.  At end of session, pt in w/c iwht seat belt alarm set and needs at hand.     Therapy Documentation Precautions:  Precautions Precautions: Fall Restrictions Weight Bearing Restrictions: No RLE Weight Bearing: Non weight bearing LLE Weight Bearing: Non weight bearing        Therapy/Group: Individual Therapy  Rhet Rorke 05/07/2019, 12:20 PM

## 2019-05-07 NOTE — Progress Notes (Signed)
Social Work Assessment and Plan   Patient Details  Name: Lawrence Olson [name] [id number] Date of Birth: Oct 06, 1950  Today's Date: 05/07/2019  Problem List:  Patient Active Problem List   Diagnosis Date Noted  . Anoxic brain injury ([place]) 05/05/2019  . Pressure injury of skin 04/30/2019  . AKI (acute kidney injury) ([place])   . Acute respiratory failure ([place])   . Acute ST elevation myocardial infarction (STEMI) involving left anterior descending (LAD) coronary artery ([place]) 04/16/2019  . Cardiac arrest ([place]) 04/16/2019  . Acute respiratory failure with hypoxia and hypercarbia ([place]) 04/16/2019  . Cardiogenic shock ([place]) 04/16/2019  . VT (ventricular tachycardia) ([facility])    Past Medical History:  Past Medical History:  Diagnosis Date  . Jaundice    needed hospitalization in [place]   Past Surgical History:  Past Surgical History:  Procedure Laterality Date  . RIGHT HEART CATH AND CORONARY ANGIOGRAPHY N/A 04/16/2019   Procedure: RIGHT HEART CATH AND CORONARY ANGIOGRAPHY;  Surgeon: Troy Sine, MD;  Location: [facility];  Service: Cardiovascular;  Laterality: N/A;  . VENTRICULAR ASSIST DEVICE INSERTION N/A 04/16/2019   Procedure: VENTRICULAR ASSIST DEVICE INSERTION;  Surgeon: Troy Sine, MD;  Location: [facility];  Service: Cardiovascular;  Laterality: N/A;   Social History:  reports that he has quit smoking. He has never used smokeless tobacco. He reports current alcohol use. No history on file for drug.  Family / Support Systems Marital Status: Married Patient Roles: Spouse, Parent Spouse/Significant Other: wife, Lawrence Olson @ [phone] Children: daughter, Lawrence Olson (local) @ [phone] and local son as well. Anticipated Caregiver: wife (son and daughter live nearby ) Ability/Limitations of Caregiver: Min/Mod A Caregiver Availability: 24/7 Family Dynamics: Wife here daily and very involved and supportive.  She reports she has taken "early retirement so I can  be there and do everything for him."  Social History Preferred language: Bosnian Religion:  Cultural Background: Pt from [place] and, with his family, they moved to [place]. in 2000 Read: Yes Write: Yes Employment Status: Employed Length of Employment: 20(yrs) Public relations account executive Issues: None Guardian/Conservator: None - per MD, pt is not capable of making decisions on his own behalf - defer to spouse.   Abuse/Neglect Abuse/Neglect Assessment Can Be Completed: Yes Physical Abuse: Denies Verbal Abuse: Denies Sexual Abuse: Denies Exploitation of patient/patient's resources: Denies Self-Neglect: Denies  Emotional Status Pt's affect, behavior and adjustment status: Pt lying in bed with wife at bedside and able to complete assessment interview without difficulty. Pt and wife appear to speak english very well and have cancelled interpreter services per their request. Pt denies any s/s of emotional distress, however, will monitor and refer for neuropsychology as indicated. Recent Psychosocial Issues: None Psychiatric History: None Substance Abuse History: None  Patient / Family Perceptions, Expectations & Goals Pt/Family understanding of illness & functional limitations: Pt and wife with good, basic understanding of his cardiac arrest and ABI.  Good awareness of current functional limitations/ need for CIR. Premorbid pt/family roles/activities: Pt was completely independent and working f/t Anticipated changes in roles/activities/participation: Wife has stopped working and will provide primary caregiver support. Pt/family expectations/goals: Pt and wife hoepful for "good recovery!"  Recruitment consultant: None Premorbid Home Care/DME Agencies: None Transportation available at discharge: yes Resource referrals recommended: Neuropsychology  Discharge Planning Living Arrangements: Spouse/significant other Support Systems: Spouse/significant other, Children Type of  Residence: Private residence Insurance underwriter Resources: Chartered certified accountant Resources: Fish farm manager, Employment Financial Screen Referred: No Living Expenses: Medical laboratory scientific officer Management:  Patient, Spouse Does the patient have any problems obtaining your medications?: No Home Management: pt and wife Patient/Family Preliminary Plans: Pt to d/c home with wife able to provide 24/7 assistance. Social Work Anticipated Follow Up Needs: HH/OP Expected length of stay: 18-22 days  Clinical Impression Pleasant gentleman, who is originally from [place], here following cardiac arrest with anoxic BI.  Wife at bedside and very involved and attentive.  They are able to complete interview without interpreter services and have decline the offer of such services.  Both very optimistic about his recovery and wife denies any concerns about providing care to him at home.  Pt denies any s/s of emotional distress - will monitor.    Lawrence Olson 05/07/2019, 4:37 PM

## 2019-05-07 NOTE — Progress Notes (Signed)
[facility]    NEPHROLOGY PROGRESS NOTE  SUBJECTIVE: Patient seen and examined.  Working with physical therapy and appears quite fatigued.     OBJECTIVE:  Vitals:   05/07/19 0302 05/07/19 1437  BP: 135/83 128/78  Pulse: 66 69  Resp: 16 18  Temp: 98.1 F (36.7 C) (!) 97.5 F (36.4 C)  SpO2: 97% 94%    Intake/Output Summary (Last 24 hours) at 05/07/2019 1555 Last data filed at 05/07/2019 0809 Gross per 24 hour  Intake 480 ml  Output 2600 ml  Net -2120 ml      General: resting, NAD HEENT: MMM Christmas AT anicteric sclera Neck:  No JVD, no adenopathy CV:  Heart RRR  Lungs:  L/S CTA bilaterally Abd:  abd SNT/ND with normal BS GU:  Bladder non-palpable Extremities: no bilateral LE edema. Skin:  No skin rash  MEDICATIONS:  . aspirin  81 mg Oral Daily  . atorvastatin  80 mg Oral q1800  . B-complex with vitamin C  1 tablet Oral Daily  . carvedilol  3.125 mg Oral BID WC  . chlorhexidine  15 mL Mouth Rinse BID  . Chlorhexidine Gluconate Cloth  6 each Topical Daily  . famotidine  20 mg Oral Daily  . feeding supplement (PRO-STAT SUGAR FREE 64)  30 mL Oral BID  . heparin  5,000 Units Subcutaneous Q8H  . insulin detemir  5 Units Subcutaneous Q12H  . mouth rinse  15 mL Mouth Rinse q12n4p  . polyethylene glycol  17 g Oral Daily  . Ensure Max Protein  11 oz Oral Daily  . [START ON 05/08/2019] QUEtiapine  50 mg Oral QHS  . sodium chloride flush  10-40 mL Intracatheter Q12H  . tamsulosin  0.4 mg Oral QPC supper       LABS:   CBC Latest Ref Rng & Units 05/07/2019 05/05/2019 05/04/2019  WBC 4.0 - 10.5 K/uL 19.4(H) 17.0(H) 23.8(H)  Hemoglobin 13.0 - 17.0 g/dL 9.9(L) 9.1(L) 8.5(L)  Hematocrit 39.0 - 52.0 % 32.0(L) 29.0(L) 26.5(L)  Platelets 150 - 400 K/uL 850(H) 865(H) 896(H)    CMP Latest Ref Rng & Units 05/07/2019 05/06/2019 05/05/2019  Glucose 70 - 99 mg/dL 145(H) 145(H) 198(H)  BUN 8 - 23 mg/dL 96(H) 100(H) 105(H)  Creatinine 0.61 - 1.24 mg/dL 2.70(H) 2.72(H)  2.92(H)  Sodium 135 - 145 mmol/L 139 139 139  Potassium 3.5 - 5.1 mmol/L 5.5(H) 5.2(H) 5.6(H)  Chloride 98 - 111 mmol/L 105 107 107  CO2 22 - 32 mmol/L 24 23 20(L)  Calcium 8.9 - 10.3 mg/dL 8.9 8.8(L) 8.8(L)  Total Protein 6.5 - 8.1 g/dL - - 6.6  Total Bilirubin 0.3 - 1.2 mg/dL - - 1.0  Alkaline Phos 38 - 126 U/L - - 80  AST 15 - 41 U/L - - 23  ALT 0 - 44 U/L - - 57(H)    Lab Results  Component Value Date   CALCIUM 8.9 05/07/2019   CAION 1.12 (L) 05/01/2019   PHOS 6.1 (H) 05/04/2019       Component Value Date/Time   COLORURINE YELLOW 04/26/2019 1136   COLORURINE YELLOW 04/26/2019 1136   APPEARANCEUR CLOUDY (A) 04/26/2019 1136   APPEARANCEUR CLEAR 04/26/2019 1136   LABSPEC 1.011 04/26/2019 1136   LABSPEC 1.020 04/26/2019 1136   PHURINE 5.0 04/26/2019 1136   PHURINE 5.5 04/26/2019 1136   GLUCOSEU NEGATIVE 04/26/2019 1136   GLUCOSEU NEGATIVE 04/26/2019 1136   HGBUR MODERATE (A) 04/26/2019 1136   HGBUR MODERATE (A) 04/26/2019 1136  BILIRUBINUR NEGATIVE 04/26/2019 [place] 04/26/2019 1136   [place] 04/26/2019 1136   [place] 04/26/2019 1136   PROTEINUR NEGATIVE 04/26/2019 1136   PROTEINUR NEGATIVE 04/26/2019 1136   NITRITE NEGATIVE 04/26/2019 1136   NITRITE NEGATIVE 04/26/2019 1136   LEUKOCYTESUR NEGATIVE 04/26/2019 1136   LEUKOCYTESUR TRACE (A) 04/26/2019 1136      Component Value Date/Time   PHART 7.356 05/01/2019 0611   PCO2ART 46.4 05/01/2019 0611   PO2ART 107.0 05/01/2019 0611   HCO3 26.1 05/01/2019 0611   TCO2 28 05/01/2019 0611   ACIDBASEDEF 5.0 (H) 04/17/2019 0620   O2SAT 56.7 05/06/2019 0400    No results found for: IRON, TIBC, FERRITIN, IRONPCTSAT     ASSESSMENT/PLAN:     1. Nonoliguric dialysis dependent ischemic ATN, requiring CRRT 9/3 to 9/8: Appears to be recovering GFR.   Azotemia reflects low GFR and catabolic effect of steroids.  He had a fairly normal creatinine at the time of presentation.  Creatinine  continues to downtrend.  Temporary catheter removed.  2. Status post cardiac arrest, STEMI with lesion not amenable to PCI, ischemic cardiomyopathy with systolic heart failure and shock. As above. Advanced heart failure following.  Much improved. 3. 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 Anemia, hemoglobin stable 5. Hyperphosphatemia.  Should improve with recovering renal function.    [place], DO, [place]

## 2019-05-07 NOTE — IPOC Note (Signed)
Overall Plan of Care [facility]) Patient Details Name: Lawrence Olson [name] [id number] DOB: 03-13-1951  Admitting Diagnosis: Anoxic brain injury [facility])  Hospital Problems: Principal Problem:   Anoxic brain injury [facility])     Functional Problem List: Nursing Bladder, Medication Management, Motor, Nutrition, Pain, Safety, Skin Integrity  PT Balance, Behavior, Edema, Endurance, Motor, Nutrition, Pain, Perception, Safety, Sensory, Skin Integrity  OT Balance, Cognition, Endurance, Motor, Pain, Perception, Safety  SLP Cognition, Nutrition  TR         Basic ADL's: OT Grooming, Bathing, Dressing, Toileting     Advanced  ADL's: OT       Transfers: PT Bed Mobility, Bed to Chair, Car, Furniture, Floor  OT Toilet, Metallurgist: PT Ambulation, Emergency planning/management officer, Stairs     Additional Impairments: OT None  SLP Swallowing, Social Cognition   Memory, Problem Solving, Attention, Awareness  TR      Anticipated Outcomes Item Anticipated Outcome  Self Feeding Supervision  Swallowing  mod I   Basic self-care  Supervision  Toileting  Supervision - Min Assist   Bathroom Transfers Min assist  Bowel/Bladder  Pt will manage B/B w/ min assist  Transfers  Min A  Locomotion  Min A 50 feet  Communication     Cognition  Supervision  Pain  Pt will manage pain less than 3 w/ min assist  Safety/Judgment  Pt will remain free of falls throughout stay w/ min assist   Therapy Plan: PT Intensity: Minimum of 1-2 x/day ,45 to 90 minutes PT Frequency: 5 out of 7 days PT Duration Estimated Length of Stay: 2.5-3 weeks OT Intensity: Minimum of 1-2 x/day, 45 to 90 minutes OT Frequency: 5 out of 7 days OT Duration/Estimated Length of Stay: 18-22 days SLP Intensity: Minumum of 1-2 x/day, 30 to 90 minutes SLP Frequency: 3 to 5 out of 7 days SLP Duration/Estimated Length of Stay: 20-24 days   Due to the current state of emergency, patients may not be receiving their 3-hours of  Medicare-mandated therapy.   Team Interventions: Nursing Interventions Patient/Family Education, Bladder Management, Disease Management/Prevention, Medication Management, Skin Care/Wound Management, Cognitive Remediation/Compensation, Dysphagia/Aspiration Precaution Training, Discharge Planning, Psychosocial Support  PT interventions Ambulation/gait training, Discharge planning, Functional mobility training, Psychosocial support, Therapeutic Activities, Visual/perceptual remediation/compensation, Balance/vestibular training, Disease management/prevention, Neuromuscular re-education, Skin care/wound management, Therapeutic Exercise, Wheelchair propulsion/positioning, Cognitive remediation/compensation, DME/adaptive equipment instruction, Pain management, Splinting/orthotics, UE/LE Strength taining/ROM, Community reintegration, Technical sales engineer stimulation, Patient/family education, IT trainer, UE/LE Coordination activities  OT Interventions Training and development officer, Cognitive remediation/compensation, Academic librarian, Discharge planning, Disease mangement/prevention, Engineer, drilling, Functional mobility training, Neuromuscular re-education, Pain management, Patient/family education, Psychosocial support, Self Care/advanced ADL retraining, Therapeutic Activities, Therapeutic Exercise, UE/LE Strength taining/ROM, UE/LE Coordination activities  SLP Interventions Cognitive remediation/compensation, Internal/external aids, Cueing hierarchy, Dysphagia/aspiration precaution training, Environmental controls, Functional tasks, Patient/family education  TR Interventions    SW/CM Interventions Discharge Planning, Psychosocial Support, Patient/Family Education   Barriers to Discharge MD  Medical stability  Nursing      PT New oxygen    OT      SLP      SW       Team Discharge Planning: Destination: PT-Home ,OT- Home , SLP-Home Projected Follow-up: PT-Home health PT,  OT-  Home health OT, 24 hour supervision/assistance, SLP-Home Health SLP, 24 hour supervision/assistance Projected Equipment Needs: PT-To be determined, OT- 3 in 1 bedside comode, Tub/shower bench, SLP-None recommended by SLP Equipment Details: PT- , OT-  Patient/family involved in discharge planning: PT- Patient,  Family member/caregiver,  OT-Patient, Family member/caregiver, SLP-Patient, Family member/caregiver  MD ELOS: 20-24 days Medical Rehab Prognosis:  Excellent Assessment: The patient has been admitted for CIR therapies with the diagnosis of anoxic BI. The team will be addressing functional mobility, strength, stamina, balance, safety, adaptive techniques and equipment, self-care, bowel and bladder mgt, patient and caregiver education, NMR, cognition, community reentry. Goals have been set at supervision for self-care, supervision to min assist for mobility and supervision for cognition.   Due to the current state of emergency, patients may not be receiving their 3 hours per day of Medicare-mandated therapy.    Meredith Staggers, MD, FAAPMR      See Team Conference Notes for weekly updates to the plan of care

## 2019-05-07 NOTE — Progress Notes (Addendum)
Patient ID: Lawrence Olson, male   DOB: [birth year], 68 y.o.   [name] [id number]   Advanced Heart Failure Team  Note   Primary Physician: Patient, No Pcp Per PCP-Cardiologist:  No primary care provider on file.  HPI:    Moved to CIR on 9/11  BUN/CR 111/3.20 -> 110/3.10 -> 100/2.7->100/2.7   Echo 04/24/19 EF 25-30% no LV thrombus  Complaining of fatigue. Only able to stand 30 seconds. Denies SOB.   Objective:    Vital Signs:   Temp:  [97.6 F (36.4 C)-98.2 F (36.8 C)] 98.1 F (36.7 C) (09/14 0302) Pulse Rate:  [66-76] 66 (09/14 0302) Resp:  [16-22] 16 (09/14 0302) BP: (131-142)/(81-84) 135/83 (09/14 0302) SpO2:  [95 %-97 %] 97 % (09/14 0302) Weight:  [81.1 kg] 81.1 kg (09/14 0302) Last BM Date: 05/06/19  Weight change: Filed Weights   05/05/19 0257 05/06/19 0349 05/07/19 0302  Weight: 80.4 kg 80.9 kg 81.1 kg    Intake/Output:   Intake/Output Summary (Last 24 hours) at 05/07/2019 1115 Last data filed at 05/07/2019 0809 Gross per 24 hour  Intake 600 ml  Output 3213 ml  Net -2613 ml      Physical Exam   General:  Appears weak.  No resp difficulty. In the wheel chair in gym  HEENT: normal Neck: supple. no JVD. Carotids 2+ bilat; no bruits. No lymphadenopathy or thryomegaly appreciated. Cor: PMI nondisplaced. Regular rate & rhythm. No rubs, gallops or murmurs. Lungs: clear Abdomen: soft, nontender, nondistended. No hepatosplenomegaly. No bruits or masses. Good bowel sounds. Extremities: no cyanosis, clubbing, rash, edema Neuro: alert & orientedx3, cranial nerves grossly intact. moves all 4 extremities w/o difficulty. Affect pleasant  NOT on Telemetry   Labs   Basic Metabolic Panel: Recent Labs  Lab 04/30/19 1600 05/01/19 0320  05/02/19 0421 05/03/19 0440 05/04/19 0521 05/05/19 2357 05/06/19 0805 05/07/19 0756  NA 136 134*   < > 137 143 144 139 139 139  K 4.4 4.1   < > 3.9 4.3 4.2 5.6* 5.2* 5.5*  CL 102 101  --  102 108 110 107 107 105  CO2 25 25  --  24  23 24  20* 23 24  GLUCOSE 133* 172*  --  157* 149* 86 198* 145* 145*  BUN 48* 49*  --  87* 111* 110* 105* 100* 96*  CREATININE 1.83* 1.82*  --  3.02* 3.20* 3.10* 2.92* 2.72* 2.70*  CALCIUM 8.0* 7.6*  --  8.1* 8.3* 8.4* 8.8* 8.8* 8.9  MG  --  2.9*  --   --   --   --   --   --   --   PHOS 4.6 4.7*  --  6.4* 7.9* 6.1*  --   --   --    < > = values in this interval not displayed.    Liver Function Tests: Recent Labs  Lab 05/01/19 0320 05/02/19 0421 05/03/19 0440 05/04/19 0521 05/05/19 2357  AST  --   --   --   --  23  ALT  --   --   --   --  57*  ALKPHOS  --   --   --   --  80  BILITOT  --   --   --   --  1.0  PROT  --   --   --   --  6.6  ALBUMIN 1.9* 2.2* 2.2* 2.3* 2.4*   No results for input(s): LIPASE, AMYLASE in the last 168 hours. No  results for input(s): AMMONIA in the last 168 hours.  CBC: Recent Labs  Lab 05/01/19 0320 05/01/19 1610 05/02/19 0421 05/04/19 0521 05/05/19 2357 05/07/19 0756  WBC 34.5*  --  33.0* 23.8* 17.0* 19.4*  NEUTROABS  --   --   --   --  14.3*  --   HGB 8.1* 9.2* 8.4* 8.5* 9.1* 9.9*  HCT 25.9* 27.0* 26.8* 26.5* 29.0* 32.0*  MCV 99.6  --  98.5 96.7 96.3 97.9  PLT 699*  --  892* 896* 865* 850*    Cardiac Enzymes: Recent Labs  Lab 05/05/19 2357  CKTOTAL 21*    BNP: BNP (last 3 results) No results for input(s): BNP in the last 8760 hours.  ProBNP (last 3 results) No results for input(s): PROBNP in the last 8760 hours.   CBG: Recent Labs  Lab 05/05/19 0624 05/05/19 2045 05/06/19 0530 05/06/19 2127 05/07/19 0623  GLUCAP 148* 247* 117* 246* 121*    Coagulation Studies: No results for input(s): LABPROT, INR in the last 72 hours.   Imaging   No results found.   Medications:     Current Medications: . aspirin  81 mg Oral Daily  . atorvastatin  80 mg Oral q1800  . B-complex with vitamin C  1 tablet Oral Daily  . carvedilol  3.125 mg Oral BID WC  . chlorhexidine  15 mL Mouth Rinse BID  . Chlorhexidine Gluconate Cloth   6 each Topical Daily  . famotidine  20 mg Oral Daily  . feeding supplement (PRO-STAT SUGAR FREE 64)  30 mL Oral BID  . heparin  5,000 Units Subcutaneous Q8H  . insulin detemir  5 Units Subcutaneous Q12H  . mouth rinse  15 mL Mouth Rinse q12n4p  . polyethylene glycol  17 g Oral Daily  . Ensure Max Protein  11 oz Oral Daily  . [START ON 05/08/2019] QUEtiapine  50 mg Oral QHS  . sodium chloride flush  10-40 mL Intracatheter Q12H  . tamsulosin  0.4 mg Oral QPC supper    Infusions:   Assessment/Plan   1. CAD: Anterolateral STEMI, cath 8/24 showed occluded proximal LAD, unable to open.  Once we see how he recovers neurologically, will need to decide on feasibility of re-attempt PCI versus CABG. Medical therapy for the forseeable future. - No chest pain  - Continue medical therapy with ASA + statin.  - Continue low-dose carvedilol   2. Acute systolic HF - Echo with EF 25%, normal RV.  Impella removed 8/28.  Milrinone stopped 9/6.  - No ACE/ARB/ARNI with AKI - On low dose carvedilol  - Volume status stable.  -Renal function continues to recover.   - Cosndier hyrdal nitrates over next few days but do not want to drop BP too much in setting of recovering kidney function  (possible start tomorrow)  3. AKI:  -Suspect hemodynamic (ATN)   - CVVHD started 9/3 - Creatinine BUN 96 Creatinine 2.7  -  Can give Lokelma for hyperkalemia as needed  4. Cardiac arrest/VF: 20 minutes downtime, concerned for anoxic brain injury.  Neurology following, per yesterday's note he seems to have intact brainstem function but minimal cortical function. EEG suggestive of diffuse encephalopathy. CT head without definite evidence for anoxic encephalopathy.  MRI without convincing evidence of anoxic injury - Mental status much improved - Continue rehab.  - Will need LifeVest once strong enough to push alert deactivation button. Hopefully we can place on Monday - Ok to be on CIR without LifeVest or continuous  monitoring. He has been in hospital for 2 weeks with no recurrent VT/VF so risk for SCD felt to be low.  - Life Vest representative called to fit today. .  - Keep K > 4.0/Mg 2.0  5. Deconditioning - very weak. ? ICU myopathy - Appreciate CIR. Severe deconditioning.   - CK level ok   6. Possible pneumonitis  - stop prednisone today.   7. Hyperkalemia  K 5.5 Give lokelma. Check BMET in am.    CIR appreciated. Life Vest rep called to fit his Life Vest.   Amy Clegg NP-C  05/07/2019 11:15 AM  Agree,  See my note from today as well.  Glori Bickers, MD  1:57 PM

## 2019-05-07 NOTE — Progress Notes (Signed)
Speech Language Pathology Daily Session Note  Patient Details  Name: Lawrence Olson [name] [id number] Date of Birth: Oct 15, 1950  Today's Date: 05/07/2019 SLP Individual Time: 1330-1415 SLP Individual Time Calculation (min): 45 min  Short Term Goals: Week 1: SLP Short Term Goal 1 (Week 1): Pt will consume dys 2 textures and thin liquids with supervision cues for use of swallowing precautions and minimal overt s/s of aspiration. SLP Short Term Goal 2 (Week 1): Pt will consume therapeutic trials of dys 3 textures with supervision cues for use of swallowing precautions and minimal overt s/s of afspiration over 3 consecutive sessions prior to diet advancement. SLP Short Term Goal 3 (Week 1): Pt will utilize external aids to recall basic, daily information with mod assist verbal cues. SLP Short Term Goal 4 (Week 1): Pt will selectively attend to basic tasks for 15 minutes in the presence of mild environmental distractions with min verbal cues for redirection. SLP Short Term Goal 5 (Week 1): Pt will complete basic tasks with mod assist verbal cues for functional problem solving.  Skilled Therapeutic Interventions: Skilled treatment session focused on dysphagia and cognitive goals. Upon arrival, patient had not eat his lunch but was agreeable with encouragement from SLP. Patient consumed Dys. 2 textures with thin liquids without overt s/s of aspiration but required Min verbal cues for use of swallowing compensatory strategies, especially small bites. Recommend patient continue current diet. Patient required total A for orientation to situation and Max A verbal cues for recall of events from previous therapy sessions. Patient's wife present and assisted with interpreting although it appeared patient was understanding 100% accurately. In the middle of the session, an interpreter provided by the hospital arrived. The patient's wife appeared offended and was very upset. CSW was involved to help deesculate the  situation. Eventually the interpreter left but the patient's wife was perseverative on the matter despite Max verbal cues to redirect. Due to continued frustration from family, SLP session ended 15 mins early. Patient left upright in bed with alarm on and all needs within reach. Continue with current plan of care.      Pain No/Denies Pain  Therapy/Group: Individual Therapy  Normon Pettijohn 05/07/2019, 3:06 PM

## 2019-05-08 ENCOUNTER — Inpatient Hospital Stay (HOSPITAL_COMMUNITY): Payer: PPO

## 2019-05-08 ENCOUNTER — Telehealth (HOSPITAL_COMMUNITY): Payer: Self-pay

## 2019-05-08 ENCOUNTER — Inpatient Hospital Stay (HOSPITAL_COMMUNITY): Payer: PPO | Admitting: Physical Therapy

## 2019-05-08 LAB — BASIC METABOLIC PANEL
Anion gap: 12 (ref 5–15)
BUN: 84 mg/dL — ABNORMAL HIGH (ref 8–23)
CO2: 21 mmol/L — ABNORMAL LOW (ref 22–32)
Calcium: 8.7 mg/dL — ABNORMAL LOW (ref 8.9–10.3)
Chloride: 103 mmol/L (ref 98–111)
Creatinine, Ser: 2.48 mg/dL — ABNORMAL HIGH (ref 0.61–1.24)
GFR calc Af Amer: 30 mL/min — ABNORMAL LOW (ref >60)
GFR calc non Af Amer: 26 mL/min — ABNORMAL LOW (ref >60)
Glucose, Bld: 164 mg/dL — ABNORMAL HIGH (ref 70–99)
Potassium: 5.1 mmol/L (ref 3.5–5.1)
Sodium: 136 mmol/L (ref 135–145)

## 2019-05-08 LAB — GLUCOSE, CAPILLARY
Glucose-Capillary: 130 mg/dL — ABNORMAL HIGH (ref 70–99)
Glucose-Capillary: 225 mg/dL — ABNORMAL HIGH (ref 70–99)
Glucose-Capillary: 174 mg/dL — ABNORMAL HIGH (ref 70–99)
Glucose-Capillary: 207 mg/dL — ABNORMAL HIGH (ref 70–99)

## 2019-05-08 MED ORDER — INSULIN DETEMIR 100 UNIT/ML ~~LOC~~ SOLN
7.0000 [IU] | Freq: Two times a day (BID) | SUBCUTANEOUS | Status: DC
  Administered 2019-05-08 – 2019-05-09 (×3): 7 [IU] via SUBCUTANEOUS
  Filled 2019-05-08 (×5): qty 0.07

## 2019-05-08 MED ORDER — QUETIAPINE FUMARATE 50 MG PO TABS
75.0000 mg | ORAL_TABLET | Freq: Every day | ORAL | Status: DC
  Administered 2019-05-08 – 2019-05-13 (×6): 75 mg via ORAL
  Filled 2019-05-08 (×6): qty 1

## 2019-05-08 MED ORDER — MEGESTROL ACETATE 400 MG/10ML PO SUSP
400.0000 mg | Freq: Every day | ORAL | Status: DC
  Administered 2019-05-08 – 2019-05-13 (×5): 400 mg via ORAL
  Filled 2019-05-08 (×7): qty 10

## 2019-05-08 NOTE — Progress Notes (Addendum)
Occupational Therapy Session Note  Patient Details  Name: Lawrence Olson [name] [id number] Date of Birth: [birth year]  Today's Date: 05/08/2019 OT Individual Time: 2301-7209 OT Individual Time Calculation (min): 60 min    Short Term Goals: Week 1:  OT Short Term Goal 1 (Week 1): Pt will complete bathing with mod assist at sit > stand level with mod cues for initiation/sequencing OT Short Term Goal 2 (Week 1): Pt will complete UB dressing with mod assist with mod cues for initiation/sequencing OT Short Term Goal 3 (Week 1): Pt will complete LB dressing with max assist OT Short Term Goal 4 (Week 1): Pt will complete 1 grooming task in standing with mod assist for standing balance  Skilled Therapeutic Interventions/Progress Updates:    Pt received supine in bed with no c/o pain. Pt completed bed mobility with (S) to EOB. Discussed d/c planning and effect of fatigue/endurance. Pt completed stand pivot transfer to w/c with min A. Pt completed UB bathing at the sink with set up assistance. Pt completed UB dressing with (S). Mod A to don shorts. Pt able to stand multiple times at the sink with min A to power up with cueing for hand placement. Pt and his wife were taken down to the tub room to discuss equipment for shower transfers. Demo provided re TTB use and pt returned demo with min A. Both agreed this would be useful for home shower transfers. Pt was then taken down to the therapy gym for functional activity tolerance and mobility training. Pt now on RA with no desaturations throughout session. Pt completed 3x 25 ft of functional mobility with RW, with min A. Pt was issued medium resistance theraputty to increase B grip strength. Pt returned to room and was left sitting up with all needs met.   Therapy Documentation Precautions:  Precautions Precautions: Fall Restrictions Weight Bearing Restrictions: No RLE Weight Bearing: Non weight bearing LLE Weight Bearing: Non weight bearing  Therapy/Group:  Individual Therapy  Curtis Sites 05/08/2019, 7:13 AM

## 2019-05-08 NOTE — Progress Notes (Signed)
Physical Therapy Session Note  Patient Details  Name: Lawrence Olson [name] [id number] Date of Birth: July 04, 1951  Today's Date: 05/08/2019 PT Individual Time: 8003-4917 PT Individual Time Calculation (min): 68 min   Short Term Goals: Week 1:  PT Short Term Goal 1 (Week 1): Patient will perform rolling and supine to sit with min A. PT Short Term Goal 2 (Week 1): Patient will perform dynamic sitting balance with CGA. PT Short Term Goal 3 (Week 1): Patient will perform basic transfers with mod A of 1 person. PT Short Term Goal 4 (Week 1): Patient will initiate pre-gait or gait training.  Skilled Therapeutic Interventions/Progress Updates:    Pt received supine in bed with his wife present and pt agreeable to therapy session. Pt noted to have nasal cannula on incorrectly on his face - maintained on RA throughout sessions and SpO2 WNL (94% or higher after each activity). Supine>sit, using bedrails, with cuing for proper sequencing to increase independence and min assist for trunk upright. Sit<>stand using RW with min assist/CGA for lifting/lowering and steadying throughout session. Stand pivot EOB>w/c using RW with min assist for balance.  Transported to/from gym in w/c. Ambulated 135ft, 174ft (seated break between) using RW with min/mod assist for balance - demonstrates intermittent B LE toe drag during swing phase and decreased gait speed. Repeated sits<>stands with B UE support on therapist progressed to no UE support with CGA for steadying 3sets of 10 reps (pt only able to do 7 reps on 3rd set due to B LE fatigue). Performed alternate B LE foot taps on 4" step, no UE support, with pt demonstrating significant B LE muscle fatigue requiring mod assist for balance and pt reporting overall fatigue. Stand pivot EOM>w/c using RW with min assist for balance. Transported in w/c to day room for energy conservation. Stand pivot w/c<>Nustep using RW with min assist for balance. Performed B UE and B LE reciprocal  movements on Nustep targeting cardiopulmonary endurance and generalized strengthening against level 4 increased to level 5 resistance for 6 minutes totaling 288 steps - pt's Borg Rate of Perceived Exertion was 11 "light" despite pt demonstrating increased respiratory rate and labored breathing during exercise. Transported back to room in w/c and pt requesting to return to bed. Stand pivot w/c>EOB using RW with min assist for balance. Sit>supine with supervision. Pt left supine in bed with needs in reach, bed alarm on, his wife present, and RN present.  Therapy Documentation Precautions:  Precautions Precautions: Fall Restrictions Weight Bearing Restrictions: No RLE Weight Bearing: Non weight bearing LLE Weight Bearing: Non weight bearing  Pain: Reports "a little in the back, but this is normal." Did not require any intervention during session.   Therapy/Group: Individual Therapy  Tawana Scale, PT, DPT 05/08/2019, 2:42 PM

## 2019-05-08 NOTE — Patient Care Conference (Signed)
Inpatient RehabilitationTeam Conference and Plan of Care Update Date: 05/08/2019   Time: 10:20 AM    Patient Name: Lawrence Olson      Medical Record Number: [id number]  Date of Birth: 1951/06/02 Sex: Male         Room/Bed: 4M11C/4M11C-01 Payor Info: Payor: [employer] / Plan: [employer] PPO / Product Type: *No Product type* /    Admit Date/Time:  05/04/2019 10:15 PM  Primary Diagnosis:  Anoxic brain injury [facility])  Patient Active Problem List   Diagnosis Date Noted  . Anoxic brain injury ([place]) 05/05/2019  . Pressure injury of skin 04/30/2019  . AKI (acute kidney injury) ([place])   . Acute respiratory failure ([place])   . Acute ST elevation myocardial infarction (STEMI) involving left anterior descending (LAD) coronary artery ([place]) 04/16/2019  . Cardiac arrest ([place]) 04/16/2019  . Acute respiratory failure with hypoxia and hypercarbia ([place]) 04/16/2019  . Cardiogenic shock ([place]) 04/16/2019  . VT (ventricular tachycardia) [facility])     Expected Discharge Date: Expected Discharge Date: 05/16/19  Team Members Present: Physician leading conference: Dr. Alger Simons Social Worker Present: Lennart Pall, LCSW Nurse Present: Other (comment)(Michelle Orhun, LPN) PT Present: Donnamae Jude, PT OT Present: Laverle Hobby, OT SLP Present: Weston Anna, SLP PPS Coordinator present : Gunnar Fusi, SLP     Current Status/Progress Goal Weekly Team Focus  Bowel/Bladder   Incon. con of b/b.  To become con of both bowel and bladder with min assitance  continue timed toileting. Assess toileting needs q shift/prn   Swallow/Nutrition/ Hydration   Dys. 2 textures with thin liquids, Min A  Mod I  use of swallowing strategies, trials of Dys. 3 textures   ADL's   Fluctuates heavily, can transfer with as little as min A to max A depending on fatigue. Max A for LB dressing, min A UB dressing  min A transfers, (S) ADLs overall  functional activity tolerance, FMC/dexterity, ADL retraining,  cognitive retraining   Mobility   CGa bed mobility, CGA for sit> stand from raised bed, max asisst stand pivot transfer, superviison w/c x 25'  (some upgraded)min assist basic and car transfers; S w/c x 100', min assist gait x 50' with LRAD, mod assist up/down 1 step wiht LRAD for home entry  activity tolerance, strengthening, transfers, pre-gait/gait training, w/c propulsion   Communication             Safety/Cognition/ Behavioral Observations  Mod A  Supervision  attention, recall, problem solving, awareness   Pain   No c/o pain  Remain free of pain using prn medication as needed.  Assess pain q shift/prn. Medicate as ordered   Skin   No skin issue at this time.  Skin would remain free from breakdown.  assess skin q shift/prn    Rehab Goals Patient on target to meet rehab goals: Yes *See Care Plan and progress notes for long and short-term goals.     Barriers to Discharge  Current Status/Progress Possible Resolutions Date Resolved   Nursing                  PT  New oxygen                 OT Medical stability                SLP                SW  Discharge Planning/Teaching Needs:  Home with wife who can provide 24/7 assistance.  Ongoing as wife is here daily   Team Discussion:  Anoxia following MI;  Await life vest? Monitor kidney fxn.  Mostly cont b/b.  Changes made to tx schedule to allow for rest breaks.  tfs are variable depending on fatigue.  CG - min to amb ~ 25 '.  Hood motor control.  Goals supervision for ADLs and min with amb.  On D2, thin diet  Revisions to Treatment Plan:  NA    Medical Summary Current Status: anoxic BI after VT cardiac arrest. life vest pending improved strength. renal function improving Weekly Focus/Goal: improve sleep-wake, life vest plan, renal fxn  Barriers to Discharge: Behavior       Continued Need for Acute Rehabilitation Level of Care: The patient requires daily medical management by a physician with specialized  training in physical medicine and rehabilitation for the following reasons: Direction of a multidisciplinary physical rehabilitation program to maximize functional independence : Yes Medical management of patient stability for increased activity during participation in an intensive rehabilitation regime.: Yes Analysis of laboratory values and/or radiology reports with any subsequent need for medication adjustment and/or medical intervention. : Yes   I attest that I was present, lead the team conference, and concur with the assessment and plan of the team.   Lennart Pall 05/08/2019, 3:49 PM    Team conference was held via web/ teleconference due to [place] - 19

## 2019-05-08 NOTE — Progress Notes (Addendum)
[place] PHYSICAL MEDICINE & REHABILITATION PROGRESS NOTE   Subjective/Complaints: Was up and down last night. Can't seem to get settled down. Denies pain. still feels tired   ROS: Patient denies fever, rash, sore throat, blurred vision, nausea, vomiting, diarrhea, cough, shortness of breath or chest pain, joint or back pain, headache, or mood change.   Objective:    No results found. Recent Labs    05/05/19 2357 05/07/19 0756  WBC 17.0* 19.4*  HGB 9.1* 9.9*  HCT 29.0* 32.0*  PLT 865* 850*   Recent Labs    05/07/19 0756 05/08/19 0523  NA 139 136  K 5.5* 5.1  CL 105 103  CO2 24 21*  GLUCOSE 145* 164*  BUN 96* 84*  CREATININE 2.70* 2.48*  CALCIUM 8.9 8.7*    Intake/Output Summary (Last 24 hours) at 05/08/2019 0915 Last data filed at 05/08/2019 0813 Gross per 24 hour  Intake 598 ml  Output 1550 ml  Net -952 ml     Physical Exam: Vital Signs Blood pressure 134/83, pulse 75, temperature (!) 97.5 F (36.4 C), temperature source Oral, resp. rate 18, weight 81.1 kg, SpO2 97 %.   Constitutional: No distress . Vital signs reviewed. HEENT: EOMI, oral membranes moist Neck: supple, suture left neck Cardiovascular: RRR without murmur. No JVD    Respiratory: CTA Bilaterally without wheezes or rales. Normal effort    GI: BS +, non-tender, non-distended  Neurologic: alert. Oriented to person, place, attention limited. Cranial nerves II through XII intact, motor strength is 5/5 in bilateral deltoid, bicep, tricep, grip, hip flexor, knee extensors, ankle dorsiflexor and plantar flexor--stable Normal sensation.  Musculoskeletal: Full range of motion in all 4 extremities. No joint swelling Psych: remains sl disinhibited   Assessment/Plan: 1. 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  Physiatrist is providing close team supervision and 24 hour  management of active medical problems listed below.  Physiatrist and rehab team continue to assess barriers to discharge/monitor patient progress toward functional and medical goals  Care Tool:  Bathing  Bathing activity did not occur: Safety/medical concerns Body parts bathed by patient: Right arm, Left arm, Chest, Abdomen, Face   Body parts bathed by helper: Front perineal area, Buttocks, Right upper leg, Left upper leg, Right lower leg, Left lower leg     Bathing assist Assist Level: Moderate Assistance - Patient 50 - 74%     Upper Body Dressing/Undressing Upper body dressing   What is the patient wearing?: Pull over shirt    Upper body assist Assist Level: Total Assistance - Patient < 25%    Lower Body Dressing/Undressing Lower body dressing      What is the patient wearing?: Incontinence brief     Lower body assist Assist for lower body dressing: Total Assistance - Patient < 25%     Toileting Toileting    Toileting assist Assist for toileting: Moderate Assistance - Patient 50 - 74%     Transfers Chair/bed transfer  Transfers assist     Chair/bed transfer assist level: Maximal Assistance - Patient 25 - 49%     Locomotion Ambulation   Ambulation assist      Assist level: Maximal Assistance - Patient 25 - 49% Assistive device: No Device Max distance: 2 feet   Walk 10 feet activity   Assist  Walk 10 feet activity did not occur: Safety/medical concerns(decreased strength/activity tolerance)  Walk 50 feet activity   Assist Walk 50 feet with 2 turns activity did not occur: Safety/medical concerns(decreased strength/activity tolerance)         Walk 150 feet activity   Assist Walk 150 feet activity did not occur: Safety/medical concerns(decreased strength/activity tolerance)         Walk 10 feet on uneven surface  activity   Assist Walk 10 feet on uneven surfaces activity did not occur: Safety/medical concerns(decreased  strength/activity tolerance)         Wheelchair     Assist   Type of Wheelchair: Manual Wheelchair activity did not occur: Safety/medical concerns(decreased strength/activity tolerance)  Wheelchair assist level: Supervision/Verbal cueing Max wheelchair distance: 15    Wheelchair 50 feet with 2 turns activity    Assist    Wheelchair 50 feet with 2 turns activity did not occur: Safety/medical concerns(decreased strength/activity tolerance)   Assist Level: Supervision/Verbal cueing   Wheelchair 150 feet activity     Assist  Wheelchair 150 feet activity did not occur: Safety/medical concerns(decreased strength/activity tolerance)       Blood pressure 134/83, pulse 75, temperature (!) 97.5 F (36.4 C), temperature source Oral, resp. rate 18, weight 81.1 kg, SpO2 97 %.  Medical Problem List and Plan: 1.Debility and anoxic encephalopathysecondary to VT cardiac arrest --Continue CIR therapies including PT, OT, and SLP   -team conf today 2. Antithrombotics: -DVT/anticoagulation:Pharmaceutical:Heparin -antiplatelet therapy: ASA 3. Pain Management:N/A 4. Mood:LCSW to follow for evaluation and support. Does not have full awareness of deficits. -antipsychotic agents: change day time seroquel to pm only  -sleep chart  -increase pm seroquel to 75mg   At 2100 nightly 5. Neuropsych: This patientis notcapable of making decisions on hisown behalf. 6. Skin/Wound Care:Routine pressure relief measures 7. Fluids/Electrolytes/Nutrition:strict I/O. Encourage PO  -RD following for intake   -intake still poor---trial of megace 400mg  bid 8.CAD with STEMI/VTarrest: Treated medically for now.  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 Cr around 3.1 -nephrology following -daily labs 12. Urine retention: --monitorbladder emptyingwith PVR in 100's -continue  Flomax   13. Acute blood loss anemia:   H&H stable 9.9 14.Combined heart failure/ischemic cardiomyopathy: Monitor for signs of overload and check weights daily.    Filed Weights   05/05/19 0257 05/06/19 0349 05/07/19 0302  Weight: 80.4 kg 80.9 kg 81.1 kg     -On Coreg twice daily.   -No ACE/arms or BB due to AKI and shock 15.Leukocytosis/Thrombocytosis: Afebrile off antibiotics. reactive/secondary to steroids. Trending down. No active signs of infection.  16.  Constipation:   Intake inconsistent  -scheduled miralax daily  LOS: 4 days A FACE TO FACE EVALUATION WAS PERFORMED  Meredith Staggers 05/08/2019, 9:15 AM

## 2019-05-08 NOTE — Progress Notes (Signed)
Social Work Patient ID: Lawrence Olson, male   DOB: [birth year], 68 y.o.   [name] [id number]  Have reviewed team conference with pt and wife.  Both aware and agreeable with targeted d/c date of 9/23 and supervision - min assist goals overall.  Continue to follow.  Sharvi Mooneyhan, LCSW

## 2019-05-08 NOTE — Telephone Encounter (Signed)
Pt insurance is active and benefits verified through [employer]. Co-pay $15.00, DED $0.00/$0.00 met, out of pocket $3,400.00/$60.00 met, co-insurance 0%. No pre-authorization required. Passport, 05/08/2019 @ 306PM, HAZ#[id number]  Will contact patient to see if he is interested in the Cardiac Rehab Program. If interested, patient will need to complete follow up appt. Once completed, patient will be contacted for scheduling upon review by the RN Navigator.

## 2019-05-08 NOTE — Progress Notes (Signed)
Speech Language Pathology Daily Session Note  Patient Details  Name: Lawrence Olson [name] [id number] Date of Birth: 06-03-1951  Today's Date: 05/08/2019 SLP Individual Time: 1105-1205 SLP Individual Time Calculation (min): 60 min  Short Term Goals: Week 1: SLP Short Term Goal 1 (Week 1): Pt will consume dys 2 textures and thin liquids with supervision cues for use of swallowing precautions and minimal overt s/s of aspiration. SLP Short Term Goal 2 (Week 1): Pt will consume therapeutic trials of dys 3 textures with supervision cues for use of swallowing precautions and minimal overt s/s of afspiration over 3 consecutive sessions prior to diet advancement. SLP Short Term Goal 3 (Week 1): Pt will utilize external aids to recall basic, daily information with mod assist verbal cues. SLP Short Term Goal 4 (Week 1): Pt will selectively attend to basic tasks for 15 minutes in the presence of mild environmental distractions with min verbal cues for redirection. SLP Short Term Goal 5 (Week 1): Pt will complete basic tasks with mod assist verbal cues for functional problem solving.  Skilled Therapeutic Interventions:Skilled ST services focused on education, swallow and cognitive skills. Pt's wife was present and provided interpretation, however pt appeared to comprehend 80% of communciation in [place]. SLP facilitated PO consumption of lunch tray, dys 2 and thin via cup, pt consumed limited amounts due to preference (diet preference and feeling full), however only required supervision A verbal cues for small bites and utilizing liquid wash, no overt s/s aspiration. SLP education pt and pt's wife how to place meal orders via phone, to increase PO intake and consuming desired items, assisted pt's wife in placement upcoming meal orders. Pt consumed limited snack trial (x1 cracker) of dys 3 with supervision A verbal cues for liquid wash to clear minimal oral residue. SLP provided education giving handout to pt and  pt's wife pertaining to dys 2 textures, after they expressed desire to bring in food from home, all questions were answered to satisfaction. SLP also facilitated basic problem solving skills in novel card task, pt required initial mod A verbal cues fading to mod I for problem solving, likely due to language barrier and recall ability was unable to be assess due to wife providing recall cues despite reminders from SLP. Pt expressed cognitive abilities are back to baseline and pt's wife supported statement, however skills have not been challenged. SLP will focus on semi-complex/ complex problem solving in upcoming sessions to assess ability. Pt was left in room with wife, call bell within reach and bed alarm set. ST recommends to continue skilled ST services.      Pain Pain Assessment Pain Score: 0-No pain  Therapy/Group: Individual Therapy  Tanuj Mullens  [facility] 05/08/2019, 12:13 PM

## 2019-05-08 NOTE — Progress Notes (Signed)
One suture removed from left side of pt neck. No bleeding nor other drainage. Pt tolerated well. Surrounding skin intact. Will continue to monitor. Larina Earthly

## 2019-05-08 NOTE — Progress Notes (Signed)
[facility]    NEPHROLOGY PROGRESS NOTE  SUBJECTIVE: improving creatinine and BUN.  To have therapies soon.  Wife at bedside.      OBJECTIVE:  Vitals:   05/07/19 1915 05/08/19 0533  BP: 133/81 134/83  Pulse: 69 75  Resp: 16 18  Temp: 98.7 F (37.1 C) (!) 97.5 F (36.4 C)  SpO2: 97% 97%    Intake/Output Summary (Last 24 hours) at 05/08/2019 1332 Last data filed at 05/08/2019 0813 Gross per 24 hour  Intake 358 ml  Output 1550 ml  Net -1192 ml      General: resting, NAD, able to lay flat Neck:  No JVD CV:  Heart RRR  Lungs:  L/S CTA bilaterally Abd:  abd SNT/ND with normal BS Extremities: no bilateral LE edema.  MEDICATIONS:  . aspirin  81 mg Oral Daily  . atorvastatin  80 mg Oral q1800  . B-complex with vitamin C  1 tablet Oral Daily  . carvedilol  3.125 mg Oral BID WC  . chlorhexidine  15 mL Mouth Rinse BID  . Chlorhexidine Gluconate Cloth  6 each Topical Daily  . famotidine  20 mg Oral Daily  . feeding supplement (PRO-STAT SUGAR FREE 64)  30 mL Oral BID  . heparin  5,000 Units Subcutaneous Q8H  . insulin detemir  7 Units Subcutaneous Q12H  . mouth rinse  15 mL Mouth Rinse q12n4p  . megestrol  400 mg Oral Daily  . polyethylene glycol  17 g Oral Daily  . Ensure Max Protein  11 oz Oral Daily  . QUEtiapine  75 mg Oral QHS  . sodium chloride flush  10-40 mL Intracatheter Q12H  . tamsulosin  0.4 mg Oral QPC supper       LABS:   CBC Latest Ref Rng & Units 05/07/2019 05/05/2019 05/04/2019  WBC 4.0 - 10.5 K/uL 19.4(H) 17.0(H) 23.8(H)  Hemoglobin 13.0 - 17.0 g/dL 9.9(L) 9.1(L) 8.5(L)  Hematocrit 39.0 - 52.0 % 32.0(L) 29.0(L) 26.5(L)  Platelets 150 - 400 K/uL 850(H) 865(H) 896(H)    CMP Latest Ref Rng & Units 05/08/2019 05/07/2019 05/06/2019  Glucose 70 - 99 mg/dL 164(H) 145(H) 145(H)  BUN 8 - 23 mg/dL 84(H) 96(H) 100(H)  Creatinine 0.61 - 1.24 mg/dL 2.48(H) 2.70(H) 2.72(H)  Sodium 135 - 145 mmol/L 136 139 139  Potassium 3.5 - 5.1 mmol/L 5.1  5.5(H) 5.2(H)  Chloride 98 - 111 mmol/L 103 105 107  CO2 22 - 32 mmol/L 21(L) 24 23  Calcium 8.9 - 10.3 mg/dL 8.7(L) 8.9 8.8(L)  Total Protein 6.5 - 8.1 g/dL - - -  Total Bilirubin 0.3 - 1.2 mg/dL - - -  Alkaline Phos 38 - 126 U/L - - -  AST 15 - 41 U/L - - -  ALT 0 - 44 U/L - - -    Lab Results  Component Value Date   CALCIUM 8.7 (L) 05/08/2019   CAION 1.12 (L) 05/01/2019   PHOS 6.1 (H) 05/04/2019       Component Value Date/Time   COLORURINE YELLOW 04/26/2019 1136   COLORURINE YELLOW 04/26/2019 1136   APPEARANCEUR CLOUDY (A) 04/26/2019 1136   APPEARANCEUR CLEAR 04/26/2019 1136   LABSPEC 1.011 04/26/2019 1136   LABSPEC 1.020 04/26/2019 1136   PHURINE 5.0 04/26/2019 1136   PHURINE 5.5 04/26/2019 1136   GLUCOSEU NEGATIVE 04/26/2019 1136   GLUCOSEU NEGATIVE 04/26/2019 1136   HGBUR MODERATE (A) 04/26/2019 1136   HGBUR MODERATE (A) 04/26/2019 1136   BILIRUBINUR NEGATIVE 04/26/2019 1136  BILIRUBINUR NEGATIVE 04/26/2019 Lawrence Olson 04/26/2019 1136   Lawrence Olson 04/26/2019 1136   PROTEINUR NEGATIVE 04/26/2019 1136   PROTEINUR NEGATIVE 04/26/2019 1136   NITRITE NEGATIVE 04/26/2019 1136   NITRITE NEGATIVE 04/26/2019 1136   LEUKOCYTESUR NEGATIVE 04/26/2019 1136   LEUKOCYTESUR TRACE (A) 04/26/2019 1136      Component Value Date/Time   PHART 7.356 05/01/2019 0611   PCO2ART 46.4 05/01/2019 0611   PO2ART 107.0 05/01/2019 0611   HCO3 26.1 05/01/2019 0611   TCO2 28 05/01/2019 0611   ACIDBASEDEF 5.0 (H) 04/17/2019 0620   O2SAT 56.7 05/06/2019 0400    No results found for: IRON, TIBC, FERRITIN, IRONPCTSAT     ASSESSMENT/PLAN:     1. Nonoliguric dialysis dependent ischemic ATN, requiring CRRT 9/3 to 9/8: Appears to be recovering GFR.   Azotemia reflects low GFR and catabolic effect of steroids.  He had a fairly normal creatinine at the time of presentation.  Creatinine continues to downtrend.  Temporary catheter removed. Will watch K and Cr daily. 2. Status  post cardiac arrest, STEMI with lesion not amenable to PCI, ischemic cardiomyopathy with systolic heart failure and shock. As above. Advanced heart failure following.  Much improved. 3. 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 Anemia, hemoglobin stable 5. Hyperphosphatemia.  Should improve with recovering renal function.

## 2019-05-09 ENCOUNTER — Inpatient Hospital Stay (HOSPITAL_COMMUNITY): Payer: PPO | Admitting: Speech Pathology

## 2019-05-09 ENCOUNTER — Inpatient Hospital Stay (HOSPITAL_COMMUNITY): Payer: PPO

## 2019-05-09 ENCOUNTER — Inpatient Hospital Stay (HOSPITAL_COMMUNITY): Payer: PPO | Admitting: Physical Therapy

## 2019-05-09 DIAGNOSIS — N179 Acute kidney failure, unspecified: Secondary | ICD-10-CM

## 2019-05-09 DIAGNOSIS — I5043 Acute on chronic combined systolic (congestive) and diastolic (congestive) heart failure: Secondary | ICD-10-CM

## 2019-05-09 DIAGNOSIS — D62 Acute posthemorrhagic anemia: Secondary | ICD-10-CM

## 2019-05-09 DIAGNOSIS — D72829 Elevated white blood cell count, unspecified: Secondary | ICD-10-CM

## 2019-05-09 DIAGNOSIS — R0789 Other chest pain: Secondary | ICD-10-CM

## 2019-05-09 DIAGNOSIS — E1165 Type 2 diabetes mellitus with hyperglycemia: Secondary | ICD-10-CM

## 2019-05-09 DIAGNOSIS — D473 Essential (hemorrhagic) thrombocythemia: Secondary | ICD-10-CM

## 2019-05-09 DIAGNOSIS — D75839 Thrombocytosis, unspecified: Secondary | ICD-10-CM

## 2019-05-09 LAB — BASIC METABOLIC PANEL
Anion gap: 11 (ref 5–15)
BUN: 80 mg/dL — ABNORMAL HIGH (ref 8–23)
CO2: 20 mmol/L — ABNORMAL LOW (ref 22–32)
Calcium: 8.8 mg/dL — ABNORMAL LOW (ref 8.9–10.3)
Chloride: 105 mmol/L (ref 98–111)
Creatinine, Ser: 2.5 mg/dL — ABNORMAL HIGH (ref 0.61–1.24)
GFR calc Af Amer: 29 mL/min — ABNORMAL LOW (ref >60)
GFR calc non Af Amer: 25 mL/min — ABNORMAL LOW (ref >60)
Glucose, Bld: 169 mg/dL — ABNORMAL HIGH (ref 70–99)
Potassium: 4.9 mmol/L (ref 3.5–5.1)
Sodium: 136 mmol/L (ref 135–145)

## 2019-05-09 LAB — GLUCOSE, CAPILLARY
Glucose-Capillary: 168 mg/dL — ABNORMAL HIGH (ref 70–99)
Glucose-Capillary: 234 mg/dL — ABNORMAL HIGH (ref 70–99)

## 2019-05-09 MED ORDER — LIDOCAINE 5 % EX PTCH
1.0000 | MEDICATED_PATCH | CUTANEOUS | Status: DC
  Administered 2019-05-09 – 2019-05-13 (×5): 1 via TRANSDERMAL
  Filled 2019-05-09 (×6): qty 1

## 2019-05-09 NOTE — Progress Notes (Signed)
Patient ID: Lawrence Olson, male   DOB: 12/08/1950, 68 y.o.   [name] [id number]   Advanced Heart Failure Team  Note   Primary Physician: Patient, No Pcp Per PCP-Cardiologist:  No primary care provider on file.  HPI:    Moved to CIR on 9/11  Both he and his wife say he is getting stronger everyday. Denies CP or SOB. No orthopnea or PND. Appetite picking up.   Echo 04/24/19 EF 25-30% no LV thrombus   Objective:    Vital Signs:   Temp:  [98.1 F (36.7 C)-98.4 F (36.9 C)] 98.1 F (36.7 C) (09/16 1350) Pulse Rate:  [71-79] 71 (09/16 1350) Resp:  [18-19] 18 (09/16 0424) BP: (123-139)/(74-80) 133/79 (09/16 1350) SpO2:  [94 %-97 %] 97 % (09/16 1350) Weight:  [74.7 kg] 74.7 kg (09/16 0424) Last BM Date: 05/09/19  Weight change: Filed Weights   05/06/19 0349 05/07/19 0302 05/09/19 0424  Weight: 80.9 kg 81.1 kg 74.7 kg    Intake/Output:   Intake/Output Summary (Last 24 hours) at 05/09/2019 1841 Last data filed at 05/09/2019 1320 Gross per 24 hour  Intake 777 ml  Output 400 ml  Net 377 ml      Physical Exam   General:  Lying in bed . No resp difficulty HEENT: normal Neck: supple. no JVD. Carotids 2+ bilat; no bruits. No lymphadenopathy or thryomegaly appreciated. Cor: PMI nondisplaced. Regular rate & rhythm. No rubs, gallops or murmurs. Lungs: clear Abdomen: soft, nontender, nondistended. No hepatosplenomegaly. No bruits or masses. Good bowel sounds. Extremities: no cyanosis, clubbing, rash, edema Neuro: alert & orientedx3, cranial nerves grossly intact. moves all 4 extremities w/o difficulty. Affect pleasant   Telemetry   NSR 70s Personally reviewed  Labs   Basic Metabolic Panel: Recent Labs  Lab 05/03/19 0440 05/04/19 0521 05/05/19 2357 05/06/19 0805 05/07/19 0756 05/08/19 0523 05/09/19 0554  NA 143 144 139 139 139 136 136  K 4.3 4.2 5.6* 5.2* 5.5* 5.1 4.9  CL 108 110 107 107 105 103 105  CO2 23 24 20* 23 24 21* 20*  GLUCOSE 149* 86 198* 145* 145* 164*  169*  BUN 111* 110* 105* 100* 96* 84* 80*  CREATININE 3.20* 3.10* 2.92* 2.72* 2.70* 2.48* 2.50*  CALCIUM 8.3* 8.4* 8.8* 8.8* 8.9 8.7* 8.8*  PHOS 7.9* 6.1*  --   --   --   --   --     Liver Function Tests: Recent Labs  Lab 05/03/19 0440 05/04/19 0521 05/05/19 2357  AST  --   --  23  ALT  --   --  57*  ALKPHOS  --   --  80  BILITOT  --   --  1.0  PROT  --   --  6.6  ALBUMIN 2.2* 2.3* 2.4*   No results for input(s): LIPASE, AMYLASE in the last 168 hours. No results for input(s): AMMONIA in the last 168 hours.  CBC: Recent Labs  Lab 05/04/19 0521 05/05/19 2357 05/07/19 0756  WBC 23.8* 17.0* 19.4*  NEUTROABS  --  14.3*  --   HGB 8.5* 9.1* 9.9*  HCT 26.5* 29.0* 32.0*  MCV 96.7 96.3 97.9  PLT 896* 865* 850*    Cardiac Enzymes: Recent Labs  Lab 05/05/19 2357  CKTOTAL 21*    BNP: BNP (last 3 results) No results for input(s): BNP in the last 8760 hours.  ProBNP (last 3 results) No results for input(s): PROBNP in the last 8760 hours.   CBG: Recent Labs  Lab  05/08/19 1139 05/08/19 1701 05/08/19 2058 05/09/19 0657 05/09/19 1119  GLUCAP 225* 174* 207* 168* 234*    Coagulation Studies: No results for input(s): LABPROT, INR in the last 72 hours.   Imaging   No results found.   Medications:     Current Medications: . aspirin  81 mg Oral Daily  . atorvastatin  80 mg Oral q1800  . B-complex with vitamin C  1 tablet Oral Daily  . carvedilol  3.125 mg Oral BID WC  . chlorhexidine  15 mL Mouth Rinse BID  . Chlorhexidine Gluconate Cloth  6 each Topical Daily  . famotidine  20 mg Oral Daily  . feeding supplement (PRO-STAT SUGAR FREE 64)  30 mL Oral BID  . heparin  5,000 Units Subcutaneous Q8H  . insulin detemir  7 Units Subcutaneous Q12H  . lidocaine  1 patch Transdermal Q24H  . mouth rinse  15 mL Mouth Rinse q12n4p  . megestrol  400 mg Oral Daily  . polyethylene glycol  17 g Oral Daily  . Ensure Max Protein  11 oz Oral Daily  . QUEtiapine  75 mg  Oral QHS  . sodium chloride flush  10-40 mL Intracatheter Q12H  . tamsulosin  0.4 mg Oral QPC supper    Infusions:   Assessment/Plan   1. CAD: Anterolateral STEMI, cath 8/24 showed occluded proximal LAD, unable to open.  Once we see how he recovers neurologically, will need to decide on feasibility of re-attempt PCI versus CABG. Medical therapy for the forseeable future. - No s/s ischemia - Continue medical therapy with ASA + statin and carvedilol   2. Acute systolic HF - Echo with EF 25%, normal RV.  - No ACE/ARB/ARNI with AKI - On low dose carvedilol  - Volume status ok. Renal function continues to recover.   - Consider hyrdal nitrates over next few days but do not want to drop BP too much in setting of recovering kidney function  - Daily weights appear inaccurate  3. AKI:  -improving slowly. Volume status ok. No uremia. Hyperkalemia resolving - d/w Renal at bedside   4. Cardiac arrest/VF:  - much improved - Continue rehab.  - Will need LifeVest once strong enough to push alert deactivation button. Suspect this will be int he next day or two - Keep K > 4.0/Mg 2.0    Glori Bickers MD 05/09/2019 6:41 PM

## 2019-05-09 NOTE — Progress Notes (Signed)
Physical Therapy Session Note  Patient Details  Name: Lawrence Olson [name] [id number] Date of Birth: 09-24-1950  Today's Date: 05/09/2019 PT Individual Time: 3846-6599 PT Individual Time Calculation (min): 43 min   Short Term Goals: Week 1:  PT Short Term Goal 1 (Week 1): Patient will perform rolling and supine to sit with min A. PT Short Term Goal 2 (Week 1): Patient will perform dynamic sitting balance with CGA. PT Short Term Goal 3 (Week 1): Patient will perform basic transfers with mod A of 1 person. PT Short Term Goal 4 (Week 1): Patient will initiate pre-gait or gait training.  Skilled Therapeutic Interventions/Progress Updates:    Pt up in w/c upoin arrival and agreeable to session. Wife present throughout session and supportive. Sit<>stand min assist > CGA. Sit<>supine CGA. Gait: 50 ft with rw with transition to bilateral HHA X 125 ft, 125 ft the front UE support and manual cues for trunk stability. NRE: quadraped static with progression to rhythmic stabilization. High kneeling with +2 assist to achieve, quickly fatiguing and reports of mild pain. Pt returned to room after session, wife present. Pt and spouse refusing alarm. Expressed need to notify nursing when leaving. All needs in reach after session.   Therapy Documentation Precautions:  Precautions Precautions: Fall Restrictions Weight Bearing Restrictions: No RLE Weight Bearing: Non weight bearing LLE Weight Bearing: Non weight bearing Pain:   Reports pain as mild through chest muscles, monitored during session.      Therapy/Group: Individual Therapy  Linard Millers 05/09/2019, 3:08 PM

## 2019-05-09 NOTE — Progress Notes (Signed)
Physical Therapy Session Note  Patient Details  Name: Lawrence Olson [name] [id number] Date of Birth: [birth year]  Today's Date: 05/09/2019 PT Individual Time: 1130-1200 PT Individual Time Calculation (min): 30 min   Short Term Goals: Week 1:  PT Short Term Goal 1 (Week 1): Patient will perform rolling and supine to sit with min A. PT Short Term Goal 2 (Week 1): Patient will perform dynamic sitting balance with CGA. PT Short Term Goal 3 (Week 1): Patient will perform basic transfers with mod A of 1 person. PT Short Term Goal 4 (Week 1): Patient will initiate pre-gait or gait training. Week 2:    Week 3:     Skilled Therapeutic Interventions/Progress Updates:  Pt w/family member present and served as Astronomer. Pain:  Pt denies pain this am  Pt initially supine in bed.  Supine to sit w/hob elevated w/cga.  It sitting donned shoes w/mod assist, max assist to tie. Sitting balance good with this activity.  Sit to stand w/cga for transition, static stand min to mod assist due to decreased abdominal/trunk control and forward lean.    Gait 87ft x 1, 281ft x 1 w/HHA/min to mod assist of 2.  Initially pt leaning mod anteriorly which improved as gait progressed, narrow base of support and inconsistent decreased clearance bilat.  Core/trunk control/weakness primary contributor to gait deviations.  Parallel bars -  Sidestepping 12ft x 4 L/R w/cues for increased step length, bars for balance, cga Static standing balance - pt w/significant increased overall sway in standing w/core instability as primary factor.  Stood 35min, 54min w/cga to mod assist/use of bars w/largest excursions occurring anteriorly w/obvious abdominal weakness.  Pt returned to room w/family member present.  Remained OOB in wc w/needs in reach, family agreed not to leave pt alone at any time.        Therapy Documentation Precautions:   Therapy/Group: Individual Therapy  Callie Fielding, [place] 05/09/2019, 12:24 PM

## 2019-05-09 NOTE — Progress Notes (Signed)
Speech Language Pathology Daily Session Note  Patient Details  Name: Lawrence Olson [name] [id number] Date of Birth: 10/03/1950  Today's Date: 05/09/2019 SLP Individual Time: 5597-4163 SLP Individual Time Calculation (min): 57 min  Short Term Goals: Week 1: SLP Short Term Goal 1 (Week 1): Pt will consume dys 2 textures and thin liquids with supervision cues for use of swallowing precautions and minimal overt s/s of aspiration. SLP Short Term Goal 2 (Week 1): Pt will consume therapeutic trials of dys 3 textures with supervision cues for use of swallowing precautions and minimal overt s/s of afspiration over 3 consecutive sessions prior to diet advancement. SLP Short Term Goal 3 (Week 1): Pt will utilize external aids to recall basic, daily information with mod assist verbal cues. SLP Short Term Goal 4 (Week 1): Pt will selectively attend to basic tasks for 15 minutes in the presence of mild environmental distractions with min verbal cues for redirection. SLP Short Term Goal 5 (Week 1): Pt will complete basic tasks with mod assist verbal cues for functional problem solving.  Skilled Therapeutic Interventions: Pt was seen for skilled ST services focused on cognition and dysphagia. Pt's wife was present and provided interpretation in preferred language as needed. Of note, pt's wife provided intermittent verbal cues throughout session, despite ST's requests to allow pt to complete tasks without cues first. This made it difficult to fully assess pt's true understanding of information during some cognitive tasks. SLP facilitated session with skilled observation of pt consuming upgraded dysphagia 3 solids with thin liquids. No overt s/s aspiration observed. Pt demonstrated efficient mastication and oral clearance of POs with Min A for use of small bites and liquid wash. SLP also facilitated session with introduction of complex medication management activity with BID pill box. Pt and wife reported pt took 0  medications prior to admission, therefore Total A provided to identify names, functions, and dosages of current medications. Pt organized medications in a BID pill box with overall Min A for organization, error awareness and correction. Pt was left with NT present to assist with toileting. Continue per current plan of care.      Pain Pain Assessment Pain Scale: Faces Faces Pain Scale: No hurt  Therapy/Group: Individual Therapy  Arbutus Leas 05/09/2019, 12:14 PM

## 2019-05-09 NOTE — Progress Notes (Signed)
Social Work Patient ID: Lawrence Olson, male   DOB: Apr 27, 1951, 68 y.o.   [name] [id number]  Spoke with pt's daughter this afternoon to address questions.  We reviewed DME and HH follow up that would be arranged. She is asking about Armed forces training and education officer education and hopeful she can be included along with her mother and father.  She has additional questions for MD - have alerted Dr. Naaman Plummer.  Continue to follow.  Nastashia Gallo, LCSW

## 2019-05-09 NOTE — Care Management (Signed)
Inpatient [facility] Individual Statement of Services  Patient Name:  Lawrence Olson  Date:  05/09/2019  Welcome to the [facility].  Our goal is to provide you with an individualized program based on your diagnosis and situation, designed to meet your specific needs.  With this comprehensive rehabilitation program, you will be expected to participate in at least 3 hours of rehabilitation therapies Monday-Friday, with modified therapy programming on the weekends.  Your rehabilitation program will include the following services:  Physical Therapy (PT), Occupational Therapy (OT), Speech Therapy (ST), 24 hour per day rehabilitation nursing, Therapeutic Recreaction (TR), Neuropsychology, Case Management (Social Worker), Rehabilitation Medicine, Nutrition Services and Pharmacy Services  Weekly team conferences will be held on Tuesdays to discuss your progress.  Your Social Worker will talk with you frequently to get your input and to update you on team discussions.  Team conferences with you and your family in attendance may also be held.  Expected length of stay: 18-22 days   Overall anticipated outcome: minimal assistance  Depending on your progress and recovery, your program may change. Your Social Worker will coordinate services and will keep you informed of any changes. Your Social Worker's name and contact numbers are listed  below.  The following services may also be recommended but are not provided by the [facility] will be made to provide these services after discharge if needed.  Arrangements include referral to agencies that provide these services.  Your insurance has been verified to be:  HTA Your primary doctor is:  [facility]  Pertinent information will be shared with your doctor and your insurance company.  Social  Worker:  [place], [place] or [phone]   Information discussed with and copy given to patient by: Lennart Pall, 05/09/2019, 1:39 PM

## 2019-05-09 NOTE — Progress Notes (Signed)
[name] PHYSICAL MEDICINE & REHABILITATION PROGRESS NOTE   Subjective/Complaints: Patient seen working with therapy this morning.  He states he slept relatively well overnight, confirmed with sleep chart.  He notes right-sided chest wall discomfort after turning in bed this morning.  Discussed with therapies.  He was seen by nephrology yesterday, notes reviewed.    ROS: + Right chest wall pain.  Denies shortness of breath, nausea, vomiting, diarrhea.  Objective:    No results found. Recent Labs    05/07/19 0756  WBC 19.4*  HGB 9.9*  HCT 32.0*  PLT 850*   Recent Labs    05/08/19 0523 05/09/19 0554  NA 136 136  K 5.1 4.9  CL 103 105  CO2 21* 20*  GLUCOSE 164* 169*  BUN 84* 80*  CREATININE 2.48* 2.50*  CALCIUM 8.7* 8.8*    Intake/Output Summary (Last 24 hours) at 05/09/2019 0831 Last data filed at 05/09/2019 0830 Gross per 24 hour  Intake 773 ml  Output 700 ml  Net 73 ml     Physical Exam: Vital Signs Blood pressure 139/80, pulse 79, temperature 98.1 F (36.7 C), temperature source Oral, resp. rate 18, weight 74.7 kg, SpO2 95 %. Constitutional: No distress . Vital signs reviewed. HENT: Normocephalic.  Atraumatic. Eyes: EOMI. No discharge. Cardiovascular: No JVD. Respiratory: Normal effort.  No stridor. GI: Non-distended. Skin: Warm and dry.  Intact. Psych: Normal mood.  Normal behavior. Musc: Right anterior lateral chest wall pain with palpation. Neurologic: Alert and oriented x3 Motor: Grossly 4/5 throughout    Assessment/Plan: 1. 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  Physiatrist is providing close team supervision and 24 hour management of active medical problems listed below.  Physiatrist and rehab team continue to assess barriers to discharge/monitor patient progress toward functional and medical goals  Care  Tool:  Bathing  Bathing activity did not occur: Safety/medical concerns Body parts bathed by patient: Right arm, Left arm, Chest, Abdomen, Face   Body parts bathed by helper: Front perineal area, Buttocks, Right upper leg, Left upper leg, Right lower leg, Left lower leg     Bathing assist Assist Level: Supervision/Verbal cueing     Upper Body Dressing/Undressing Upper body dressing   What is the patient wearing?: Pull over shirt    Upper body assist Assist Level: Minimal Assistance - Patient > 75%    Lower Body Dressing/Undressing Lower body dressing      What is the patient wearing?: Incontinence brief     Lower body assist Assist for lower body dressing: Minimal Assistance - Patient > 75%     Toileting Toileting    Toileting assist Assist for toileting: Moderate Assistance - Patient 50 - 74%     Transfers Chair/bed transfer  Transfers assist     Chair/bed transfer assist level: Minimal Assistance - Patient > 75%(stand pivot with RW)     Locomotion Ambulation   Ambulation assist      Assist level: Minimal Assistance - Patient > 75% Assistive device: Walker-rolling Max distance: 124ft   Walk 10 feet activity   Assist  Walk 10 feet activity did not occur: Safety/medical concerns(decreased strength/activity tolerance)  Assist level: Minimal Assistance - Patient > 75% Assistive device: Walker-rolling   Walk 50 feet activity   Assist Walk 50 feet with 2 turns activity did not occur: Safety/medical concerns(decreased strength/activity tolerance)  Assist level: Minimal Assistance - Patient > 75% Assistive  device: Walker-rolling    Walk 150 feet activity   Assist Walk 150 feet activity did not occur: Safety/medical concerns(decreased strength/activity tolerance)  Assist level: Minimal Assistance - Patient > 75% Assistive device: Walker-rolling    Walk 10 feet on uneven surface  activity   Assist Walk 10 feet on uneven surfaces activity  did not occur: Safety/medical concerns(decreased strength/activity tolerance)         Wheelchair     Assist   Type of Wheelchair: Manual Wheelchair activity did not occur: Safety/medical concerns(decreased strength/activity tolerance)  Wheelchair assist level: Supervision/Verbal cueing Max wheelchair distance: 15    Wheelchair 50 feet with 2 turns activity    Assist    Wheelchair 50 feet with 2 turns activity did not occur: Safety/medical concerns(decreased strength/activity tolerance)   Assist Level: Supervision/Verbal cueing   Wheelchair 150 feet activity     Assist  Wheelchair 150 feet activity did not occur: Safety/medical concerns(decreased strength/activity tolerance)       Blood pressure 139/80, pulse 79, temperature 98.1 F (36.7 C), temperature source Oral, resp. rate 18, weight 74.7 kg, SpO2 95 %.  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 Antithrombotics: -DVT/anticoagulation:Pharmaceutical:Heparin -antiplatelet therapy: ASA 3. Pain Management:  Lidoderm patch added for right chest wall pain, which appears to be musculoskeletal given history and reproducibility 4. Mood:LCSW to follow for evaluation and support. Does not have full awareness of deficits. -antipsychotic agents: change day time seroquel to pm only  -sleep chart  -Increased pm seroquel to 75mg   At 2100  5. Neuropsych: This patientis not fullycapable of making decisions on hisown behalf. 6. Skin/Wound Care:Routine pressure relief measures 7. Fluids/Electrolytes/Nutrition:strict I/O. Encourage PO  -RD following for intake   -trial of megace 400mg  bid   Appears to be improving when 9/16 8.CAD with STEMI/VTarrest: Treated medically for now.  -LifeVest plan per Dr Sung Amabile and heart team -continueCoreg twice daily and Lipitor 9.Pneumonitis question  due to amiodarone: off of prednisone 10.T2DM with hyperglycemia:   -po intake?  Improving on 9/16  -Increased levemir to 7u bid  Elevated on 9/16, will consider further adjustments as necessary 11.Acute renal failure due to ischemic ATN:  -nephrology following, appreciate recs -daily labs  Creatinine 2.50 on 9/16 12. Urine retention: -continue  Flomax   13. Acute blood loss anemia:     Hemoglobin 9.9 on 9/14 14.Combined heart failure/ischemic cardiomyopathy: Monitor for signs of overload and check weights daily.    Filed Weights   05/06/19 0349 05/07/19 0302 05/09/19 0424  Weight: 80.9 kg 81.1 kg 74.7 kg    -On Coreg twice daily.   -No ACE/arms or BB due to AKI and shock  ?  Reliability on 9/16 15.Leukocytosis/Thrombocytosis: Afebrile off antibiotics. reactive/secondary to steroids.   No active signs of infection.  Afebrile  WBC 19.49/14, labs ordered for tomorrow  Platelets 850 on 9/14, labs ordered for tomorrow  16.  Constipation:     -scheduled miralax daily  LOS: 5 days A FACE TO FACE EVALUATION WAS PERFORMED  Lawrence Olson Lawrence Olson 05/09/2019, 8:31 AM

## 2019-05-09 NOTE — Progress Notes (Addendum)
[facility]    NEPHROLOGY PROGRESS NOTE  SUBJECTIVE: Continues to improve     OBJECTIVE:  Vitals:   05/09/19 0424 05/09/19 1350  BP: 139/80 133/79  Pulse: 79 71  Resp: 18   Temp: 98.1 F (36.7 C) 98.1 F (36.7 C)  SpO2: 95% 97%    Intake/Output Summary (Last 24 hours) at 05/09/2019 1739 Last data filed at 05/09/2019 1320 Gross per 24 hour  Intake 777 ml  Output 400 ml  Net 377 ml      General: resting, NAD, able to lay flat Neck:  No JVD CV:  Heart RRR  Lungs:  L/S CTA bilaterally Abd:  abd SNT/ND with normal BS, some small hematomas at sites of heparin injections. Extremities: no bilateral LE edema.  MEDICATIONS:  . aspirin  81 mg Oral Daily  . atorvastatin  80 mg Oral q1800  . B-complex with vitamin C  1 tablet Oral Daily  . carvedilol  3.125 mg Oral BID WC  . chlorhexidine  15 mL Mouth Rinse BID  . Chlorhexidine Gluconate Cloth  6 each Topical Daily  . famotidine  20 mg Oral Daily  . feeding supplement (PRO-STAT SUGAR FREE 64)  30 mL Oral BID  . heparin  5,000 Units Subcutaneous Q8H  . insulin detemir  7 Units Subcutaneous Q12H  . lidocaine  1 patch Transdermal Q24H  . mouth rinse  15 mL Mouth Rinse q12n4p  . megestrol  400 mg Oral Daily  . polyethylene glycol  17 g Oral Daily  . Ensure Max Protein  11 oz Oral Daily  . QUEtiapine  75 mg Oral QHS  . sodium chloride flush  10-40 mL Intracatheter Q12H  . tamsulosin  0.4 mg Oral QPC supper       LABS:   CBC Latest Ref Rng & Units 05/07/2019 05/05/2019 05/04/2019  WBC 4.0 - 10.5 K/uL 19.4(H) 17.0(H) 23.8(H)  Hemoglobin 13.0 - 17.0 g/dL 9.9(L) 9.1(L) 8.5(L)  Hematocrit 39.0 - 52.0 % 32.0(L) 29.0(L) 26.5(L)  Platelets 150 - 400 K/uL 850(H) 865(H) 896(H)    CMP Latest Ref Rng & Units 05/09/2019 05/08/2019 05/07/2019  Glucose 70 - 99 mg/dL 169(H) 164(H) 145(H)  BUN 8 - 23 mg/dL 80(H) 84(H) 96(H)  Creatinine 0.61 - 1.24 mg/dL 2.50(H) 2.48(H) 2.70(H)  Sodium 135 - 145 mmol/L 136 136 139   Potassium 3.5 - 5.1 mmol/L 4.9 5.1 5.5(H)  Chloride 98 - 111 mmol/L 105 103 105  CO2 22 - 32 mmol/L 20(L) 21(L) 24  Calcium 8.9 - 10.3 mg/dL 8.8(L) 8.7(L) 8.9  Total Protein 6.5 - 8.1 g/dL - - -  Total Bilirubin 0.3 - 1.2 mg/dL - - -  Alkaline Phos 38 - 126 U/L - - -  AST 15 - 41 U/L - - -  ALT 0 - 44 U/L - - -    Lab Results  Component Value Date   CALCIUM 8.8 (L) 05/09/2019   CAION 1.12 (L) 05/01/2019   PHOS 6.1 (H) 05/04/2019       Component Value Date/Time   COLORURINE YELLOW 04/26/2019 1136   COLORURINE YELLOW 04/26/2019 1136   APPEARANCEUR CLOUDY (A) 04/26/2019 1136   APPEARANCEUR CLEAR 04/26/2019 1136   LABSPEC 1.011 04/26/2019 1136   LABSPEC 1.020 04/26/2019 1136   PHURINE 5.0 04/26/2019 1136   PHURINE 5.5 04/26/2019 1136   GLUCOSEU NEGATIVE 04/26/2019 1136   GLUCOSEU NEGATIVE 04/26/2019 1136   HGBUR MODERATE (A) 04/26/2019 1136   HGBUR MODERATE (A) 04/26/2019 1136   BILIRUBINUR  NEGATIVE 04/26/2019 1136   BILIRUBINUR NEGATIVE 04/26/2019 1136   KETONESUR NEGATIVE 04/26/2019 1136   [place] 04/26/2019 1136   PROTEINUR NEGATIVE 04/26/2019 1136   PROTEINUR NEGATIVE 04/26/2019 1136   NITRITE NEGATIVE 04/26/2019 1136   NITRITE NEGATIVE 04/26/2019 1136   LEUKOCYTESUR NEGATIVE 04/26/2019 1136   LEUKOCYTESUR TRACE (A) 04/26/2019 1136      Component Value Date/Time   PHART 7.356 05/01/2019 0611   PCO2ART 46.4 05/01/2019 0611   PO2ART 107.0 05/01/2019 0611   HCO3 26.1 05/01/2019 0611   TCO2 28 05/01/2019 0611   ACIDBASEDEF 5.0 (H) 04/17/2019 0620   O2SAT 56.7 05/06/2019 0400    No results found for: IRON, TIBC, FERRITIN, IRONPCTSAT     ASSESSMENT/PLAN:     1. Nonoliguric dialysis dependent ischemic ATN, requiring CRRT 9/3 to 9/8: Appears to be recovering GFR.   Azotemia reflects low GFR and catabolic effect of steroids.  He had a fairly normal creatinine at the time of presentation.  Creatinine continues to downtrend.  Temporary catheter removed. Cr  and BUN downtrend, K is great.  Likely will not need further dialysis.  If pt has residual CKD after can be referred to our clinic for OP f/u.  Please avoid all nephrotoxins including Fleets enemas. Nothing to add further.  Will s/o.   2. Status post cardiac arrest, STEMI with lesion not amenable to PCI, ischemic cardiomyopathy with systolic heart failure and shock. As above. Advanced heart failure following.  Much improved. 3. 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 Anemia, hemoglobin stable--> has some hematomas at site of heparin injections, would watch.   5. Hyperphosphatemia.  Should improve with recovering renal function.

## 2019-05-09 NOTE — Progress Notes (Signed)
Occupational Therapy Session Note  Patient Details  Name: Lawrence Olson [name] [id number] Date of Birth: Mar 27, 1951  Today's Date: 05/09/2019 OT Individual Time: 2060-1561 OT Individual Time Calculation (min): 56 min    Short Term Goals: Week 1:  OT Short Term Goal 1 (Week 1): Pt will complete bathing with mod assist at sit > stand level with mod cues for initiation/sequencing OT Short Term Goal 2 (Week 1): Pt will complete UB dressing with mod assist with mod cues for initiation/sequencing OT Short Term Goal 3 (Week 1): Pt will complete LB dressing with max assist OT Short Term Goal 4 (Week 1): Pt will complete 1 grooming task in standing with mod assist for standing balance  Skilled Therapeutic Interventions/Progress Updates:    Pt received supine c/o being cold but no pain. Upon getting EOB and transferring into shower, pt c/o chest pain, described as sore, diffuse across chest, MD entering room and aware of this. Pain subsiding upon return to bed. RN alerted at end of session. Pt on RA throughout session with no desaturation. Pt completed bed mobility to EOB with min A. He used RW to complete ambulatory transfer into bathroom and onto TTB in shower with CGA. Pt completed UB bathing and hair washing with (S), declining all LB bathing. Pt donned shorts with min A for threading LLE. Min A to don shirt. Pt returned to supine in bed to eat breakfast. Full supervision for alternating solids/liquids. Min cueing required. Pt completed BUE [place] and gross grasp strength training with green foam blocks. Pt has increased [facility] skills and strength and I believe he would be able to now manage lifevest button. Pt left supine with all needs met, bed alarm set.   Therapy Documentation Precautions:  Precautions Precautions: Fall Restrictions Weight Bearing Restrictions: No    Therapy/Group: Individual Therapy  Curtis Sites 05/09/2019, 7:09 AM

## 2019-05-10 ENCOUNTER — Inpatient Hospital Stay (HOSPITAL_COMMUNITY): Payer: PPO

## 2019-05-10 ENCOUNTER — Inpatient Hospital Stay (HOSPITAL_COMMUNITY): Payer: PPO | Admitting: Speech Pathology

## 2019-05-10 ENCOUNTER — Inpatient Hospital Stay (HOSPITAL_COMMUNITY): Payer: PPO | Admitting: Occupational Therapy

## 2019-05-10 LAB — CBC WITH DIFFERENTIAL/PLATELET
Abs Immature Granulocytes: 0.1 10*3/uL — ABNORMAL HIGH (ref 0.00–0.07)
Basophils Absolute: 0 10*3/uL (ref 0.0–0.1)
Basophils Relative: 0 %
Eosinophils Absolute: 0.8 10*3/uL — ABNORMAL HIGH (ref 0.0–0.5)
Eosinophils Relative: 5 %
HCT: 31.3 % — ABNORMAL LOW (ref 39.0–52.0)
Hemoglobin: 10.3 g/dL — ABNORMAL LOW (ref 13.0–17.0)
Immature Granulocytes: 1 %
Lymphocytes Relative: 19 %
Lymphs Abs: 3.1 10*3/uL (ref 0.7–4.0)
MCH: 31.4 pg (ref 26.0–34.0)
MCHC: 32.9 g/dL (ref 30.0–36.0)
MCV: 95.4 fL (ref 80.0–100.0)
Monocytes Absolute: 1.3 10*3/uL — ABNORMAL HIGH (ref 0.1–1.0)
Monocytes Relative: 8 %
Neutro Abs: 10.5 10*3/uL — ABNORMAL HIGH (ref 1.7–7.7)
Neutrophils Relative %: 67 %
Platelets: 658 10*3/uL — ABNORMAL HIGH (ref 150–400)
RBC: 3.28 MIL/uL — ABNORMAL LOW (ref 4.22–5.81)
RDW: 15.1 % (ref 11.5–15.5)
WBC: 15.8 10*3/uL — ABNORMAL HIGH (ref 4.0–10.5)
nRBC: 0 % (ref 0.0–0.2)

## 2019-05-10 LAB — BASIC METABOLIC PANEL
Anion gap: 11 (ref 5–15)
BUN: 70 mg/dL — ABNORMAL HIGH (ref 8–23)
CO2: 22 mmol/L (ref 22–32)
Calcium: 8.9 mg/dL (ref 8.9–10.3)
Chloride: 103 mmol/L (ref 98–111)
Creatinine, Ser: 2.41 mg/dL — ABNORMAL HIGH (ref 0.61–1.24)
GFR calc Af Amer: 31 mL/min — ABNORMAL LOW (ref >60)
GFR calc non Af Amer: 27 mL/min — ABNORMAL LOW (ref >60)
Glucose, Bld: 113 mg/dL — ABNORMAL HIGH (ref 70–99)
Potassium: 5.1 mmol/L (ref 3.5–5.1)
Sodium: 136 mmol/L (ref 135–145)

## 2019-05-10 LAB — GLUCOSE, CAPILLARY
Glucose-Capillary: 102 mg/dL — ABNORMAL HIGH (ref 70–99)
Glucose-Capillary: 166 mg/dL — ABNORMAL HIGH (ref 70–99)

## 2019-05-10 MED ORDER — INSULIN DETEMIR 100 UNIT/ML ~~LOC~~ SOLN
10.0000 [IU] | Freq: Two times a day (BID) | SUBCUTANEOUS | Status: DC
  Administered 2019-05-10 – 2019-05-13 (×7): 10 [IU] via SUBCUTANEOUS
  Filled 2019-05-10 (×9): qty 0.1

## 2019-05-10 NOTE — Plan of Care (Signed)
Nutrition Education Note  RD consulted for nutrition education regarding diabetes and a heart healthy diet.  Lab Results  Component Value Date   HGBA1C 7.2 (H) 04/19/2019    RD provided "Carbohydrate Counting for People with Diabetes" handout from the Academy of Nutrition and Dietetics. Discussed different food groups and their effects on blood sugar, emphasizing carbohydrate-containing foods. Provided list of carbohydrates and recommended serving sizes of common foods.  Discussed importance of controlled and consistent carbohydrate intake throughout the day. Provided examples of ways to balance meals/snacks and encouraged intake of high-fiber, whole grain complex carbohydrates. Teach back method used.  RD provided "Low Sodium Nutrition Therapy" handout from the Academy of Nutrition and Dietetics. Reviewed patient's dietary recall. Provided examples on ways to decrease sodium intake in diet. Discouraged intake of processed foods and use of salt shaker. Encouraged fresh fruits and vegetables as well as whole grain sources of carbohydrates to maximize fiber intake.   RD discussed why it is important for patient to adhere to diet recommendations, and emphasized the role of fluids, foods to avoid, and importance of weighing self daily. Teach back method used.  Expect good compliance.  Body mass index is 26.58 kg/m. Pt meets criteria for overweight based on current BMI.  Current diet order is Dysphagia 3, patient is consuming approximately 100% of meals at this time. Labs and medications reviewed. No further nutrition interventions warranted at this time. RD contact information provided. If additional nutrition issues arise, please re-consult RD.    Gaynell Face, MS, RD, LDN Inpatient Clinical Dietitian Pager: [phone] Weekend/After Hours: [phone]

## 2019-05-10 NOTE — Progress Notes (Addendum)
Patient ID: Lawrence Olson, male   DOB: 06/03/1951, 68 y.o.   [name] [id number]   Advanced Heart Failure Team  Note   Primary Physician: Patient, No Pcp Per PCP-Cardiologist:  No primary care provider on file.  HPI:    Moved to CIR on 9/11  Feeling a little stronger today. Denies CP or SOB. Hungry.   Objective:    Vital Signs:   Temp:  [97.5 F (36.4 C)-98.1 F (36.7 C)] 97.9 F (36.6 C) (09/17 0250) Pulse Rate:  [70-75] 75 (09/17 0250) Resp:  [14-18] 14 (09/17 0250) BP: (109-133)/(71-79) 109/71 (09/17 0250) SpO2:  [92 %-97 %] 92 % (09/17 0250) Last BM Date: 05/09/19  Weight change: Filed Weights   05/06/19 0349 05/07/19 0302 05/09/19 0424  Weight: 80.9 kg 81.1 kg 74.7 kg    Intake/Output:   Intake/Output Summary (Last 24 hours) at 05/10/2019 0843 Last data filed at 05/10/2019 0746 Gross per 24 hour  Intake 600 ml  Output 1175 ml  Net -575 ml      Physical Exam   General:  No resp difficulty. Sitting in the wheel chair.  HEENT: normal Neck: supple. no JVD. Carotids 2+ bilat; no bruits. No lymphadenopathy or thryomegaly appreciated. Cor: PMI nondisplaced. Regular rate & rhythm. No rubs, gallops or murmurs. Lungs: clear Abdomen: soft, nontender, nondistended. No hepatosplenomegaly. No bruits or masses. Good bowel sounds. Extremities: no cyanosis, clubbing, rash, edema Neuro: alert & orientedx3, cranial nerves grossly intact. moves all 4 extremities w/o difficulty. Affect pleasant  NOT On Telemetry   Labs   Basic Metabolic Panel: Recent Labs  Lab 05/04/19 0521  05/06/19 0805 05/07/19 0756 05/08/19 0523 05/09/19 0554 05/10/19 0704  NA 144   < > 139 139 136 136 136  K 4.2   < > 5.2* 5.5* 5.1 4.9 5.1  CL 110   < > 107 105 103 105 103  CO2 24   < > 23 24 21* 20* 22  GLUCOSE 86   < > 145* 145* 164* 169* 113*  BUN 110*   < > 100* 96* 84* 80* 70*  CREATININE 3.10*   < > 2.72* 2.70* 2.48* 2.50* 2.41*  CALCIUM 8.4*   < > 8.8* 8.9 8.7* 8.8* 8.9  PHOS 6.1*  --    --   --   --   --   --    < > = values in this interval not displayed.    Liver Function Tests: Recent Labs  Lab 05/04/19 0521 05/05/19 2357  AST  --  23  ALT  --  57*  ALKPHOS  --  80  BILITOT  --  1.0  PROT  --  6.6  ALBUMIN 2.3* 2.4*   No results for input(s): LIPASE, AMYLASE in the last 168 hours. No results for input(s): AMMONIA in the last 168 hours.  CBC: Recent Labs  Lab 05/04/19 0521 05/05/19 2357 05/07/19 0756 05/10/19 0704  WBC 23.8* 17.0* 19.4* 15.8*  NEUTROABS  --  14.3*  --  10.5*  HGB 8.5* 9.1* 9.9* 10.3*  HCT 26.5* 29.0* 32.0* 31.3*  MCV 96.7 96.3 97.9 95.4  PLT 896* 865* 850* 658*    Cardiac Enzymes: Recent Labs  Lab 05/05/19 2357  CKTOTAL 21*    BNP: BNP (last 3 results) No results for input(s): BNP in the last 8760 hours.  ProBNP (last 3 results) No results for input(s): PROBNP in the last 8760 hours.   CBG: Recent Labs  Lab 05/08/19 1701 05/08/19 2058 05/09/19  8466 05/09/19 1119 05/10/19 0707  GLUCAP 174* 207* 168* 234* 102*    Coagulation Studies: No results for input(s): LABPROT, INR in the last 72 hours.   Imaging   No results found.   Medications:     Current Medications: . aspirin  81 mg Oral Daily  . atorvastatin  80 mg Oral q1800  . B-complex with vitamin C  1 tablet Oral Daily  . carvedilol  3.125 mg Oral BID WC  . chlorhexidine  15 mL Mouth Rinse BID  . Chlorhexidine Gluconate Cloth  6 each Topical Daily  . famotidine  20 mg Oral Daily  . feeding supplement (PRO-STAT SUGAR FREE 64)  30 mL Oral BID  . heparin  5,000 Units Subcutaneous Q8H  . insulin detemir  7 Units Subcutaneous Q12H  . lidocaine  1 patch Transdermal Q24H  . mouth rinse  15 mL Mouth Rinse q12n4p  . megestrol  400 mg Oral Daily  . polyethylene glycol  17 g Oral Daily  . Ensure Max Protein  11 oz Oral Daily  . QUEtiapine  75 mg Oral QHS  . sodium chloride flush  10-40 mL Intracatheter Q12H  . tamsulosin  0.4 mg Oral QPC supper     Infusions:   Assessment/Plan   1. CAD: Anterolateral STEMI, cath 8/24 showed occluded proximal LAD, unable to open.  Once we see how he recovers neurologically, will need to decide on feasibility of re-attempt PCI versus CABG. Medical therapy for the forseeable future. - No chest pain.  - Continue medical therapy with ASA + statin and carvedilol   2. Acute systolic HF - Echo with EF 25%, normal RV.  - Stable from HF perspective.  - No ACE/ARB/ARNI with AKI - On low dose carvedilol  - Volume status stable - Consider hyrdal nitrates over next few days but do not want to drop BP too much in setting of recovering kidney function   3. AKI:  -Rena functions continues to trend down.  - Plans to follow up as an outpt. No plan for HD.  - K 5.1.  4. Cardiac arrest/VF:  - Will need LifeVest once strong enough to push alert deactivation button. Suspect this will be int he next day or two - Keep K > 4.0/Mg 2.0  5. Severe Deconditioning.  Getting stronger with PT/OT/Speech   Amy Clegg NP-C  05/10/2019 8:43 AM   Patient seen and examined with the above-signed Advanced Practice Provider and/or Housestaff. I personally reviewed laboratory data, imaging studies and relevant notes. I independently examined the patient and formulated the important aspects of the plan. I have edited the note to reflect any of my changes or salient points. I have personally discussed the plan with the patient and/or family.  He continues to improve rapidly. No CP or SOB. Strength clearly improved. Creatinine trending down slowly. Now 2.4. K up to 5.1. Would continue to follow daily. Volume status looks ok. Hopefully we can get Lifevest placed soon.   Glori Bickers, MD  5:55 PM

## 2019-05-10 NOTE — Progress Notes (Signed)
Occupational Therapy Session Note  Patient Details  Name: Lawrence Olson [name] [id number] Date of Birth: [birth year]  Today's Date: 05/10/2019 OT Individual Time: 1015-1100 OT Individual Time Calculation (min): 45 min    Short Term Goals: Week 1:  OT Short Term Goal 1 (Week 1): Pt will complete bathing with mod assist at sit > stand level with mod cues for initiation/sequencing OT Short Term Goal 2 (Week 1): Pt will complete UB dressing with mod assist with mod cues for initiation/sequencing OT Short Term Goal 3 (Week 1): Pt will complete LB dressing with max assist OT Short Term Goal 4 (Week 1): Pt will complete 1 grooming task in standing with mod assist for standing balance  Skilled Therapeutic Interventions/Progress Updates:    1:1 Wife reports she assist with washing his lower body and changing LB clothing in bed this morning after urinating in urinal. Pt was able to don socks and shoes with extra time with Le propped up on bed.   Therapeutic activity with focus on activity tolerance/ strength training. Min A transfer to w/c from EOB. Perform 10 step ups onto 4 inch step and then back down with min A for balance. Pt with more difficulty stepping down backwards requiring more time and balance to step all the way off step. Then perform bounce passes with rebounder with regular playground ball and then 2.2 lb weighted ball. Pt requires prolonged seated rest breaks after each activity. Pt then ambulated from gym all the way back to his room ~252 feet with RW with contact guard. PT with increased fatigue and requested to lay back down. Pt reported being too tired to doff his shoes . Pt able to get back into bed and supine with contact guard.   Wife present for session.   Therapy Documentation Precautions:  Precautions Precautions: Fall Restrictions Weight Bearing Restrictions: No RLE Weight Bearing: Non weight bearing LLE Weight Bearing: Non weight bearing General: General OT Amount of  Missed Time: 15 Minutes due to fatigue and wanting to rest in the bed Vital Signs: Therapy Vitals Temp: 98 F (36.7 C) Temp Source: Oral Pulse Rate: 66 Resp: 17 BP: 123/77 Patient Position (if appropriate): Lying Oxygen Therapy SpO2: 97 % O2 Device: Room Air Pain:  no c/o pain in session   Therapy/Group: Individual Therapy  Willeen Cass [facility] 05/10/2019, 4:33 PM

## 2019-05-10 NOTE — Progress Notes (Signed)
Patient refuses megace, protein claims he is eating better prefers ensure max.

## 2019-05-10 NOTE — Progress Notes (Addendum)
Physical Therapy Session Note  Patient Details  Name: Lawrence Olson [name] [id number] Date of Birth: Feb 08, 1951  Today's Date: 05/10/2019 PT Individual Time: 1300-1400 PT Individual Time Calculation (min): 60 min   Short Term Goals: Week 1:  PT Short Term Goal 1 (Week 1): Patient will perform rolling and supine to sit with min A. PT Short Term Goal 2 (Week 1): Patient will perform dynamic sitting balance with CGA. PT Short Term Goal 3 (Week 1): Patient will perform basic transfers with mod A of 1 person. PT Short Term Goal 4 (Week 1): Patient will initiate pre-gait or gait training.     Skilled Therapeutic Interventions/Progress Updates:   Pt seated in w/c.  Wife present to translate.    W/c propulsion using bil UEs for activity tolerance, x 100' iwht supervision.    Simulated car transfer wihtout AD to 20" high seat, min assist.  Strengthening in sitting: trunk flexion/extension while passing a large ball back and forth with PT.  Sustained stretch bil heel cords and hamstrings standing with bil feet on red wedge, x 2 minutes with bil UE support> 0UE support.  Without UE support, pt needed min assist intermittently for LOB anterior or posterior.  Therapeutic activity of 2 bouts of tossing 6 horseshoes in standing iwht CGA, retrieving them from floor with min assist for balance with use of RW.  Gait training with RW on level tile with CGA x 150', x 100', min >CGA.  On turns, pt tends to have narrow BOS, nearly scissoring feet.  Advanced gait ambulating backwards iwht RW x 8' x 3.   Stand pivot to return to bed.  Pt sitting on bed with alarm set and wife present, at end of session. Therapy Documentation Precautions:  Precautions Precautions: Fall Restrictions Weight Bearing Restrictions: No    Pain: pt denies         Therapy/Group: Individual Therapy  Dylen Mcelhannon 05/10/2019, 4:24 PM

## 2019-05-10 NOTE — Progress Notes (Signed)
Speech Language Pathology Daily Session Note  Patient Details  Name: Lawrence Olson [name] [id number] Date of Birth: 1951/04/09  Today's Date: 05/10/2019 SLP Individual Time: 0826-0925 SLP Individual Time Calculation (min): 59 min  Short Term Goals: Week 1: SLP Short Term Goal 1 (Week 1): Pt will consume dys 2 textures and thin liquids with supervision cues for use of swallowing precautions and minimal overt s/s of aspiration. SLP Short Term Goal 2 (Week 1): Pt will consume therapeutic trials of dys 3 textures with supervision cues for use of swallowing precautions and minimal overt s/s of afspiration over 3 consecutive sessions prior to diet advancement. SLP Short Term Goal 3 (Week 1): Pt will utilize external aids to recall basic, daily information with mod assist verbal cues. SLP Short Term Goal 4 (Week 1): Pt will selectively attend to basic tasks for 15 minutes in the presence of mild environmental distractions with min verbal cues for redirection. SLP Short Term Goal 5 (Week 1): Pt will complete basic tasks with mod assist verbal cues for functional problem solving.  Skilled Therapeutic Interventions: Pt was seen for skilled ST services focused on dysphagia and cognition. Pt's wife was present and provided interpretation in pt's preferred language as needed. SLP facilitated session with skilled observation of pt consuming upgraded dysphagia 3 (mech soft) solids with thin liquids. Pt demonstrated awareness and use of his swallow precautions with Supervision A verbal prompts from clinician. Efficient mastication and thorough oral clearance achieved with liquid wash. Recommend pt upgrade to D3 solids, continue thin liquids and pt is appropriate to decrease to intermittent supervision during meals. SLP also facilitated session with Mod faded to [place] A verbal and visual cues for organization and problem solving during a complex money management task (checkbook balance). Pt demonstrated good insight into  difficulty of this task and reported he appreciated the chance to work on these challenging skills while in [place] given he would like to get back to managing finances for his family when able. Pt's wife reported she can provide support for complex tasks such as this upon return home. Pt was left sitting in wheelchair with seatbelt alarm engaged and all needs within reach. Continue per current plan of care.      Pain Pain Assessment Pain Score: 0-No pain  Therapy/Group: Individual Therapy  Arbutus Leas 05/10/2019, 9:31 AM

## 2019-05-10 NOTE — Progress Notes (Signed)
[place] PHYSICAL MEDICINE & REHABILITATION PROGRESS NOTE   Subjective/Complaints: Says nights are "long" but that he's sleeping better. No pain. Stamina getting better  ROS: Patient denies fever, rash, sore throat, blurred vision, nausea, vomiting, diarrhea, cough, shortness of breath or chest pain, joint or back pain, headache, or mood change.    Objective:    No results found. Recent Labs    05/10/19 0704  WBC 15.8*  HGB 10.3*  HCT 31.3*  PLT 658*   Recent Labs    05/09/19 0554 05/10/19 0704  NA 136 136  K 4.9 5.1  CL 105 103  CO2 20* 22  GLUCOSE 169* 113*  BUN 80* 70*  CREATININE 2.50* 2.41*  CALCIUM 8.8* 8.9    Intake/Output Summary (Last 24 hours) at 05/10/2019 0846 Last data filed at 05/10/2019 0746 Gross per 24 hour  Intake 600 ml  Output 1175 ml  Net -575 ml     Physical Exam: Vital Signs Blood pressure 109/71, pulse 75, temperature 97.9 F (36.6 C), temperature source Oral, resp. rate 14, weight 74.7 kg, SpO2 92 %. Constitutional: No distress . Vital signs reviewed. HEENT: EOMI, oral membranes moist Neck: supple Cardiovascular: RRR without murmur. No JVD    Respiratory: CTA Bilaterally without wheezes or rales. Normal effort    GI: BS +, non-tender, non-distended  Skin: Warm and dry.  Intact. Psych: Normal mood.  Normal behavior. Less anxious Musc: Right anterior lateral chest wall pain with palpation. Neurologic: Alert and oriented x3 Motor: Grossly 4 to 4+/5 throughout    Assessment/Plan: 1. 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  Physiatrist is providing close team supervision and 24 hour management of active medical problems listed below.  Physiatrist and rehab team continue to assess barriers to discharge/monitor patient progress toward functional and medical goals  Care Tool:  Bathing  Bathing activity  did not occur: Safety/medical concerns Body parts bathed by patient: Right arm, Left arm, Chest, Abdomen, Face   Body parts bathed by helper: Front perineal area, Buttocks, Right upper leg, Left upper leg, Right lower leg, Left lower leg     Bathing assist Assist Level: Supervision/Verbal cueing     Upper Body Dressing/Undressing Upper body dressing   What is the patient wearing?: Pull over shirt    Upper body assist Assist Level: Minimal Assistance - Patient > 75%    Lower Body Dressing/Undressing Lower body dressing      What is the patient wearing?: Incontinence brief     Lower body assist Assist for lower body dressing: Minimal Assistance - Patient > 75%     Toileting Toileting    Toileting assist Assist for toileting: Moderate Assistance - Patient 50 - 74%     Transfers Chair/bed transfer  Transfers assist     Chair/bed transfer assist level: Contact Guard/Touching assist     Locomotion Ambulation   Ambulation assist      Assist level: Moderate Assistance - Patient 50 - 74%(no AD) Assistive device: Hand held assist Max distance: 200   Walk 10 feet activity   Assist  Walk 10 feet activity did not occur: Safety/medical concerns(decreased strength/activity tolerance)  Assist level: Moderate Assistance - Patient - 50 - 74% Assistive device: Hand held assist   Walk 50 feet activity   Assist Walk 50 feet with 2 turns activity did not occur: Safety/medical concerns(decreased strength/activity tolerance)  Assist level: Moderate Assistance - Patient -  50 - 74% Assistive device: Hand held assist    Walk 150 feet activity   Assist Walk 150 feet activity did not occur: Safety/medical concerns(decreased strength/activity tolerance)  Assist level: Moderate Assistance - Patient - 50 - 74% Assistive device: Hand held assist    Walk 10 feet on uneven surface  activity   Assist Walk 10 feet on uneven surfaces activity did not occur: Safety/medical  concerns(decreased strength/activity tolerance)         Wheelchair     Assist   Type of Wheelchair: Manual Wheelchair activity did not occur: Safety/medical concerns(decreased strength/activity tolerance)  Wheelchair assist level: Supervision/Verbal cueing Max wheelchair distance: 15    Wheelchair 50 feet with 2 turns activity    Assist    Wheelchair 50 feet with 2 turns activity did not occur: Safety/medical concerns(decreased strength/activity tolerance)   Assist Level: Supervision/Verbal cueing   Wheelchair 150 feet activity     Assist  Wheelchair 150 feet activity did not occur: Safety/medical concerns(decreased strength/activity tolerance)       Blood pressure 109/71, pulse 75, temperature 97.9 F (36.6 C), temperature source Oral, resp. rate 14, weight 74.7 kg, SpO2 92 %.  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 Antithrombotics: -DVT/anticoagulation:Pharmaceutical:Heparin -antiplatelet therapy: ASA 3. Pain Management:  Lidoderm patch added for right chest wall pain, which appears to be musculoskeletal given history and reproducibility 4. Mood:LCSW to follow for evaluation and support. Does not have full awareness of deficits. -antipsychotic agents: change day time seroquel to pm only  -sleep chart  -Increased pm seroquel to 75mg   At 2100 with good results 5. Neuropsych: This patientis not fullycapable of making decisions on hisown behalf. 6. Skin/Wound Care:Routine pressure relief measures 7. Fluids/Electrolytes/Nutrition:strict I/O. Encourage PO  -RD following for intake   -trial of megace 400mg  bid   9/17 eating 100% now   Reduce megace to daily 8.CAD with STEMI/VTarrest: Treated medically for now.  -LifeVest plan per Dr Sung Amabile this  week -continueCoreg twice daily and Lipitor 9.Pneumonitis question due to amiodarone: off of prednisone 10.T2DM with hyperglycemia:   -po intake has improved which is elevating cbg's  -he's on CM diet  -Increase levemir to 10u bid  -was not on any meds for diabetes PTA   -will check hgbA1C in am  11.Acute renal failure due to ischemic ATN:  -nephrology following, appreciate recs -daily labs  Creatinine 2.41 9/17 12. Urine retention: -continue  Flomax   13. Acute blood loss anemia:     Hemoglobin 9.9 on 9/14 14.Combined heart failure/ischemic cardiomyopathy: Monitor for signs of overload and check weights daily.    Filed Weights   05/06/19 0349 05/07/19 0302 05/09/19 0424  Weight: 80.9 kg 81.1 kg 74.7 kg    -On Coreg twice daily.   -No ACE/arms or BB due to AKI and shock  ?  Reliability on 9/16. No weight yet today  -no edema, lungs clear 15.Leukocytosis/Thrombocytosis: Afebrile off antibiotics. reactive/secondary to steroids.   No active signs of infection.  Afebrile  WBC down to 15.8 today 9/17  Platelets 658 today 9/17  16.  Constipation:     -scheduled miralax daily--had bm 9/16  LOS: 6 days A FACE TO FACE EVALUATION WAS PERFORMED  Meredith Staggers 05/10/2019, 8:46 AM

## 2019-05-11 ENCOUNTER — Inpatient Hospital Stay (HOSPITAL_COMMUNITY): Payer: PPO | Admitting: Occupational Therapy

## 2019-05-11 ENCOUNTER — Inpatient Hospital Stay (HOSPITAL_COMMUNITY): Payer: PPO | Admitting: Speech Pathology

## 2019-05-11 ENCOUNTER — Inpatient Hospital Stay (HOSPITAL_COMMUNITY): Payer: PPO | Admitting: Physical Therapy

## 2019-05-11 LAB — BASIC METABOLIC PANEL
Anion gap: 9 (ref 5–15)
BUN: 66 mg/dL — ABNORMAL HIGH (ref 8–23)
CO2: 22 mmol/L (ref 22–32)
Calcium: 8.6 mg/dL — ABNORMAL LOW (ref 8.9–10.3)
Chloride: 103 mmol/L (ref 98–111)
Creatinine, Ser: 2.51 mg/dL — ABNORMAL HIGH (ref 0.61–1.24)
GFR calc Af Amer: 29 mL/min — ABNORMAL LOW (ref >60)
GFR calc non Af Amer: 25 mL/min — ABNORMAL LOW (ref >60)
Glucose, Bld: 125 mg/dL — ABNORMAL HIGH (ref 70–99)
Potassium: 4.4 mmol/L (ref 3.5–5.1)
Sodium: 134 mmol/L — ABNORMAL LOW (ref 135–145)

## 2019-05-11 LAB — MAGNESIUM: Magnesium: 1.9 mg/dL (ref 1.7–2.4)

## 2019-05-11 MED ORDER — MAGNESIUM SULFATE 2 GM/50ML IV SOLN
2.0000 g | Freq: Once | INTRAVENOUS | Status: AC
  Administered 2019-05-11: 2 g via INTRAVENOUS
  Filled 2019-05-11: qty 50

## 2019-05-11 NOTE — Progress Notes (Signed)
Occupational Therapy Session Note  Patient Details  Name: Lawrence Olson [name] [id number] Date of Birth: [birth year]  Today's Date: 05/11/2019 OT Individual Time: 9381-8299 OT Individual Time Calculation (min): 60 min    Short Term Goals: Week 1:  OT Short Term Goal 1 (Week 1): Pt will complete bathing with mod assist at sit > stand level with mod cues for initiation/sequencing OT Short Term Goal 2 (Week 1): Pt will complete UB dressing with mod assist with mod cues for initiation/sequencing OT Short Term Goal 3 (Week 1): Pt will complete LB dressing with max assist OT Short Term Goal 4 (Week 1): Pt will complete 1 grooming task in standing with mod assist for standing balance  Skilled Therapeutic Interventions/Progress Updates:    1:1 Pt declined showering today and again wife reported bathing his LB and changing his clothes. Promoted him doing tasks for himself but he reports just being too tired with therapy. Pt donned shoes with extra time and asked therapist to fasten one shoe due to fatigue. Pt ambulated to the ADL apartment with steady A with RW. REviewed and confirmed they would like a tub bench- reported to SW. Pt able to perform transfer into the tub with tub bench with steadying A. Pt with heavy reliance on bed rails in the room. Performed bed mobility in apartment bed without rails and required steady A. Pt reports "all this practice doesn't help me." I just need to rest. Willing to continue. Pt ambulated without AD to the elevators with min A with a narrow BOS with minor LOB requiring A to correct. Ambulated ~75 feet and then another ~45 feet to the Nustep. Performed 5 min on the Nustep on reistance level 4 with a BORG score of 13. Ambulated all the way back to his room from dayroom with RW with steadying A- RW helps with balance and energy conservation - without rest breaks. Left resting in the recliner with wife present.   Therapy Documentation Precautions:  Precautions Precautions:  Fall Restrictions Weight Bearing Restrictions: No RLE Weight Bearing: Non weight bearing LLE Weight Bearing: Non weight bearing General:   Vital Signs: Therapy Vitals Temp: 97.9 F (36.6 C) Temp Source: Oral Pulse Rate: 71 Resp: 18 BP: 122/75 Patient Position (if appropriate): Sitting Oxygen Therapy SpO2: 97 % O2 Device: Room Air Pain: Pain Assessment Pain Score: 0-No pain   Therapy/Group: Individual Therapy  Willeen Cass [facility] 05/11/2019, 3:03 PM

## 2019-05-11 NOTE — Progress Notes (Signed)
Physical Therapy Session Note  Patient Details  Name: Lawrence Olson [name] [id number] Date of Birth: Jan 18, 1951  Today's Date: 05/11/2019 PT Individual Time: 1400-1500 PT Individual Time Calculation (min): 60 min   Short Term Goals: Week 1:  PT Short Term Goal 1 (Week 1): Patient will perform rolling and supine to sit with min A. PT Short Term Goal 2 (Week 1): Patient will perform dynamic sitting balance with CGA. PT Short Term Goal 3 (Week 1): Patient will perform basic transfers with mod A of 1 person. PT Short Term Goal 4 (Week 1): Patient will initiate pre-gait or gait training.  Skilled Therapeutic Interventions/Progress Updates:   Pt received sitting in WC and agreeable to PT. Gait training with RW to rehab gym with CGA-Supervision assist x 279f and 724fwith no AD and min assist to prevent LOB x 2 with RLE foot drag.   PT instructed pt in bed level therex:  Bridges, x 10  SLR,  X 12 Hip abduction level 2 tband  X 12 Reciprocal hip flexion level 2 tband x 10  Ankle DF with level 2 tband  X 20  Verbal and visual instruction for full ROM, decreased speed of eccentric movement, and prolonged sustained contraction at end range.   Dynamic gait training with RW to weave through 3 cones, step over 3, 1 inch canes, over unlevel mat surface and up/down 6 inch curb step. Min assist from PT for safety with intermittent R LR foot drag, and min-mod cues for AD management and safety.   PT instructed [place] in [place] strengthening BLE program:  LAQ, x 8 HS curls x 10,  Mini squats, x20 Sit<>stand, x 5  Tandem standing, 2 x 10 sec   Hip abduction x 10  BUE support on parallel bars throughout. Cues for full ROM, decreased compensations, improved posture, and decreased speed to improve strengthening aspect of exercises.   Patient returned to room and left sitting in recliner with call bell in reach and all needs met.        Therapy Documentation Precautions:   Precautions Precautions: Fall Restrictions Weight Bearing Restrictions: No RLE Weight Bearing: Non weight bearing LLE Weight Bearing: Non weight bearing    Vital Signs: Therapy Vitals Temp: 97.9 F (36.6 C) Temp Source: Oral Pulse Rate: 71 Resp: 18 BP: 122/75 Patient Position (if appropriate): Sitting Oxygen Therapy SpO2: 97 % O2 Device: Room Air Pain: Pain Assessment Pain Score: 0-No pain    Therapy/Group: Individual Therapy  AuLorie Phenix/18/2020, 3:17 PM

## 2019-05-11 NOTE — Progress Notes (Addendum)
Patient ID: Lawrence Olson, male   DOB: 1950/10/26, 68 y.o.   [name] [id number]   Advanced Heart Failure Team  Note   Primary Physician: Patient, No Pcp Per PCP-Cardiologist:  No primary care provider on file.  HPI:    Moved to CIR on 9/11  Feels good today. Able to do more with therapy. Denies SOB.      Objective:    Vital Signs:   Temp:  [97.9 F (36.6 C)-98.3 F (36.8 C)] 97.9 F (36.6 C) (09/18 1409) Pulse Rate:  [67-73] 71 (09/18 1409) Resp:  [18] 18 (09/18 1409) BP: (117-129)/(69-84) 122/75 (09/18 1409) SpO2:  [94 %-97 %] 97 % (09/18 1409) Last BM Date: 05/09/19  Weight change: Filed Weights   05/06/19 0349 05/07/19 0302 05/09/19 0424  Weight: 80.9 kg 81.1 kg 74.7 kg    Intake/Output:   Intake/Output Summary (Last 24 hours) at 05/11/2019 1452 Last data filed at 05/11/2019 1300 Gross per 24 hour  Intake 540 ml  Output 1670 ml  Net -1130 ml      Physical Exam   General:  Well appearing. No resp difficulty. Sitting in the chair.  HEENT: normal Neck: supple. no JVD. Carotids 2+ bilat; no bruits. No lymphadenopathy or thryomegaly appreciated. Cor: PMI nondisplaced. Regular rate & rhythm. No rubs, gallops or murmurs. Lungs: clear Abdomen: soft, nontender, nondistended. No hepatosplenomegaly. No bruits or masses. Good bowel sounds. Extremities: no cyanosis, clubbing, rash, edema Neuro: alert & orientedx3, cranial nerves grossly intact. moves all 4 extremities w/o difficulty. Affect pleasant  NOT On Telemetry   Labs   Basic Metabolic Panel: Recent Labs  Lab 05/07/19 0756 05/08/19 0523 05/09/19 0554 05/10/19 0704 05/11/19 0600  NA 139 136 136 136 134*  K 5.5* 5.1 4.9 5.1 4.4  CL 105 103 105 103 103  CO2 24 21* 20* 22 22  GLUCOSE 145* 164* 169* 113* 125*  BUN 96* 84* 80* 70* 66*  CREATININE 2.70* 2.48* 2.50* 2.41* 2.51*  CALCIUM 8.9 8.7* 8.8* 8.9 8.6*  MG  --   --   --   --  1.9    Liver Function Tests: Recent Labs  Lab 05/05/19 2357  AST 23   ALT 57*  ALKPHOS 80  BILITOT 1.0  PROT 6.6  ALBUMIN 2.4*   No results for input(s): LIPASE, AMYLASE in the last 168 hours. No results for input(s): AMMONIA in the last 168 hours.  CBC: Recent Labs  Lab 05/05/19 2357 05/07/19 0756 05/10/19 0704  WBC 17.0* 19.4* 15.8*  NEUTROABS 14.3*  --  10.5*  HGB 9.1* 9.9* 10.3*  HCT 29.0* 32.0* 31.3*  MCV 96.3 97.9 95.4  PLT 865* 850* 658*    Cardiac Enzymes: Recent Labs  Lab 05/05/19 2357  CKTOTAL 21*    BNP: BNP (last 3 results) No results for input(s): BNP in the last 8760 hours.  ProBNP (last 3 results) No results for input(s): PROBNP in the last 8760 hours.   CBG: Recent Labs  Lab 05/08/19 2058 05/09/19 0657 05/09/19 1119 05/10/19 0707 05/10/19 2008  GLUCAP 207* 168* 234* 102* 166*    Coagulation Studies: No results for input(s): LABPROT, INR in the last 72 hours.   Imaging   No results found.   Medications:     Current Medications: . aspirin  81 mg Oral Daily  . atorvastatin  80 mg Oral q1800  . B-complex with vitamin C  1 tablet Oral Daily  . carvedilol  3.125 mg Oral BID WC  . chlorhexidine  15 mL Mouth Rinse BID  . Chlorhexidine Gluconate Cloth  6 each Topical Daily  . famotidine  20 mg Oral Daily  . feeding supplement (PRO-STAT SUGAR FREE 64)  30 mL Oral BID  . heparin  5,000 Units Subcutaneous Q8H  . insulin detemir  10 Units Subcutaneous Q12H  . lidocaine  1 patch Transdermal Q24H  . mouth rinse  15 mL Mouth Rinse q12n4p  . megestrol  400 mg Oral Daily  . polyethylene glycol  17 g Oral Daily  . Ensure Max Protein  11 oz Oral Daily  . QUEtiapine  75 mg Oral QHS  . sodium chloride flush  10-40 mL Intracatheter Q12H  . tamsulosin  0.4 mg Oral QPC supper    Infusions:   Assessment/Plan   1. CAD: Anterolateral STEMI, cath 8/24 showed occluded proximal LAD, unable to open.  Once we see how he recovers neurologically, will need to decide on feasibility of re-attempt PCI versus CABG.  Medical therapy for the forseeable future. - No chest pain.  - Continue medical therapy with ASA + statin and carvedilol   2. Acute systolic HF - Echo with EF 25%, normal RV.  - Stable from HF perspective.  - Volume status stable.  - No ACE/ARB/ARNI with AKI - On low dose carvedilol  - Volume status stable - Consider hyrdal nitrates . SBP 110-120. Will consider if SBP is > 130.     3. AKI:  -Rena functions continues to trend down.  - Plans to follow up as an outpt. No plan for HD.  - Creatinine 2.5 BUN down to 66. Stable.   4. Cardiac arrest/VF:  - Will need LifeVest once strong enough to push alert deactivation button. Suspect this will be int he next day or two - Keep K > 4.0/Mg 2.0  5. Severe Deconditioning.  Getting stronger with PT/OT/Speech   Amy Clegg NP-C  05/11/2019 2:52 PM  Patient seen and examined with Darrick Grinder, NP. We discussed all aspects of the encounter. I agree with the assessment and plan as stated above.   Much improved today. Sitting in chair. Stronger. No CP or SOB.  Volume status ok.  I have contacted LifeVest team to see if we can place device today or tomorrow.   We will see again Monday. Call over weekend with questions.   Glori Bickers, MD  4:15 PM

## 2019-05-11 NOTE — Progress Notes (Addendum)
[facility] PHYSICAL MEDICINE & REHABILITATION PROGRESS NOTE   Subjective/Complaints: Had a good night. Feels stronger. Appetite good  ROS: Patient denies fever, rash, sore throat, blurred vision, nausea, vomiting, diarrhea, cough,  chest pain, joint or back pain, headache, or mood change.   Objective:    No results found. Recent Labs    05/10/19 0704  WBC 15.8*  HGB 10.3*  HCT 31.3*  PLT 658*   Recent Labs    05/10/19 0704 05/11/19 0600  NA 136 134*  K 5.1 4.4  CL 103 103  CO2 22 22  GLUCOSE 113* 125*  BUN 70* 66*  CREATININE 2.41* 2.51*  CALCIUM 8.9 8.6*    Intake/Output Summary (Last 24 hours) at 05/11/2019 0846 Last data filed at 05/11/2019 0655 Gross per 24 hour  Intake 240 ml  Output 1670 ml  Net -1430 ml     Physical Exam: Vital Signs Blood pressure 117/69, pulse 73, temperature 98.3 F (36.8 C), resp. rate 18, weight 74.7 kg, SpO2 95 %. Constitutional: No distress . Vital signs reviewed. HEENT: EOMI, oral membranes moist Neck: supple Cardiovascular: RRR without murmur. No JVD    Respiratory: CTA Bilaterally without wheezes or rales. Normal effort    GI: BS +, non-tender, non-distended  Skin: Warm and dry.  Intact. Bruising in RLQ Psych: Normal mood.  Normal behavior. Less anxious Musc: no swelling. Normal ROM Neurologic: Alert and oriented x3. Normal language.  Motor: Grossly 4 to 4+/5 throughout    Assessment/Plan: 1. 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  Physiatrist is providing close team supervision and 24 hour management of active medical problems listed below.  Physiatrist and rehab team continue to assess barriers to discharge/monitor patient progress toward functional and medical goals  Care Tool:  Bathing  Bathing activity did not occur: Safety/medical concerns Body parts bathed by patient: Right arm,  Left arm, Chest, Abdomen, Face   Body parts bathed by helper: Front perineal area, Buttocks, Right upper leg, Left upper leg, Right lower leg, Left lower leg     Bathing assist Assist Level: Supervision/Verbal cueing     Upper Body Dressing/Undressing Upper body dressing   What is the patient wearing?: Pull over shirt    Upper body assist Assist Level: Minimal Assistance - Patient > 75%    Lower Body Dressing/Undressing Lower body dressing      What is the patient wearing?: Incontinence brief     Lower body assist Assist for lower body dressing: Minimal Assistance - Patient > 75%     Toileting Toileting    Toileting assist Assist for toileting: Moderate Assistance - Patient 50 - 74%     Transfers Chair/bed transfer  Transfers assist     Chair/bed transfer assist level: Minimal Assistance - Patient > 75%     Locomotion Ambulation   Ambulation assist      Assist level: Minimal Assistance - Patient > 75% Assistive device: Walker-rolling Max distance: 150   Walk 10 feet activity   Assist  Walk 10 feet activity did not occur: Safety/medical concerns(decreased strength/activity tolerance)  Assist level: Contact Guard/Touching assist Assistive device: Walker-rolling   Walk 50 feet activity   Assist Walk 50 feet with 2 turns activity did not occur: Safety/medical concerns(decreased strength/activity tolerance)  Assist level: Minimal Assistance - Patient > 75% Assistive device: Walker-rolling    Walk 150 feet activity   Assist Walk 150 feet activity  did not occur: Safety/medical concerns(decreased strength/activity tolerance)  Assist level: Minimal Assistance - Patient > 75% Assistive device: Walker-rolling    Walk 10 feet on uneven surface  activity   Assist Walk 10 feet on uneven surfaces activity did not occur: Safety/medical concerns(decreased strength/activity tolerance)         Wheelchair     Assist   Type of Wheelchair:  Manual Wheelchair activity did not occur: Safety/medical concerns(decreased strength/activity tolerance)  Wheelchair assist level: Supervision/Verbal cueing Max wheelchair distance: 100    Wheelchair 50 feet with 2 turns activity    Assist    Wheelchair 50 feet with 2 turns activity did not occur: Safety/medical concerns(decreased strength/activity tolerance)   Assist Level: Supervision/Verbal cueing   Wheelchair 150 feet activity     Assist  Wheelchair 150 feet activity did not occur: Safety/medical concerns(decreased strength/activity tolerance)       Blood pressure 117/69, pulse 73, temperature 98.3 F (36.8 C), resp. rate 18, weight 74.7 kg, SpO2 95 %.  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 Antithrombotics: -DVT/anticoagulation:Pharmaceutical:Heparin -antiplatelet therapy: ASA 3. Pain Management:  Lidoderm patch added for right chest wall pain, which appears to be musculoskeletal given history and reproducibility 4. Mood:LCSW to follow for evaluation and support. Does not have full awareness of deficits. -antipsychotic agents: qhs seroquel  -improving sleep patterns  -Increased pm seroquel to 75mg   At 2100 with good results 5. Neuropsych: This patientis not fullycapable of making decisions on hisown behalf. 6. Skin/Wound Care:Routine pressure relief measures 7. Fluids/Electrolytes/Nutrition:strict I/O. Encourage PO  -RD following for intake   -trial of megace      eating 100% now   continue megace  daily 8.CAD with STEMI/VTarrest: Treated medically for now.  -LifeVest plan per Dr Sung Amabile. ?today -continueCoreg twice daily and Lipitor  -has tolerated activities with therapy without issues thus far 9.Pneumonitis question due to amiodarone: off of prednisone 10.T2DM with  hyperglycemia:   -some improvement over last 24 hours  -po intake has improved which has elevated cbg's  -he's on CM diet  -Increased levemir to 10u bid on 9/17  -was not on any meds for diabetes PTA   -  hgbA1C pending 11.Acute renal failure due to ischemic ATN:  -nephrology following, appreciate recs -daily labs pending today  Creatinine 2.41 9/17 12. Urine retention: -continue  Flomax   13. Acute blood loss anemia:     Hemoglobin 9.9 on 9/14 14.Combined heart failure/ischemic cardiomyopathy: Monitor for signs of overload and check weights daily.    Filed Weights   05/06/19 0349 05/07/19 0302 05/09/19 0424  Weight: 80.9 kg 81.1 kg 74.7 kg    -On Coreg twice daily.   -No ACE/arms or BB due to AKI and shock  ?  Reliability on 9/16. Weights have not been recorded last two days!---will re-order today  -no edema, lungs clear 15.Leukocytosis/Thrombocytosis: Afebrile off antibiotics. reactive/secondary to steroids.   No active signs of infection.  Afebrile  WBC down to 15.8  9/17  Platelets 658   9/17  16.  Constipation:     -scheduled miralax daily--had bm 9/16  -needs bm today LOS: 7 days A FACE TO [place] 05/11/2019, 8:46 AM

## 2019-05-11 NOTE — Progress Notes (Signed)
Speech Language Pathology Weekly Progress and Session Note  Patient Details  Name: Contrell Ballentine [name] [id number] Date of Birth: 1950/10/05  Beginning of progress report period: May 04, 2019 End of progress report period: May 11, 2019  Today's Date: 05/11/2019 SLP Individual Time: 1100-1154 SLP Individual Time Calculation (min): 54 min  Short Term Goals: Week 1: SLP Short Term Goal 1 (Week 1): Pt will consume dys 2 textures and thin liquids with supervision cues for use of swallowing precautions and minimal overt s/s of aspiration. SLP Short Term Goal 1 - Progress (Week 1): Met SLP Short Term Goal 2 (Week 1): Pt will consume therapeutic trials of dys 3 textures with supervision cues for use of swallowing precautions and minimal overt s/s of afspiration over 3 consecutive sessions prior to diet advancement. SLP Short Term Goal 2 - Progress (Week 1): Met SLP Short Term Goal 3 (Week 1): Pt will utilize external aids to recall basic, daily information with mod assist verbal cues. SLP Short Term Goal 3 - Progress (Week 1): Met SLP Short Term Goal 4 (Week 1): Pt will selectively attend to basic tasks for 15 minutes in the presence of mild environmental distractions with min verbal cues for redirection. SLP Short Term Goal 4 - Progress (Week 1): Met SLP Short Term Goal 5 (Week 1): Pt will complete basic tasks with mod assist verbal cues for functional problem solving. SLP Short Term Goal 5 - Progress (Week 1): Met    New Short Term Goals: Week 2: SLP Short Term Goal 1 (Week 2): STG = LTG due to ELOS  Weekly Progress Updates: Pt has made excellent functional gains and met 5 out of 5 short term goals. Pt is currently Supervision for use of swallow strategies and Min assist for semi-complex problem solving. Pt is consuming Dysphagia 3 solid diet with thin liquids. Pt has demonstrated improved basic and semi-complex problem solving, use of swallow precautions and compensatory memory  strategies, sustained attention. Pt's long term problem solving was upgraded to include semi-complex target due to pt's quick progression with basic tasks. Pt and family education is ongoing. Pt would continue to benefit from skilled ST while inpatient in order to maximize functional independence and reduce burden of care prior to discharge.  Anticipate that pt will need 24/7 supervision and assistance with complex cognitive tasks upon discharge.      Intensity: Minumum of 1-2 x/day, 30 to 90 minutes Frequency: 3 to 5 out of 7 days Duration/Length of Stay:   Treatment/Interventions: Cognitive remediation/compensation;Internal/external aids;Cueing hierarchy;Dysphagia/aspiration precaution training;Environmental controls;Functional tasks;Patient/family education   Daily Session  Skilled Therapeutic Interventions: Pt was seen for skilled ST targeting cognition and dysphagia. SLP facilitated session with review of previously targets complex medication management task. Pt initially only recalled 1/7 medications, however with Min A for use of external aid, pt able to identify names/functions/dosages of all medications. Pt's demonstrated significant improvements in his ability to interpret the list in order to organize medication into a BID pill box - he completed task with 100% accuracy provided Supervision A verbal cues for organization. In functional conversation regarding discharge, pt and wife expressed excitement about returning home and wife feels comfortable she will be able to provide level of support (both cognitive and physical). SLP reviewed upgraded problem solving goal with pt and wife - both excited and in agreement semi-complex/complex tasks are more appropriate target based on current level of function. SLP also facilitated session with skilled observation of pt consuming dysphagia 3 solid with thin  liquid. No overt s/s aspiration observed. Pt demonstrated efficient mastication and oral  clearance with use of extra swallow and liquid wash, Mod I. Pt was left in wheelchair with seatbelt alarm engaged, all needs within reach and wife still present. Continue per current plan of care.     Pain Pain Assessment Pain Score: 0-No pain  Therapy/Group: Individual Therapy   E  05/11/2019, 12:06 PM         

## 2019-05-11 NOTE — Plan of Care (Signed)
  Problem: RH Problem Solving Goal: LTG Patient will demonstrate problem solving for (SLP) Description: LTG:  Patient will demonstrate problem solving for basic/complex daily situations with cues  (SLP) Flowsheets (Taken 05/11/2019 0714) LTG: Patient will demonstrate problem solving for (SLP): (semi-complex) Other (comment) LTG Patient will demonstrate problem solving for: Supervision  Pt's problem solving goal updated to semi-complex 05/11/19 due to progress

## 2019-05-12 ENCOUNTER — Inpatient Hospital Stay (HOSPITAL_COMMUNITY): Payer: PPO | Admitting: Physical Therapy

## 2019-05-12 ENCOUNTER — Inpatient Hospital Stay (HOSPITAL_COMMUNITY): Payer: PPO

## 2019-05-12 DIAGNOSIS — I2510 Atherosclerotic heart disease of native coronary artery without angina pectoris: Secondary | ICD-10-CM

## 2019-05-12 DIAGNOSIS — E871 Hypo-osmolality and hyponatremia: Secondary | ICD-10-CM

## 2019-05-12 DIAGNOSIS — R1310 Dysphagia, unspecified: Secondary | ICD-10-CM

## 2019-05-12 LAB — HEMOGLOBIN A1C
Hgb A1c MFr Bld: 6.6 % — ABNORMAL HIGH (ref 4.8–5.6)
Mean Plasma Glucose: 143 mg/dL

## 2019-05-12 LAB — BASIC METABOLIC PANEL
Anion gap: 13 (ref 5–15)
BUN: 67 mg/dL — ABNORMAL HIGH (ref 8–23)
CO2: 20 mmol/L — ABNORMAL LOW (ref 22–32)
Calcium: 8.7 mg/dL — ABNORMAL LOW (ref 8.9–10.3)
Chloride: 99 mmol/L (ref 98–111)
Creatinine, Ser: 2.39 mg/dL — ABNORMAL HIGH (ref 0.61–1.24)
GFR calc Af Amer: 31 mL/min — ABNORMAL LOW (ref >60)
GFR calc non Af Amer: 27 mL/min — ABNORMAL LOW (ref >60)
Glucose, Bld: 143 mg/dL — ABNORMAL HIGH (ref 70–99)
Potassium: 4.6 mmol/L (ref 3.5–5.1)
Sodium: 132 mmol/L — ABNORMAL LOW (ref 135–145)

## 2019-05-12 NOTE — Progress Notes (Signed)
[facility] PHYSICAL MEDICINE & REHABILITATION PROGRESS NOTE   Subjective/Complaints: Patient seen sitting up this morning and later working with therapies.  Wife at bedside.  Patient states he slept well overnight.  He was seen by heart failure team yesterday, notes reviewed, plan for LifeVest.  ROS: Denies CP, SOB, N/V/D  Objective:    No results found. Recent Labs    05/10/19 0704  WBC 15.8*  HGB 10.3*  HCT 31.3*  PLT 658*   Recent Labs    05/11/19 0600 05/12/19 0526  NA 134* 132*  K 4.4 4.6  CL 103 99  CO2 22 20*  GLUCOSE 125* 143*  BUN 66* 67*  CREATININE 2.51* 2.39*  CALCIUM 8.6* 8.7*    Intake/Output Summary (Last 24 hours) at 05/12/2019 1529 Last data filed at 05/12/2019 1300 Gross per 24 hour  Intake 697 ml  Output 500 ml  Net 197 ml     Physical Exam: Vital Signs Blood pressure 135/71, pulse 69, temperature (!) 97.4 F (36.3 C), temperature source Oral, resp. rate 18, weight 75.8 kg, SpO2 100 %. Constitutional: No distress . Vital signs reviewed. HENT: Normocephalic.  Atraumatic. Eyes: EOMI. No discharge. Cardiovascular: No JVD. Respiratory: Normal effort.  No stridor. GI: Non-distended. Skin: Warm and dry.  Intact. Psych: Normal mood.  Normal behavior. Musc: No edema in extremities.  No tenderness in extremities. Neurologic: Alert Motor: Grossly 4/5 throughout    Assessment/Plan: 1. 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  Physiatrist is providing close team supervision and 24 hour management of active medical problems listed below.  Physiatrist and rehab team continue to assess barriers to discharge/monitor patient progress toward functional and medical goals  Care Tool:  Bathing  Bathing activity did not occur: Safety/medical concerns Body parts bathed by patient: Right arm, Left arm, Chest, Abdomen, Face    Body parts bathed by helper: Front perineal area, Buttocks, Right upper leg, Left upper leg, Right lower leg, Left lower leg     Bathing assist Assist Level: Supervision/Verbal cueing     Upper Body Dressing/Undressing Upper body dressing   What is the patient wearing?: Pull over shirt    Upper body assist Assist Level: Minimal Assistance - Patient > 75%    Lower Body Dressing/Undressing Lower body dressing      What is the patient wearing?: Incontinence brief     Lower body assist Assist for lower body dressing: Minimal Assistance - Patient > 75%     Toileting Toileting    Toileting assist Assist for toileting: Moderate Assistance - Patient 50 - 74%     Transfers Chair/bed transfer  Transfers assist     Chair/bed transfer assist level: Contact Guard/Touching assist     Locomotion Ambulation   Ambulation assist      Assist level: Contact Guard/Touching assist Assistive device: Walker-rolling Max distance: 150'   Walk 10 feet activity   Assist  Walk 10 feet activity did not occur: Safety/medical concerns(decreased strength/activity tolerance)  Assist level: Contact Guard/Touching assist Assistive device: Walker-rolling   Walk 50 feet activity   Assist Walk 50 feet with 2 turns activity did not occur: Safety/medical concerns(decreased strength/activity tolerance)  Assist level: Contact Guard/Touching assist Assistive device: Walker-rolling    Walk 150 feet activity   Assist Walk 150 feet activity did not occur: Safety/medical concerns(decreased strength/activity tolerance)  Assist level: Contact Guard/Touching assist Assistive device: Walker-rolling    Walk  10 feet on uneven surface  activity   Assist Walk 10 feet on uneven surfaces activity did not occur: Safety/medical concerns(decreased strength/activity tolerance)   Assist level: Minimal Assistance - Patient > 75% Assistive device: Aeronautical engineer    Type of Wheelchair: Manual Wheelchair activity did not occur: Safety/medical concerns(decreased strength/activity tolerance)  Wheelchair assist level: Supervision/Verbal cueing Max wheelchair distance: 100    Wheelchair 50 feet with 2 turns activity    Assist    Wheelchair 50 feet with 2 turns activity did not occur: Safety/medical concerns(decreased strength/activity tolerance)   Assist Level: Supervision/Verbal cueing   Wheelchair 150 feet activity     Assist  Wheelchair 150 feet activity did not occur: Safety/medical concerns(decreased strength/activity tolerance)       Blood pressure 135/71, pulse 69, temperature (!) 97.4 F (36.3 C), temperature source Oral, resp. rate 18, weight 75.8 kg, SpO2 100 %.  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 Antithrombotics: -DVT/anticoagulation:Pharmaceutical:Heparin -antiplatelet therapy: ASA 3. Pain Management:  Lidoderm patch added for right chest wall pain, which appears to be musculoskeletal given history and reproducibility-improved 4. Mood:LCSW to follow for evaluation and support. Does not have full awareness of deficits. -antipsychotic agents: qhs seroquel  -improving sleep patterns  -Increased pm seroquel to 75mg  at 2100 with good results 5. Neuropsych: This patientis not fullycapable of making decisions on hisown behalf. 6. Skin/Wound Care:Routine pressure relief measures 7. Fluids/Electrolytes/Nutrition:strict I/Os. Encourage PO  -RD following for intake   -Continue Megace 8.CAD with STEMI/VTarrest: Treated medically for now.  -LifeVest plan per Dr Sung Amabile?  Today -continueCoreg twice daily and Lipitor  -has tolerated activities with therapy without issues thus far 9.Pneumonitis question due to amiodarone: off of prednisone 10.T2DM with hyperglycemia:   -Continue CM  diet  -Increased levemir to 10u bid on 9/17  Hemoglobin A1c 6.6 on 9/18  Elevated on 9/19, will consider further increase in medication tomorrow 11.Acute renal failure due to ischemic ATN:  -nephrology following, appreciate recs Creatinine 2.39 on 9/19   Continue to monitor 12. Urine retention: -continue  Flomax   13. Acute blood loss anemia:     Hemoglobin 10.3 on 9/17 14.Combined heart failure/ischemic cardiomyopathy: Monitor for signs of overload and check weights daily.    Filed Weights   05/09/19 0424 05/11/19 1700 05/12/19 0339  Weight: 74.7 kg 75.5 kg 75.8 kg    -On Coreg twice daily.   -No ACE/arms or BB due to AKI and shock  Stable on 9/19 15.Leukocytosis/Thrombocytosis: Afebrile off antibiotics. reactive/secondary to steroids.   No active signs of infection.  Afebrile  WBC down to 15.8 on 9/17  Platelets 658 on 9/17  Labs ordered for Monday 16.  Constipation:     Improving 17.  Hyponatremia  Sodium 132 on 9/19  Continue to monitor 18.  Dysphagia  D3 thins, advance as tolerated  LOS: 8 days A FACE TO FACE EVALUATION WAS PERFORMED  Ankit Lorie Phenix 05/12/2019, 3:29 PM

## 2019-05-12 NOTE — Progress Notes (Signed)
Speech Language Pathology Daily Session Note  Patient Details  Name: Lawrence Olson [name] [id number] Date of Birth: Jan 18, 1951  Today's Date: 05/12/2019 SLP Individual Time: 0902-0943 SLP Individual Time Calculation (min): 41 min  Short Term Goals: Week 2: SLP Short Term Goal 1 (Week 2): STG = LTG due to ELOS  Skilled Therapeutic Interventions: Skilled ST services focused on cognitive skills. Pt's wife was present for treatment and occasional provided translation. SLP facilitated complex problem solving and error awareness skill in familiar check book balancing task, pt required supervision A verbal cues for complex problem solving and min A verbal cues for error awareness, checking entry on calculator and appropriate decimal placement. Pt was left in room with wife, call bell within reach and chair alarm set. ST recommends to continue skilled ST services.      Pain Pain Assessment Pain Score: 0-No pain  Therapy/Group: Individual Therapy  Kameko Hukill  [facility] 05/12/2019, 12:19 PM

## 2019-05-12 NOTE — Progress Notes (Signed)
Physical Therapy Weekly Progress Note  Patient Details  Name: Lawrence Olson [name] [id number] Date of Birth: 14-Aug-1951  Beginning of progress report period: May 05, 2019 End of progress report period: May 12, 2019  Today's Date: 05/12/2019  Patient has met 4 of 4 short term goals.  Pt is at Supervision level for bed mobility, CGA for transfers with RW, and is able to ambulate up to 220 ft with RW and CGA. Pt demonstrates improved balance and tolerance for therapy and is making good progress towards goals.  Patient continues to demonstrate the following deficits muscle weakness, decreased cardiorespiratoy endurance and decreased standing balance, decreased postural control and decreased balance strategies and therefore will continue to benefit from skilled PT intervention to increase functional independence with mobility.  Patient progressing toward long term goals..  Continue plan of care.  PT Short Term Goals Week 1:  PT Short Term Goal 1 (Week 1): Patient will perform rolling and supine to sit with min A. PT Short Term Goal 1 - Progress (Week 1): Met PT Short Term Goal 2 (Week 1): Patient will perform dynamic sitting balance with CGA. PT Short Term Goal 2 - Progress (Week 1): Met PT Short Term Goal 3 (Week 1): Patient will perform basic transfers with mod A of 1 person. PT Short Term Goal 3 - Progress (Week 1): Met PT Short Term Goal 4 (Week 1): Patient will initiate pre-gait or gait training. PT Short Term Goal 4 - Progress (Week 1): Met Week 2:  PT Short Term Goal 1 (Week 2): =LTG due to ELOS  Skilled Therapeutic Interventions/Progress Updates:  Ambulation/gait training;Discharge planning;Functional mobility training;Psychosocial support;Therapeutic Activities;Visual/perceptual remediation/compensation;Balance/vestibular training;Disease management/prevention;Neuromuscular re-education;Skin care/wound management;Therapeutic Exercise;Wheelchair  propulsion/positioning;Cognitive remediation/compensation;DME/adaptive equipment instruction;Pain management;Splinting/orthotics;UE/LE Strength taining/ROM;Community reintegration;Functional electrical stimulation;Patient/family education;Stair training;UE/LE Coordination activities   Therapy Documentation Precautions:  Precautions Precautions: Fall Restrictions Weight Bearing Restrictions: No RLE Weight Bearing: Non weight bearing LLE Weight Bearing: Non weight bearing   Therapy/Group: Individual Therapy   Excell Seltzer, PT, DPT  05/12/2019, 2:34 PM

## 2019-05-12 NOTE — Progress Notes (Signed)
Patient refused suppository, last BM 9/16. Education provided.

## 2019-05-12 NOTE — Progress Notes (Signed)
Physical Therapy Session Note  Patient Details  Name: Lawrence Olson [name] [id number] Date of Birth: 23-Apr-1951  Today's Date: 05/12/2019 PT Individual Time: 1345-1430 PT Individual Time Calculation (min): 45 min   Short Term Goals: Week 1:  PT Short Term Goal 1 (Week 1): Patient will perform rolling and supine to sit with min A. PT Short Term Goal 2 (Week 1): Patient will perform dynamic sitting balance with CGA. PT Short Term Goal 3 (Week 1): Patient will perform basic transfers with mod A of 1 person. PT Short Term Goal 4 (Week 1): Patient will initiate pre-gait or gait training.  Skilled Therapeutic Interventions/Progress Updates:    Pt received seated in recliner in room, agreeable to PT session. No complaints of pain. Pt transfers sit to stand with CGA to RW throughout session. Ambulation 2 x 150 ft with RW and CGA. Session focus on dynamic standing balance and safe RW management. Ambulation with RW through obstacle course navigating cones and stepping over obstacles with min A, v/c for keeping RW close to body. Agility ladder forwards step-through and lateral step-to with B HHA for balance. Four-square stepping with B HHA for balance, one instance of catching BLE on obstacles. Sidesteps L/R 2 x 15 ft with RW and CGA. Standing alt L/R 4" step ups with one UE support and CGA for balance, 2 x 10 reps. Pt takes frequent rest breaks between activities due to fatigue. Pt requests to return to bed at end of session. Sit to supine Supervision. Pt left semi-reclined in bed with needs in reach, bed alarm in place, wife present.  Therapy Documentation Precautions:  Precautions Precautions: Fall Restrictions Weight Bearing Restrictions: No RLE Weight Bearing: Non weight bearing LLE Weight Bearing: Non weight bearing    Therapy/Group: Individual Therapy   Excell Seltzer, PT, DPT  05/12/2019, 2:33 PM

## 2019-05-13 ENCOUNTER — Inpatient Hospital Stay (HOSPITAL_COMMUNITY): Payer: PPO

## 2019-05-13 DIAGNOSIS — R7309 Other abnormal glucose: Secondary | ICD-10-CM

## 2019-05-13 LAB — BASIC METABOLIC PANEL
Anion gap: 11 (ref 5–15)
BUN: 58 mg/dL — ABNORMAL HIGH (ref 8–23)
CO2: 23 mmol/L (ref 22–32)
Calcium: 8.7 mg/dL — ABNORMAL LOW (ref 8.9–10.3)
Chloride: 100 mmol/L (ref 98–111)
Creatinine, Ser: 2.41 mg/dL — ABNORMAL HIGH (ref 0.61–1.24)
GFR calc Af Amer: 31 mL/min — ABNORMAL LOW (ref >60)
GFR calc non Af Amer: 27 mL/min — ABNORMAL LOW (ref >60)
Glucose, Bld: 179 mg/dL — ABNORMAL HIGH (ref 70–99)
Potassium: 4.5 mmol/L (ref 3.5–5.1)
Sodium: 134 mmol/L — ABNORMAL LOW (ref 135–145)

## 2019-05-13 LAB — COOXEMETRY PANEL
Carboxyhemoglobin: 1.2 % (ref 0.5–1.5)
Methemoglobin: 1.2 % (ref 0.0–1.5)
O2 Saturation: 29.2 %
Total hemoglobin: 9.7 g/dL — ABNORMAL LOW (ref 12.0–16.0)

## 2019-05-13 LAB — GLUCOSE, CAPILLARY
Glucose-Capillary: 234 mg/dL — ABNORMAL HIGH (ref 70–99)
Glucose-Capillary: 153 mg/dL — ABNORMAL HIGH (ref 70–99)

## 2019-05-13 NOTE — Progress Notes (Signed)
Order transcribed for cooximetry panel by rn. As per radiology it was from 5 am this morning and needs to be reordered again. Lab to draw blood again as blood drawn earlier this evening was not processed at radiology in a timely manner.

## 2019-05-13 NOTE — Progress Notes (Signed)
RN called Lab, phlebotomy and respiratory to follow up on the order for cooximetry panel. Per respiratory, he will call main lab. Report given to day shift rn to follow up.

## 2019-05-13 NOTE — Progress Notes (Signed)
Occupational Therapy Weekly Progress Note  Patient Details  Name: Lawrence Olson [name] [id number] Date of Birth: [birth year]  Beginning of progress report period: May 05, 2019 End of progress report period: May 13, 2019  Today's Date: 05/13/2019 OT Individual Time: 1110-1204 OT Individual Time Calculation (min): 54 min    Patient has met 4 of 4 short term goals.  Pt has made great progress toward his OT goals this reporting period. Pt can complete dressing tasks with (S) overall and is close to goal level. Pt's endurance has improved significantly and he is able to complete 300 ft of functional mobility at CGA level with RW. Pt and his wife are very motivated to return to [facility] and his wife has been present all sessions for education. Family education will continue with more opportunities for hands on training.   Patient continues to demonstrate the following deficits: muscle weakness, decreased cardiorespiratoy endurance, decreased memory and delayed processing and decreased standing balance and therefore will continue to benefit from skilled OT intervention to enhance overall performance with BADL.  Patient progressing toward long term goals..  Continue plan of care.  OT Short Term Goals Week 1:  OT Short Term Goal 1 (Week 1): Pt will complete bathing with mod assist at sit > stand level with mod cues for initiation/sequencing OT Short Term Goal 1 - Progress (Week 1): Met OT Short Term Goal 2 (Week 1): Pt will complete UB dressing with mod assist with mod cues for initiation/sequencing OT Short Term Goal 2 - Progress (Week 1): Met OT Short Term Goal 3 (Week 1): Pt will complete LB dressing with max assist OT Short Term Goal 4 (Week 1): Pt will complete 1 grooming task in standing with mod assist for standing balance OT Short Term Goal 4 - Progress (Week 1): Met Week 2:  OT Short Term Goal 1 (Week 2): STG= LTG d/t ELOS  Skilled Therapeutic Interventions/Progress Updates:    Pt  received supine, no c/o pain. Pt completed bed mobility with HOB raised with (S). Wife assisted with donning shoes EOB. Pt completed 300 ft of functional mobility with RW with CGA- (S) assist. Pt used NuStep to challenge cardiovascular endurance- 2 sets of 5 min intervals at level 6 resistance. Pt reported 13 on the BORG scale following each trial and requiring rest break. Discussed with pt and wife return to normalcy and activity at home while pt on Nustep. Provided examples of safe exercise and ways to monitor safe exertion at home, including use of the BORG. Pt returned to the therapy gym and completed dynamic standing balance activity, with reciprocal stepping with unilateral stance. Pt then completed step up and down on 4 in step 2x 10 repetitions. Pt also completed single leg stance dynamic balance activity with min A provided. Pt returned to his room and was left supine with all needs met. Very clearly instructed pt and his wife that she was now signed off to take pt to the bathroom, NOT to walk around the room. Safety plan updated.   Therapy Documentation Precautions:  Precautions Precautions: Fall Restrictions Weight Bearing Restrictions: No    Therapy/Group: Individual Therapy  Curtis Sites 05/13/2019, 12:31 PM

## 2019-05-13 NOTE — Progress Notes (Signed)
Nurse called down to lab, phlebotomy, to follow up with a lab order placed for a cooximetry  panel.  Nurse kept getting transferred/redirected to another number. Staff notified them that lab was ordered. MD Posey Pronto notified of issue.

## 2019-05-13 NOTE — Progress Notes (Signed)
[facility] PHYSICAL MEDICINE & REHABILITATION PROGRESS NOTE   Subjective/Complaints: Patient seen sitting up in his chair this morning.  Wife at bedside.  Both note that patient is getting stronger.  Patient was not seen by heart failure team yesterday, continues to await LifeVest.  ROS: Denies CP, SOB, N/V/D  Objective:    No results found. No results for input(s): WBC, HGB, HCT, PLT in the last 72 hours. Recent Labs    05/12/19 0526 05/13/19 0551  NA 132* 134*  K 4.6 4.5  CL 99 100  CO2 20* 23  GLUCOSE 143* 179*  BUN 67* 58*  CREATININE 2.39* 2.41*  CALCIUM 8.7* 8.7*    Intake/Output Summary (Last 24 hours) at 05/13/2019 1355 Last data filed at 05/13/2019 0900 Gross per 24 hour  Intake 250 ml  Output 1850 ml  Net -1600 ml     Physical Exam: Vital Signs Blood pressure 112/67, pulse 71, temperature 98.2 F (36.8 C), temperature source Oral, resp. rate 18, weight 78.1 kg, SpO2 94 %. Constitutional: No distress . Vital signs reviewed. HENT: Normocephalic.  Atraumatic. Eyes: EOMI. No discharge. Cardiovascular: No JVD. Respiratory: Normal effort.  No stridor. GI: Non-distended. Skin: Warm and dry.  Intact. Psych: Normal mood.  Normal behavior. Musc: No edema in extremities.  No tenderness in extremities. Neurologic: Alert Motor: Grossly 4-4+/5 throughout    Assessment/Plan: 1. 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  Physiatrist is providing close team supervision and 24 hour management of active medical problems listed below.  Physiatrist and rehab team continue to assess barriers to discharge/monitor patient progress toward functional and medical goals  Care Tool:  Bathing  Bathing activity did not occur: Safety/medical concerns Body parts bathed by patient: Right arm, Left arm, Chest, Abdomen, Face   Body parts bathed by  helper: Front perineal area, Buttocks, Right upper leg, Left upper leg, Right lower leg, Left lower leg     Bathing assist Assist Level: Supervision/Verbal cueing     Upper Body Dressing/Undressing Upper body dressing   What is the patient wearing?: Pull over shirt    Upper body assist Assist Level: Minimal Assistance - Patient > 75%    Lower Body Dressing/Undressing Lower body dressing      What is the patient wearing?: Incontinence brief     Lower body assist Assist for lower body dressing: Minimal Assistance - Patient > 75%     Toileting Toileting    Toileting assist Assist for toileting: Moderate Assistance - Patient 50 - 74%     Transfers Chair/bed transfer  Transfers assist     Chair/bed transfer assist level: Contact Guard/Touching assist     Locomotion Ambulation   Ambulation assist      Assist level: Contact Guard/Touching assist Assistive device: Walker-rolling Max distance: 150'   Walk 10 feet activity   Assist  Walk 10 feet activity did not occur: Safety/medical concerns(decreased strength/activity tolerance)  Assist level: Contact Guard/Touching assist Assistive device: Walker-rolling   Walk 50 feet activity   Assist Walk 50 feet with 2 turns activity did not occur: Safety/medical concerns(decreased strength/activity tolerance)  Assist level: Contact Guard/Touching assist Assistive device: Walker-rolling    Walk 150 feet activity   Assist Walk 150 feet activity did not occur: Safety/medical concerns(decreased strength/activity tolerance)  Assist level: Contact Guard/Touching assist Assistive device: Walker-rolling    Walk 10 feet on uneven surface  activity   Assist  Walk 10 feet on uneven surfaces activity did not occur: Safety/medical concerns(decreased strength/activity tolerance)   Assist level: Minimal Assistance - Patient > 75% Assistive device: Aeronautical engineer   Type of Wheelchair:  Manual Wheelchair activity did not occur: Safety/medical concerns(decreased strength/activity tolerance)  Wheelchair assist level: Supervision/Verbal cueing Max wheelchair distance: 100    Wheelchair 50 feet with 2 turns activity    Assist    Wheelchair 50 feet with 2 turns activity did not occur: Safety/medical concerns(decreased strength/activity tolerance)   Assist Level: Supervision/Verbal cueing   Wheelchair 150 feet activity     Assist  Wheelchair 150 feet activity did not occur: Safety/medical concerns(decreased strength/activity tolerance)       Blood pressure 112/67, pulse 71, temperature 98.2 F (36.8 C), temperature source Oral, resp. rate 18, weight 78.1 kg, SpO2 94 %.  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 Antithrombotics: -DVT/anticoagulation:Pharmaceutical:Heparin -antiplatelet therapy: ASA 3. Pain Management:  Lidoderm patch added for right chest wall pain, which appears to be musculoskeletal given history and reproducibility- resolved 4. Mood:LCSW to follow for evaluation and support. Does not have full awareness of deficits. -antipsychotic agents: qhs seroquel  -improving sleep patterns  -Increased pm seroquel to 75mg  at 2100 with good results, will consider weaning 5. Neuropsych: This patientis not fullycapable of making decisions on hisown behalf. 6. Skin/Wound Care:Routine pressure relief measures 7. Fluids/Electrolytes/Nutrition:strict I/Os. Encourage PO  -RD following for intake   -Continue Megace 8.CAD with STEMI/VTarrest: Treated medically for now.  -LifeVest plan per Dr Sung Amabile, continues to await -continueCoreg twice daily and Lipitor  -has tolerated activities with therapy without issues thus far 9.Pneumonitis question due to amiodarone: off of prednisone 10.T2DM with hyperglycemia:    -Continue CM diet  -Increased levemir to 10u bid on 9/17   CBGs ordered  Hemoglobin A1c 6.6 on 9/18  Labile on 9/20 11.Acute renal failure due to ischemic ATN:  -nephrology following, appreciate recs Creatinine 2.41 on 9/20   Continue to monitor 12. Urine retention: -continue  Flomax   13. Acute blood loss anemia:     Hemoglobin 10.3 on 9/17 14.Combined heart failure/ischemic cardiomyopathy: Monitor for signs of overload and check weights daily.    Filed Weights   05/11/19 1700 05/12/19 0339 05/13/19 0447  Weight: 75.5 kg 75.8 kg 78.1 kg    -On Coreg twice daily.   -No ACE/arms or BB due to AKI and shock  ?  Reliability on 9/20  See #8 15.Leukocytosis/Thrombocytosis: Afebrile off antibiotics. reactive/secondary to steroids.   No active signs of infection.  Afebrile  WBC down to 15.8 on 9/17  Platelets 658 on 9/17  Labs ordered for tomorrow 16.  Constipation:     Improving 17.  Hyponatremia  Sodium 134 on 9/20  Continue to monitor 18.  Dysphagia  D3 thins, advance as tolerated  LOS: 9 days A FACE TO FACE EVALUATION WAS PERFORMED   Lorie Phenix 05/13/2019, 1:55 PM

## 2019-05-14 ENCOUNTER — Telehealth (HOSPITAL_COMMUNITY): Payer: Self-pay

## 2019-05-14 ENCOUNTER — Inpatient Hospital Stay (HOSPITAL_COMMUNITY): Payer: PPO | Admitting: Speech Pathology

## 2019-05-14 ENCOUNTER — Inpatient Hospital Stay (HOSPITAL_COMMUNITY): Payer: PPO

## 2019-05-14 ENCOUNTER — Inpatient Hospital Stay (HOSPITAL_COMMUNITY): Payer: PPO | Admitting: Occupational Therapy

## 2019-05-14 LAB — BASIC METABOLIC PANEL
Anion gap: 7 (ref 5–15)
BUN: 53 mg/dL — ABNORMAL HIGH (ref 8–23)
CO2: 23 mmol/L (ref 22–32)
Calcium: 8.9 mg/dL (ref 8.9–10.3)
Chloride: 105 mmol/L (ref 98–111)
Creatinine, Ser: 2.39 mg/dL — ABNORMAL HIGH (ref 0.61–1.24)
GFR calc Af Amer: 31 mL/min — ABNORMAL LOW (ref >60)
GFR calc non Af Amer: 27 mL/min — ABNORMAL LOW (ref >60)
Glucose, Bld: 121 mg/dL — ABNORMAL HIGH (ref 70–99)
Potassium: 4.8 mmol/L (ref 3.5–5.1)
Sodium: 135 mmol/L (ref 135–145)

## 2019-05-14 LAB — CBC
HCT: 27.9 % — ABNORMAL LOW (ref 39.0–52.0)
Hemoglobin: 9.2 g/dL — ABNORMAL LOW (ref 13.0–17.0)
MCH: 31.7 pg (ref 26.0–34.0)
MCHC: 33 g/dL (ref 30.0–36.0)
MCV: 96.2 fL (ref 80.0–100.0)
Platelets: 449 10*3/uL — ABNORMAL HIGH (ref 150–400)
RBC: 2.9 MIL/uL — ABNORMAL LOW (ref 4.22–5.81)
RDW: 15.3 % (ref 11.5–15.5)
WBC: 16.7 10*3/uL — ABNORMAL HIGH (ref 4.0–10.5)
nRBC: 0 % (ref 0.0–0.2)

## 2019-05-14 LAB — GLUCOSE, CAPILLARY
Glucose-Capillary: 144 mg/dL — ABNORMAL HIGH (ref 70–99)
Glucose-Capillary: 133 mg/dL — ABNORMAL HIGH (ref 70–99)
Glucose-Capillary: 212 mg/dL — ABNORMAL HIGH (ref 70–99)
Glucose-Capillary: 206 mg/dL — ABNORMAL HIGH (ref 70–99)

## 2019-05-14 MED ORDER — INSULIN DETEMIR 100 UNIT/ML ~~LOC~~ SOLN
12.0000 [IU] | Freq: Two times a day (BID) | SUBCUTANEOUS | Status: DC
  Administered 2019-05-14 – 2019-05-16 (×5): 12 [IU] via SUBCUTANEOUS
  Filled 2019-05-14 (×5): qty 0.12

## 2019-05-14 MED ORDER — ISOSORBIDE MONONITRATE ER 30 MG PO TB24
30.0000 mg | ORAL_TABLET | Freq: Every day | ORAL | Status: DC
  Administered 2019-05-15 – 2019-05-16 (×2): 30 mg via ORAL
  Filled 2019-05-14 (×2): qty 1

## 2019-05-14 MED ORDER — HYDRALAZINE HCL 25 MG PO TABS
12.5000 mg | ORAL_TABLET | Freq: Three times a day (TID) | ORAL | Status: DC
  Administered 2019-05-15 – 2019-05-16 (×4): 12.5 mg via ORAL
  Filled 2019-05-14 (×3): qty 1

## 2019-05-14 MED ORDER — INSULIN DETEMIR 100 UNIT/ML ~~LOC~~ SOLN
12.0000 [IU] | Freq: Two times a day (BID) | SUBCUTANEOUS | Status: DC
  Filled 2019-05-14: qty 0.12

## 2019-05-14 MED ORDER — ACETAMINOPHEN 325 MG PO TABS: 325.0000 mg | ORAL_TABLET | ORAL | Status: DC | PRN

## 2019-05-14 MED ORDER — QUETIAPINE FUMARATE 50 MG PO TABS
50.0000 mg | ORAL_TABLET | Freq: Every day | ORAL | Status: DC
  Administered 2019-05-14 – 2019-05-15 (×2): 50 mg via ORAL
  Filled 2019-05-14 (×2): qty 1

## 2019-05-14 NOTE — Progress Notes (Signed)
Physical Therapy Session Note  Patient Details  Name: Lawrence Olson [name] [id number] Date of Birth: 01-01-1951  Today's Date: 05/14/2019 PT Individual Time: 0918-0958 PT Individual Time Calculation (min): 40 min   Short Term Goals: Week 2:  PT Short Term Goal 1 (Week 2): =LTG due to ELOS  Skilled Therapeutic Interventions/Progress Updates:    Functional gait on unit with RW > 150' with close supervision occasional steadying assist during turns or obstacle negotiation. Dynamic gait through obstacle course to work on balance and coordination and home environment mobility navigating turns, toe taps, and stepping over objects with supervision/CGA. Gait training without AD to challenge balance and progress functional mobility x 50' with CGA overall due to balance impairments. Stair negotiation training for home access with use of rails for access to second floor x 8 steps x 2 reps with seated break between sets with overall CGA to supervision with repetition. Curb step negotiation with RW (cues for technique) and CGA for balance for home entry simulation. Pt's wife present during session and denies questions in regards to d/c. Pt issued [place] for continued strengthening and balance training. Reports RW was already ordered and no other equipment recommended by PT at this time.   Therapy Documentation Precautions:  Precautions Precautions: Fall Restrictions Weight Bearing Restrictions: No     Pain: Denies pain.   Therapy/Group: Individual Therapy  Canary Brim Ivory Broad, PT, DPT, CBIS  05/14/2019, 10:56 AM

## 2019-05-14 NOTE — Telephone Encounter (Signed)
Travel insurance cancellation form filled out for [employer] for the pt. Form signed and faxed. Original to be scanned into chart.

## 2019-05-14 NOTE — Plan of Care (Signed)
  Problem: RH Wheelchair Mobility Goal: LTG Patient will propel w/c in controlled environment (PT) Description: LTG: Patient will propel wheelchair in controlled environment, # of feet with assist (PT) Outcome: Not Applicable Flowsheets (Taken 05/14/2019 1102) LTG: Pt will propel w/c in controlled environ  assist needed:: (D/c due to primary ambulator.) -- Note: D/c due to primary ambulator. Goal: LTG Patient will propel w/c in home environment (PT) Description: LTG: Patient will propel wheelchair in home environment, # of feet with assistance (PT). 05/14/2019 1103 by Lars Masson, PT Outcome: Not Applicable 2/39/3594 0905 by Lars Masson, PT Flowsheets (Taken 05/14/2019 1102) LTG: Pt will propel w/c in home environ  assist needed:: (D/c due to primary ambulator.) -- Note: D/c due to primary ambulator.

## 2019-05-14 NOTE — Progress Notes (Signed)
Social Work Discharge Note   The overall goal for the admission was met for:   Discharge location: [place] TO ASSIST  Length of Stay: Yes-12 DAYS  Discharge activity level: Yes-MIN ASSIST LEVEL  Home/community participation: Yes  Services provided included: MD, RD, PT, OT, SLP, RN, CM, Pharmacy and [place]: Private Insurance: [place]  Follow-up services arranged: Home Health: [facility]-PT, OT, SP, RN, DME: [facility] [place] and Patient/Family has no preference for HH/DME agencies  Comments (or additional information: [name]-WIFE  Patient/Family verbalized understanding of follow-up arrangements: Yes  Individual responsible for coordination of the follow-up plan: WIFE  Confirmed correct DME delivered: Elease Hashimoto 05/14/2019    Elease Hashimoto

## 2019-05-14 NOTE — Progress Notes (Signed)
Speech Language Pathology Daily Session Note  Patient Details  Name: Lawrence Olson [name] [id number] Date of Birth: May 17, 1951  Today's Date: 05/14/2019 SLP Individual Time: 0815-0858 SLP Individual Time Calculation (min): 43 min  Short Term Goals: Week 2: SLP Short Term Goal 1 (Week 2): STG = LTG due to ELOS  Skilled Therapeutic Interventions: Pt was seen for skilled ST targeting dysphagia and cognition. SLP facilitated session by providing upgraded trials of regular solids with thin liquids. Pt demonstrated efficient mastication and oral clearance of solids, using liquid wash to assist with clearance Mod I. No overt s/s aspiration observed throughout intake. Based on presentation, recommend pt upgrade to regular heart healthy solid diet, continue thin liquids. Pt and wife excited about pt's advancement to regular textures. SLP also facilitated session with Mod A for organization and verbal problem solving during a complex deductive reasoning scheduling task (furniture delivery). Pt demonstrated extremely poor frustration tolerance today, eventually stating he didn't see the point in completing activity and refused to complete it. SLP educated pt regarding rationale for such tasks (interpreting and organizing information that is presented out of sequence, etc.). Pt was agreeable to begin a different visual logic activity, during which he identified illogical information/mistakes on a November calendar with Min faded to Supervision A question cues from clinician. Pt was left in wheelchair with all needs within reach and wife still present. Continue per current plan of care.      Pain Pain Assessment Pain Scale: Faces Pain Score: 0-No pain Faces Pain Scale: No hurt  Therapy/Group: Individual Therapy  Arbutus Leas 05/14/2019, 10:16 AM

## 2019-05-14 NOTE — Progress Notes (Signed)
Occupational Therapy Session Note  Patient Details  Name: Lawrence Olson [name] [id number] Date of Birth: November 12, 1950  Today's Date: 05/14/2019 OT Individual Time: 1100-1145 OT Individual Time Calculation (min): 45 min    Short Term Goals: Week 1:  OT Short Term Goal 1 (Week 1): Pt will complete bathing with mod assist at sit > stand level with mod cues for initiation/sequencing OT Short Term Goal 1 - Progress (Week 1): Met OT Short Term Goal 2 (Week 1): Pt will complete UB dressing with mod assist with mod cues for initiation/sequencing OT Short Term Goal 2 - Progress (Week 1): Met OT Short Term Goal 3 (Week 1): Pt will complete LB dressing with max assist OT Short Term Goal 4 (Week 1): Pt will complete 1 grooming task in standing with mod assist for standing balance OT Short Term Goal 4 - Progress (Week 1): Met  Skilled Therapeutic Interventions/Progress Updates:    Patient seated in w/c, wife present.  SPT and ambulation with RW to/from room and therapy gym with CS.  He completed alternating seated and standing core mobility, balance, conditioning, stretching and coordination activities with fair tolerance.  CS/CG required for all activities in stance but fatigue noted after each task.  Completed pipe tree design copy task with min cues and increased time to complete.  He is pleasant and cooperative t/o session.  Reviewed energy conservation and activity plan post discharge - patient and wife with good understanding demonstrated.  Returned to room where he sat in w/c and requested to end session 15 minutes early due to amount of therapy this am and fatigue.    Therapy Documentation Precautions:  Precautions Precautions: Fall Restrictions Weight Bearing Restrictions: No RLE Weight Bearing: Non weight bearing LLE Weight Bearing: Non weight bearing General: General OT Amount of Missed Time: 15 Minutes Vital Signs:   Pain: Pain Assessment Pain Scale: 0-10 Pain Score: 0-No pain Faces  Pain Scale: No hurt   Therapy/Group: Individual Therapy  Carlos Levering 05/14/2019, 11:49 AM

## 2019-05-14 NOTE — Progress Notes (Addendum)
Patient ID: Lawrence Olson, male   DOB: 1951/03/13, 68 y.o.   [name] [id number]   Advanced Heart Failure Team  Note   Primary Physician: Patient, No Pcp Per PCP-Cardiologist:  No primary care provider on file.  HPI:    Moved to CIR on 9/11  Continues to feel stronger. Plans to start wearing Life Vest later today. Denies chest pain. Denies dyspnea.   Objective:    Vital Signs:   Temp:  [97.9 F (36.6 C)-98.6 F (37 C)] 98.1 F (36.7 C) (09/21 1401) Pulse Rate:  [68-81] 81 (09/21 1401) Resp:  [16-19] 19 (09/21 1401) BP: (115-124)/(70-76) 117/70 (09/21 1401) SpO2:  [95 %-98 %] 96 % (09/21 1401) Weight:  [79.2 kg] 79.2 kg (09/21 0447) Last BM Date: 05/14/19  Weight change: Filed Weights   05/12/19 0339 05/13/19 0447 05/14/19 0447  Weight: 75.8 kg 78.1 kg 79.2 kg    Intake/Output:   Intake/Output Summary (Last 24 hours) at 05/14/2019 1423 Last data filed at 05/14/2019 1419 Gross per 24 hour  Intake 610 ml  Output 1425 ml  Net -815 ml      Physical Exam  General: In bed.  No resp difficulty HEENT: normal Neck: supple. no JVD. Carotids 2+ bilat; no bruits. No lymphadenopathy or thryomegaly appreciated. Cor: PMI nondisplaced. Regular rate & rhythm. No rubs, gallops or murmurs. Lungs: clear Abdomen: soft, nontender, nondistended. No hepatosplenomegaly. No bruits or masses. Good bowel sounds. Extremities: no cyanosis, clubbing, rash, edema Neuro: alert & orientedx3, cranial nerves grossly intact. moves all 4 extremities w/o difficulty. Affect pleasant  NOT On Telemetry   Labs   Basic Metabolic Panel: Recent Labs  Lab 05/10/19 0704 05/11/19 0600 05/12/19 0526 05/13/19 0551 05/14/19 1222  NA 136 134* 132* 134* 135  K 5.1 4.4 4.6 4.5 4.8  CL 103 103 99 100 105  CO2 22 22 20* 23 23  GLUCOSE 113* 125* 143* 179* 121*  BUN 70* 66* 67* 58* 53*  CREATININE 2.41* 2.51* 2.39* 2.41* 2.39*  CALCIUM 8.9 8.6* 8.7* 8.7* 8.9  MG  --  1.9  --   --   --     Liver Function  Tests: No results for input(s): AST, ALT, ALKPHOS, BILITOT, PROT, ALBUMIN in the last 168 hours. No results for input(s): LIPASE, AMYLASE in the last 168 hours. No results for input(s): AMMONIA in the last 168 hours.  CBC: Recent Labs  Lab 05/10/19 0704 05/14/19 1222  WBC 15.8* 16.7*  NEUTROABS 10.5*  --   HGB 10.3* 9.2*  HCT 31.3* 27.9*  MCV 95.4 96.2  PLT 658* 449*    Cardiac Enzymes: No results for input(s): CKTOTAL, CKMB, CKMBINDEX, TROPONINI in the last 168 hours.  BNP: BNP (last 3 results) No results for input(s): BNP in the last 8760 hours.  ProBNP (last 3 results) No results for input(s): PROBNP in the last 8760 hours.   CBG: Recent Labs  Lab 05/10/19 2008 05/13/19 1703 05/13/19 2107 05/14/19 0602 05/14/19 1153  GLUCAP 166* 234* 153* 144* 133*    Coagulation Studies: No results for input(s): LABPROT, INR in the last 72 hours.   Imaging   No results found.   Medications:     Current Medications: . aspirin  81 mg Oral Daily  . atorvastatin  80 mg Oral q1800  . B-complex with vitamin C  1 tablet Oral Daily  . carvedilol  3.125 mg Oral BID WC  . chlorhexidine  15 mL Mouth Rinse BID  . Chlorhexidine Gluconate Cloth  6 each Topical Daily  . famotidine  20 mg Oral Daily  . feeding supplement (PRO-STAT SUGAR FREE 64)  30 mL Oral BID  . heparin  5,000 Units Subcutaneous Q8H  . insulin detemir  12 Units Subcutaneous Q12H  . lidocaine  1 patch Transdermal Q24H  . mouth rinse  15 mL Mouth Rinse q12n4p  . polyethylene glycol  17 g Oral Daily  . Ensure Max Protein  11 oz Oral Daily  . QUEtiapine  50 mg Oral QHS  . sodium chloride flush  10-40 mL Intracatheter Q12H  . tamsulosin  0.4 mg Oral QPC supper    Infusions:   Assessment/Plan   1. CAD: Anterolateral STEMI, cath 8/24 showed occluded proximal LAD, unable to open.  Once we see how he recovers neurologically, will need to decide on feasibility of re-attempt PCI versus CABG. Medical therapy for  the forseeable future. - No chest pain.  - Continue medical therapy with ASA + statin and carvedilol   2. Acute systolic HF - Echo with EF 25%, normal RV.  - Stable from HF perspective.  - Volume status stable.  - No ACE/ARB/ARNI with AKI - On low dose carvedilol  - Volume status stable. No change. K stable.  - Consider hyrdal nitrates .   3. AKI:  -Rena functions continues to trend down.  - Plans to follow up as an outpt. No plan for HD.  - Creatinine 2.4  - BUN down to 53    4. Cardiac arrest/VF:  - Will need LifeVest once strong enough to push alert deactivation button.  - [employer] rep will follow up later today and talk to his daughter. He is planning to wear later today.  Suspect this will be int he next day or two - Keep K > 4.0/Mg 2.0  5. Severe Deconditioning.  Getting stronger with PT/OT/Speech   Amy Clegg NP-C  05/14/2019 2:23 PM   Patient seen and examined with the above-signed Advanced Practice Provider and/or Housestaff. I personally reviewed laboratory data, imaging studies and relevant notes. I independently examined the patient and formulated the important aspects of the plan. I have edited the note to reflect any of my changes or salient points. I have personally discussed the plan with the patient and/or family.  LifeVest fitted this evening and he was wearing it when I came to see him. He looks so much better. Stronger. Eating well. No CP or SOB. Creatinine plateued at 2.4.  Will add hydralazine 12.5 tid and Imdur 30 daily. No ACE/ARB/ARNI or spiro with CKD and hyperkalemia.   Glori Bickers, MD  10:46 PM

## 2019-05-14 NOTE — Progress Notes (Signed)
Occupational Therapy Session Note  Patient Details  Name: Lawrence Olson [name] [id number] Date of Birth: 20-Jun-1951  Today's Date: 05/14/2019 OT Individual Time: 1400-1439 OT Individual Time Calculation (min): 39 min    Short Term Goals: Week 2:  OT Short Term Goal 1 (Week 2): STG= LTG d/t ELOS  Skilled Therapeutic Interventions/Progress Updates:    Pt received supine with no c/o pain. Pt reporting fatigue and requesting to "take it easy" this session. Pt completed 200 ft of functional mobility with RW to the therapy gym with (S). Pt completed BUE strengthening circuit with 4 # dumbbells. Focus on biceps, triceps, deltoids, pec's, and lat's with added cardiorespiratory endurance via completing some sets in standing or with sit <> stand component. 3x10 repetitions each exercise. Discussed potential HEP and increasing strength in prep for cardiac sx in the future. All VSS throughout session. Pt returned to his room and was left sitting up with all needs met, wife present.    Therapy Documentation Precautions:  Precautions Precautions: Fall Restrictions Weight Bearing Restrictions: No    Therapy/Group: Individual Therapy  Curtis Sites 05/14/2019, 7:21 AM

## 2019-05-14 NOTE — Progress Notes (Signed)
[facility] PHYSICAL MEDICINE & REHABILITATION PROGRESS NOTE   Subjective/Complaints: Up at EOB. Anxious to get home. No new complaints other than being "bored" yesterday  ROS: Patient denies fever, rash, sore throat, blurred vision, nausea, vomiting, diarrhea, cough, shortness of breath or chest pain, joint or back pain, headache, or mood change.   Objective:    No results found. No results for input(s): WBC, HGB, HCT, PLT in the last 72 hours. Recent Labs    05/12/19 0526 05/13/19 0551  NA 132* 134*  K 4.6 4.5  CL 99 100  CO2 20* 23  GLUCOSE 143* 179*  BUN 67* 58*  CREATININE 2.39* 2.41*  CALCIUM 8.7* 8.7*    Intake/Output Summary (Last 24 hours) at 05/14/2019 0852 Last data filed at 05/14/2019 0700 Gross per 24 hour  Intake 730 ml  Output 1425 ml  Net -695 ml     Physical Exam: Vital Signs Blood pressure 124/73, pulse 68, temperature 98.6 F (37 C), temperature source Oral, resp. rate 16, weight 79.2 kg, SpO2 95 %. Constitutional: No distress . Vital signs reviewed. HEENT: EOMI, oral membranes moist Neck: supple Cardiovascular: RRR without murmur. No JVD    Respiratory: CTA Bilaterally without wheezes or rales. Normal effort    GI: BS +, non-tender, non-distended  Skin: Warm and dry.  Intact. Psych: pleasant Musc: No edema in extremities.  No tenderness in extremities. Neurologic: Alert Motor: Grossly 4-4+/5 throughout    Assessment/Plan: 1. 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  Physiatrist is providing close team supervision and 24 hour management of active medical problems listed below.  Physiatrist and rehab team continue to assess barriers to discharge/monitor patient progress toward functional and medical goals  Care Tool:  Bathing  Bathing activity did not occur: Safety/medical concerns Body parts bathed by patient:  Right arm, Left arm, Chest, Abdomen, Face   Body parts bathed by helper: Front perineal area, Buttocks, Right upper leg, Left upper leg, Right lower leg, Left lower leg     Bathing assist Assist Level: Supervision/Verbal cueing     Upper Body Dressing/Undressing Upper body dressing   What is the patient wearing?: Pull over shirt    Upper body assist Assist Level: Minimal Assistance - Patient > 75%    Lower Body Dressing/Undressing Lower body dressing      What is the patient wearing?: Incontinence brief     Lower body assist Assist for lower body dressing: Minimal Assistance - Patient > 75%     Toileting Toileting    Toileting assist Assist for toileting: Moderate Assistance - Patient 50 - 74%     Transfers Chair/bed transfer  Transfers assist     Chair/bed transfer assist level: Contact Guard/Touching assist     Locomotion Ambulation   Ambulation assist      Assist level: Contact Guard/Touching assist Assistive device: Walker-rolling Max distance: 150'   Walk 10 feet activity   Assist  Walk 10 feet activity did not occur: Safety/medical concerns(decreased strength/activity tolerance)  Assist level: Contact Guard/Touching assist Assistive device: Walker-rolling   Walk 50 feet activity   Assist Walk 50 feet with 2 turns activity did not occur: Safety/medical concerns(decreased strength/activity tolerance)  Assist level: Contact Guard/Touching assist Assistive device: Walker-rolling    Walk 150 feet activity   Assist Walk 150 feet activity did not occur: Safety/medical concerns(decreased strength/activity tolerance)  Assist level: Contact Guard/Touching assist Assistive device: Walker-rolling  Walk 10 feet on uneven surface  activity   Assist Walk 10 feet on uneven surfaces activity did not occur: Safety/medical concerns(decreased strength/activity tolerance)   Assist level: Minimal Assistance - Patient > 75% Assistive device:  Aeronautical engineer   Type of Wheelchair: Manual Wheelchair activity did not occur: Safety/medical concerns(decreased strength/activity tolerance)  Wheelchair assist level: Supervision/Verbal cueing Max wheelchair distance: 100    Wheelchair 50 feet with 2 turns activity    Assist    Wheelchair 50 feet with 2 turns activity did not occur: Safety/medical concerns(decreased strength/activity tolerance)   Assist Level: Supervision/Verbal cueing   Wheelchair 150 feet activity     Assist  Wheelchair 150 feet activity did not occur: Safety/medical concerns(decreased strength/activity tolerance)       Blood pressure 124/73, pulse 68, temperature 98.6 F (37 C), temperature source Oral, resp. rate 16, weight 79.2 kg, SpO2 95 %.  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 Antithrombotics: -DVT/anticoagulation:Pharmaceutical:Heparin -antiplatelet therapy: ASA 3. Pain Management:  Lidoderm patch added for right chest wall pain, which appears to be musculoskeletal given history and reproducibility- resolved 4. Mood:LCSW to follow for evaluation and support. Does not have full awareness of deficits. -antipsychotic agents: qhs seroquel  -improved sleep patterns  -Increased pm seroquel to 75mg  at 2100. Decrease to 50mg  tonight 5. Neuropsych: This patientis not fullycapable of making decisions on hisown behalf. 6. Skin/Wound Care:Routine pressure relief measures 7. Fluids/Electrolytes/Nutrition:strict I/Os. Encourage PO  -RD following for intake   -Continue Megace 8.CAD with STEMI/VTarrest: Treated medically for now.  -LifeVest plan per Dr Sung Amabile, continues to await -continueCoreg twice daily and Lipitor  -has tolerated activities with therapy without issues on rehab 9.Pneumonitis question  due to amiodarone: off of prednisone 10.T2DM with hyperglycemia:   -Continue CM diet  -Increased levemir to 10u bid on 9/17 ---increase to 12u bid today  CBGs ordered  Hemoglobin A1c 6.6 on 9/18 11.Acute renal failure due to ischemic ATN:  -nephrology following, appreciate recs Creatinine 2.41 on 9/20   Continue to monitor 12. Urine retention: -continue  Flomax   13. Acute blood loss anemia:     Hemoglobin 10.3 on 9/17 14.Combined heart failure/ischemic cardiomyopathy: Monitor for signs of overload and check weights daily.    Filed Weights   05/12/19 0339 05/13/19 0447 05/14/19 0447  Weight: 75.8 kg 78.1 kg 79.2 kg    -On Coreg twice daily.   -No ACE/arms or BB due to AKI and shock  Weight trending up?  Volume mgt per cards 15.Leukocytosis/Thrombocytosis: Afebrile off antibiotics. reactive/secondary to steroids.   No active signs of infection.  Afebrile  WBC down to 15.8 on 9/17  Platelets 658 on 9/17  Labs pending 16.  Constipation:     Improving 17.  Hyponatremia  Sodium 134 on 9/20  Continue to monitor 18.  Dysphagia  D3 thins, advance as tolerated  LOS: 10 days A FACE TO FACE EVALUATION WAS PERFORMED  Meredith Staggers 05/14/2019, 8:52 AM

## 2019-05-15 ENCOUNTER — Inpatient Hospital Stay (HOSPITAL_COMMUNITY): Payer: PPO | Admitting: Speech Pathology

## 2019-05-15 ENCOUNTER — Inpatient Hospital Stay (HOSPITAL_COMMUNITY): Payer: PPO

## 2019-05-15 LAB — GLUCOSE, CAPILLARY
Glucose-Capillary: 155 mg/dL — ABNORMAL HIGH (ref 70–99)
Glucose-Capillary: 123 mg/dL — ABNORMAL HIGH (ref 70–99)
Glucose-Capillary: 141 mg/dL — ABNORMAL HIGH (ref 70–99)
Glucose-Capillary: 153 mg/dL — ABNORMAL HIGH (ref 70–99)

## 2019-05-15 LAB — BASIC METABOLIC PANEL
Anion gap: 8 (ref 5–15)
BUN: 51 mg/dL — ABNORMAL HIGH (ref 8–23)
CO2: 22 mmol/L (ref 22–32)
Calcium: 8.7 mg/dL — ABNORMAL LOW (ref 8.9–10.3)
Chloride: 107 mmol/L (ref 98–111)
Creatinine, Ser: 2.39 mg/dL — ABNORMAL HIGH (ref 0.61–1.24)
GFR calc Af Amer: 31 mL/min — ABNORMAL LOW (ref >60)
GFR calc non Af Amer: 27 mL/min — ABNORMAL LOW (ref >60)
Glucose, Bld: 152 mg/dL — ABNORMAL HIGH (ref 70–99)
Potassium: 4.6 mmol/L (ref 3.5–5.1)
Sodium: 137 mmol/L (ref 135–145)

## 2019-05-15 MED ORDER — BLOOD GLUCOSE MONITOR KIT: PACK | 0 refills | Status: AC

## 2019-05-15 MED ORDER — FUROSEMIDE 40 MG PO TABS
40.0000 mg | ORAL_TABLET | ORAL | Status: DC
  Administered 2019-05-15 – 2019-05-16 (×2): 40 mg via ORAL
  Filled 2019-05-15 (×2): qty 1

## 2019-05-15 NOTE — Progress Notes (Signed)
Occupational Therapy Discharge Summary  Patient Details  Name: Lawrence Olson [name] [id number] Date of Birth: 11-27-1950  Today's Date: 05/15/2019 OT Individual Time: 1030-1130 OT Individual Time Calculation (min): 60 min   Session 2:  OT Individual Time: 4650-3546 OT Individual Time Calculation (min): 15 min   Patient has met 12 of 12 long term goals due to improved activity tolerance, improved balance, postural control, ability to compensate for deficits, improved attention, improved awareness and improved coordination.  Patient to discharge at overall Supervision level.  Patient's care partner is independent to provide the necessary cognitive assistance at discharge.  Family education has been completed with pt's wife and they are both confident in his d/c.   Reasons goals not met: All goals met.   Recommendation:  Patient will benefit from ongoing skilled OT services in home health setting to continue to advance functional skills in the area of BADL.  Equipment: TTB  Reasons for discharge: treatment goals met and discharge from hospital  Patient/family agrees with progress made and goals achieved: Yes   Skilled OT intervention:  Pt received supine, no c/o pain, no request for ADLs. Edu provided re lifevest and potentially getting defibrillator- all questions answered within OT scope. Pt completed 200 ft of functional mobility with (S)- mod I using RW. In the dayroom pt completed dynamic standing balance training using the agility ladder. Activity graded via altering BOS and planes of movement. Pt required CGA- (S) throughout task. Pt required seated rest breaks frequently. Pt completed functional backward stepping to simulate navigating home thresholds during ADL transfers. Pt completed item retrieval from the ground, CGA throughout with increased SOB following, requiring extended rest break. Dynamometer R hand: 34 lbs L hand 39 lbs. Pt completed 2x 5 min intervals on the Nustep at  level 6 resistance to increase functional activity tolerance and cardiovascular endurance. Pt reporting 13 on the BORG after each trial. Reinforced edu on using the BORG at home and f/u with cardiology. Pt returned to his room and was left supine with all needs met.   9 hole peg test completed: RUE: 24.99 seconds     LUE: 28.17 seconds Session 2: Pt received up in w/c with no c/o pain but c/o high levels of fatigue d/t not sleeping well with new lifevest placement. Pt agreeable to shortened session. Pt completed 2 trials of 250 ft of functional mobility with (S). Pt required min cueing for seated rest break. Pt left sitting up in his w/c with all needs met.   OT Discharge Precautions/Restrictions  Precautions Precautions: Fall Precaution Comments: New lifevest Restrictions Weight Bearing Restrictions: No   Pain Pain Assessment Pain Scale: 0-10 Pain Score: 0-No pain ADL ADL Eating: Modified independent Where Assessed-Eating: Chair Grooming: Modified independent Where Assessed-Grooming: Chair Upper Body Bathing: Modified independent Where Assessed-Upper Body Bathing: Shower Lower Body Bathing: Supervision/safety Where Assessed-Lower Body Bathing: Shower Upper Body Dressing: Supervision/safety Where Assessed-Upper Body Dressing: Sitting at sink Lower Body Dressing: Supervision/safety Where Assessed-Lower Body Dressing: Chair Toileting: Supervision/safety Where Assessed-Toileting: Glass blower/designer: Distant supervision Armed forces technical officer Method: Human resources officer: Distant supervision Tub/Shower Transfer Method: Optometrist: Nurse, mental health Transfer: Distant supervision Social research officer, government Method: Heritage manager: Civil engineer, contracting with back Vision Baseline Vision/History: Wears glasses Wears Glasses: At all times Patient Visual Report: No change from baseline Vision Assessment?: No apparent visual  deficits Perception  Perception: Within Functional Limits Praxis Praxis: Intact Cognition Overall Cognitive Status: Impaired/Different from baseline Arousal/Alertness: Awake/alert Orientation Level: Oriented  X4 Attention: Selective Selective Attention: Appears intact Memory: Impaired Memory Impairment: Decreased recall of new information;Retrieval deficit Awareness Impairment: Anticipatory impairment Problem Solving: Impaired Safety/Judgment: Impaired Sensation Sensation Light Touch: Appears Intact Coordination Gross Motor Movements are Fluid and Coordinated: Yes Fine Motor Movements are Fluid and Coordinated: Yes Coordination and Movement Description: Some generalized weakness still remains, but coordination has improved significantly Motor  Motor Motor: Other (comment) Motor - Discharge Observations: generalized weakness Mobility  Bed Mobility Bed Mobility: Rolling Right;Rolling Left;Supine to Sit;Sit to Supine Rolling Right: Supervision/verbal cueing Rolling Left: Supervision/Verbal cueing Supine to Sit: Supervision/Verbal cueing Sit to Supine: Supervision/Verbal cueing Transfers Sit to Stand: Supervision/Verbal cueing Stand to Sit: Supervision/Verbal cueing  Trunk/Postural Assessment  Cervical Assessment Cervical Assessment: Within Functional Limits Thoracic Assessment Thoracic Assessment: Within Functional Limits Lumbar Assessment Lumbar Assessment: Within Functional Limits Postural Control Postural Control: Deficits on evaluation Righting Reactions: delayed  Balance Balance Balance Assessed: Yes Static Sitting Balance Static Sitting - Balance Support: No upper extremity supported;Feet supported Static Sitting - Level of Assistance: 6: Modified independent (Device/Increase time) Dynamic Sitting Balance Dynamic Sitting - Balance Support: Feet supported;No upper extremity supported Dynamic Sitting - Level of Assistance: 6: Modified independent  (Device/Increase time) Sitting balance - Comments: During EOB ADLs, no LOB, mod I with sitting balance overall Static Standing Balance Static Standing - Balance Support: Bilateral upper extremity supported;During functional activity Static Standing - Level of Assistance: 6: Modified independent (Device/Increase time) Dynamic Standing Balance Dynamic Standing - Balance Support: Bilateral upper extremity supported Dynamic Standing - Level of Assistance: 5: Stand by assistance Extremity/Trunk Assessment RUE Assessment RUE Assessment: Exceptions to [facility] General Strength Comments: Some generalized weakness, overall WFL LUE Assessment LUE Assessment: Exceptions to [facility] General Strength Comments: Some generalized weakness, overall WFL   Curtis Sites 05/15/2019, 9:02 AM

## 2019-05-15 NOTE — Progress Notes (Signed)
[place] PHYSICAL MEDICINE & REHABILITATION PROGRESS NOTE   Subjective/Complaints: A little restless last night with LV on. Couldn't get comfortable. Anxious to get home!  ROS: Patient denies fever, rash, sore throat, blurred vision, nausea, vomiting, diarrhea, cough, shortness of breath or chest pain, joint or back pain, headache, or mood change.    Objective:    No results found. Recent Labs    05/14/19 1222  WBC 16.7*  HGB 9.2*  HCT 27.9*  PLT 449*   Recent Labs    05/14/19 1222 05/15/19 0443  NA 135 137  K 4.8 4.6  CL 105 107  CO2 23 22  GLUCOSE 121* 152*  BUN 53* 51*  CREATININE 2.39* 2.39*  CALCIUM 8.9 8.7*    Intake/Output Summary (Last 24 hours) at 05/15/2019 0914 Last data filed at 05/15/2019 0846 Gross per 24 hour  Intake 1040 ml  Output 1959 ml  Net -919 ml     Physical Exam: Vital Signs Blood pressure 125/68, pulse 71, temperature 98.6 F (37 C), temperature source Oral, resp. rate 19, weight 79.9 kg, SpO2 97 %. Constitutional: No distress . Vital signs reviewed. LV on HEENT: EOMI, oral membranes moist Neck: supple Cardiovascular: RRR without murmur. No JVD    Respiratory: CTA Bilaterally without wheezes or rales. Normal effort    GI: BS +, non-tender, non-distended  Skin: Warm and dry.  Intact. Psych: pleasant Musc: No edema in extremities.  No tenderness in extremities. Neurologic: Alert Motor: Grossly 4-4+/5 throughout    Assessment/Plan: 1. 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  Physiatrist is providing close team supervision and 24 hour management of active medical problems listed below.  Physiatrist and rehab team continue to assess barriers to discharge/monitor patient progress toward functional and medical goals  Care Tool:  Bathing  Bathing activity did not occur: Safety/medical concerns Body  parts bathed by patient: Right arm, Left arm, Chest, Abdomen, Face, Front perineal area, Buttocks, Left upper leg, Right lower leg, Left lower leg, Right upper leg   Body parts bathed by helper: Front perineal area, Buttocks, Right upper leg, Left upper leg, Right lower leg, Left lower leg     Bathing assist Assist Level: Supervision/Verbal cueing     Upper Body Dressing/Undressing Upper body dressing   What is the patient wearing?: Pull over shirt    Upper body assist Assist Level: Supervision/Verbal cueing    Lower Body Dressing/Undressing Lower body dressing      What is the patient wearing?: Pants     Lower body assist Assist for lower body dressing: Supervision/Verbal cueing     Toileting Toileting    Toileting assist Assist for toileting: Contact Guard/Touching assist     Transfers Chair/bed transfer  Transfers assist     Chair/bed transfer assist level: Supervision/Verbal cueing     Locomotion Ambulation   Ambulation assist      Assist level: Supervision/Verbal cueing Assistive device: Walker-rolling Max distance: 250'   Walk 10 feet activity   Assist  Walk 10 feet activity did not occur: Safety/medical concerns(decreased strength/activity tolerance)  Assist level: Supervision/Verbal cueing Assistive device: Walker-rolling   Walk 50 feet activity   Assist Walk 50 feet with 2 turns activity did not occur: Safety/medical concerns(decreased strength/activity tolerance)  Assist level: Supervision/Verbal cueing Assistive device: Walker-rolling    Walk 150 feet activity   Assist Walk 150 feet activity did not occur: Safety/medical concerns(decreased strength/activity  tolerance)  Assist level: Supervision/Verbal cueing Assistive device: Walker-rolling    Walk 10 feet on uneven surface  activity   Assist Walk 10 feet on uneven surfaces activity did not occur: Safety/medical concerns(decreased strength/activity tolerance)   Assist  level: Supervision/Verbal cueing Assistive device: Aeronautical engineer Will patient use wheelchair at discharge?: No Type of Wheelchair: Manual Wheelchair activity did not occur: Safety/medical concerns(decreased strength/activity tolerance)  Wheelchair assist level: Supervision/Verbal cueing Max wheelchair distance: 100    Wheelchair 50 feet with 2 turns activity    Assist    Wheelchair 50 feet with 2 turns activity did not occur: Safety/medical concerns(decreased strength/activity tolerance)   Assist Level: Supervision/Verbal cueing   Wheelchair 150 feet activity     Assist  Wheelchair 150 feet activity did not occur: Safety/medical concerns(decreased strength/activity tolerance)       Blood pressure 125/68, pulse 71, temperature 98.6 F (37 C), temperature source Oral, resp. rate 19, weight 79.9 kg, SpO2 97 %.  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  2. Antithrombotics: -DVT/anticoagulation:Pharmaceutical:Heparin -antiplatelet therapy: ASA 3. Pain Management:  Lidoderm patch added for right chest wall pain, which appears to be musculoskeletal given history and reproducibility- resolved 4. Mood:LCSW to follow for evaluation and support. Does not have full awareness of deficits. -antipsychotic agents: qhs seroquel  -improved sleep patterns  -Increased pm seroquel to 75mg  at 2100. Decrease to 50mg  tonight 5. Neuropsych: This patientis not fullycapable of making decisions on hisown behalf. 6. Skin/Wound Care:Routine pressure relief measures 7. Fluids/Electrolytes/Nutrition:strict I/Os. Encourage PO  -RD following for intake   -Continue Megace 8.CAD with STEMI/VTarrest: Treated  medically for now.  -LifeVest in place. Defibrillator as outpt -continueCoreg twice daily and Lipitor   9.Pneumonitis question due to amiodarone: off of prednisone 10.T2DM with hyperglycemia:   -Continue CM diet  -Increased levemir to 10u bid on 9/17 ---increase to 12u bid today  CBGs ordered  Hemoglobin A1c 6.6 on 9/18 11.Acute renal failure due to ischemic ATN:  -nephrology following, appreciate recs Creatinine 2.39 9/21 --has plateaued    Continue to monitor 12. Urine retention: -continue  Flomax   13. Acute blood loss anemia:     Hemoglobin 9.2 9/21 14.Combined heart failure/ischemic cardiomyopathy: Monitor for signs of overload and check weights daily.    Filed Weights   05/13/19 0447 05/14/19 0447 05/15/19 0554  Weight: 78.1 kg 79.2 kg 79.9 kg    -On Coreg twice daily.   -No ACE/arms or BB due to AKI and shock  Weight stable at present 15.Leukocytosis/Thrombocytosis: Afebrile off antibiotics. reactive/secondary to steroids.   No active signs of infection.  Afebrile  WBC holding at 16k today  Platelets 658 on 9/17    16.  Constipation:     Improving 17.  Hyponatremia  Sodium 135 on 9/21  Continue to monitor 18.  Dysphagia  On regular heart healthy diet LOS: 11 days A FACE TO FACE EVALUATION WAS PERFORMED  Meredith Staggers 05/15/2019, 9:14 AM

## 2019-05-15 NOTE — Progress Notes (Addendum)
Patient ID: Ennio Houp, male   DOB: 17-May-1951, 68 y.o.   [name] [id number]   Advanced Heart Failure Team  Note   Primary Physician: Patient, No Pcp Per PCP-Cardiologist:  No primary care provider on file.  HPI:    Feels great. Denies CP or SOB. Says it is hard to sleep with LifeVest and he doesn't like it.    Objective:    Vital Signs:   Temp:  [98.1 F (36.7 C)-98.6 F (37 C)] 98.6 F (37 C) (09/22 0416) Pulse Rate:  [71-81] 71 (09/22 0416) Resp:  [19] 19 (09/22 0416) BP: (117-128)/(68-77) 125/68 (09/22 0416) SpO2:  [96 %-98 %] 97 % (09/22 0416) Weight:  [79.9 kg] 79.9 kg (09/22 0554) Last BM Date: 05/14/19  Weight change: Filed Weights   05/13/19 0447 05/14/19 0447 05/15/19 0554  Weight: 78.1 kg 79.2 kg 79.9 kg    Intake/Output:   Intake/Output Summary (Last 24 hours) at 05/15/2019 0922 Last data filed at 05/15/2019 0846 Gross per 24 hour  Intake 1040 ml  Output 1959 ml  Net -919 ml      Physical Exam    General:  Well appearing. No resp difficulty HEENT: normal Neck: supple. no JVD. Carotids 2+ bilat; no bruits. No lymphadenopathy or thryomegaly appreciated. Cor: PMI nondisplaced. Regular rate & rhythm. No rubs, gallops or murmurs. Lungs: clear Abdomen: soft, nontender, nondistended. No hepatosplenomegaly. No bruits or masses. Good bowel sounds. Extremities: no cyanosis, clubbing, rash, edema Neuro: alert & orientedx3, cranial nerves grossly intact. moves all 4 extremities w/o difficulty. Affect pleasant   Labs   Basic Metabolic Panel: Recent Labs  Lab 05/11/19 0600 05/12/19 0526 05/13/19 0551 05/14/19 1222 05/15/19 0443  NA 134* 132* 134* 135 137  K 4.4 4.6 4.5 4.8 4.6  CL 103 99 100 105 107  CO2 22 20* 23 23 22   GLUCOSE 125* 143* 179* 121* 152*  BUN 66* 67* 58* 53* 51*  CREATININE 2.51* 2.39* 2.41* 2.39* 2.39*  CALCIUM 8.6* 8.7* 8.7* 8.9 8.7*  MG 1.9  --   --   --   --     Liver Function Tests: No results for input(s): AST, ALT, ALKPHOS,  BILITOT, PROT, ALBUMIN in the last 168 hours. No results for input(s): LIPASE, AMYLASE in the last 168 hours. No results for input(s): AMMONIA in the last 168 hours.  CBC: Recent Labs  Lab 05/10/19 0704 05/14/19 1222  WBC 15.8* 16.7*  NEUTROABS 10.5*  --   HGB 10.3* 9.2*  HCT 31.3* 27.9*  MCV 95.4 96.2  PLT 658* 449*    Cardiac Enzymes: No results for input(s): CKTOTAL, CKMB, CKMBINDEX, TROPONINI in the last 168 hours.  BNP: BNP (last 3 results) No results for input(s): BNP in the last 8760 hours.  ProBNP (last 3 results) No results for input(s): PROBNP in the last 8760 hours.   CBG: Recent Labs  Lab 05/14/19 0602 05/14/19 1153 05/14/19 1656 05/14/19 2103 05/15/19 0647  GLUCAP 144* 133* 212* 206* 155*    Coagulation Studies: No results for input(s): LABPROT, INR in the last 72 hours.   Imaging   No results found.   Medications:     Current Medications: . aspirin  81 mg Oral Daily  . atorvastatin  80 mg Oral q1800  . B-complex with vitamin C  1 tablet Oral Daily  . carvedilol  3.125 mg Oral BID WC  . chlorhexidine  15 mL Mouth Rinse BID  . Chlorhexidine Gluconate Cloth  6 each Topical Daily  .  famotidine  20 mg Oral Daily  . feeding supplement (PRO-STAT SUGAR FREE 64)  30 mL Oral BID  . heparin  5,000 Units Subcutaneous Q8H  . hydrALAZINE  12.5 mg Oral Q8H  . insulin detemir  12 Units Subcutaneous Q12H  . isosorbide mononitrate  30 mg Oral Daily  . lidocaine  1 patch Transdermal Q24H  . mouth rinse  15 mL Mouth Rinse q12n4p  . polyethylene glycol  17 g Oral Daily  . Ensure Max Protein  11 oz Oral Daily  . QUEtiapine  50 mg Oral QHS  . sodium chloride flush  10-40 mL Intracatheter Q12H  . tamsulosin  0.4 mg Oral QPC supper    Infusions:   Assessment/Plan   1. CAD: Anterolateral STEMI, cath 8/24 showed occluded proximal LAD, unable to open.  Once we see how he recovers neurologically, will need to decide on feasibility of re-attempt PCI  versus CABG. Medical therapy for the forseeable future. - Doing well. No s/s ischemia. - Continue medical therapy with ASA + statin and carvedilol   2. Acute systolic HF - Echo with EF 25%, normal RV.  - Stable from HF perspective.  - Volume status stable.  - No ACE/ARB/ARNI with AKI - On low dose carvedilol, hydral and nitrates started yesterday., - Volume status looks good but weight about 10 poundss.  - Start lasix 40mg  po M/W/F. First today.   3. AKI:  - Renal functions continues to trend down.  - Plans to follow up as an outpt. No plan for HD.  - Creatinine 2.4  - BUN down to 51  4. Cardiac arrest/VF:  - Has lifeVest in place - Plan ICD as outpatient  5. Severe Deconditioning.  Much improved with PT/OT/Speech  Home tomorrow on current meds. Will arrange outpatient f/u in HF Clinic.   Glori Bickers MD 05/15/2019 9:22 AM

## 2019-05-15 NOTE — Progress Notes (Signed)
Physical Therapy Discharge Summary  Patient Details  Name: Lawrence Olson [name] [id number] Date of Birth: [birth year]  Today's Date: 05/15/2019 PT Individual Time: 0800-0900 PT Individual Time Calculation (min): 60 min  Denies pain. Pt reports not sleeping well last night due to Life Vest bothering him and appears a bit anxious throughout session due to Life Vest. HR and O2 remained WNL (initially HR 106 bpm with activity but as session, remained 84-99 bpm with O2 = 92-96% throughout. Session focused on grad day activities in preparation for d/c tomorrow with wife present to finalize family education. Pt instructed in car transfer with overall supervision with cues for safe technique using RW. Gait up/down ramped surface, over mulch and up/down curb step with RW for simulated community mobility. Administered TUG for fall risk assessment (see results below). Stair negotiation training using rails for second floor access simulation with overall supervision and good safety awareness of foot placement. Reviewed HEP provided yesterday and performed exercises for functional strengthening and balance including mini squats, side stepping R/L, backwards walking, tandem gait and balance, and single limb balance. Cues needed for technique. CGA for gait training without AD to challenge balance. Education provided on energy conservation and use of pulse oximeter at home for self monitoring (pt and wife questions about how they can do this). Functional gait training on unit with RW for general strengthening and endurance > 250' with overall supervision. I episode of mild LOB but pt able to self correct without PT intervention. Pt does require more rest breaks this session due to increased fatigue but denies any SOB, chest pain, or discomfort. Pt and wife deny concerns in regards to d/c and excited to go home tomorrow.   Patient has met 10 of 10 long term goals due to improved activity tolerance, improved balance, improved  postural control, increased strength, ability to compensate for deficits, improved attention, improved awareness and improved coordination.  Patient to discharge at an ambulatory level Supervision with RW.  Patient's care partner is independent to provide the necessary supervision assistance at discharge.  Reasons goals not met: n/a - all goals met  Recommendation:  Patient will benefit from ongoing skilled PT services in home health setting to continue to advance safe functional mobility, address ongoing impairments in endurance, functional mobility, balance, gait, and minimize fall risk.  Equipment: RW  Reasons for discharge: treatment goals met and discharge from hospital  Patient/family agrees with progress made and goals achieved: Yes  PT Discharge Precautions/Restrictions Precautions Precautions: Fall Precaution Comments: New lifevest Restrictions Weight Bearing Restrictions: No Vision/Perception  Perception Perception: Within Functional Limits Praxis Praxis: Intact  Cognition Overall Cognitive Status: Impaired/Different from baseline Arousal/Alertness: Awake/alert Orientation Level: Oriented X4 Attention: Selective Selective Attention: Appears intact Memory: Impaired Memory Impairment: Decreased recall of new information;Retrieval deficit Awareness Impairment: Anticipatory impairment Problem Solving: Impaired Behaviors: Impulsive Safety/Judgment: Impaired Sensation Sensation Light Touch: Appears Intact Proprioception: Appears Intact Coordination Gross Motor Movements are Fluid and Coordinated: Yes Fine Motor Movements are Fluid and Coordinated: Yes Coordination and Movement Description: Some generalized weakness still remains, but coordination has improved significantly Motor  Motor Motor: Other (comment) Motor - Discharge Observations: generalized weakness  Mobility Bed Mobility Bed Mobility: Rolling Right;Rolling Left;Supine to Sit;Sit to Supine Rolling  Right: Independent Rolling Left: Independent Supine to Sit: Independent Sit to Supine: Independent Transfers Transfers: Sit to Stand;Stand to Sit;Stand Pivot Transfers Sit to Stand: Supervision/Verbal cueing Stand to Sit: Supervision/Verbal cueing Stand Pivot Transfers: Supervision/Verbal cueing Transfer (Assistive device): Rolling walker Locomotion  Gait  Ambulation: Yes Gait Assistance: Supervision/Verbal cueing Gait Distance (Feet): 250 Feet Assistive device: Rolling walker Gait Gait: Yes Stairs / Additional Locomotion Stairs: Yes Stairs Assistance: Supervision/Verbal cueing Stair Management Technique: Two rails Number of Stairs: 12 Height of Stairs: 6 Ramp: Supervision/Verbal cueing Curb: Supervision/Verbal cueing Wheelchair Mobility Wheelchair Mobility: No  Trunk/Postural Assessment  Cervical Assessment Cervical Assessment: Within Functional Limits Thoracic Assessment Thoracic Assessment: Within Functional Limits Lumbar Assessment Lumbar Assessment: Within Functional Limits Postural Control Postural Control: Deficits on evaluation Righting Reactions: delayed  Balance Balance Balance Assessed: Yes Standardized Balance Assessment Standardized Balance Assessment: Timed Up and Go Test Timed Up and Go Test TUG: Normal TUG Normal TUG (seconds): 24.66(with RW; average 3 trials) Static Sitting Balance Static Sitting - Balance Support: No upper extremity supported;Feet supported Static Sitting - Level of Assistance: 6: Modified independent (Device/Increase time) Dynamic Sitting Balance Dynamic Sitting - Balance Support: Feet supported;No upper extremity supported Dynamic Sitting - Level of Assistance: 6: Modified independent (Device/Increase time) Sitting balance - Comments: During EOB ADLs, no LOB, mod I with sitting balance overall Static Standing Balance Static Standing - Balance Support: Bilateral upper extremity supported;During functional activity Static  Standing - Level of Assistance: 6: Modified independent (Device/Increase time) Dynamic Standing Balance Dynamic Standing - Balance Support: Bilateral upper extremity supported Dynamic Standing - Level of Assistance: 5: Stand by assistance Extremity Assessment  RUE Assessment RUE Assessment: Exceptions to [facility] General Strength Comments: Some generalized weakness, overall WFL LUE Assessment LUE Assessment: Exceptions to [facility] General Strength Comments: Some generalized weakness, overall WFL RLE Assessment General Strength Comments: grossly 4/5 LLE Assessment General Strength Comments: grossly 4/5    Canary Brim Ivory Broad, PT, DPT, CBIS  05/15/2019, 10:54 AM

## 2019-05-15 NOTE — Progress Notes (Signed)
Brief Nutrition Note  RD consulted for diet education regarding heart healthy and carbohydrate modified diet. RD met with both pt and wife at bedside on 05/10/19 and provided education and handouts covering these topics. Please see note from 05/10/19 for details.  If pt and/or wife have additional questions, please contact RD.   Gaynell Face, MS, RD, LDN Inpatient Clinical Dietitian Pager: [phone] Weekend/After Hours: [phone]

## 2019-05-15 NOTE — Patient Care Conference (Signed)
Inpatient RehabilitationTeam Conference and Plan of Care Update Date: 05/15/2019   Time: 10:25 AM    Patient Name: Lawrence Olson      Medical Record Number: [id number]  Date of Birth: Jun 10, 1951 Sex: Male         Room/Bed: 4M11C/4M11C-01 Payor Info: Payor: [employer] / Plan: [employer] PPO / Product Type: *No Product type* /    Admit Date/Time:  05/04/2019 10:15 PM  Primary Diagnosis:  Anoxic brain injury [facility])  Patient Active Problem List   Diagnosis Date Noted  . Labile blood glucose   . Hyponatremia   . Coronary artery disease involving native coronary artery of native heart without angina pectoris   . Dysphagia   . Right-sided chest wall pain   . Controlled type 2 diabetes mellitus with hyperglycemia ([place])   . Acute blood loss anemia   . Acute on chronic combined systolic and diastolic congestive heart failure ([place])   . Thrombocytosis ([place])   . Leukocytosis   . Anoxic brain injury ([place]) 05/05/2019  . Pressure injury of skin 04/30/2019  . AKI (acute kidney injury) ([place])   . Acute respiratory failure ([place])   . Acute ST elevation myocardial infarction (STEMI) involving left anterior descending (LAD) coronary artery ([place]) 04/16/2019  . Cardiac arrest ([place]) 04/16/2019  . Acute respiratory failure with hypoxia and hypercarbia ([place]) 04/16/2019  . Cardiogenic shock ([place]) 04/16/2019  . VT (ventricular tachycardia) [facility])     Expected Discharge Date: Expected Discharge Date: 05/16/19  Team Members Present: Physician leading conference: Dr. Alger Simons Social Worker Present: Ovidio Kin, LCSW Nurse Present: Genene Churn, RN PT Present: Lavone Nian, PT OT Present: Laverle Hobby, OT SLP Present: Weston Anna, SLP PPS Coordinator present : Gunnar Fusi, SLP     Current Status/Progress Goal Weekly Team Focus  Bowel/Bladder   Continent of bowel and bladder  Remain continent of bowel and bladder with min assistance  Timed toileting, assist with  toileting needs every shift and prn.   Swallow/Nutrition/ Hydration   Regular/thin, Mod I  Mod I  Finish education today (final session) - d/c tomorrow   ADL's   (S) 200+ ft of functional mobility, increased endurance overall, improved grip strength, (S) ADLs overall  min A transfers, (S) ADLs overall  family education/training, cardiovascular endurance, ADL retraining   Mobility   supervision to CGA overall for transfers, gait, and stairs - surpassing LTGs set  (some upgraded)min assist basic and car transfers; S w/c x 100', min assist gait x 50' with LRAD, mod assist up/down 1 step wiht LRAD for home entry  finalize d/c planning and family education, higher level balance and gait, stairs, endurance, education for energy conservation   Communication             Safety/Cognition/ Behavioral Observations  Supervision-Min A semi-complex  Supervision  Surpassed goal level (basic tasks) so increased to semi-complex. Finish education today, semi-complex problem solving, d/c tomorrow.   Pain   Scheduled lidocaine patches to right chest wall for c/o pain-effective  Pain <2/10  Assess pain every shift and prn.   Skin   Ecchymosis to abdomen related to heparin injections  No further skin issues  Assess and address skin every shift and as needed      *See Care Plan and progress notes for long and short-term goals.     Barriers to Discharge  Current Status/Progress Possible Resolutions Date Resolved   Nursing  PT                    OT                  SLP                SW                Discharge Planning/Teaching Needs:  Home with wife who can provide 24 hr care. Family education will be completed today with wife and ready for DC tomorrow.  Family education being completed today   Team Discussion:  Progressing toward his goals of min assist level. Completing family education with wife today in preparation for discharge tomorrow. Will be followed by Cardiology as an  OP.  Medically stable for DC tomorrow.  Revisions to Treatment Plan:  DC 9/23    Medical Summary Current Status: wearing life vest. cognitively near baseline. strength and stamina better. Weekly Focus/Goal: finalize dc planning from a medical standpoint  Barriers to Discharge: Behavior;Medical stability   Possible Resolutions to Barriers: finalize medical plan for discharge   Continued Need for Acute Rehabilitation Level of Care: The patient requires daily medical management by a physician with specialized training in physical medicine and rehabilitation for the following reasons: Direction of a multidisciplinary physical rehabilitation program to maximize functional independence : Yes Medical management of patient stability for increased activity during participation in an intensive rehabilitation regime.: Yes Analysis of laboratory values and/or radiology reports with any subsequent need for medication adjustment and/or medical intervention. : Yes   I attest that I was present, lead the team conference, and concur with the assessment and plan of the team. teleconferene held due to [name], [place] Rhyme 05/15/2019, 10:25 AM

## 2019-05-15 NOTE — Progress Notes (Signed)
Speech Language Pathology Discharge Summary  Patient Details  Name: Lawrence Olson [name] [id number] Date of Birth: 1951-04-18  Today's Date: 05/15/2019 SLP Individual Time: 1300-1353 SLP Individual Time Calculation (min): 53 min   Skilled Therapeutic Interventions:  Pt was seen for skilled ST targeting cognition. SLP facilitated session with fluctuating Supervision - Min A verbal and visual cues for error awareness and correction during a complex calendar/monthly scheduling task. Pt also completed a novel semi-complex card game (blink) with Min faded to Supervision A for problem solving and use of external memory aid to recall instructions. Of note, pt exhibited poor frustration tolerance again today and required Max A encouragement and education regarding rationale for activities in order to participate. Pt was also distracted by reported pain/pressure from life vest and headache; RN made aware and assisted with remedies for situation. Pt's wife has been present for every ST session, thus education has been addressed throughout his stay, however SLP emphasized need for 24/7 supervision due to decreased safety awareness as well as need for assistance with complex cognitive tasks such as finance management and medication. Pt was left in bed with needs within reach and wife still present. Continue per current plan of care.     Patient has met 6 of 7 long term goals.  Patient to discharge at overall Supervision level.  Reasons goals not met: decreased emergent awareness, also poor frustration tolerance believed to impact his performance/willingness to fully participate last 2 sessions   Clinical Impression/Discharge Summary:   Pt made functional gains and met 6 out of 7 long term goals this admission. Pt currently requires Supervision assist for basic and semi-complex cognitive tasks. Pt is consuming regular diet with thin liquids, Mod I for use of swallow precautions/strategies. Pt has demonstrated  improved selective attention, basic and semi-complex problem solving, and use of compensatory memory strategies. Given that pt and wife report pt is at baseline level of cognitive functioning, and he is currently Mod I for swallowing , no follow up ST services are needed upon discharge. Pt and family education is complete at this time.   Care Partner:  Caregiver Able to Provide Assistance: Yes  Type of Caregiver Assistance: Cognitive  Recommendation:  24 hour supervision/assistance      Equipment: none   Reasons for discharge: Discharged from hospital   Patient/Family Agrees with Progress Made and Goals Achieved: Yes    Arbutus Leas 05/15/2019, 2:03 PM

## 2019-05-15 NOTE — Progress Notes (Signed)
Patient complaining of discomfort with his lifevest.  He says it is too tight.  Upon examination, vest fit snuggly but didn't appear restricting.  Educated patient and wife that for the lifevest to work properly, good contact with the skin is necessary.  Nevertheless, RN called [employer] representative to have someone stop by to assess the fit of the vest prior to his discharge.  Notified patient and family.  Brita Romp, RN

## 2019-05-15 NOTE — Plan of Care (Signed)
  Problem: RH PAIN MANAGEMENT Goal: RH STG PAIN MANAGED AT OR BELOW PT'S PAIN GOAL Description: Will manage pain w/ min assist  Outcome: Progressing   Problem: Consults Goal: RH GENERAL PATIENT EDUCATION Description: See Patient Education module for education specifics. Outcome: Completed/Met   Problem: RH BLADDER ELIMINATION Goal: RH STG MANAGE BLADDER WITH ASSISTANCE Description: STG Manage Bladder With mod Assistance Outcome: Completed/Met   Problem: RH SKIN INTEGRITY Goal: RH STG MAINTAIN SKIN INTEGRITY WITH ASSISTANCE Description: STG Maintain Skin Integrity With min Assistance. Outcome: Completed/Met

## 2019-05-16 LAB — GLUCOSE, CAPILLARY: Glucose-Capillary: 125 mg/dL — ABNORMAL HIGH (ref 70–99)

## 2019-05-16 LAB — BASIC METABOLIC PANEL
Anion gap: 8 (ref 5–15)
BUN: 57 mg/dL — ABNORMAL HIGH (ref 8–23)
CO2: 22 mmol/L (ref 22–32)
Calcium: 8.6 mg/dL — ABNORMAL LOW (ref 8.9–10.3)
Chloride: 102 mmol/L (ref 98–111)
Creatinine, Ser: 2.4 mg/dL — ABNORMAL HIGH (ref 0.61–1.24)
GFR calc Af Amer: 31 mL/min — ABNORMAL LOW (ref >60)
GFR calc non Af Amer: 27 mL/min — ABNORMAL LOW (ref >60)
Glucose, Bld: 166 mg/dL — ABNORMAL HIGH (ref 70–99)
Potassium: 4.6 mmol/L (ref 3.5–5.1)
Sodium: 132 mmol/L — ABNORMAL LOW (ref 135–145)

## 2019-05-16 MED ORDER — POLYETHYLENE GLYCOL 3350 17 G PO PACK: 17.0000 g | PACK | Freq: Every day | ORAL | 0 refills | Status: DC

## 2019-05-16 MED ORDER — HYDRALAZINE HCL 25 MG PO TABS: 12.5000 mg | ORAL_TABLET | Freq: Three times a day (TID) | ORAL | 0 refills | Status: DC

## 2019-05-16 MED ORDER — ISOSORBIDE MONONITRATE ER 30 MG PO TB24: 30.0000 mg | ORAL_TABLET | Freq: Every day | ORAL | 0 refills | Status: DC

## 2019-05-16 MED ORDER — QUETIAPINE FUMARATE 50 MG PO TABS: 50.0000 mg | ORAL_TABLET | Freq: Every day | ORAL | 0 refills | Status: DC

## 2019-05-16 MED ORDER — CARVEDILOL 3.125 MG PO TABS: 3.1250 mg | ORAL_TABLET | Freq: Two times a day (BID) | ORAL | 0 refills | Status: DC

## 2019-05-16 MED ORDER — FUROSEMIDE 40 MG PO TABS: 40.0000 mg | ORAL_TABLET | ORAL | 0 refills | Status: DC

## 2019-05-16 MED ORDER — INSULIN SYRINGES (DISPOSABLE) U-100 0.5 ML MISC: 1.0000 "application " | Freq: Two times a day (BID) | 0 refills | Status: DC

## 2019-05-16 MED ORDER — TAMSULOSIN HCL 0.4 MG PO CAPS: 0.4000 mg | ORAL_CAPSULE | Freq: Every day | ORAL | 0 refills | Status: DC

## 2019-05-16 MED ORDER — FAMOTIDINE 20 MG PO TABS: 20.0000 mg | ORAL_TABLET | Freq: Every day | ORAL | 0 refills | Status: DC

## 2019-05-16 MED ORDER — TRESIBA 100 UNIT/ML ~~LOC~~ SOLN: 15.0000 [IU] | Freq: Two times a day (BID) | SUBCUTANEOUS | 1 refills | Status: DC

## 2019-05-16 MED ORDER — LIDOCAINE 5 % EX PTCH: 1.0000 | MEDICATED_PATCH | CUTANEOUS | 0 refills | Status: DC

## 2019-05-16 MED ORDER — ATORVASTATIN CALCIUM 80 MG PO TABS: 80.0000 mg | ORAL_TABLET | Freq: Every day | ORAL | 0 refills | Status: DC

## 2019-05-16 NOTE — Discharge Summary (Signed)
Physician Discharge Summary  Patient ID: Lawrence Olson [name] [id number] DOB/AGE: 02/17/1951 68 y.o.  Admit date: 05/04/2019 Discharge date: 05/16/2019  Discharge Diagnoses:  Principal Problem:   Anoxic brain injury [facility]) Active Problems:   Right-sided chest wall pain   Controlled type 2 diabetes mellitus with hyperglycemia (HCC)   Acute blood loss anemia   Acute on chronic combined systolic and diastolic congestive heart failure (HCC)   Thrombocytosis (HCC)   Leukocytosis   Hyponatremia   Coronary artery disease involving native coronary artery of native heart without angina pectoris   Discharged Condition: stable   Significant Diagnostic Studies: N/A  Labs:  Basic Metabolic Panel: Recent Labs  Lab 05/10/19 0704 05/11/19 0600 05/12/19 0526 05/13/19 0551 05/14/19 1222 05/15/19 0443 05/16/19 0815  NA 136 134* 132* 134* 135 137 132*  K 5.1 4.4 4.6 4.5 4.8 4.6 4.6  CL 103 103 99 100 105 107 102  CO2 22 22 20* _0 GLUCOSE 113* 125* 143* 179* 121* 152* 166*  BUN 70* 66* 67* 58* 53* 51* 57*  CREATININE 2.41* 2.51* 2.39* 2.41* 2.39* 2.39* 2.40*  CALCIUM 8.9 8.6* 8.7* 8.7* 8.9 8.7* 8.6*  MG  --  1.9  --   --   --   --   --     CBC: CBC Latest Ref Rng & Units 05/14/2019 05/10/2019 05/07/2019  WBC 4.0 - 10.5 K/uL 16.7(H) 15.8(H) 19.4(H)  Hemoglobin 13.0 - 17.0 g/dL 9.2(L) 10.3(L) 9.9(L)  Hematocrit 39.0 - 52.0 % 27.9(L) 31.3(L) 32.0(L)  Platelets 150 - 400 K/uL 449(H) 658(H) 850(H)    CBG: Recent Labs  Lab 05/15/19 0647 05/15/19 1144 05/15/19 1630 05/15/19 2110 05/16/19 0621  GLUCAP 155* 123* 141* 153* 125*    Brief HPI:   Lawrence Olson is a 68 y.o. male in relatively good health who was admitted on 04/16/19 after witnessed cardiac arrest at work.  Question of onlooker CPR.  He was found to have V. tach and required CPR/ACLS protocol approximately 20 minutes.  He was intubated in ED and noted to be severely hypoxic and hypercapnic with ST elevation due to MI.   Despite prolonged downtime, he had some purposeful movement and was taken to Cath Lab fort emergent catheterization and attempts at opening at LAD unsuccessful. He developed VT and Impella placed with pressors added for cardiogenic shock.    Hospital course significant for question of seizures with EEG revealing diffuse encephalopathy and MRI punctate infarct in the caudate right head with question of hypoxic insult in frontal/insular region.  Stroke felt to be due to small vessel disease versus embolic in nature and ASA recommended.  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  He tolerated extubation by 09/08 and follow-up echo showed EF of 25 to 30% therefore LifeVest recommended.  He was started on dysphagia 2 diet with thin liquids.  He was noted to have significant debility with cognitive deficits and CIR recommended due to functional decline   Hospital Course: Lawrence Olson was admitted to rehab 05/04/2019 for inpatient therapies to consist of PT, ST and OT at least three hours five days a week. Past admission physiatrist, therapy team and rehab RN have worked together to provide customized collaborative inpatient rehab. His activity tolerance and mentation have steadily improved. Steroids have been weaned off.   ABLA is stable and leucocytosis is slowly resolving without  fevers or signs of infection. Diabetes has been monitored with ac/hs CBG checks and SSI was use prn for tighter BS control. Megace was added briefly to help improve appetite. As diet was advance to regular textures with thins,  intake has improved and megace was discontinued. Levemir has been titrated upwards for tighter BS control and patient advised to continue monitoring CBG on ac/hs basis and to follow up with PCP for further adjustment. Nephrology signed off as renal status has  steadily improved.  Acute on chronic systolic CHF has been compensated and respiratory status has been stable. Cardiology has been following for management of cardiac issues and hydralazine and Imdur was added on 09/21. Weights were monitored daily and noted to be up by 10 lbs therefore Lasix was added MWF on 9/22.   Blood pressures have been stable and he is tolerating this without SE. Serial BMET showed recurrent mild hyponatremia and SCr has plateaued to 2.4. As mentation and strength improved, he was fitted with Life Vest on 9/21 after completing education with family. Sleep wake cycle is improving and Seroquel has been weaned down to 50 mg at bedtime. Chest wall pain is improving with addition of lidocaine patch. He is tolerating it better once changed out to larger size. He is continent of bowel and bladder. He has made gains during rehab stay and is currently at supervision level. He will continue to receive follow up [place], [place], [place] and [place] by [facility] after discharge  Rehab course: During patient's stay in rehab weekly team conferences were held to monitor patient's progress, set goals and discuss barriers to discharge. At admission, patient required max assist with ADL tasks and with mobility. He exhibited mild dysphagia with moderate to severe cognitive deficits requiring mod to max assist to complete basic tasks. He has had improvement in activity tolerance, balance, postural control as well as ability to compensate for deficits. He is able to complete ADL tasks with supervision and with mobility. He requires supervision with min verbal cues to correct for errors and to complete semi complex tasks. He continues to have poor frustration with tendency to be easily distracted and has poor awareness. Family was educated regarding all aspects of safety, care as well as cognitive assistance with higher level tasks.    Disposition: Home  Diet: Heart healthy/Carb Modified.   Special  Instructions: 1. Monitor blood sugars before meals and at bedtime.  2. Monitor weight daily. 3. No driving for 6 months or till cleared by cardiology.   Discharge Instructions    Ambulatory referral to Physical Medicine Rehab   Complete by: As directed    1-2 weeks transitional care appt     Allergies as of 05/16/2019   No Known Allergies     Medication List    STOP taking these medications   chlorhexidine 0.12 % solution Commonly known as: PERIDEX   feeding supplement (ENSURE ENLIVE) Liqd   heparin 1000 unit/mL Soln injection   heparin 5000 UNIT/ML injection   hydrALAZINE 20 MG/ML injection Commonly known as: APRESOLINE Replaced by: hydrALAZINE 25 MG tablet   insulin aspart 100 UNIT/ML injection Commonly known as: novoLOG   insulin detemir 100 UNIT/ML injection Commonly known as: LEVEMIR   levalbuterol 0.63 MG/3ML nebulizer solution Commonly known as: XOPENEX   ondansetron 4 MG/2ML Soln injection Commonly known as: ZOFRAN   predniSONE 20 MG tablet Commonly known as: DELTASONE     TAKE these medications   acetaminophen 325 MG tablet Commonly known  as: TYLENOL Take 1-2 tablets (325-650 mg total) by mouth every 4 (four) hours as needed for mild pain.   aspirin 81 MG chewable tablet Chew 1 tablet (81 mg total) by mouth daily.   atorvastatin 80 MG tablet Commonly known as: LIPITOR Take 1 tablet (80 mg total) by mouth daily at 6 PM.   B-complex with vitamin C tablet Take 1 tablet by mouth daily.   blood glucose meter kit and supplies Kit Dispense based on patient and insurance preference. Use up to four times daily as directed. (FOR ICD-9 250.00, 250.01).   carvedilol 3.125 MG tablet Commonly known as: COREG Take 1 tablet (3.125 mg total) by mouth 2 (two) times daily with a meal.   famotidine 20 MG tablet Commonly known as: PEPCID Take 1 tablet (20 mg total) by mouth daily.   furosemide 40 MG tablet Commonly known as: LASIX Take 1 tablet (40 mg  total) by mouth every Monday, Wednesday, and Friday.   hydrALAZINE 25 MG tablet Commonly known as: APRESOLINE Take 0.5 tablets (12.5 mg total) by mouth every 8 (eight) hours. Replaces: hydrALAZINE 20 MG/ML injection   Insulin Syringes (Disposable) U-100 0.5 ML Misc 1 application by Does not apply route 2 (two) times daily.   isosorbide mononitrate 30 MG 24 hr tablet Commonly known as: IMDUR Take 1 tablet (30 mg total) by mouth daily.   lidocaine 5 % Commonly known as: LIDODERM Place 1 patch onto the skin daily. To right chest wall. Notes to patient: Insurance usually does not cover this. You can purchase this over the counter.    polyethylene glycol 17 g packet Commonly known as: MIRALAX / GLYCOLAX Take 17 g by mouth daily.   QUEtiapine 50 MG tablet Commonly known as: SEROQUEL Take 1 tablet (50 mg total) by mouth at bedtime. What changed: when to take this Notes to patient: To help with sleep and anxiety   tamsulosin 0.4 MG Caps capsule Commonly known as: FLOMAX Take 1 capsule (0.4 mg total) by mouth daily after supper.   Tresiba 100 UNIT/ML Soln Generic drug: Insulin Degludec Inject 15 Units into the skin 2 (two) times daily.      Follow-up Information    [facility] Follow up.   Specialty: Physical Medicine and Rehabilitation Why: Office will call you with follow up appointment Contact information: [address], [place] [address] [phone]       Bensimhon, Shaune Pascal, MD Follow up on 05/30/2019.   Specialty: Cardiology Why: 9:40 [place] [address]  Contact information: [address] [place] [address] [place]. Call on 05/17/2019.   Why: to make post hospital follow up in 1-2 weeks.  Contact information: [place] [address] [phone]          Signed: Bary Leriche 05/17/2019,  9:56 PM

## 2019-05-16 NOTE — Plan of Care (Signed)
  Problem: RH SAFETY Goal: RH STG ADHERE TO SAFETY PRECAUTIONS W/ASSISTANCE/DEVICE Description: STG Adhere to Safety Precautions With Assistance/Device. Outcome: Completed/Met Goal: RH STG DECREASED RISK OF FALL WITH ASSISTANCE Description: STG Decreased Risk of Fall With Assistance. Adhere to safety regulations Outcome: Completed/Met   Problem: RH PAIN MANAGEMENT Goal: RH STG PAIN MANAGED AT OR BELOW PT'S PAIN GOAL Description: Will manage pain w/ min assist  Outcome: Completed/Met   Problem: RH KNOWLEDGE DEFICIT GENERAL Goal: RH STG INCREASE KNOWLEDGE OF SELF CARE AFTER HOSPITALIZATION Outcome: Completed/Met   Problem: Consults Goal: RH BRAIN INJURY PATIENT EDUCATION Description: Description: See Patient Education module for eduction specifics Outcome: Completed/Met

## 2019-05-16 NOTE — Progress Notes (Signed)
Patient was discharged from 4M11.  Patient left floor via wheelchair escorted by nursing staff.  Patient verbalized understanding of discharge instructions as given by  Algis Liming, PA.  All patient belongings sent with patient including DME and prescriptions.  Patient appears to be in no immediate distress at this time.    Brita Romp, RN

## 2019-05-16 NOTE — Discharge Instructions (Signed)
Inpatient Rehab Discharge Instructions  [place] Discharge date and time:  05/16/19  Activities/Precautions/ Functional Status: Activity: no lifting, driving, or strenuous exercise till cleared by MD. Per [place] law you cannot drive for 6 months or till cleared by MD.  Diet: cardiac diet and diabetic diet Wound Care: none needed   Functional status:  ___ No restrictions     ___ Walk up steps independently _X__ 24/7 supervision/assistance   ___ Walk up steps with assistance ___ Intermittent supervision/assistance  ___ Bathe/dress independently ___ Walk with walker     ___ Bathe/dress with assistance ___ Walk Independently    ___ Shower independently ___ Walk with assistance    _X__ Shower with assistance _X__ No alcohol     ___ Return to work/school ________   Home Health:   PT     OT     ST   RN                                  Agency: [facility]   Phone: [phone]   Medical Equipment/Items Ordered:  Rolling walker, tub bench                                                      Agency/Supplier:  [facility] @ [phone]   Special Instructions: 1. Monitor blood sugars before meals and at bedtime--document and take results with you for MD visit. 2. Weight yourself today and daily.  Contact cardiology if you develop shortness of breath, swelling or weight goes up (as below)  Heart Failure, Diagnosis  Heart failure is a condition in which the heart has trouble pumping blood because it has become weak or stiff. This means that the heart does not pump blood well enough for the body to stay healthy. For some people with heart failure, fluid may back up into the lungs. There may also be swelling (edema) in the lower legs. Heart failure is usually a long-term (chronic) condition. It is important for you to take good care of yourself and follow the treatment plan from your health care provider. What are the causes? This condition may be caused by:  High blood pressure  (hypertension). Hypertension causes the heart muscle to work harder than normal. This makes the heart stiff or weak.  Coronary artery disease, or CAD. CAD is the buildup of cholesterol and fat (plaque) in the arteries of the heart.  Heart attack, also called myocardial infarction. This injures the heart muscle, making it hard for the heart to pump blood.  Abnormal heart valves. The valves do not open and close properly, forcing the heart to pump harder to keep the blood flowing.  Heart muscle disease (cardiomyopathy or myocarditis). This is damage to the heart muscle. It can increase the risk of heart failure.  Lung disease. The heart works harder when the lungs are not healthy.  Abnormal heart rhythms. These can lead to heart failure. What increases the risk? The risk of heart failure increases as a person ages. This condition is also more likely to develop in people who:  Are overweight.  Are male.  Smoke or chew tobacco.  Abuse alcohol or illegal drugs.  Have taken medicines that can damage the heart, such as chemotherapy drugs.  Have  diabetes.  Have abnormal heart rhythms.  Have thyroid problems.  Have low blood counts (anemia). What are the signs or symptoms? Symptoms of this condition include:  Shortness of breath with activity, such as when climbing stairs.  A cough that does not go away.  Swelling of the feet, ankles, legs, or abdomen.  Losing weight for no reason.  Trouble breathing when lying flat (orthopnea).  Waking from sleep because of the need to sit up and get more air.  Rapid heartbeat.  Tiredness (fatigue) and loss of energy.  Feeling light-headed, dizzy, or close to fainting.  Loss of appetite.  Nausea.  Waking up more often during the night to urinate (nocturia).  Confusion. How is this diagnosed? This condition is diagnosed based on:  Your medical history, symptoms, and a physical exam.  Diagnostic tests, which may  include: ? Echocardiogram. ? Electrocardiogram (ECG). ? Chest X-ray. ? Blood tests. ? Exercise stress test. ? Radionuclide scans. ? Cardiac catheterization and angiogram. How is this treated? Treatment for this condition is aimed at managing the symptoms of heart failure. Medicines Treatment may include medicines that:  Help lower blood pressure by relaxing (dilating) the blood vessels. These medicines are called ACE inhibitors (angiotensin-converting enzyme) and ARBs (angiotensin receptor blockers).  Cause the kidneys to remove salt and water from the blood through urination (diuretics).  Improve heart muscle strength and prevent the heart from beating too fast (beta blockers).  Increase the force of the heartbeat (digoxin). Healthy behavior changes     Treatment may also include making healthy lifestyle changes, such as:  Reaching and staying at a healthy weight.  Quitting smoking or chewing tobacco.  Eating heart-healthy foods.  Limiting or avoiding alcohol.  Stopping the use of illegal drugs.  Being physically active.  Other treatments Other treatments may include:  Procedures to open blocked arteries or repair damaged valves.  Placing a pacemaker to improve heart function (cardiac resynchronization therapy).  Placing a device to treat serious abnormal heart rhythms (implantable cardioverter defibrillator, or ICD).  Placing a device to improve the pumping ability of the heart (left ventricular assist device, or LVAD).  Receiving a healthy heart from a donor (heart transplant). This is done when other treatments have not helped. Follow these instructions at home:  Manage other health conditions as told by your health care provider. These may include hypertension, diabetes, thyroid disease, or abnormal heart rhythms.  Get ongoing education and support as needed. Learn as much as you can about heart failure.  Keep all follow-up visits as told by your health  care provider. This is important. Summary  Heart failure is a condition in which the heart has trouble pumping blood because it has become weak or stiff.  This condition is caused by high blood pressure and other diseases of the heart and lungs.  Symptoms of this condition include shortness of breath, tiredness (fatigue), nausea, and swelling of the feet, ankles, legs, or abdomen.  Treatments for this condition may include medicines, lifestyle changes, and surgery.  Manage other health conditions as told by your health care provider. This information is not intended to replace advice given to you by your health care provider. Make sure you discuss any questions you have with your health care provider. Document Released: 08/09/2005 Document Revised: 10/27/2018 Document Reviewed: 10/27/2018 Elsevier Patient Education  [place]:         My questions have been answered and I understand these instructions.  I will adhere to these goals and the provided educational materials after my discharge from the hospital.  Patient/Caregiver Signature _______________________________ Date __________  Clinician Signature _______________________________________ Date __________  Please bring this form and your medication list with you to all your follow-up doctor's appointments.

## 2019-05-18 ENCOUNTER — Telehealth: Payer: Self-pay

## 2019-05-18 NOTE — Telephone Encounter (Signed)
[facility]  Patient Name: Lawrence Olson DOB: [birth year] Appointment Date and Time: 05-25-2019 / 1:20pm With:  Zella Ball first then Dr Posey Pronto.  Questions for our staff to ask patients on Transitional care 48 hour phone call:   1. Are you/is patient experiencing any problems since coming home? NO  Are there any questions regarding any aspect of care? NO  2. Are there any questions regarding medications administration/dosing? NO  Are meds being taken as prescribed? YES  Patient should review meds with caller to confirm   3. Have there been any falls? NO  4. Has Home Health been to the house and/or have they contacted you? YES  If not, have you tried to contact them? NO  Can we help you contact them? NO  5. Are bowels and bladder emptying properly? NO  Are there any unexpected incontinence issues? NO  If applicable, is patient following bowel/bladder programs? NO  6. Any fevers, problems with breathing, unexpected pain? NO  7. Are there any skin problems or new areas of breakdown? NO  8. Has the patient/family member arranged specialty MD follow up (ie cardiology/neurology/renal/surgical/etc)? NO Can we help arrange? NO  9. Does the patient need any other services or support that we can help arrange? NO  10. Are caregivers following through as expected in assisting the patient? YES  11. Has the patient quit smoking, drinking alcohol, or using drugs as recommended? NA  [facility] [phone]

## 2019-05-25 ENCOUNTER — Encounter: Payer: PPO | Attending: Registered Nurse | Admitting: Registered Nurse

## 2019-05-25 ENCOUNTER — Other Ambulatory Visit: Payer: Self-pay

## 2019-05-25 VITALS — BP 112/71 | HR 79 | Temp 97.7°F | Resp 14 | Ht 67.0 in | Wt 176.0 lb

## 2019-05-25 DIAGNOSIS — I5043 Acute on chronic combined systolic (congestive) and diastolic (congestive) heart failure: Secondary | ICD-10-CM | POA: Insufficient documentation

## 2019-05-25 DIAGNOSIS — I2510 Atherosclerotic heart disease of native coronary artery without angina pectoris: Secondary | ICD-10-CM | POA: Insufficient documentation

## 2019-05-25 DIAGNOSIS — G931 Anoxic brain damage, not elsewhere classified: Secondary | ICD-10-CM | POA: Insufficient documentation

## 2019-05-25 DIAGNOSIS — R0789 Other chest pain: Secondary | ICD-10-CM | POA: Diagnosis not present

## 2019-05-25 DIAGNOSIS — E1165 Type 2 diabetes mellitus with hyperglycemia: Secondary | ICD-10-CM | POA: Insufficient documentation

## 2019-05-25 NOTE — Progress Notes (Signed)
Subjective:    Patient ID: Lawrence Olson, male    DOB: 20-Apr-1951, 68 y.o.   [name] [id number]  HPI: Lawrence Olson is a 68 y.o. 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 Lawrence Olson was  Brought to [facility] via EMS post cardiac arrest and was found to have anterior ST elevation MT and admitted on 04/16/2019. He went for emergent cardiac catheterization it was unsuccessful, Impella was placed in cath lab and he was transported to CCU. Lawrence Olson had complicated hospital course see Dr. Haroldine Laws and Reesa Chew PA discharge summary for further details.   He was admitted to Inpatient rehabilitation on 05/04/2019 and discharged home on 05/16/2019. He decline outpatient therapy with [facility]. He denies pain. He rated his pain 0. Reports he has a good appetite.   Daughter in room all questions answered.     Pain Inventory Average Pain 0 Pain Right Now 0 My pain is no pain  In the last 24 hours, has pain interfered with the following? General activity 0 Relation with others 0 Enjoyment of life 0 What TIME of day is your pain at its worst? no pain Sleep (in general) Fair  Pain is worse with: no pain Pain improves with: no pain Relief from Meds: no pain  Mobility walk without assistance how many minutes can you walk? 15-20 ability to climb steps?  yes do you drive?  no  Function not employed: date last employed .  Neuro/Psych No problems in this area  Prior Studies new visit  Physicians involved in your care new visit   Family History  Problem Relation Age of Onset  . Healthy Brother   . Healthy Sister    Social History   Socioeconomic History  . Marital status: Married    Spouse name: Not on file  . Number of children: Not on file  . Years of education: Not on file  . Highest education level: Not on file  Occupational  History  . Not on file  Social Needs  . Financial resource strain: Not on file  . Food insecurity    Worry: Not on file    Inability: Not on file  . Transportation needs    Medical: Not on file    Non-medical: Not on file  Tobacco Use  . Smoking status: Former Research scientist (life sciences)  . Smokeless tobacco: Never Used  Substance and Sexual Activity  . Alcohol use: Yes    Comment: rarely  . Drug use: Not on file  . Sexual activity: Not on file  Lifestyle  . Physical activity    Days per week: Not on file    Minutes per session: Not on file  . Stress: Not on file  Relationships  . Social Herbalist on phone: Not on file    Gets together: Not on file    Attends religious service: Not on file    Active member of club or organization: Not on file    Attends meetings of clubs or organizations: Not on file    Relationship status: Not on file  Other Topics Concern  . Not on file  Social History Narrative  . Not on file   Past Surgical History:  Procedure Laterality Date  . RIGHT HEART CATH AND CORONARY ANGIOGRAPHY N/A 04/16/2019   Procedure: RIGHT HEART CATH AND CORONARY  ANGIOGRAPHY;  Surgeon: Troy Sine, MD;  Location: [facility];  Service: Cardiovascular;  Laterality: N/A;  . VENTRICULAR ASSIST DEVICE INSERTION N/A 04/16/2019   Procedure: VENTRICULAR ASSIST DEVICE INSERTION;  Surgeon: Troy Sine, MD;  Location: [facility];  Service: Cardiovascular;  Laterality: N/A;   Past Medical History:  Diagnosis Date  . Jaundice    needed hospitalization in [place]   BP 112/71   Pulse 79   Temp 97.7 F (36.5 C) (Skin)   Resp 14   Ht 5\' 7"  (1.702 m)   Wt 176 lb (79.8 kg)   SpO2 96%   BMI 27.57 kg/m   Opioid Risk Score:   Fall Risk Score:  `1  Depression screen PHQ 2/9  Depression screen [facility] 2/9 01/21/2017 12/08/2015  Decreased Interest 0 0  Down, Depressed, Hopeless 0 0  PHQ - 2 Score 0 0    Review of Systems  Constitutional: Negative.   HENT: Negative.    Eyes: Negative.   Respiratory: Negative.   Cardiovascular: Negative.   Gastrointestinal: Negative.   Endocrine: Negative.   Genitourinary: Positive for frequency.  Musculoskeletal: Negative.   Skin: Negative.   Neurological: Negative.   Hematological: Negative.   Psychiatric/Behavioral: Negative.   All other systems reviewed and are negative.      Objective:   Physical Exam Vitals signs and nursing note reviewed.  Constitutional:      Appearance: Normal appearance.  Neck:     Musculoskeletal: Normal range of motion and neck supple.  Cardiovascular:     Rate and Rhythm: Normal rate and regular rhythm.     Pulses: Normal pulses.     Heart sounds: Normal heart sounds.  Pulmonary:     Effort: Pulmonary effort is normal.     Breath sounds: Normal breath sounds.  Musculoskeletal:     Comments: Normal Muscle Bulk and Muscle Testing Reveals:  Upper Extremities: Full ROM and Muscle Strength on the Right 4/5 and Left  5/5  Lower Extremities: Full ROM and Muscle Strength 5/5 Arises from Table with ease Narrow Based Gait   Skin:    General: Skin is warm and dry.  Neurological:     Mental Status: He is alert and oriented to person, place, and time.  Psychiatric:        Mood and Affect: Mood normal.        Behavior: Behavior normal.           Assessment & Plan:  1. Anoxic brain injury: Refused Outpatient Therapy. Continue HEP as Tolerated. Has a scheduled appointment with Neurology.  2.Right- sided chest wall pain: Denies Pain at this time. Cardiology [place]. 3. Acute on chronic combined systolic and diastolic CHF: Cardiology Following.  4. CAD.Cardiology Following.   20 minutes of face to face patient care time was spent during this visit. All questions were encouraged and answered.  F/U with Dr. Posey Pronto in 4-6 weeks

## 2019-05-28 ENCOUNTER — Encounter: Payer: Self-pay | Admitting: Registered Nurse

## 2019-05-30 ENCOUNTER — Ambulatory Visit (HOSPITAL_COMMUNITY)
Admission: RE | Admit: 2019-05-30 | Discharge: 2019-05-30 | Disposition: A | Discharge status: Home / Self Care | Payer: PPO | Source: Ambulatory Visit | Attending: Internal Medicine | Admitting: Internal Medicine

## 2019-05-30 ENCOUNTER — Other Ambulatory Visit: Payer: Self-pay

## 2019-05-30 ENCOUNTER — Encounter (HOSPITAL_COMMUNITY): Payer: Self-pay | Admitting: Internal Medicine

## 2019-05-30 VITALS — BP 116/64 | HR 72 | Wt 174.6 lb

## 2019-05-30 DIAGNOSIS — I252 Old myocardial infarction: Secondary | ICD-10-CM | POA: Diagnosis not present

## 2019-05-30 DIAGNOSIS — I5042 Chronic combined systolic (congestive) and diastolic (congestive) heart failure: Secondary | ICD-10-CM

## 2019-05-30 DIAGNOSIS — E1122 Type 2 diabetes mellitus with diabetic chronic kidney disease: Secondary | ICD-10-CM | POA: Diagnosis not present

## 2019-05-30 DIAGNOSIS — Z8674 Personal history of sudden cardiac arrest: Secondary | ICD-10-CM | POA: Insufficient documentation

## 2019-05-30 DIAGNOSIS — Z7982 Long term (current) use of aspirin: Secondary | ICD-10-CM | POA: Diagnosis not present

## 2019-05-30 DIAGNOSIS — I2510 Atherosclerotic heart disease of native coronary artery without angina pectoris: Secondary | ICD-10-CM | POA: Diagnosis not present

## 2019-05-30 DIAGNOSIS — Z794 Long term (current) use of insulin: Secondary | ICD-10-CM | POA: Diagnosis not present

## 2019-05-30 DIAGNOSIS — I5021 Acute systolic (congestive) heart failure: Secondary | ICD-10-CM | POA: Diagnosis not present

## 2019-05-30 DIAGNOSIS — Z79899 Other long term (current) drug therapy: Secondary | ICD-10-CM | POA: Insufficient documentation

## 2019-05-30 DIAGNOSIS — N184 Chronic kidney disease, stage 4 (severe): Secondary | ICD-10-CM | POA: Insufficient documentation

## 2019-05-30 DIAGNOSIS — I255 Ischemic cardiomyopathy: Secondary | ICD-10-CM | POA: Insufficient documentation

## 2019-05-30 DIAGNOSIS — Z87891 Personal history of nicotine dependence: Secondary | ICD-10-CM | POA: Insufficient documentation

## 2019-05-30 LAB — CBC
HCT: 30.6 % — ABNORMAL LOW (ref 39.0–52.0)
Hemoglobin: 9.7 g/dL — ABNORMAL LOW (ref 13.0–17.0)
MCH: 31.1 pg (ref 26.0–34.0)
MCHC: 31.7 g/dL (ref 30.0–36.0)
MCV: 98.1 fL (ref 80.0–100.0)
Platelets: 408 10*3/uL — ABNORMAL HIGH (ref 150–400)
RBC: 3.12 MIL/uL — ABNORMAL LOW (ref 4.22–5.81)
RDW: 15.9 % — ABNORMAL HIGH (ref 11.5–15.5)
WBC: 9.1 10*3/uL (ref 4.0–10.5)
nRBC: 0 % (ref 0.0–0.2)

## 2019-05-30 LAB — BASIC METABOLIC PANEL
Anion gap: 13 (ref 5–15)
BUN: 41 mg/dL — ABNORMAL HIGH (ref 8–23)
CO2: 21 mmol/L — ABNORMAL LOW (ref 22–32)
Calcium: 9.3 mg/dL (ref 8.9–10.3)
Chloride: 103 mmol/L (ref 98–111)
Creatinine, Ser: 1.66 mg/dL — ABNORMAL HIGH (ref 0.61–1.24)
GFR calc Af Amer: 48 mL/min — ABNORMAL LOW (ref >60)
GFR calc non Af Amer: 42 mL/min — ABNORMAL LOW (ref >60)
Glucose, Bld: 113 mg/dL — ABNORMAL HIGH (ref 70–99)
Potassium: 3.8 mmol/L (ref 3.5–5.1)
Sodium: 137 mmol/L (ref 135–145)

## 2019-05-30 LAB — BRAIN NATRIURETIC PEPTIDE: B Natriuretic Peptide: 372.5 pg/mL — ABNORMAL HIGH (ref 0.0–100.0)

## 2019-05-30 MED ORDER — FUROSEMIDE 40 MG PO TABS: 40.0000 mg | ORAL_TABLET | ORAL | 6 refills | Status: DC

## 2019-05-30 MED ORDER — CARVEDILOL 6.25 MG PO TABS: 6.2500 mg | ORAL_TABLET | Freq: Two times a day (BID) | ORAL | 6 refills | Status: DC

## 2019-05-30 MED ORDER — CARVEDILOL 6.25 MG PO TABS: 6.2500 mg | ORAL_TABLET | Freq: Two times a day (BID) | ORAL | 3 refills | Status: DC

## 2019-05-30 MED ORDER — HYDRALAZINE HCL 25 MG PO TABS: 25.0000 mg | ORAL_TABLET | Freq: Three times a day (TID) | ORAL | 6 refills | Status: DC

## 2019-05-30 MED ORDER — HYDRALAZINE HCL 25 MG PO TABS: 25.0000 mg | ORAL_TABLET | Freq: Three times a day (TID) | ORAL | 3 refills | Status: DC

## 2019-05-30 NOTE — Progress Notes (Unsigned)
ReDS Vest - 05/30/19 1000      ReDS Vest   MR   Moderate    Estimated volume prior to reading  Med    Fitting Posture  Sitting    Height Marker  Short    Ruler Value  [place] C

## 2019-05-30 NOTE — Progress Notes (Addendum)
ADVANCED HF CLINIC NOTE    HF Cardiologist: Bensimhon  HPI:  Lawrence Olson is a 68 year old male from [place] with h/o CAD, systolic HF and recent cardiac arrest.   He had no known medical problems until 04/16/19 when he experienced witnessed VF arrest at work. Had bystander CPR x 20 mins with defib and intubation in field. He was taken to cath lab for emergent catheterization. Attempts at opening LAD unsuccessful (felt to be chronic). Impella placed and he was taken to CCU. EF 25-30%. Had protracted course with respiratory failure, renal failure (requiring CVVHD) and anoxic brain injury. There was also a question of acute amio pneumonitis with ESR> 100.   Extubated 05/01/19. Mental status and renal function improved. Discharged to CIR. Diw well in CR with dramatic improvement. Leifevest placed. Creatinine plateaued at 2.4 so ACE/ARB/ARNI not started.   Here with his wife. Feeling very good. Walking 10-15 minutes every day. Mild SOB/chest tightness. No edema, orthopnea or PND. Wearing lifevest without problem No dizziness. Appetite good. BP at home 120-130   ROS: All systems negative except as listed in HPI, PMH and Problem List.  SH:  Social History   Socioeconomic History  . Marital status: Married    Spouse name: Not on file  . Number of children: Not on file  . Years of education: Not on file  . Highest education level: Not on file  Occupational History  . Not on file  Social Needs  . Financial resource strain: Not on file  . Food insecurity    Worry: Not on file    Inability: Not on file  . Transportation needs    Medical: Not on file    Non-medical: Not on file  Tobacco Use  . Smoking status: Former Research scientist (life sciences)  . Smokeless tobacco: Never Used  Substance and Sexual Activity  . Alcohol use: Yes    Comment: rarely  . Drug use: Not on file  . Sexual activity: Not on file  Lifestyle  . Physical activity    Days per week: Not on file    Minutes per session: Not on file  .  Stress: Not on file  Relationships  . Social Herbalist on phone: Not on file    Gets together: Not on file    Attends religious service: Not on file    Active member of club or organization: Not on file    Attends meetings of clubs or organizations: Not on file    Relationship status: Not on file  . Intimate partner violence    Fear of current or ex partner: Not on file    Emotionally abused: Not on file    Physically abused: Not on file    Forced sexual activity: Not on file  Other Topics Concern  . Not on file  Social History Narrative  . Not on file    FH:  Family History  Problem Relation Age of Onset  . Healthy Brother   . Healthy Sister     Past Medical History:  Diagnosis Date  . Jaundice    needed hospitalization in [place]    Current Outpatient Medications  Medication Sig Dispense Refill  . acetaminophen (TYLENOL) 325 MG tablet Take 1-2 tablets (325-650 mg total) by mouth every 4 (four) hours as needed for mild pain.    Marland Kitchen aspirin 81 MG chewable tablet Chew 1 tablet (81 mg total) by mouth daily.    Marland Kitchen atorvastatin (LIPITOR) 80 MG tablet  Take 1 tablet (80 mg total) by mouth daily at 6 PM. 30 tablet 0  . B Complex-C (B-COMPLEX WITH VITAMIN C) tablet Take 1 tablet by mouth daily.    . blood glucose meter kit and supplies KIT Dispense based on patient and insurance preference. Use up to four times daily as directed. (FOR ICD-9 250.00, 250.01). 1 each 0  . carvedilol (COREG) 3.125 MG tablet Take 1 tablet (3.125 mg total) by mouth 2 (two) times daily with a meal. 60 tablet 0  . famotidine (PEPCID) 20 MG tablet Take 1 tablet (20 mg total) by mouth daily. 30 tablet 0  . furosemide (LASIX) 40 MG tablet Take 1 tablet (40 mg total) by mouth every Monday, Wednesday, and Friday. 15 tablet 0  . hydrALAZINE (APRESOLINE) 25 MG tablet Take 0.5 tablets (12.5 mg total) by mouth every 8 (eight) hours. 60 tablet 0  . Insulin Degludec (TRESIBA) 100 UNIT/ML SOLN Inject 15 Units  into the skin 2 (two) times daily. 50 mL 1  . Insulin Syringes, Disposable, U-100 0.5 ML MISC 1 application by Does not apply route 2 (two) times daily. 100 each 0  . isosorbide mononitrate (IMDUR) 30 MG 24 hr tablet Take 1 tablet (30 mg total) by mouth daily. 30 tablet 0  . tamsulosin (FLOMAX) 0.4 MG CAPS capsule Take 1 capsule (0.4 mg total) by mouth daily after supper. 30 capsule 0   No current facility-administered medications for this encounter.     Vitals:   05/30/19 0943  BP: 116/64  Pulse: 72  SpO2: 100%  Weight: 79.2 kg (174 lb 9.6 oz)    PHYSICAL EXAM:  General:  Well appearing. No resp difficulty HEENT: normal Neck: supple. JVP flat. Carotids 2+ bilaterally; no bruits. No lymphadenopathy or thryomegaly appreciated. Cor: PMI normal. Regular rate & rhythm. No rubs, gallops or murmurs. Lungs: clear Abdomen: soft, nontender, nondistended. No hepatosplenomegaly. No bruits or masses. Good bowel sounds. Extremities: no cyanosis, clubbing, rash, edema Neuro: alert & orientedx3, cranial nerves grossly intact. Moves all 4 extremities w/o difficulty. Affect pleasant.     ASSESSMENT & PLAN:  1. Acute systolic HF - Echo 3/73 EF 25-30% in setting of cardiac arrest and severe iCM - Much improved - NYHA II - Volume status mildly elevated. ReDS 41% - Increase lasix to 70m M-F - Increase carvedilol to 6.25 bid - Increase hydralazine to 25 tid - Continue Imdur 30 daily - Check labs today. If creatinine improving will consider Entresto. And SGLT2i - PharmD visit in 2 weeks to review - Continue Lifevest  2. CAD s/p recent anterior MI  - treated medically as unable to open LAD CTO - mild chest tightness with activity but stable - Continue ASA/statin/imdur - Will reviewe films with interventional team but with Cr 2.4 will continue to treat medically for now  3. H/o VF arrest - continue Lifevest - echo 3 months - f/u with EP - Will need eventual ICD  4. 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 Recheck today. If creatinine < 2.0 would start Entresto 24/26 bid with close f/u  - Has f/u Nephrology   Total time spent 35 minutes. Over half that time spent discussing above.    DGlori Bickers MD  11:00 AM

## 2019-05-30 NOTE — Patient Instructions (Addendum)
INCREASE Coreg to 6.25mg  (1 tab) twice a day  INCREASE Hydralazine to 25mg  (1 tab) three times a day  Take Furosemide (Lasix) 40mg  (1 tab) Every day EXCEPT for Saturday and Sunday.   Labs today We will only contact you if something comes back abnormal or we need to make some changes. Otherwise no news is good news!   Your physician recommends that you schedule a follow-up appointment in: 2 weeks with the Pharmacist   Your physician recommends that you schedule a follow-up appointment in: 2 months with Dr Haroldine Laws  At the [facility], you and your health needs are our priority. As part of our continuing mission to provide you with exceptional heart care, we have created designated Provider Care Teams. These Care Teams include your primary Cardiologist (physician) and Advanced Practice Providers (APPs- Physician Assistants and Nurse Practitioners) who all work together to provide you with the care you need, when you need it.   You may see any of the following providers on your designated Care Team at your next follow up: Marland Kitchen Dr Glori Bickers . Dr Loralie Champagne . Darrick Grinder, NP   Please be sure to bring in all your medications bottles to every appointment.

## 2019-05-31 ENCOUNTER — Telehealth (HOSPITAL_COMMUNITY): Payer: Self-pay

## 2019-05-31 MED ORDER — SACUBITRIL-VALSARTAN 24-26 MG PO TABS: 1.0000 | ORAL_TABLET | Freq: Two times a day (BID) | ORAL | 6 refills | Status: DC

## 2019-05-31 NOTE — Telephone Encounter (Signed)
Spoke with patients daughter. Made her aware of lab results and recommendations. He will start Entresto 24/26mg  twice daily. Verbalized understanding. Has appt for 2 weeks with pharmcy

## 2019-05-31 NOTE — Telephone Encounter (Signed)
-----   Message from Jolaine Artist, MD sent at 05/30/2019  1:23 PM EDT ----- Renal function improved. start Entresto 24/26 bid.

## 2019-06-01 ENCOUNTER — Other Ambulatory Visit (HOSPITAL_COMMUNITY): Payer: Self-pay | Admitting: Cardiology

## 2019-06-01 ENCOUNTER — Telehealth: Payer: Self-pay | Admitting: *Deleted

## 2019-06-01 ENCOUNTER — Telehealth (HOSPITAL_COMMUNITY): Payer: Self-pay | Admitting: Pharmacist

## 2019-06-01 MED ORDER — CARVEDILOL 6.25 MG PO TABS: 6.2500 mg | ORAL_TABLET | Freq: Two times a day (BID) | ORAL | 3 refills | Status: DC

## 2019-06-01 MED ORDER — FAMOTIDINE 20 MG PO TABS: 20.0000 mg | ORAL_TABLET | Freq: Every day | ORAL | 0 refills | Status: DC

## 2019-06-01 MED ORDER — FUROSEMIDE 40 MG PO TABS: 40.0000 mg | ORAL_TABLET | ORAL | 6 refills | Status: DC

## 2019-06-01 MED ORDER — ISOSORBIDE MONONITRATE ER 30 MG PO TB24: 30.0000 mg | ORAL_TABLET | Freq: Every day | ORAL | 3 refills | Status: DC

## 2019-06-01 MED ORDER — HYDRALAZINE HCL 25 MG PO TABS: 25.0000 mg | ORAL_TABLET | Freq: Three times a day (TID) | ORAL | 3 refills | Status: DC

## 2019-06-01 MED ORDER — TAMSULOSIN HCL 0.4 MG PO CAPS: 0.4000 mg | ORAL_CAPSULE | Freq: Every day | ORAL | 0 refills | Status: DC

## 2019-06-01 MED ORDER — ATORVASTATIN CALCIUM 80 MG PO TABS: 80.0000 mg | ORAL_TABLET | Freq: Every day | ORAL | 3 refills | Status: DC

## 2019-06-01 NOTE — Telephone Encounter (Signed)
Return Lawrence Olson call, she reports she called Dr. Tempie Hoist office and they filled his cardiac medication. This provider  refilled his Pepcid and Flomax , his PCP appointment in a few weeks. She verbalizes understanding.

## 2019-06-01 NOTE — Telephone Encounter (Signed)
Patient Advocate Encounter   Received notification from [employer] that prior authorization for Lawrence Olson is required.   PA submitted on CoverMyMeds Key [id number] Status is pending   Will continue to follow.  Audry Riles, PharmD, BCPS, CPP Heart Failure Clinic Pharmacist [phone]

## 2019-06-01 NOTE — Telephone Encounter (Addendum)
Lawrence Olson (daughter, listed on patients DPR)) left a message stating her father needs refills on his medications.  She states he he will run out before his upcoming appointment with primary care.  I contacted patients daughter and she  listed off several cardiac medications including atorvastatin, Imdur, hydralazine and furosemide. I asked if the patient is being followed by cardiology.  She stated that the patient was recently seen by Dr. Tempie Hoist. I stated that Dr. Tempie Hoist is responsible for all of the cardiac medications.  She said she would call them back. She states that her father needs his famotidine (Pepcid) and tamsulosin (Flomax) refilled.  She states patient will run out of those before his scheduled appt with PCP at [facility]. Patients medical record indicates that this medication was started by Reesa Chew, PA during patients hospital stay. Patient was seen in clinic by Danella Sensing, ANP on 05/25/2019 for a transitional care visit.

## 2019-06-08 ENCOUNTER — Telehealth (HOSPITAL_COMMUNITY): Payer: Self-pay | Admitting: Pharmacy Technician

## 2019-06-08 ENCOUNTER — Other Ambulatory Visit: Payer: Self-pay

## 2019-06-08 ENCOUNTER — Encounter: Payer: Self-pay | Admitting: Registered Nurse

## 2019-06-08 ENCOUNTER — Telehealth (HOSPITAL_COMMUNITY): Payer: Self-pay | Admitting: Cardiology

## 2019-06-08 ENCOUNTER — Ambulatory Visit (INDEPENDENT_AMBULATORY_CARE_PROVIDER_SITE_OTHER): Payer: PPO | Admitting: Registered Nurse

## 2019-06-08 VITALS — BP 92/57 | HR 75 | Temp 97.9°F | Resp 16 | Ht 66.54 in | Wt 176.5 lb

## 2019-06-08 DIAGNOSIS — Z23 Encounter for immunization: Secondary | ICD-10-CM | POA: Diagnosis not present

## 2019-06-08 DIAGNOSIS — Z125 Encounter for screening for malignant neoplasm of prostate: Secondary | ICD-10-CM | POA: Diagnosis not present

## 2019-06-08 DIAGNOSIS — Z1329 Encounter for screening for other suspected endocrine disorder: Secondary | ICD-10-CM | POA: Diagnosis not present

## 2019-06-08 DIAGNOSIS — Z13228 Encounter for screening for other metabolic disorders: Secondary | ICD-10-CM | POA: Diagnosis not present

## 2019-06-08 DIAGNOSIS — Z7689 Persons encountering health services in other specified circumstances: Secondary | ICD-10-CM

## 2019-06-08 DIAGNOSIS — Z130 Encounter for screening for diseases of the blood and blood-forming organs and certain disorders involving the immune mechanism: Secondary | ICD-10-CM | POA: Diagnosis not present

## 2019-06-08 DIAGNOSIS — E1165 Type 2 diabetes mellitus with hyperglycemia: Secondary | ICD-10-CM

## 2019-06-08 DIAGNOSIS — Z13220 Encounter for screening for lipoid disorders: Secondary | ICD-10-CM | POA: Diagnosis not present

## 2019-06-08 MED ORDER — LANCET DEVICE MISC: 1.0000 [IU] | Freq: Every day | 0 refills | Status: DC

## 2019-06-08 NOTE — Telephone Encounter (Signed)
Patients daughter called to report SBP being in the 110's-90's since increased hydralazine to 25 mg TID. Reports increased fatigue and dizziness with medication increase   Advised will forward to provider for further instructions

## 2019-06-08 NOTE — Telephone Encounter (Signed)
Advanced Heart Failure Patient Advocate Encounter  Prior Authorization for Entresto 24-26mg  has been approved.     Effective dates: 06/01/2019 through 05/31/2020  Patients co-pay is $45.00  Used [employer] to call and make sure co-pay was affordable for him. Had to leave message.  Will follow up.  [name], CPhT

## 2019-06-08 NOTE — Patient Instructions (Signed)
° ° ° °  If you have lab work done today you will be contacted with your lab results within the next 2 weeks.  If you have not heard from us then please contact us. The fastest way to get your results is to register for My Chart. ° ° °IF you received an x-ray today, you will receive an invoice from [facility]. Please contact [facility] at [phone] with questions or concerns regarding your invoice.  ° °IF you received labwork today, you will receive an invoice from [facility]. Please contact [facility] at [phone] with questions or concerns regarding your invoice.  ° °Our billing staff will not be able to assist you with questions regarding bills from these companies. ° °You will be contacted with the lab results as soon as they are available. The fastest way to get your results is to activate your My Chart account. Instructions are located on the last page of this paperwork. If you have not heard from us regarding the results in 2 weeks, please contact this office. °  ° ° ° °

## 2019-06-08 NOTE — Progress Notes (Signed)
Established Patient Office Visit  Subjective:  Patient ID: Lawrence Olson, male    DOB: 02/17/1951  Age: 68 y.o. [name] [id number]  CC:  Chief Complaint  Patient presents with  . Establish Care    need new pcp to manage medications and care     HPI Lawrence Olson presents for visit to establish care.  Very briefly, he unfortunately recently had a STEMI at work and was admitted to the hospital for stent placement and monitoring. There was significant concern for anoxic brain injury given the amount of time he went without adequate circulation. He has since established with cardiology and has a follow up visit with outpatient rehabilitation in the coming weeks. He had refused a neurology follow up  Presents today with his daughter, who translates for him and helps manage his care. He has a number of questions on his medications and coordinating which medications we will manage vs. Cardiology. Managed by cardiology will be any HF and HTN medications, including his: Carvedilol, furosemide, hydralazine, isosorbide mononitrate, and sacubitril-valsartan.  In primary care we can assume management of: Famotidine, insulin degludec, atorvastatin, and tamsulosin.   He feels well overall today. His A1c was elevated on admission to the ER, but has come down nicely to 6.6 as of four weeks ago. I anticipate this will fall further on testing today - should his kidney function continue to improve, we can consider stopping his insulin and moving to an oral agent. This was discussed with him  Additionally, we will monitor his lipids and consider dosage of atorvastatin reduction in the future - we discussed that this is not an immediate plan, as the cardioprotective factor of statins is too great to limit at this time.  We will plan to stagger visits every 3 months between cardiology visits. Strongly suggested CPE in the future.   Past Medical History:  Diagnosis Date  . Anoxic brain injury ([place])   . Jaundice     needed hospitalization in [place]  . Myocardial infarction [facility])     Past Surgical History:  Procedure Laterality Date  . RIGHT HEART CATH AND CORONARY ANGIOGRAPHY N/A 04/16/2019   Procedure: RIGHT HEART CATH AND CORONARY ANGIOGRAPHY;  Surgeon: Troy Sine, MD;  Location: [place] [facility];  Service: Cardiovascular;  Laterality: N/A;  . VENTRICULAR ASSIST DEVICE INSERTION N/A 04/16/2019   Procedure: VENTRICULAR ASSIST DEVICE INSERTION;  Surgeon: Troy Sine, MD;  Location: [place] [facility];  Service: Cardiovascular;  Laterality: N/A;    Family History  Problem Relation Age of Onset  . Healthy Brother   . Healthy Sister     Social History   Socioeconomic History  . Marital status: Married    Spouse name: Not on file  . Number of children: Not on file  . Years of education: Not on file  . Highest education level: Not on file  Occupational History  . Not on file  Social Needs  . Financial resource strain: Not hard at all  . Food insecurity    Worry: Never true    Inability: Never true  . Transportation needs    Medical: No    Non-medical: No  Tobacco Use  . Smoking status: Former Research scientist (life sciences)  . Smokeless tobacco: Never Used  Substance and Sexual Activity  . Alcohol use: Yes    Comment: rarely  . Drug use: Never  . Sexual activity: Not Currently  Lifestyle  . Physical activity    Days per week: 3 days  Minutes per session: 30 min  . Stress: Not at all  Relationships  . Social Herbalist on phone: Three times a week    Gets together: Twice a week    Attends religious service: Never    Active member of club or organization: No    Attends meetings of clubs or organizations: Never    Relationship status: Patient refused  . Intimate partner violence    Fear of current or ex partner: No    Emotionally abused: No    Physically abused: No    Forced sexual activity: No  Other Topics Concern  . Not on file  Social History Narrative  . Not on file     Outpatient Medications Prior to Visit  Medication Sig Dispense Refill  . acetaminophen (TYLENOL) 325 MG tablet Take 1-2 tablets (325-650 mg total) by mouth every 4 (four) hours as needed for mild pain.    Marland Kitchen aspirin 81 MG chewable tablet Chew 1 tablet (81 mg total) by mouth daily.    Marland Kitchen atorvastatin (LIPITOR) 80 MG tablet Take 1 tablet (80 mg total) by mouth daily at 6 PM. 30 tablet 3  . B Complex-C (B-COMPLEX WITH VITAMIN C) tablet Take 1 tablet by mouth daily.    . blood glucose meter kit and supplies KIT Dispense based on patient and insurance preference. Use up to four times daily as directed. (FOR ICD-9 250.00, 250.01). 1 each 0  . carvedilol (COREG) 6.25 MG tablet Take 1 tablet (6.25 mg total) by mouth 2 (two) times daily with a meal. 180 tablet 3  . famotidine (PEPCID) 20 MG tablet Take 1 tablet (20 mg total) by mouth daily. 30 tablet 0  . furosemide (LASIX) 40 MG tablet Take 1 tablet (40 mg total) by mouth as directed. Take every day except for Saturday and Sunday 75 tablet 6  . hydrALAZINE (APRESOLINE) 25 MG tablet Take 1 tablet (25 mg total) by mouth every 8 (eight) hours. 270 tablet 3  . Insulin Degludec (TRESIBA) 100 UNIT/ML SOLN Inject 15 Units into the skin 2 (two) times daily. 50 mL 1  . Insulin Syringes, Disposable, U-100 0.5 ML MISC 1 application by Does not apply route 2 (two) times daily. 100 each 0  . isosorbide mononitrate (IMDUR) 30 MG 24 hr tablet Take 1 tablet (30 mg total) by mouth daily. 30 tablet 3  . sacubitril-valsartan (ENTRESTO) 24-26 MG Take 1 tablet by mouth 2 (two) times daily. 60 tablet 6  . tamsulosin (FLOMAX) 0.4 MG CAPS capsule Take 1 capsule (0.4 mg total) by mouth daily after supper. 30 capsule 0   No facility-administered medications prior to visit.     No Known Allergies  ROS Review of Systems  Constitutional: Negative.   HENT: Negative.   Eyes: Negative.   Respiratory: Negative.   Cardiovascular: Negative.   Gastrointestinal: Negative.    Endocrine: Negative.   Genitourinary: Negative.   Musculoskeletal: Negative.   Skin: Negative.   Allergic/Immunologic: Negative.   Neurological: Negative.   Hematological: Negative.   Psychiatric/Behavioral: Negative.   All other systems reviewed and are negative.     Objective:    Physical Exam  Constitutional: He is oriented to person, place, and time. He appears well-developed and well-nourished. No distress.  Cardiovascular: Normal rate and regular rhythm.  Pulmonary/Chest: Effort normal. No respiratory distress.  Neurological: He is alert and oriented to person, place, and time.  Skin: Skin is warm and dry. No rash noted. He is not  diaphoretic. No erythema. No pallor.  Psychiatric: He has a normal mood and affect. His behavior is normal. Judgment and thought content normal.  Nursing note and vitals reviewed.   BP (!) 92/57   Pulse 75   Temp 97.9 F (36.6 C) (Oral)   Resp 16   Ht 5' 6.54" (1.69 m)   Wt 176 lb 8 oz (80.1 kg)   SpO2 98%   BMI 28.03 kg/m  Wt Readings from Last 3 Encounters:  06/08/19 176 lb 8 oz (80.1 kg)  05/30/19 174 lb 9.6 oz (79.2 kg)  05/25/19 176 lb (79.8 kg)     Health Maintenance Due  Topic Date Due  . Hepatitis C Screening  December 17, 1949  . FOOT EXAM  06/27/1960  . OPHTHALMOLOGY EXAM  06/27/1960  . TETANUS/TDAP  06/27/1969  . COLONOSCOPY  06/27/2000  . PNA vac Low Risk Adult (1 of 2 - PCV13) 06/28/2015    There are no preventive care reminders to display for this patient.  No results found for: TSH Lab Results  Component Value Date   WBC 9.1 05/30/2019   HGB 9.7 (L) 05/30/2019   HCT 30.6 (L) 05/30/2019   MCV 98.1 05/30/2019   PLT 408 (H) 05/30/2019   Lab Results  Component Value Date   NA 137 05/30/2019   K 3.8 05/30/2019   CO2 21 (L) 05/30/2019   GLUCOSE 113 (H) 05/30/2019   BUN 41 (H) 05/30/2019   CREATININE 1.66 (H) 05/30/2019   BILITOT 1.0 05/05/2019   ALKPHOS 80 05/05/2019   AST 23 05/05/2019   ALT 57 (H)  05/05/2019   PROT 6.6 05/05/2019   ALBUMIN 2.4 (L) 05/05/2019   CALCIUM 9.3 05/30/2019   ANIONGAP 13 05/30/2019   Lab Results  Component Value Date   CHOL 167 04/16/2019   Lab Results  Component Value Date   HDL 45 04/16/2019   Lab Results  Component Value Date   LDLCALC 102 (H) 04/16/2019   Lab Results  Component Value Date   TRIG 96 04/29/2019   Lab Results  Component Value Date   CHOLHDL 3.7 04/16/2019   Lab Results  Component Value Date   HGBA1C 6.6 (H) 05/11/2019      Assessment & Plan:   Problem List Items Addressed This Visit    None    Visit Diagnoses    Flu vaccine need    -  Primary   Relevant Orders   Flu Vaccine QUAD High Dose(Fluad) (Completed)   Screening for endocrine, metabolic and immunity disorder       Relevant Orders   CBC with Differential/Platelet   Comprehensive metabolic panel   Hemoglobin A1c   Screening PSA (prostate specific antigen)       Relevant Orders   PSA   Lipid screening       Relevant Orders   Lipid panel      Meds ordered this encounter  Medications  . Lancet Device MISC    Sig: 1 Units by Does not apply route daily before breakfast.    Dispense:  90 each    Refill:  0    Order Specific Question:   Supervising Provider    Answer:   Forrest Moron [id number]    Follow-up: Return in about 3 months (around 09/08/2019).   PLAN  Reviewed in depth medical history and recent hospitalization  Labs drawn, will follow up as warranted  Encourage to return for CPE  Patient encouraged to call clinic with any questions, comments,  or concerns.   [name], NP

## 2019-06-08 NOTE — Telephone Encounter (Signed)
Spoke with daughter, they have picked up the medication and are able to afford the co-pay on Entresto.  [name], CPhT

## 2019-06-08 NOTE — Telephone Encounter (Signed)
Please cut hydralazine to 12.5 mg three times a day   Please call.   Amy Clegg NP-C  4:39 PM

## 2019-06-09 LAB — COMPREHENSIVE METABOLIC PANEL
ALT: 33 IU/L (ref 0–44)
AST: 23 IU/L (ref 0–40)
Albumin/Globulin Ratio: 1.2 (ref 1.2–2.2)
Albumin: 3.7 g/dL — ABNORMAL LOW (ref 3.8–4.8)
Alkaline Phosphatase: 103 IU/L (ref 39–117)
BUN/Creatinine Ratio: 23 (ref 10–24)
BUN: 39 mg/dL — ABNORMAL HIGH (ref 8–27)
Bilirubin Total: 0.3 mg/dL (ref 0.0–1.2)
CO2: 23 mmol/L (ref 20–29)
Calcium: 9.1 mg/dL (ref 8.6–10.2)
Chloride: 102 mmol/L (ref 96–106)
Creatinine, Ser: 1.66 mg/dL — ABNORMAL HIGH (ref 0.76–1.27)
GFR calc Af Amer: 48 mL/min/{1.73_m2} — ABNORMAL LOW (ref >59)
GFR calc non Af Amer: 42 mL/min/{1.73_m2} — ABNORMAL LOW (ref >59)
Globulin, Total: 3.2 g/dL (ref 1.5–4.5)
Glucose: 144 mg/dL — ABNORMAL HIGH (ref 65–99)
Potassium: 4 mmol/L (ref 3.5–5.2)
Sodium: 139 mmol/L (ref 134–144)
Total Protein: 6.9 g/dL (ref 6.0–8.5)

## 2019-06-09 LAB — PSA: Prostate Specific Ag, Serum: 0.4 ng/mL (ref 0.0–4.0)

## 2019-06-09 LAB — CBC WITH DIFFERENTIAL/PLATELET
Basophils Absolute: 0 10*3/uL (ref 0.0–0.2)
Basos: 0 %
EOS (ABSOLUTE): 0.2 10*3/uL (ref 0.0–0.4)
Eos: 1 %
Hematocrit: 30.6 % — ABNORMAL LOW (ref 37.5–51.0)
Hemoglobin: 10 g/dL — ABNORMAL LOW (ref 13.0–17.7)
Immature Grans (Abs): 0 10*3/uL (ref 0.0–0.1)
Immature Granulocytes: 0 %
Lymphocytes Absolute: 2 10*3/uL (ref 0.7–3.1)
Lymphs: 19 %
MCH: 31 pg (ref 26.6–33.0)
MCHC: 32.7 g/dL (ref 31.5–35.7)
MCV: 95 fL (ref 79–97)
Monocytes Absolute: 0.8 10*3/uL (ref 0.1–0.9)
Monocytes: 8 %
Neutrophils Absolute: 7.5 10*3/uL — ABNORMAL HIGH (ref 1.4–7.0)
Neutrophils: 72 %
Platelets: 444 10*3/uL (ref 150–450)
RBC: 3.23 x10E6/uL — ABNORMAL LOW (ref 4.14–5.80)
RDW: 14.6 % (ref 11.6–15.4)
WBC: 10.5 10*3/uL (ref 3.4–10.8)

## 2019-06-09 LAB — LIPID PANEL
Chol/HDL Ratio: 2.6 ratio (ref 0.0–5.0)
Cholesterol, Total: 119 mg/dL (ref 100–199)
HDL: 45 mg/dL (ref >39)
LDL Chol Calc (NIH): 54 mg/dL (ref 0–99)
Triglycerides: 106 mg/dL (ref 0–149)
VLDL Cholesterol Cal: 20 mg/dL (ref 5–40)

## 2019-06-09 LAB — HEMOGLOBIN A1C
Est. average glucose Bld gHb Est-mCnc: 143 mg/dL
Hgb A1c MFr Bld: 6.6 % — ABNORMAL HIGH (ref 4.8–5.6)

## 2019-06-11 ENCOUNTER — Encounter: Payer: Self-pay | Admitting: Registered Nurse

## 2019-06-11 MED ORDER — HYDRALAZINE HCL 25 MG PO TABS: 12.5000 mg | ORAL_TABLET | Freq: Three times a day (TID) | ORAL | 3 refills | Status: DC

## 2019-06-11 NOTE — Progress Notes (Signed)
Results returned Stable, no major changes, no major concerns at this time Will plan to follow up in 3 mos  Cardiology follow up in [place], NP

## 2019-06-11 NOTE — Addendum Note (Signed)
Addended by: JEFFRIES, Sharlot Gowda on: 06/11/2019 03:21 PM   Modules accepted: Orders

## 2019-06-11 NOTE — Telephone Encounter (Signed)
Pt aware via daughter and voiced understanding  °

## 2019-06-12 ENCOUNTER — Other Ambulatory Visit: Payer: Self-pay | Admitting: Registered Nurse

## 2019-06-12 NOTE — Telephone Encounter (Signed)
ONE TOUCH ULTRA BLUE TESTST(NEW)100 is not listed on med list.

## 2019-06-12 NOTE — Telephone Encounter (Signed)
Pt needs the testing strips and the needles

## 2019-06-13 ENCOUNTER — Other Ambulatory Visit: Payer: Self-pay

## 2019-06-13 ENCOUNTER — Ambulatory Visit (HOSPITAL_COMMUNITY)
Admission: RE | Admit: 2019-06-13 | Discharge: 2019-06-13 | Disposition: A | Discharge status: Home / Self Care | Payer: PPO | Source: Ambulatory Visit | Attending: Internal Medicine | Admitting: Internal Medicine

## 2019-06-13 VITALS — BP 120/74 | HR 71 | Wt 178.4 lb

## 2019-06-13 DIAGNOSIS — I2109 ST elevation (STEMI) myocardial infarction involving other coronary artery of anterior wall: Secondary | ICD-10-CM | POA: Diagnosis not present

## 2019-06-13 DIAGNOSIS — I5022 Chronic systolic (congestive) heart failure: Secondary | ICD-10-CM | POA: Diagnosis not present

## 2019-06-13 MED ORDER — SPIRONOLACTONE 25 MG PO TABS: 12.5000 mg | ORAL_TABLET | Freq: Every day | ORAL | 3 refills | Status: DC

## 2019-06-13 NOTE — Patient Instructions (Signed)
It was a pleasure seeing you today!  MEDICATIONS: -We are changing your medications today -Start spironolactone 12.5 mg (1/2 tab) daily. -Call if you have questions about your medications.  LABS: -We will call you if your labs need attention.  NEXT APPOINTMENT: Return to clinic in 3 with Pharmacy Clinic.  In general, to take care of your heart failure: -Limit your fluid intake to 2 Liters (half-gallon) per day.   -Limit your salt intake to ideally 2-3 grams (2000-3000 mg) per day. -Weigh yourself daily and record, and bring that "weight diary" to your next appointment.  (Weight gain of 2-3 pounds in 1 day typically means fluid weight.) -The medications for your heart are to help your heart and help you live longer.   -Please contact us before stopping any of your heart medications.  Call the clinic at [phone] with questions or to reschedule future appointments.

## 2019-06-18 NOTE — Progress Notes (Addendum)
HF Cardiologist: Bensimhon  HPI:  Lawrence Olson is a 68 year old male from [place] with h/o CAD, systolic HF and recent cardiac arrest.   He had no known medical problems until 04/16/19 when he experienced witnessed VF arrest at work. Had bystander CPR x 20 mins with defib and intubation in field. He was taken to cath lab for emergent catheterization. Attempts at opening LAD unsuccessful (felt to be chronic). Impella placed and he was taken to CCU. EF 25-30%. Had protracted course with respiratory failure, renal failure (requiring CVVHD) and anoxic brain injury. There was also a question of acute amio pneumonitis with ESR> 100.   Extubated 05/01/19. Mental status and renal function improved. Discharged to CIR. Did well in CIR with dramatic improvement. Lifevest placed. Creatinine plateaued at 2.4 so ACE/ARB/ARNI not started.   Recently presented to HF Clinic for follow-up with Dr. Haroldine Laws on 05/30/19. Reported feeling very good overall. He tries to stay active, walking 10-15 minutes every day. Mild SOB/chest tightness. No edema, orthopnea or PND. Wearing lifevest without problem. Has not had any dizziness.  Appetite good. BP at home 120-130. ReDS 41%.  Today he returns to HF clinic for pharmacist medication titration. At last visit with MD, furosemide was increased to 40 mg Monday-Friday, carvedilol was increased to 6.25 mg BID, hydralazine 25 mg TID and Entresto 24/26 mg BID was initiated. However, his hydralazine was decreased back to 12.5 mg TID because his SBP had dropped to 110-90s. Since the hydralazine was decreased, he has not experienced any dizziness or lightheadedness. He does experience some chest pain/pressure when walking. The pain relives with rest and only occurs when walking long distances (~30 minutes). No SOB/DOE. He walks 15 minutes every day without stopping. He takes furosemide 40 mg every Monday-Friday. His weight has increased, but he believes this to be because he is eating  better since he was discharged. No LEE, PND or orthopnea. Unable to obtain ReDS reading as his Life Vest interfered with his reading. His appetite is good and he avoids salty foods.     . Shortness of breath/dyspnea on exertion? no  . Orthopnea/PND? no . Edema? no . Lightheadedness/dizziness? no . Daily weights at home? yes . Blood pressure/heart rate monitoring at home? no . Following low-sodium/fluid-restricted diet? yes  HF Medications: Carvedilol 6.25 mg BID Entresto 24/26 mg BID Hydralazine 12.5 mg TID Imdur 30 mg daily Furosemide 40 mg Monday-Friday  Has the patient been experiencing any side effects to the medications prescribed?  no  Does the patient have any problems obtaining medications due to transportation or finances?   No - has Medicare part D.   Understanding of regimen: fair Understanding of indications: good Potential of compliance: good Patient understands to avoid NSAIDs. Patient understands to avoid decongestants.    Pertinent Lab Values (06/08/19): Marland Kitchen Serum creatinine 1.66, BUN 39, Potassium 4.0, Sodium 139, BNP 372.5  Vital Signs: . Weight: 178.4 lbs (last clinic weight: 176.5) . Blood pressure: 120/74  . Heart rate: 71   Assessment: 1. Acute systolic HF - Echo 9/70 EF 25-30% in setting of cardiac arrest and severe iCM - NYHA II - Euvolemic on exam.  - Vitals: BP stable at 120/74, HR 71 - Labs form 06/08/19 normal: Scr 1.66, K 4.0 - Continue furosemide 40 mg Monday-Friday - Continue carvedilol 6.25 mg BID - Continue Entresto 24/26 mg BID - Start spironolactone 12.5 mg daily. Repeat BMET in 1 week.  - Continue hydralazine 12.5 mg TID.  - Continue Imdur 30  daily - Consider SGLT2i at future visit if creatinine remains stable.   - Continue Lifevest   2. CAD s/p recent anterior MI  - Treated medically as unable to open LAD CTO - mild chest tightness with activity but stable - Continue ASA/statin/imdur  3. H/o VF arrest - continue  Lifevest - echo 3 months - f/u with EP - Will need eventual ICD  4. DM2  - PCP managing, last A1C 6.6% (05/11/2019) - Continue Tresiba 15 units BID. 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  - Has f/u Nephrology    Plan: 1) Medication changes: Based on clinical presentation, vital signs and recent labs will start spironolactone 12.5 mg daily. Repeat BMET in 1 week.  2) Labs: Scr 1.66, K 4.0 3) Follow-up: BMET in 1 week, Pharmacy Clinic in 3 weeks.   Audry Riles, PharmD, BCPS, CPP Heart Failure Clinic Pharmacist [phone]   Patient discussed with Dr. Lynelle Smoke.  Agree with assessment and plan.  Will continue to slowly up-titrate  GDMT.  Bonnita Nasuti Pharm.D. CPP, BCPS Clinical Pharmacist [phone] 06/18/2019 8:09 PM   Agree. Would stop hydral and Imdur.   Glori Bickers, MD  10:35 PM

## 2019-06-21 ENCOUNTER — Other Ambulatory Visit (HOSPITAL_COMMUNITY): Payer: Self-pay

## 2019-06-21 ENCOUNTER — Ambulatory Visit (HOSPITAL_COMMUNITY)
Admission: RE | Admit: 2019-06-21 | Discharge: 2019-06-21 | Disposition: A | Discharge status: Home / Self Care | Payer: PPO | Source: Ambulatory Visit | Attending: Internal Medicine | Admitting: Internal Medicine

## 2019-06-21 ENCOUNTER — Other Ambulatory Visit: Payer: Self-pay

## 2019-06-21 DIAGNOSIS — I5022 Chronic systolic (congestive) heart failure: Secondary | ICD-10-CM

## 2019-06-21 LAB — BASIC METABOLIC PANEL
Anion gap: 10 (ref 5–15)
BUN: 42 mg/dL — ABNORMAL HIGH (ref 8–23)
CO2: 25 mmol/L (ref 22–32)
Calcium: 9.3 mg/dL (ref 8.9–10.3)
Chloride: 104 mmol/L (ref 98–111)
Creatinine, Ser: 1.63 mg/dL — ABNORMAL HIGH (ref 0.61–1.24)
GFR calc Af Amer: 49 mL/min — ABNORMAL LOW (ref >60)
GFR calc non Af Amer: 43 mL/min — ABNORMAL LOW (ref >60)
Glucose, Bld: 113 mg/dL — ABNORMAL HIGH (ref 70–99)
Potassium: 4.4 mmol/L (ref 3.5–5.1)
Sodium: 139 mmol/L (ref 135–145)

## 2019-06-28 ENCOUNTER — Encounter: Payer: Self-pay | Admitting: Physical Medicine & Rehabilitation

## 2019-06-28 ENCOUNTER — Other Ambulatory Visit: Payer: Self-pay | Admitting: Emergency Medicine

## 2019-06-28 ENCOUNTER — Other Ambulatory Visit: Payer: Self-pay

## 2019-06-28 ENCOUNTER — Encounter: Payer: PPO | Attending: Physical Medicine & Rehabilitation | Admitting: Physical Medicine & Rehabilitation

## 2019-06-28 ENCOUNTER — Ambulatory Visit: Payer: PPO | Admitting: Registered Nurse

## 2019-06-28 VITALS — BP 109/69 | HR 79 | Temp 97.7°F | Ht 67.0 in | Wt 179.0 lb

## 2019-06-28 DIAGNOSIS — I2510 Atherosclerotic heart disease of native coronary artery without angina pectoris: Secondary | ICD-10-CM | POA: Insufficient documentation

## 2019-06-28 DIAGNOSIS — G931 Anoxic brain damage, not elsewhere classified: Secondary | ICD-10-CM | POA: Diagnosis not present

## 2019-06-28 DIAGNOSIS — E1165 Type 2 diabetes mellitus with hyperglycemia: Secondary | ICD-10-CM | POA: Diagnosis not present

## 2019-06-28 DIAGNOSIS — R0789 Other chest pain: Secondary | ICD-10-CM | POA: Diagnosis not present

## 2019-06-28 DIAGNOSIS — I5043 Acute on chronic combined systolic (congestive) and diastolic (congestive) heart failure: Secondary | ICD-10-CM

## 2019-06-28 MED ORDER — INSULIN SYRINGES (DISPOSABLE) U-100 0.5 ML MISC: 1.0000 "application " | Freq: Two times a day (BID) | 0 refills | Status: DC

## 2019-06-28 MED ORDER — LANCET DEVICE MISC: 1.0000 [IU] | Freq: Every day | 0 refills | Status: DC

## 2019-06-28 NOTE — Progress Notes (Signed)
Subjective:    Patient ID: Lawrence Olson, male    DOB: August 01, 1951, 68 y.o.   [name] [id number]  HPI Male in relatively good health presents for follow up after receiving CIR for cardiac debility.   Last clinic visit on 05/25/2019 with NP, notes reviewed.  Daughter provides history. He is following with HF team as well as PCP. He completed therapies, doing HEP. He has been checking his weights, which are stable. He initially stated he was taking seroquel, but later states he does not take it. CBGs have been controlled. HF is following renal function as well. Has some urinary frequency, but no incontinence. Bowel movements are regular. Denies falls.   Pain Inventory Average Pain 0 Pain Right Now 0 My pain is no pain  In the last 24 hours, has pain interfered with the following? General activity 0 Relation with others 0 Enjoyment of life 0 What TIME of day is your pain at its worst? no pain Sleep (in general) Good  Pain is worse with: no pain Pain improves with: no pain Relief from Meds: no pain  Mobility walk without assistance how many minutes can you walk? 20-30 ability to climb steps?  yes do you drive?  no  Function retired  Neuro/Psych No problems in this area  Prior Studies Any changes since last visit?  no  Physicians involved in your care Any changes since last visit?  no   Family History  Problem Relation Age of Onset  . Healthy Brother   . Healthy Sister    Social History   Socioeconomic History  . Marital status: Married    Spouse name: Not on file  . Number of children: Not on file  . Years of education: Not on file  . Highest education level: Not on file  Occupational History  . Not on file  Social Needs  . Financial resource strain: Not hard at all  . Food insecurity    Worry: Never true    Inability: Never true  . Transportation needs    Medical: No    Non-medical: No  Tobacco Use  . Smoking status: Former Research scientist (life sciences)  . Smokeless tobacco:  Never Used  Substance and Sexual Activity  . Alcohol use: Yes    Comment: rarely  . Drug use: Never  . Sexual activity: Not Currently  Lifestyle  . Physical activity    Days per week: 3 days    Minutes per session: 30 min  . Stress: Not at all  Relationships  . Social Herbalist on phone: Three times a week    Gets together: Twice a week    Attends religious service: Never    Active member of club or organization: No    Attends meetings of clubs or organizations: Never    Relationship status: Patient refused  Other Topics Concern  . Not on file  Social History Narrative  . Not on file   Past Surgical History:  Procedure Laterality Date  . RIGHT HEART CATH AND CORONARY ANGIOGRAPHY N/A 04/16/2019   Procedure: RIGHT HEART CATH AND CORONARY ANGIOGRAPHY;  Surgeon: Troy Sine, MD;  Location: [place] [facility];  Service: Cardiovascular;  Laterality: N/A;  . VENTRICULAR ASSIST DEVICE INSERTION N/A 04/16/2019   Procedure: VENTRICULAR ASSIST DEVICE INSERTION;  Surgeon: Troy Sine, MD;  Location: [place] [facility];  Service: Cardiovascular;  Laterality: N/A;   Past Medical History:  Diagnosis Date  . Anoxic brain injury ([place])   .  Jaundice    needed hospitalization in [place]  . Myocardial infarction ([facility])    BP 109/69   Pulse 79   Temp 97.7 F (36.5 C)   Ht 5\' 7"  (1.702 m)   Wt 179 lb (81.2 kg)   SpO2 94%   BMI 28.04 kg/m   Opioid Risk Score:   Fall Risk Score:  `1  Depression screen PHQ 2/9  Depression screen [facility] 2/9 06/08/2019 01/21/2017 12/08/2015  Decreased Interest 0 0 0  Down, Depressed, Hopeless 0 0 0  PHQ - 2 Score 0 0 0    Review of Systems  Constitutional: Negative.   HENT: Negative.   Eyes: Negative.   Respiratory: Negative.   Cardiovascular: Negative.   Gastrointestinal: Negative.   Endocrine: Negative.   Genitourinary: Negative.   Musculoskeletal: Negative.   Skin: Negative.   Allergic/Immunologic: Negative.   Neurological:  Negative.   Hematological: Negative.   Psychiatric/Behavioral: Negative.       Objective:   Physical Exam  Constitutional: No distress . Vital signs reviewed. HENT: Normocephalic.  Atraumatic. Eyes: EOMI. No discharge. Cardiovascular: No JVD. Respiratory: Normal effort.  No stridor. GI: Non-distended. Skin: Warm and dry.  Intact. Psych: Normal mood.  Normal behavior. Musc: No edema in extremities.  No tenderness in extremities. Neurologic: Alert Motor: Grossly 4-4+/5 throughout      Assessment & Plan:  Male in relatively good health presents for follow up after receiving CIR for cardiac debility.   1. Debility and anoxic encephalopathy secondary to VT cardiac arrest  Continue HEP  2. Pain Management:  Controlled  3.  CAD with STEMI/VT arrest:   Cont LifeVest in place.   Cont follow up with HF team for possible further medication management and further procedures              4.  T2DM with hyperglycemia:   Needs needles/test strips, attempting to follow up with PCP  5. Urine retention:   Consider trial d/c of Flomax  - pt to discuss with HF

## 2019-07-05 ENCOUNTER — Ambulatory Visit (HOSPITAL_COMMUNITY)
Admission: RE | Admit: 2019-07-05 | Discharge: 2019-07-05 | Disposition: A | Discharge status: Home / Self Care | Payer: PPO | Source: Ambulatory Visit | Attending: Internal Medicine | Admitting: Internal Medicine

## 2019-07-05 ENCOUNTER — Other Ambulatory Visit: Payer: Self-pay

## 2019-07-05 ENCOUNTER — Other Ambulatory Visit: Payer: Self-pay | Admitting: Registered Nurse

## 2019-07-05 VITALS — BP 108/68 | HR 78 | Wt 178.2 lb

## 2019-07-05 DIAGNOSIS — I5043 Acute on chronic combined systolic (congestive) and diastolic (congestive) heart failure: Secondary | ICD-10-CM | POA: Diagnosis not present

## 2019-07-05 MED ORDER — SPIRONOLACTONE 25 MG PO TABS: 25.0000 mg | ORAL_TABLET | Freq: Every day | ORAL | 3 refills | Status: DC

## 2019-07-05 NOTE — Patient Instructions (Addendum)
It was a pleasure seeing you today!  MEDICATIONS: -We are changing your medications today -Stop hydralazine and isosorbide mononitrate. I am hopeful this will help decrease your dizziness.  -After 3 days start taking spironolactone 25 mg (1 tablet) daily.  -Call if you have questions about your medications.  NEXT APPOINTMENT: Return to clinic in 1 week for labs. You see Dr. Haroldine Laws on 07/30/2019.    In general, to take care of your heart failure: -Limit your fluid intake to 2 Liters (half-gallon) per day.   -Limit your salt intake to ideally 2-3 grams (2000-3000 mg) per day. -Weigh yourself daily and record, and bring that "weight diary" to your next appointment.  (Weight gain of 2-3 pounds in 1 day typically means fluid weight.) -The medications for your heart are to help your heart and help you live longer.   -Please contact us before stopping any of your heart medications.  Call the clinic at [phone] with questions or to reschedule future appointments.

## 2019-07-05 NOTE — Telephone Encounter (Signed)
Needs to be refilled and managed by patients primary care provider. 

## 2019-07-10 NOTE — Progress Notes (Signed)
HF Cardiologist: Bensimhon  HPI:  Lawrence Olson is a 68 year old male from [place] with h/o CAD, systolic HF and recent cardiac arrest.   He had no known medical problems until 04/16/19 when he experienced witnessed VF arrest at work. Had bystander CPR x 20 mins with defib and intubation in field. He was taken to cath lab for emergent catheterization. Attempts at opening LAD unsuccessful (felt to be chronic). Impella placed and he was taken to CCU. EF 25-30%. Had protracted course with respiratory failure, renal failure (requiring CVVHD) and anoxic brain injury. There was also a question of acute amio pneumonitis with ESR> 100.   Extubated 05/01/19. Mental status and renal function improved. Discharged to CIR. Did well in CR with dramatic improvement. Lifevest placed. Creatinine plateaued at 2.4 so ACE/ARB/ARNI not started.   Recently presented to HF Clinic for follow-up with Dr. Haroldine Laws on 05/30/19. Reported feeling very good overall. He tries to stay active, walking 10-15 minutes every day. Mild SOB/chest tightness. No edema, orthopnea or PND. Wearing lifevest without problem. Has not had any dizziness.  Appetite good. BP at home 120-130. ReDS 41%.   Today he returns to HF clinic for pharmacist medication titration. At last visit with pharmacy clinic, spironolactone 12.5 mg daily was added. Since last visit he has been experiencing a lot of dizziness and presyncope. Reports his SBP is <100 when this occurs. No more chest pain/pressure while walking. His breathing is good and he is increasing his exercise. He now walks 20-25 minutes every day and feels great. His weight has been stable at home at 177-178 lbs. He takes furosemide Monday-Friday and has not needed any extra. No LEE, PND or orthopnea. His appetite is good and he follows a low salt diet.     . Shortness of breath/dyspnea on exertion? no  . Orthopnea/PND? no . Edema? no . Lightheadedness/dizziness? Yes . Daily weights at home?  yes . Blood pressure/heart rate monitoring at home? Yes . Following low-sodium/fluid-restricted diet? yes  HF Medications: Carvedilol 6.25 mg BID Entresto 24/26 mg BID Spironolactone 12.5 mg daily Hydralazine 12.5 mg TID Imdur 30 mg daily Furosemide 40 mg Monday-Friday  Has the patient been experiencing any side effects to the medications prescribed?  Yes - dizziness since add spironolactone.   Does the patient have any problems obtaining medications due to transportation or finances?   No - has Medicare part D.   Understanding of regimen: fair Understanding of indications: good Potential of compliance: good Patient understands to avoid NSAIDs. Patient understands to avoid decongestants.    Pertinent Lab Values (06/21/19): Marland Kitchen Serum creatinine 1.63, BUN 42, Potassium 4.4, Sodium 139, BNP 372.5 (05/30/19)  Vital Signs: . Weight: 178.2 lbs (last clinic weight: 178.4) . Blood pressure: 108/68   . Heart rate: 78   Assessment: 1. Acute systolic HF - Echo 2/70 EF 25-30% in setting of cardiac arrest and severe iCM - NYHA II - Euvolemic on exam.  - Vitals: BP on lower end of normal in clinic 108/68. Has had frequent SBP <100 at home.  - Labs from 06/21/19 normal: Scr 1.63, K 4.4 - Continue furosemide 40 mg Monday-Friday - Continue carvedilol 6.25 mg BID - Continue Entresto 24/26 mg BID. Will hopefully be able to increase next visit if dizziness resolves after stopping hydralazine/imdur.  - Increase spironolactone to 25 mg daily in 2-3 days if dizziness resolves. Repeat BMET in 1 week.  - Stop hydralazine 12.5 mg TID and imdur 30 mg daily due to dizziness as  well as no noted benefit in Caucasian HFrEF patients tolerating an ACE/ARB/ARNI.  - Consider SGLT2i at future visit if creatinine remains stable.   - Continue Lifevest   2. CAD s/p recent anterior MI  - Treated medically as unable to open LAD CTO - No longer complains of chest pain/pressure with activity.  - Continue  ASA/statin. Stop Imdur.  3. H/o VF arrest - continue Lifevest - echo 3 months - f/u with EP - Will need eventual ICD  4. DM2  - PCP managing, last A1C 6.6% (05/11/2019) - Continue Tresiba 15 units BID. 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  - Has f/u Nephrology    Plan: 1) Medication changes: Based on clinical presentation, vital signs and recent labs will Stop hydralazine and imdur. Increase spironolactone to 25 mg daily in 2-3 days if dizziness resolves. Will hopefully be able to increase Entresto at next visit if dizziness resolves with discontinuation of hydralazine/imdur.  2) Labs: Scr 1.63, K 4.4.  3) Follow-up: Repeat BMET in 1 week. Return to HF Clinic with Dr. Haroldine Laws in 3 weeks.   Lawrence Olson, PharmD, BCPS, CPP Heart Failure Clinic Pharmacist [phone]  Discussed patient with Dr. Lynelle Smoke.  Agree with assessment and plan.   Lawrence Olson Pharm.D. CPP, BCPS Clinical Pharmacist [phone] 07/10/2019 10:25 PM

## 2019-07-13 ENCOUNTER — Ambulatory Visit (HOSPITAL_COMMUNITY)
Admission: RE | Admit: 2019-07-13 | Discharge: 2019-07-13 | Disposition: A | Discharge status: Home / Self Care | Payer: PPO | Source: Ambulatory Visit | Attending: Cardiology | Admitting: Cardiology

## 2019-07-13 ENCOUNTER — Other Ambulatory Visit: Payer: Self-pay

## 2019-07-13 ENCOUNTER — Other Ambulatory Visit (HOSPITAL_COMMUNITY): Payer: Self-pay

## 2019-07-13 DIAGNOSIS — I5022 Chronic systolic (congestive) heart failure: Secondary | ICD-10-CM

## 2019-07-13 LAB — BASIC METABOLIC PANEL
Anion gap: 10 (ref 5–15)
BUN: 33 mg/dL — ABNORMAL HIGH (ref 8–23)
CO2: 27 mmol/L (ref 22–32)
Calcium: 9.2 mg/dL (ref 8.9–10.3)
Chloride: 103 mmol/L (ref 98–111)
Creatinine, Ser: 1.4 mg/dL — ABNORMAL HIGH (ref 0.61–1.24)
GFR calc Af Amer: 59 mL/min — ABNORMAL LOW (ref >60)
GFR calc non Af Amer: 51 mL/min — ABNORMAL LOW (ref >60)
Glucose, Bld: 124 mg/dL — ABNORMAL HIGH (ref 70–99)
Potassium: 4.3 mmol/L (ref 3.5–5.1)
Sodium: 140 mmol/L (ref 135–145)

## 2019-07-14 DIAGNOSIS — I2109 ST elevation (STEMI) myocardial infarction involving other coronary artery of anterior wall: Secondary | ICD-10-CM | POA: Diagnosis not present

## 2019-07-30 ENCOUNTER — Ambulatory Visit (HOSPITAL_BASED_OUTPATIENT_CLINIC_OR_DEPARTMENT_OTHER)
Admission: RE | Admit: 2019-07-30 | Discharge: 2019-07-30 | Disposition: A | Discharge status: Home / Self Care | Payer: PPO | Source: Ambulatory Visit | Attending: Internal Medicine | Admitting: Internal Medicine

## 2019-07-30 ENCOUNTER — Encounter (HOSPITAL_COMMUNITY): Payer: Self-pay | Admitting: Internal Medicine

## 2019-07-30 ENCOUNTER — Other Ambulatory Visit: Payer: Self-pay

## 2019-07-30 ENCOUNTER — Ambulatory Visit (HOSPITAL_COMMUNITY)
Admission: RE | Admit: 2019-07-30 | Discharge: 2019-07-30 | Disposition: A | Discharge status: Home / Self Care | Payer: PPO | Source: Ambulatory Visit | Attending: Internal Medicine | Admitting: Internal Medicine

## 2019-07-30 ENCOUNTER — Other Ambulatory Visit (HOSPITAL_COMMUNITY): Payer: Self-pay | Admitting: *Deleted

## 2019-07-30 VITALS — BP 100/66 | HR 59 | Wt 186.2 lb

## 2019-07-30 DIAGNOSIS — R0602 Shortness of breath: Secondary | ICD-10-CM | POA: Insufficient documentation

## 2019-07-30 DIAGNOSIS — I5022 Chronic systolic (congestive) heart failure: Secondary | ICD-10-CM | POA: Insufficient documentation

## 2019-07-30 DIAGNOSIS — Z7982 Long term (current) use of aspirin: Secondary | ICD-10-CM | POA: Insufficient documentation

## 2019-07-30 DIAGNOSIS — E1122 Type 2 diabetes mellitus with diabetic chronic kidney disease: Secondary | ICD-10-CM | POA: Insufficient documentation

## 2019-07-30 DIAGNOSIS — I2510 Atherosclerotic heart disease of native coronary artery without angina pectoris: Secondary | ICD-10-CM | POA: Diagnosis not present

## 2019-07-30 DIAGNOSIS — I959 Hypotension, unspecified: Secondary | ICD-10-CM | POA: Insufficient documentation

## 2019-07-30 DIAGNOSIS — I252 Old myocardial infarction: Secondary | ICD-10-CM | POA: Insufficient documentation

## 2019-07-30 DIAGNOSIS — Z79899 Other long term (current) drug therapy: Secondary | ICD-10-CM | POA: Insufficient documentation

## 2019-07-30 DIAGNOSIS — Z87891 Personal history of nicotine dependence: Secondary | ICD-10-CM | POA: Diagnosis not present

## 2019-07-30 DIAGNOSIS — Z794 Long term (current) use of insulin: Secondary | ICD-10-CM | POA: Diagnosis not present

## 2019-07-30 DIAGNOSIS — Z8674 Personal history of sudden cardiac arrest: Secondary | ICD-10-CM | POA: Diagnosis not present

## 2019-07-30 DIAGNOSIS — I5043 Acute on chronic combined systolic (congestive) and diastolic (congestive) heart failure: Secondary | ICD-10-CM

## 2019-07-30 DIAGNOSIS — N1831 Chronic kidney disease, stage 3a: Secondary | ICD-10-CM | POA: Insufficient documentation

## 2019-07-30 LAB — BASIC METABOLIC PANEL
Anion gap: 9 (ref 5–15)
BUN: 21 mg/dL (ref 8–23)
CO2: 26 mmol/L (ref 22–32)
Calcium: 9.5 mg/dL (ref 8.9–10.3)
Chloride: 107 mmol/L (ref 98–111)
Creatinine, Ser: 1.2 mg/dL (ref 0.61–1.24)
GFR calc Af Amer: >60 mL/min (ref >60)
GFR calc non Af Amer: >60 mL/min (ref >60)
Glucose, Bld: 97 mg/dL (ref 70–99)
Potassium: 4.4 mmol/L (ref 3.5–5.1)
Sodium: 142 mmol/L (ref 135–145)

## 2019-07-30 LAB — BRAIN NATRIURETIC PEPTIDE: B Natriuretic Peptide: 249.2 pg/mL — ABNORMAL HIGH (ref 0.0–100.0)

## 2019-07-30 LAB — ECHOCARDIOGRAM COMPLETE: Weight: 2979.2 oz

## 2019-07-30 MED ORDER — JARDIANCE 10 MG PO TABS: 10.0000 mg | ORAL_TABLET | Freq: Every day | ORAL | 3 refills | Status: DC

## 2019-07-30 MED ORDER — FUROSEMIDE 40 MG PO TABS: 40.0000 mg | ORAL_TABLET | ORAL | 6 refills | Status: DC | PRN

## 2019-07-30 NOTE — Patient Instructions (Signed)
TAKE Lasix only as needed for fluid or edema.  START [name] 10mg  daily.  Routine lab work today. Will notify you of abnormal results  Echocardiogram today.  Your provider requests you have a cardiac MRI.  (they will call you to schedule appointment)  Follow up in 3 months.

## 2019-07-30 NOTE — Progress Notes (Signed)
ADVANCED HF CLINIC NOTE    HF Cardiologist: Lynnel Zanetti  HPI:  Lawrence Olson is a 68 year old male from [place] with h/o CAD, systolic HF and VF cardiac arrest.   He had no known medical problems until 04/16/19 when he experienced witnessed VF arrest at work. Had bystander CPR x 20 mins with defib and intubation in field. He was taken to cath lab for emergent catheterization. Attempts at opening LAD unsuccessful (felt to be chronic). Impella placed and he was taken to CCU. EF 25-30%. Had protracted course with respiratory failure, renal failure (requiring CVVHD) and anoxic brain injury. There was also a question of acute amio pneumonitis with ESR> 100.   Extubated 05/01/19. Mental status and renal function improved. Discharged to CIR. Diw well in CR with dramatic improvement. Leifevest placed. Creatinine plateaued at 2.4 so ACE/ARB/ARNI not started.   Here with his wife. Feeling very good. Walking 30 minutes every day. Mild SOB with walking. No CP. At last visit hydral/isosorbide stopped due to severe hypotension. No shocks from [place] driving him crazy wants to get off.    ROS: All systems negative except as listed in HPI, PMH and Problem List.  SH:  Social History   Socioeconomic History  . Marital status: Married    Spouse name: Not on file  . Number of children: Not on file  . Years of education: Not on file  . Highest education level: Not on file  Occupational History  . Not on file  Social Needs  . Financial resource strain: Not hard at all  . Food insecurity    Worry: Never true    Inability: Never true  . Transportation needs    Medical: No    Non-medical: No  Tobacco Use  . Smoking status: Former Research scientist (life sciences)  . Smokeless tobacco: Never Used  Substance and Sexual Activity  . Alcohol use: Yes    Comment: rarely  . Drug use: Never  . Sexual activity: Not Currently  Lifestyle  . Physical activity    Days per week: 3 days    Minutes per session: 30 min  .  Stress: Not at all  Relationships  . Social Herbalist on phone: Three times a week    Gets together: Twice a week    Attends religious service: Never    Active member of club or organization: No    Attends meetings of clubs or organizations: Never    Relationship status: Patient refused  . Intimate partner violence    Fear of current or ex partner: No    Emotionally abused: No    Physically abused: No    Forced sexual activity: No  Other Topics Concern  . Not on file  Social History Narrative  . Not on file    FH:  Family History  Problem Relation Age of Onset  . Healthy Brother   . Healthy Sister     Past Medical History:  Diagnosis Date  . Anoxic brain injury ([place])   . Jaundice    needed hospitalization in [place]  . Myocardial infarction [facility])     Current Outpatient Medications  Medication Sig Dispense Refill  . acetaminophen (TYLENOL) 325 MG tablet Take 1-2 tablets (325-650 mg total) by mouth every 4 (four) hours as needed for mild pain.    Marland Kitchen aspirin 81 MG chewable tablet Chew 1 tablet (81 mg total) by mouth daily.    Marland Kitchen atorvastatin (LIPITOR) 80 MG tablet Take 1 tablet (  80 mg total) by mouth daily at 6 PM. 30 tablet 3  . B Complex-C (B-COMPLEX WITH VITAMIN C) tablet Take 1 tablet by mouth daily.    . blood glucose meter kit and supplies KIT Dispense based on patient and insurance preference. Use up to four times daily as directed. (FOR ICD-9 250.00, 250.01). 1 each 0  . carvedilol (COREG) 6.25 MG tablet Take 1 tablet (6.25 mg total) by mouth 2 (two) times daily with a meal. 180 tablet 3  . famotidine (PEPCID) 20 MG tablet Take 1 tablet (20 mg total) by mouth daily. 30 tablet 0  . furosemide (LASIX) 40 MG tablet Take 1 tablet (40 mg total) by mouth as directed. Take every day except for Saturday and Sunday 75 tablet 6  . Insulin Degludec (TRESIBA) 100 UNIT/ML SOLN Inject 15 Units into the skin 2 (two) times daily. 50 mL 1  . Insulin Syringes, Disposable,  U-100 0.5 ML MISC 1 application by Does not apply route 2 (two) times daily. 100 each 0  . Lancet Device MISC 1 Units by Does not apply route daily before breakfast. 90 each 0  . ONETOUCH ULTRA test strip USE TO CHECK BLOOD GLUCOSE UP TO FOUR TIMES DAILY AS DIRECTED 100 strip 1  . sacubitril-valsartan (ENTRESTO) 24-26 MG Take 1 tablet by mouth 2 (two) times daily. 60 tablet 6  . spironolactone (ALDACTONE) 25 MG tablet Take 1 tablet (25 mg total) by mouth daily. 90 tablet 3  . tamsulosin (FLOMAX) 0.4 MG CAPS capsule TAKE 1 CAPSULE(0.4 MG) BY MOUTH DAILY AFTER SUPPER 90 capsule 1   No current facility-administered medications for this encounter.     Vitals:   07/30/19 0956  BP: 100/66  Pulse: (!) 59  SpO2: 98%  Weight: 84.5 kg (186 lb 3.2 oz)    PHYSICAL EXAM:  General:  Well appearing. No resp difficulty HEENT: normal Neck: supple. no JVD. Carotids 2+ bilat; no bruits. No lymphadenopathy or thryomegaly appreciated. Cor: PMI nondisplaced. Regular rate & rhythm. No rubs, gallops or murmurs. Lungs: clear Abdomen: soft, nontender, nondistended. No hepatosplenomegaly. No bruits or masses. Good bowel sounds. Extremities: no cyanosis, clubbing, rash, edema Neuro: alert & orientedx3, cranial nerves grossly intact. moves all 4 extremities w/o difficulty. Affect pleasant  Ecg: Sinus brady 54 septal Qs. Personally reviewed   ASSESSMENT & PLAN:  1. Chronic systolic HF - Echo 0/13 EF 25-30% in setting of cardiac arrest and severe iCM - Much improved - NYHA II. Volume status ok - Continue  carvedilol to 6.25 bid - Continue spiro 12.5 daily  - Continue Entresto 24/26 bid -  Add [name] 10  - Change lasix to prn only - Check echo. If EF <= 35% will need ICD  2. CAD s/p recent anterior MI  - treated medically as unable to open LAD CTO - Stable no ischemia. Will review films with interventional team - Continue ASA/statin/imdur - Will get cMRI to look for viability  3. H/o VF arrest  - continue Lifevest for now - Check echo. If EF <= 35% will need ICD  4. DM2  - PCP managing - Add [name]  5. CKD 3a - last creatinine 1.4 - repeat today    Glori Bickers, MD  10:06 AM

## 2019-07-30 NOTE — Progress Notes (Signed)
  Echocardiogram 2D Echocardiogram has been performed.  Lawrence Olson G Misheel Gowans 07/30/2019, 12:20 PM

## 2019-07-31 ENCOUNTER — Other Ambulatory Visit (HOSPITAL_COMMUNITY): Payer: Self-pay | Admitting: *Deleted

## 2019-07-31 DIAGNOSIS — I5022 Chronic systolic (congestive) heart failure: Secondary | ICD-10-CM

## 2019-08-09 ENCOUNTER — Ambulatory Visit (INDEPENDENT_AMBULATORY_CARE_PROVIDER_SITE_OTHER): Payer: PPO | Admitting: Cardiology

## 2019-08-09 ENCOUNTER — Other Ambulatory Visit: Payer: Self-pay

## 2019-08-09 ENCOUNTER — Encounter: Payer: Self-pay | Admitting: *Deleted

## 2019-08-09 ENCOUNTER — Encounter: Payer: Self-pay | Admitting: Cardiology

## 2019-08-09 VITALS — BP 100/66 | HR 59 | Ht 67.0 in | Wt 185.0 lb

## 2019-08-09 DIAGNOSIS — I255 Ischemic cardiomyopathy: Secondary | ICD-10-CM | POA: Diagnosis not present

## 2019-08-09 LAB — CBC
Hematocrit: 32.9 % — ABNORMAL LOW (ref 37.5–51.0)
Hemoglobin: 11.2 g/dL — ABNORMAL LOW (ref 13.0–17.7)
MCH: 31.3 pg (ref 26.6–33.0)
MCHC: 34 g/dL (ref 31.5–35.7)
MCV: 92 fL (ref 79–97)
Platelets: 404 10*3/uL (ref 150–450)
RBC: 3.58 x10E6/uL — ABNORMAL LOW (ref 4.14–5.80)
RDW: 14.3 % (ref 11.6–15.4)
WBC: 10.7 10*3/uL (ref 3.4–10.8)

## 2019-08-09 NOTE — Patient Instructions (Signed)
Medication Instructions:  Your physician recommends that you continue on your current medications as directed. Please refer to the Current Medication list given to you today.     * If you need a refill on your cardiac medications before your next appointment, please call your pharmacy. *  Labwork: Pre procedure lab work today: CBC  If you have labs (blood work) drawn today and your tests are completely normal, you will receive your results only by:  [place] (if you have MyChart) OR  A paper copy in the mail If you have any lab test that is abnormal or we need to change your treatment, we will call you to review the results.   Testing/Procedures: Your physician has recommended that you have a defibrillator inserted. An implantable cardioverter defibrillator (ICD) is a small device that is placed in your chest or, in rare cases, your abdomen. This device uses electrical pulses or shocks to help control life-threatening, irregular heartbeats that could lead the heart to suddenly stop beating (sudden cardiac arrest). Leads are attached to the ICD that goes into your heart. This is done in the hospital and usually requires an overnight stay. Please follow the instructions below, located under the special instructions section.   Follow-Up: Your physician recommends that you schedule a wound check appointment 10-14 days, after your procedure on 08/16/19, with the device clinic.  Your physician recommends that you schedule a follow up appointment in 91 days, after your procedure on 08/16/19, with Dr. Curt Bears.  * Please note that any paperwork needing to be filled out by the provider will need to be addressed at the front desk prior to seeing the provider.  Please note that any FMLA, disability or other documents regarding health condition is subject to a $25.00 charge that must be received prior to completion of paperwork in the form of a money order or check. *  Thank you for choosing [facility]!!   Trinidad Curet, RN [phone]   Any Other Special Instructions Will Be Listed Below (If Applicable).    Implantable Device Instructions  You are scheduled for: defibrillator implant on 08/16/19 with Dr. Curt Bears.  1.   Pre procedure testing-             A.  LAB WORK--- On 08/09/19.  You do not need to be fasting.               B. COVID TEST-- On 08/13/19 @ 8:00 am - You will go to [facility] hospital ([place]) for your Covid testing.   This is a drive thru test site.  There will be multiple testing areas.  Be sure to share with the first checkpoint that you are there for pre-procedure/surgery testing. This will put you into the right (yellow) lane that leads to the PAT testing team.   Stay in your car and the nurse team will come to your car to test you.  After you are tested please go home and self quarantine until the day of your procedure.    2. On the day of your procedure 08/16/19 you will go to [facility] [phone]) at 8:30 am.  Dennis Bast will go to the main entrance [address] [employer]) and enter where the [employer] are.  You will check in at ADMITTING.  You may have one support person come in to the hospital with you.  They will be asked to wait in the waiting room.  3.   Do not eat or drink after midnight prior to your procedure.   4.   On the morning of your procedure do NOT take any medication.  5.  The night before your procedure and the morning of your procedure scrub your neck/chest with surgical scrub.  An instruction letter is included with this letter.   5.  Plan for an overnight stay.  If you use your phone frequently bring your phone charger.  When you are discharged you will need someone to drive you home.   6.  You will follow up with the [facility] 10-14 days after your procedure. You will follow up with Dr. Curt Bears 91 days after your procedure.  These appointments will be made for you.   * If  you have ANY questions after you get home, please call the office [phone] and ask for Dawt Reeb RN or send a MyChart message.   [place] - Preparing For Surgery  Before surgery, you can play an important role. Because skin is not sterile, your skin needs to be as free of germs as possible. You can reduce the number of germs on your skin by washing with CHG (chlorahexidine gluconate) Soap before surgery.  CHG is an antiseptic cleaner which kills germs and bonds with the skin to continue killing germs even after washing.   Please do not use if you have an allergy to CHG or antibacterial soaps.  If your skin becomes reddened/irritated stop using the CHG.   Do not shave (including legs and underarms) for at least 48 hours prior to first CHG shower.  It is OK to shave your face.  Please follow these instructions carefully:  1.  Shower the night before surgery and the morning of surgery with CHG.  2.  If you choose to wash your hair, wash your hair first as usual with your normal shampoo.  3.  After you shampoo, rinse your hair and body thoroughly to remove the shampoo.  4.  Use CHG as you would any other liquid soap.  You can apply CHG directly to the skin and wash gently with a clean washcloth. 5.  Apply the CHG Soap to your body ONLY FROM THE NECK DOWN.  Do not use on open wounds or open sores.  Avoid contact with your eyes, ears, mouth and genitals (private parts).  Wash genitals (private parts) with your normal soap.  6.  Wash thoroughly, paying special attention to the area where your surgery will be performed.  7.  Thoroughly rinse your body with warm water from the neck down.   8.  DO NOT shower/wash with your normal soap after using and rinsing off the CHG soap.  9.  Pat yourself dry with a clean towel.           10.  Wear clean pajamas.           11.  Place clean sheets on your bed the night of your first shower and do not sleep with pets.  Day of Surgery: Do not apply any  deodorants/lotions.  Please wear clean clothes to the hospital/surgery center.                   Cardioverter Defibrillator Implantation An implantable cardioverter defibrillator (ICD) is a small, lightweight, battery-powered device that is placed (implanted) under the skin in the chest or abdomen. Your caregiver may prescribe an ICD if:  You have had an irregular heart rhythm (arrhythmia)  that originated in the lower chambers of the heart (ventricles).  Your heart has been damaged by a disease (such as coronary artery disease) or heart condition (such as a heart attack). An ICD consists of a battery that lasts several years, a small computer called a pulse generator, and wires called leads that go into the heart. It is used to detect and correct two dangerous arrhythmias: a rapid heart rhythm (tachycardia) and an arrhythmia in which the ventricles contract in an uncoordinated way (fibrillation). When an ICD detects tachycardia, it sends an electrical signal to the heart that restores the heartbeat to normal (cardioversion). This signal is usually painless. If cardioversion does not work or if the ICD detects fibrillation, it delivers a small electrical shock to the heart (defibrillation) to restart the heart. The shock may feel like a strong jolt in the chest. ICDs may be programmed to correct other problems. Sometimes, ICDs are programmed to act as another type of implantable device called a pacemaker. Pacemakers are used to treat a slow heartbeat (bradycardia). LET YOUR CAREGIVER KNOW ABOUT:  Any allergies you have.  All medicines you are taking, including vitamins, herbs, eyedrops, and over-the-counter medicines and creams.  Previous problems you or members of your family have had with the use of anesthetics.  Any blood disorders you have had.  Other health problems you have. RISKS AND COMPLICATIONS Generally, the procedure to implant an ICD is safe. However, as with any surgical  procedure, complications can occur. Possible complications associated with implanting an ICD include:  Swelling, bleeding, or bruising at the site where the ICD was implanted.  Infection at the site where the ICD was implanted.  A reaction to medicine used during the procedure.  Nerve, heart, or blood vessel damage.  Blood clots. BEFORE THE PROCEDURE  You may need to have blood tests, heart tests, or a chest X-ray done before the day of the procedure.  Ask your caregiver about changing or stopping your regular medicines.  Make plans to have someone drive you home. You may need to stay in the hospital overnight after the procedure.  Stop smoking at least 24 hours before the procedure.  Take a bath or shower the night before the procedure. You may need to scrub your chest or abdomen with a special type of soap.  Do not eat or drink before your procedure for as long as directed by your caregiver. Ask if it is okay to take any needed medicine with a small sip of water. PROCEDURE  The procedure to implant an ICD in your chest or abdomen is usually done at a hospital in a room that has a large X-ray machine called a fluoroscope. The machine will be above you during the procedure. It will help your caregiver see your heart during the procedure. Implanting an ICD usually takes 1-3 hours. Before the procedure:   Small monitors will be put on your body. They will be used to check your heart, blood pressure, and oxygen level.  A needle will be put into a vein in your hand or arm. This is called an intravenous (IV) access tube. Fluids and medicine will flow directly into your body through the IV tube.  Your chest or abdomen will be cleaned with a germ-killing (antiseptic) solution. The area may be shaved.  You may be given medicine to help you relax (sedative).  You will be given a medicine called a local anesthetic. This medicine will make the surgical site numb while the ICD  is implanted.  You will be sleepy but awake during the procedure. After you are numb the procedure will begin. The caregiver will:  Make a small cut (incision). This will make a pocket deep under your skin that will hold the pulse generator.  Guide the leads through a large blood vessel into your heart and attach them to the heart muscles. Depending on the ICD, the leads may go into one ventricle or they may go to both ventricles and into an upper chamber of the heart (atrium).  Test the ICD.  Close the incision with stitches, glue, or staples. AFTER THE PROCEDURE  You may feel pain. Some pain is normal. It may last a few days.  You may stay in a recovery area until the local anesthetic has worn off. Your blood pressure and pulse will be checked often. You will be taken to a room where your heart will be monitored.  A chest X-ray will be taken. This is done to check that the cardioverter defibrillator is in the right place.  You may stay in the hospital overnight.  A slight bump may be seen over the skin where the ICD was placed. Sometimes, it is possible to feel the ICD under the skin. This is normal.  In the months and years afterward, your caregiver will check the device, the leads, and the battery every few months. Eventually, when the battery is low, the ICD will be replaced.   This information is not intended to replace advice given to you by your health care provider. Make sure you discuss any questions you have with your health care provider.   Document Released: 05/01/2002 Document Revised: 05/30/2013 Document Reviewed: 08/28/2012 Elsevier Interactive Patient Education 2016 [place] Defibrillator Implantation, Care After This sheet gives you information about how to care for yourself after your procedure. Your health care provider may also give you more specific instructions. If you have problems or questions, contact your health care provider. What can I expect after  the procedure? After the procedure, it is common to have:  Some pain. It may last a few days.  A slight bump over the skin where the device was placed. Sometimes, it is possible to feel the device under the skin. This is normal.  During the months and years after your procedure, your health care provider will check the device, the leads, and the battery every few months. Eventually, when the battery is low, the device will be replaced. Follow these instructions at home: Medicines  Take over-the-counter and prescription medicines only as told by your health care provider.  If you were prescribed an antibiotic medicine, take it as told by your health care provider. Do not stop taking the antibiotic even if you start to feel better. Incision care   Follow instructions from your health care provider about how to take care of your incision area. Make sure you: ? Wash your hands with soap and water before you change your bandage (dressing). If soap and water are not available, use hand sanitizer. ? Change your dressing as told by your health care provider. ? Leave stitches (sutures), skin glue, or adhesive strips in place. These skin closures may need to stay in place for 2 weeks or longer. If adhesive strip edges start to loosen and curl up, you may trim the loose edges. Do not remove adhesive strips completely unless your health care provider tells you to do that.  Check your incision area every day  for signs of infection. Check for: ? More redness, swelling, or pain. ? More fluid or blood. ? Warmth. ? Pus or a bad smell.  Do not use lotions or ointments near the incision area unless told by your health care provider.  Keep the incision area clean and dry for 2-3 days after the procedure or for as long as told by your health care provider. It takes several weeks for the incision site to heal completely.  Do not take baths, swim, or use a hot tub until your health care provider  approves. Activity  Try to walk a little every day. Exercising is important after this procedure. Also, use your shoulder on the side of the defibrillator in daily tasks that do not require a lot of motion.  For at least 6 weeks: ? Do not lift your upper arm above your shoulders. This means no tennis, golf, or swimming for this period of time. If you tend to sleep with your arm above your head, use a restraint to prevent this during sleep. ? Avoid sudden jerking, pulling, or chopping movements that pull your upper arm far away from your body.  Ask your health care provider when you may go back to work.  Check with your health care provider before you start to drive or play sports. Electric and magnetic fields  Tell all health care providers that you have a defibrillator. This may prevent them from giving you an MRI scan because strong magnets are used for that test.  If you must pass through a metal detector, quickly walk through it. Do not stop under the detector, and do not stand near it.  Avoid places or objects that have a strong electric or magnetic field, including: ? Airport [employer]. At the airport, let officials know that you have a defibrillator. Your defibrillator ID card will let you be checked in a way that is safe for you and will not damage your defibrillator. Also, do not let a security person wave a magnetic wand near your defibrillator. That can make it stop working. ? Power plants. ? Large electrical generators. ? Anti-theft systems or electronic article surveillance (EAS). ? Radiofrequency transmission towers, such as cell phone and radio towers.  Do not use amateur (ham) radio equipment or electric (arc) welding torches. Some devices are safe to use if held at least 12 inches (30 cm) from your defibrillator. These include power tools, lawn mowers, and speakers. If you are unsure if something is safe to use, ask your health care provider.  Do not use MP3 player  headphones. They have magnets.  You may safely use electric blankets, heating pads, computers, and microwave ovens.  When using your cell phone, hold it to the ear that is on the opposite side from the defibrillator. Do not leave your cell phone in a pocket over the defibrillator. General instructions  Follow diet instructions from your health care provider, if this applies.  Always keep your defibrillator ID card with you. The card should list the implant date, device model, and manufacturer. Consider wearing a medical alert bracelet or necklace.  Have your defibrillator checked every 3-6 months or as often as told by your health care provider. Most defibrillators last for 4-8 years.  Keep all follow-up visits as told by your health care provider. This is important for your health care provider to make sure your chest is healing the way it should. Ask your health care provider when you should come back to have  your stitches or staples taken out. Contact a health care provider if:  You feel one shock in your chest.  You gain weight suddenly.  Your legs or feet swell more than they have before.  It feels like your heart is fluttering or skipping beats (heart palpitations).  You have more redness, swelling, or pain around your incision.  You have more fluid or blood coming from your incision.  Your incision feels warm to the touch.  You have pus or a bad smell coming from your incision.  You have a fever. Get help right away if:  You have chest pain.  You feel more than one shock.  You feel more short of breath than you have felt before.  You feel more light-headed than you have felt before.  Your incision starts to open up. This information is not intended to replace advice given to you by your health care provider. Make sure you discuss any questions you have with your health care provider. Document Released: 02/26/2005 Document Revised: 02/27/2016 Document Reviewed:  01/14/2016 Elsevier Interactive Patient Education  2018 [place] Discharge Instructions for  Pacemaker/Defibrillator Patients  ACTIVITY No heavy lifting or vigorous activity with your left/right arm for 6 to 8 weeks.  Do not raise your left/right arm above your head for one week.  Gradually raise your affected arm as drawn below.           __  NO DRIVING for     ; you may begin driving on     .  WOUND CARE - Keep the wound area clean and dry.  Do not get this area wet for one week. No showers for one week; you may shower on     . - The tape/steri-strips on your wound will fall off; do not pull them off.  No bandage is needed on the site.  DO  NOT apply any creams, oils, or ointments to the wound area. - If you notice any drainage or discharge from the wound, any swelling or bruising at the site, or you develop a fever > 101? F after you are discharged home, call the office at once.  SPECIAL INSTRUCTIONS - You are still able to use cellular telephones; use the ear opposite the side where you have your pacemaker/defibrillator.  Avoid carrying your cellular phone near your device. - When traveling through airports, show security personnel your identification card to avoid being screened in the metal detectors.  Ask the security personnel to use the hand wand. - Avoid arc welding equipment, MRI testing (magnetic resonance imaging), TENS units (transcutaneous nerve stimulators).  Call the office for questions about other devices. - Avoid electrical appliances that are in poor condition or are not properly grounded. - Microwave ovens are safe to be near or to operate.  ADDITIONAL INFORMATION FOR DEFIBRILLATOR PATIENTS SHOULD YOUR DEVICE GO OFF: - If your device goes off ONCE and you feel fine afterward, notify the device clinic nurses. - If your device goes off ONCE and you do not feel well afterward, call 911. - If your device goes off TWICE, call 911. - If your  device goes off [place], call 911.  DO NOT DRIVE YOURSELF OR A FAMILY MEMBER WITH A DEFIBRILLATOR TO THE HOSPITAL--CALL 911.

## 2019-08-09 NOTE — Progress Notes (Signed)
Electrophysiology Office Note   Date:  08/09/2019   ID:  Lawrence Olson, DOB Dec 27, 1950, MRN [id number]  PCP:  [name], NP  Cardiologist:  Bensimhon Primary Electrophysiologist:  Lawrence Gardiner Meredith Leeds, Lawrence Olson    Chief Complaint: CHF   History of Present Illness: Lawrence Olson is a 68 y.o. male who is being seen today for the evaluation of CHF at the request of Bensimhon, Shaune Pascal, Lawrence Olson. Presenting today for electrophysiology evaluation.  He has a history of coronary artery disease, chronic systolic heart failure, and VF arrest.  On 04/16/2019, he experienced a witnessed VF arrest while at work.  He had bystander CPR for 20 minutes.  He had an emergent heart cath, but his LAD was occluded and not able to be reopened.  Impella was placed and he was taken to the CCU.  Ejection fraction at that time was 25 to 30%.  He had a protracted course with respiratory failure, renal failure, and anoxic brain injury.  He Is currently wearing a LifeVest.  He is walking up to 30 minutes a day with mild shortness of breath.  Today, he denies symptoms of palpitations, chest pain, shortness of breath, orthopnea, PND, lower extremity edema, claudication, dizziness, presyncope, syncope, bleeding, or neurologic sequela. The patient is tolerating medications without difficulties.    Past Medical History:  Diagnosis Date  . Anoxic brain injury ([place])   . Jaundice    needed hospitalization in [place]  . Myocardial infarction [facility])    Past Surgical History:  Procedure Laterality Date  . RIGHT HEART CATH AND CORONARY ANGIOGRAPHY N/A 04/16/2019   Procedure: RIGHT HEART CATH AND CORONARY ANGIOGRAPHY;  Surgeon: Troy Sine, Lawrence Olson;  Location: [place] [facility];  Service: Cardiovascular;  Laterality: N/A;  . VENTRICULAR ASSIST DEVICE INSERTION N/A 04/16/2019   Procedure: VENTRICULAR ASSIST DEVICE INSERTION;  Surgeon: Troy Sine, Lawrence Olson;  Location: [place] [facility];  Service: Cardiovascular;  Laterality: N/A;      Current Outpatient Medications  Medication Sig Dispense Refill  . acetaminophen (TYLENOL) 325 MG tablet Take 1-2 tablets (325-650 mg total) by mouth every 4 (four) hours as needed for mild pain.    Marland Kitchen aspirin 81 MG chewable tablet Chew 1 tablet (81 mg total) by mouth daily.    Marland Kitchen atorvastatin (LIPITOR) 80 MG tablet Take 1 tablet (80 mg total) by mouth daily at 6 PM. 30 tablet 3  . B Complex-C (B-COMPLEX WITH VITAMIN C) tablet Take 1 tablet by mouth daily.    . blood glucose meter kit and supplies KIT Dispense based on patient and insurance preference. Use up to four times daily as directed. (FOR ICD-9 250.00, 250.01). 1 each 0  . carvedilol (COREG) 6.25 MG tablet Take 1 tablet (6.25 mg total) by mouth 2 (two) times daily with a meal. 180 tablet 3  . empagliflozin ([name]) 10 MG TABS tablet Take 10 mg by mouth daily before breakfast. 30 tablet 3  . famotidine (PEPCID) 20 MG tablet Take 1 tablet (20 mg total) by mouth daily. 30 tablet 0  . furosemide (LASIX) 40 MG tablet Take 1 tablet (40 mg total) by mouth as needed for fluid. Take every day except for Saturday and Sunday 75 tablet 6  . Insulin Degludec (TRESIBA) 100 UNIT/ML SOLN Inject 15 Units into the skin 2 (two) times daily. 50 mL 1  . Insulin Syringes, Disposable, U-100 0.5 ML MISC 1 application by Does not apply route 2 (two) times daily. 100 each 0  . Lancet  Device MISC 1 Units by Does not apply route daily before breakfast. 90 each 0  . ONETOUCH ULTRA test strip USE TO CHECK BLOOD GLUCOSE UP TO FOUR TIMES DAILY AS DIRECTED 100 strip 1  . sacubitril-valsartan (ENTRESTO) 24-26 MG Take 1 tablet by mouth 2 (two) times daily. 60 tablet 6  . spironolactone (ALDACTONE) 25 MG tablet Take 1 tablet (25 mg total) by mouth daily. 90 tablet 3  . tamsulosin (FLOMAX) 0.4 MG CAPS capsule TAKE 1 CAPSULE(0.4 MG) BY MOUTH DAILY AFTER SUPPER 90 capsule 1   No current facility-administered medications for this visit.    Allergies:   Patient has no  known allergies.   Social History:  The patient  reports that he has quit smoking. He has never used smokeless tobacco. He reports current alcohol use. He reports that he does not use drugs.   Family History:  The patient's family history includes Healthy in his brother and sister.    ROS:  Please see the history of present illness.   Otherwise, review of systems is positive for none.   All other systems are reviewed and negative.    PHYSICAL EXAM: VS:  BP 100/66   Pulse (!) 59   Ht '5\' 7"'  (1.702 m)   Wt 185 lb (83.9 kg)   SpO2 99%   BMI 28.98 kg/m  , BMI Body mass index is 28.98 kg/m. GEN: Well nourished, well developed, in no acute distress  HEENT: normal  Neck: no JVD, carotid bruits, or masses Cardiac: RRR; no murmurs, rubs, or gallops,no edema  Respiratory:  clear to auscultation bilaterally, normal work of breathing GI: soft, nontender, nondistended, + BS MS: no deformity or atrophy  Skin: warm and dry Neuro:  Strength and sensation are intact Psych: euthymic mood, full affect  EKG:  EKG is not ordered today. Personal review of the ekg ordered 07/30/19 shows sinus rhythm, rate 54, septal infarct  Recent Labs: 05/11/2019: Magnesium 1.9 06/08/2019: ALT 33; Hemoglobin 10.0; Platelets 444 07/30/2019: B Natriuretic Peptide 249.2; BUN 21; Creatinine, Ser 1.20; Potassium 4.4; Sodium 142    Lipid Panel     Component Value Date/Time   CHOL 119 06/08/2019 0908   TRIG 106 06/08/2019 0908   HDL 45 06/08/2019 0908   CHOLHDL 2.6 06/08/2019 0908   CHOLHDL 3.7 04/16/2019 1335   VLDL 20 04/16/2019 1335   LDLCALC 54 06/08/2019 0908     Wt Readings from Last 3 Encounters:  08/09/19 185 lb (83.9 kg)  07/30/19 186 lb 3.2 oz (84.5 kg)  07/05/19 178 lb 3.2 oz (80.8 kg)      Other studies Reviewed: Additional studies/ records that were reviewed today include: TTE 07/30/19  Review of the above records today demonstrates:   1. Left ventricular ejection fraction, by visual  estimation, is 30 to 35%. The left ventricle has moderate to severely decreased function. There is moderately increased left ventricular hypertrophy.  2. Elevated left atrial and left ventricular end-diastolic pressures.  3. Left ventricular diastolic parameters are consistent with Grade I diastolic dysfunction (impaired relaxation).  4. The left ventricle demonstrates global hypokinesis.  5. There is a false tendon in the left ventricle.  6. Global right ventricle has normal systolic function.The right ventricular size is normal. No increase in right ventricular wall thickness.  7. Left atrial size was mildly dilated.  8. Right atrial size was normal.  9. The mitral valve is normal in structure. No evidence of mitral valve regurgitation. No evidence of mitral stenosis. 10.  The tricuspid valve is normal in structure. Tricuspid valve regurgitation is mild. 11. The aortic valve is normal in structure. Aortic valve regurgitation is mild. No evidence of aortic valve sclerosis or stenosis. 12. The pulmonic valve was normal in structure. Pulmonic valve regurgitation is not visualized. 13. The inferior vena cava is normal in size with greater than 50% respiratory variability, suggesting right atrial pressure of 3 mmHg. 14. Since the last study on 04/24/2019 LVEF has improved from 25-30% to 30-35%. Cardiac MRI is recommended for further evaluation.   ASSESSMENT AND PLAN:  1.  Chronic systolic heart failure due to ischemic cardiomyopathy: Currently on carvedilol, Aldactone, Entresto, [name].  His ejection fraction remains low and thus he Lawrence Olson qualify for an ICD.  Risks and benefits were discussed which include bleeding, infection, tamponade, pneumothorax.  He understands these risks and is agreed to the procedure.  2.  VF arrest: Wearing a LifeVest.  Lawrence Olson need an ICD.  3.  Coronary artery disease status post anterior MI: Unable to open the LAD CTO.  No acute ischemia.  Plan per primary  cardiology.  Case discussed with referring cardiologist  Current medicines are reviewed at length with the patient today.   The patient does not have concerns regarding his medicines.  The following changes were made today:  none  Labs/ tests ordered today include:  Orders Placed This Encounter  Procedures  . CBC     Disposition:   FU with Schon Zeiders 3 months  Signed, Shamel Germond Meredith Leeds, Lawrence Olson  08/09/2019 10:35 AM     [facility] [address] [place] [address] [phone] (office) [phone] (fax)

## 2019-08-13 ENCOUNTER — Other Ambulatory Visit (HOSPITAL_COMMUNITY)
Admission: RE | Admit: 2019-08-13 | Discharge: 2019-08-13 | Disposition: A | Discharge status: Home / Self Care | Payer: PPO | Source: Ambulatory Visit | Attending: Cardiology | Admitting: Cardiology

## 2019-08-13 DIAGNOSIS — Z01812 Encounter for preprocedural laboratory examination: Secondary | ICD-10-CM | POA: Insufficient documentation

## 2019-08-13 DIAGNOSIS — Z20828 Contact with and (suspected) exposure to other viral communicable diseases: Secondary | ICD-10-CM | POA: Insufficient documentation

## 2019-08-14 LAB — NOVEL CORONAVIRUS, NAA (HOSP ORDER, SEND-OUT TO REF LAB; TAT 18-24 HRS): SARS-CoV-2, NAA: NOT DETECTED

## 2019-08-16 ENCOUNTER — Ambulatory Visit (HOSPITAL_COMMUNITY)
Admission: RE | Admit: 2019-08-16 | Discharge: 2019-08-16 | Disposition: A | Discharge status: Home / Self Care | Payer: PPO | Attending: Cardiology | Admitting: Cardiology

## 2019-08-16 ENCOUNTER — Ambulatory Visit (HOSPITAL_COMMUNITY)
Admission: RE | Disposition: A | Discharge status: Home / Self Care | Payer: PPO | Source: Home / Self Care | Attending: Cardiology

## 2019-08-16 ENCOUNTER — Ambulatory Visit (HOSPITAL_COMMUNITY): Payer: PPO

## 2019-08-16 ENCOUNTER — Other Ambulatory Visit: Payer: Self-pay

## 2019-08-16 DIAGNOSIS — Z95818 Presence of other cardiac implants and grafts: Secondary | ICD-10-CM

## 2019-08-16 DIAGNOSIS — R918 Other nonspecific abnormal finding of lung field: Secondary | ICD-10-CM | POA: Diagnosis not present

## 2019-08-16 DIAGNOSIS — I252 Old myocardial infarction: Secondary | ICD-10-CM | POA: Diagnosis not present

## 2019-08-16 DIAGNOSIS — I255 Ischemic cardiomyopathy: Secondary | ICD-10-CM | POA: Insufficient documentation

## 2019-08-16 DIAGNOSIS — I2510 Atherosclerotic heart disease of native coronary artery without angina pectoris: Secondary | ICD-10-CM | POA: Insufficient documentation

## 2019-08-16 DIAGNOSIS — Z87891 Personal history of nicotine dependence: Secondary | ICD-10-CM | POA: Insufficient documentation

## 2019-08-16 DIAGNOSIS — Z006 Encounter for examination for normal comparison and control in clinical research program: Secondary | ICD-10-CM | POA: Insufficient documentation

## 2019-08-16 DIAGNOSIS — Z950 Presence of cardiac pacemaker: Secondary | ICD-10-CM | POA: Diagnosis not present

## 2019-08-16 DIAGNOSIS — Z79899 Other long term (current) drug therapy: Secondary | ICD-10-CM | POA: Diagnosis not present

## 2019-08-16 DIAGNOSIS — I5022 Chronic systolic (congestive) heart failure: Secondary | ICD-10-CM | POA: Diagnosis not present

## 2019-08-16 HISTORY — PX: ICD IMPLANT: EP1208

## 2019-08-16 LAB — GLUCOSE, CAPILLARY: Glucose-Capillary: 78 mg/dL (ref 70–99)

## 2019-08-16 SURGERY — ICD IMPLANT

## 2019-08-16 MED ORDER — FENTANYL CITRATE (PF) 100 MCG/2ML IJ SOLN
INTRAMUSCULAR | Status: AC
  Filled 2019-08-16: qty 2

## 2019-08-16 MED ORDER — CEFAZOLIN SODIUM-DEXTROSE 1-4 GM/50ML-% IV SOLN: 1.0000 g | Freq: Four times a day (QID) | INTRAVENOUS | Status: DC

## 2019-08-16 MED ORDER — CEFAZOLIN SODIUM-DEXTROSE 2-4 GM/100ML-% IV SOLN
INTRAVENOUS | Status: AC
  Filled 2019-08-16: qty 100

## 2019-08-16 MED ORDER — MIDAZOLAM HCL 5 MG/5ML IJ SOLN
INTRAMUSCULAR | Status: DC | PRN
  Administered 2019-08-16: 1 mg via INTRAVENOUS

## 2019-08-16 MED ORDER — MIDAZOLAM HCL 5 MG/5ML IJ SOLN
INTRAMUSCULAR | Status: AC
  Filled 2019-08-16: qty 5

## 2019-08-16 MED ORDER — CEFAZOLIN SODIUM-DEXTROSE 2-4 GM/100ML-% IV SOLN
2.0000 g | INTRAVENOUS | Status: AC
  Administered 2019-08-16: 11:00:00 2 g via INTRAVENOUS

## 2019-08-16 MED ORDER — HEPARIN (PORCINE) IN NACL 1000-0.9 UT/500ML-% IV SOLN
INTRAVENOUS | Status: AC
  Filled 2019-08-16: qty 500

## 2019-08-16 MED ORDER — LIDOCAINE HCL 1 % IJ SOLN
INTRAMUSCULAR | Status: AC
  Filled 2019-08-16: qty 60

## 2019-08-16 MED ORDER — LIDOCAINE HCL (PF) 1 % IJ SOLN
INTRAMUSCULAR | Status: DC | PRN
  Administered 2019-08-16: 60 mL

## 2019-08-16 MED ORDER — FENTANYL CITRATE (PF) 100 MCG/2ML IJ SOLN
INTRAMUSCULAR | Status: DC | PRN
  Administered 2019-08-16: 25 ug via INTRAVENOUS

## 2019-08-16 MED ORDER — ONDANSETRON HCL 4 MG/2ML IJ SOLN: 4.0000 mg | Freq: Four times a day (QID) | INTRAMUSCULAR | Status: DC | PRN

## 2019-08-16 MED ORDER — SODIUM CHLORIDE 0.9 % IV SOLN: INTRAVENOUS | Status: DC

## 2019-08-16 MED ORDER — ACETAMINOPHEN 325 MG PO TABS: 325.0000 mg | ORAL_TABLET | ORAL | Status: DC | PRN

## 2019-08-16 MED ORDER — HEPARIN (PORCINE) IN NACL 1000-0.9 UT/500ML-% IV SOLN
INTRAVENOUS | Status: DC | PRN
  Administered 2019-08-16: 500 mL

## 2019-08-16 MED ORDER — CHLORHEXIDINE GLUCONATE 4 % EX LIQD: 4.0000 "application " | Freq: Once | CUTANEOUS | Status: DC

## 2019-08-16 MED ORDER — SODIUM CHLORIDE 0.9 % IV SOLN
INTRAVENOUS | Status: AC
  Filled 2019-08-16: qty 2

## 2019-08-16 MED ORDER — SODIUM CHLORIDE 0.9 % IV SOLN
80.0000 mg | INTRAVENOUS | Status: AC
  Administered 2019-08-16: 80 mg

## 2019-08-16 SURGICAL SUPPLY — 7 items
CABLE SURGICAL S-101-97-12 (CABLE) ×3 IMPLANT
ICD VISIA MRI VR DVFB1D4 (ICD Generator) ×1 IMPLANT
LEAD SPRINT QUAT SEC 6935M-62 (Lead) ×3 IMPLANT
PAD PRO RADIOLUCENT 2001M-C (PAD) ×3 IMPLANT
SHEATH 9FR PRELUDE SNAP 13 (SHEATH) ×3 IMPLANT
TRAY PACEMAKER INSERTION (PACKS) ×3 IMPLANT
VISIA MRI VR DVFB1D4 (ICD Generator) ×3 IMPLANT

## 2019-08-16 NOTE — Discharge Instructions (Signed)
Supplemental Discharge Instructions for  Pacemaker/Defibrillator Patients  Tomorrow 08/17/2019, PLEASE SEND A REMOTE DEVICE TRANSMISSION    Activity No heavy lifting or vigorous activity with your left arm for 6 to 8 weeks.  Do not raise your left arm above your head for one week.  Gradually raise your affected arm as drawn below.              08/20/2019              08/21/2019              08/22/2019            08/23/2019 __  NO DRIVING for 6 months post arrest (04/16/2019) and cleared by your cardiologist .  [place] the wound area clean and dry.  Do not get this area wet, no showers until cleared to at your wound check visit . - Remove arm sling tomorrow 08/17/2019 - Remove outer plastic bandage tomorrow 08/17/2019, there are steri-strips (paper tapes) underneath, DO NOT remove these - The tape/steri-strips on your wound will fall off; do not pull them off.  No bandage is needed on the site.  DO  NOT apply any creams, oils, or ointments to the wound area. - If you notice any drainage or discharge from the wound, any swelling or bruising at the site, or you develop a fever > 101? F after you are discharged home, call the office at once.  Special Instructions - You are still able to use cellular telephones; use the ear opposite the side where you have your pacemaker/defibrillator.  Avoid carrying your cellular phone near your device. - When traveling through airports, show security personnel your identification card to avoid being screened in the metal detectors.  Ask the security personnel to use the hand wand. - Avoid arc welding equipment, MRI testing (magnetic resonance imaging), TENS units (transcutaneous nerve stimulators).  Call the office for questions about other devices. - Avoid electrical appliances that are in poor condition or are not properly grounded. - Microwave ovens are safe to be near or to operate.  Additional information for defibrillator patients  should your device go off: - If your device goes off ONCE and you feel fine afterward, notify the device clinic nurses. - If your device goes off ONCE and you do not feel well afterward, call 911. - If your device goes off TWICE, call 911. - If your device goes off THREE times in one day, call 911.  DO NOT DRIVE YOURSELF OR A FAMILY MEMBER WITH A DEFIBRILLATOR TO THE HOSPITAL--CALL 911.      Cardioverter Defibrillator Implantation, Care After This sheet gives you information about how to care for yourself after your procedure. Your health care provider may also give you more specific instructions. If you have problems or questions, contact your health care provider. What can I expect after the procedure? After the procedure, it is common to have:  Some pain. It may last a few days.  A slight bump over the skin where the device was placed. Sometimes, it is possible to feel the device under the skin. This is normal.  During the months and years after your procedure, your health care provider will check the device, the leads, and the battery every few months. Eventually, when the battery is low, the device will be replaced.  You should receive your defibrillator ID card for your new device in the next 4-8 weeks.  Follow these instructions at  home: Medicines  Take over-the-counter and prescription medicines only as told by your health care provider.  If you were prescribed an antibiotic medicine, take it as told by your health care provider. Do not stop taking the antibiotic even if you start to feel better. Incision care        Follow instructions from your health care provider about how to take care of your incision area. Make sure you: ? Leave stitches (sutures), skin glue, or adhesive strips in place. These skin closures may need to stay in place for 2 weeks or longer. If adhesive strip edges start to loosen and curl up, you may trim the loose edges. Do not remove adhesive  strips completely unless your health care provider tells you to do that.  Check your incision area every day for signs of infection. Check for: ? More redness, swelling, or pain. ? More fluid or blood. ? Warmth. ? Pus or a bad smell.  Do not use lotions or ointments near the incision area unless told by your health care provider.  Keep the incision area clean and dry for 7 days after the procedure or for as long as told by your health care provider. It takes several weeks for the incision site to heal completely.  Do not take baths, swim, or use a hot tub until your health care provider approves. Activity  Try to walk a little every day. Exercising is important after this procedure. Also, use your shoulder on the side of the defibrillator in daily tasks that do not require a lot of motion.  For at least 1 week: ? Do not lift your upper arm above your shoulders. This means no tennis, golf, or swimming for this period of time. If you tend to sleep with your arm above your head, use a restraint to prevent this during sleep.  For at least 6 weeks: ? Avoid sudden jerking, pulling, or chopping movements that pull your upper arm far away from your body.  Ask your health care provider when you may go back to work.  Check with your health care provider before you start to drive or play sports. Electric and magnetic fields  Tell all health care providers that you have a defibrillator. This may prevent them from giving you an MRI scan because strong magnets are used for that test.  If you must pass through a metal detector, quickly walk through it. Do not stop under the detector, and do not stand near it.  Avoid places or objects that have a strong electric or magnetic field, including: ? Airport Herbalist. At the airport, let officials know that you have a defibrillator. Your defibrillator ID card will let you be checked in a way that is safe for you and will not damage your  defibrillator. Also, do not let a security person wave a magnetic wand near your defibrillator. That can make it stop working. ? Power plants. ? Large electrical generators. ? Anti-theft systems or electronic article surveillance (EAS). ? Radiofrequency transmission towers, such as cell phone and radio towers.  Do not use amateur (ham) radio equipment or electric (arc) welding torches. Some devices are safe to use if held at least 12 inches (30 cm) from your defibrillator. These include power tools, lawn mowers, and speakers. If you are unsure if something is safe to use, ask your health care provider.  Do not use MP3 player headphones. They have magnets.  You may safely use electric blankets, heating pads, computers,  and microwave ovens.  When using your cell phone, hold it to the ear that is on the opposite side from the defibrillator. Do not leave your cell phone in a pocket over the defibrillator. General instructions  Follow diet instructions from your health care provider, if this applies.  Always keep your defibrillator ID card with you. The card should list the implant date, device model, and manufacturer. Consider wearing a medical alert bracelet or necklace.  Have your defibrillator checked every 3-6 months or as often as told by your health care provider. Most defibrillators last for 4-8 years.  Keep all follow-up visits as told by your health care provider. This is important for your health care provider to make sure your chest is healing the way it should. Ask your health care provider when you should come back to have your stitches or staples taken out. Contact a health care provider if:  You gain weight suddenly.  Your legs or feet swell more than they have before.  It feels like your heart is fluttering or skipping beats (heart palpitations).  You have more redness, swelling, or pain around your incision.  You have more fluid or blood coming from your incision.  Your  incision feels warm to the touch.  You have pus or a bad smell coming from your incision.  You have a fever. Get help right away if:  You have chest pain.  You feel more than one shock.  You feel more short of breath than you have felt before.  You feel more light-headed than you have felt before.  Your incision starts to open up. This information is not intended to replace advice given to you by your health care provider. Make sure you discuss any questions you have with your health care provider.

## 2019-08-16 NOTE — H&P (Signed)
ICD Criteria  Current LVEF:30-35%. Within 12 months prior to implant: Yes   Heart failure history: Yes, Class II  Cardiomyopathy history: Yes, Ischemic Cardiomyopathy - Prior MI.  Atrial Fibrillation/Atrial Flutter: No.  Ventricular tachycardia history: No.  Cardiac arrest history: Yes, Ventricular Fibrillation.  History of syndromes with risk of sudden death: No.  Previous ICD: No.  Current ICD indication: Primary  PPM indication: No.  Class I or II Bradycardia indication present: No  Beta Blocker therapy for 3 or more months: Yes, prescribed.   Ace Inhibitor/ARB therapy for 3 or more months: Yes, prescribed.    I have seen Lawrence Olson is a 68 y.o. malepre-procedural and has been referred by Bensimhon for consideration of ICD implant for primary prevention of sudden death.  The patient's chart has been reviewed and they meet criteria for ICD implant.  I have had a thorough discussion with the patient reviewing options.  The patient and their family (if available) have had opportunities to ask questions and have them answered. The patient and I have decided together through the [place] [employer] to implant ICD at this time.  Risks, benefits, alternatives to ICD implantation were discussed in detail with the patient today. The patient  understands that the risks include but are not limited to bleeding, infection, pneumothorax, perforation, tamponade, vascular damage, renal failure, MI, stroke, death, inappropriate shocks, and lead dislodgement and  wishes to proceed.

## 2019-08-16 NOTE — Progress Notes (Signed)
Left area for cxr

## 2019-08-17 ENCOUNTER — Other Ambulatory Visit: Payer: Self-pay | Admitting: Registered Nurse

## 2019-08-20 MED FILL — Lidocaine HCl Local Inj 1%: INTRAMUSCULAR | Qty: 60 | Status: AC

## 2019-08-21 ENCOUNTER — Encounter: Payer: Self-pay | Admitting: *Deleted

## 2019-08-31 ENCOUNTER — Encounter: Payer: PPO | Admitting: Physician Assistant

## 2019-09-03 ENCOUNTER — Other Ambulatory Visit: Payer: Self-pay

## 2019-09-03 ENCOUNTER — Ambulatory Visit (INDEPENDENT_AMBULATORY_CARE_PROVIDER_SITE_OTHER): Payer: PPO | Admitting: Nurse Practitioner

## 2019-09-03 DIAGNOSIS — I469 Cardiac arrest, cause unspecified: Secondary | ICD-10-CM

## 2019-09-03 DIAGNOSIS — I5022 Chronic systolic (congestive) heart failure: Secondary | ICD-10-CM

## 2019-09-03 NOTE — Progress Notes (Signed)
ICD wound check appointment. Wound well healed. Normal device function. No changes today. Follow up with Dr Curt Bears as scheduled.  Chanetta Marshall, NP 09/03/2019 12:54 PM

## 2019-09-07 ENCOUNTER — Ambulatory Visit (INDEPENDENT_AMBULATORY_CARE_PROVIDER_SITE_OTHER): Payer: PPO | Admitting: Registered Nurse

## 2019-09-07 ENCOUNTER — Encounter: Payer: Self-pay | Admitting: Registered Nurse

## 2019-09-07 ENCOUNTER — Other Ambulatory Visit: Payer: Self-pay

## 2019-09-07 VITALS — BP 111/67 | HR 60 | Temp 98.6°F | Ht 67.0 in | Wt 187.0 lb

## 2019-09-07 DIAGNOSIS — E1165 Type 2 diabetes mellitus with hyperglycemia: Secondary | ICD-10-CM | POA: Diagnosis not present

## 2019-09-07 DIAGNOSIS — Z13220 Encounter for screening for lipoid disorders: Secondary | ICD-10-CM | POA: Diagnosis not present

## 2019-09-07 DIAGNOSIS — I252 Old myocardial infarction: Secondary | ICD-10-CM | POA: Diagnosis not present

## 2019-09-07 LAB — POCT GLYCOSYLATED HEMOGLOBIN (HGB A1C): Hemoglobin A1C: 5.9 % — AB (ref 4.0–5.6)

## 2019-09-07 NOTE — Progress Notes (Signed)
Established Patient Office Visit  Subjective:  Patient ID: Lawrence Olson, male    DOB: 06-08-1951  Age: 69 y.o. [name] [id number]  CC:  Chief Complaint  Patient presents with  . cardio f/u appt  . Diabetes    running a bit low     HPI Lawrence Olson presents for follow up.  Was seen by our office post STEMI at work - has since been worked up by cardiology and had ICD placed. No concerns. Has followed up with cardiology post procedure and all is going well. Here today to follow up on labs, 3 mo follow up. Some concerns for blood sugars - fasting sugars at home have been in 80s past few weeks - he is hoping that he can decrease his t2dm medication, ideally his insulin, with these improvements. Will check A1c in office today and reassess meds. Currently on degludec 15 units twice daily. Last a1c at 6.6  Past Medical History:  Diagnosis Date  . Acute blood loss anemia   . Acute on chronic combined systolic and diastolic congestive heart failure ([place])   . Acute respiratory failure ([place])   . Acute ST elevation myocardial infarction (STEMI) involving left anterior descending (LAD) coronary artery ([place]) 04/16/2019  . AKI (acute kidney injury) ([place])   . Anoxic brain injury ([place])   . Cardiac arrest ([place]) 04/16/2019  . Cardiogenic shock ([place]) 04/16/2019  . Controlled type 2 diabetes mellitus with hyperglycemia ([place])   . Coronary artery disease involving native coronary artery of native heart without angina pectoris   . Jaundice    needed hospitalization in [place]  . Leukocytosis   . Myocardial infarction ([place])   . Thrombocytosis ([place])   . VT (ventricular tachycardia) ([place])     Past Surgical History:  Procedure Laterality Date  . ICD IMPLANT N/A 08/16/2019   Procedure: ICD IMPLANT;  Surgeon: Constance Haw, MD;  Location: [facility];  Service: Cardiovascular;  Laterality: N/A;  . RIGHT HEART CATH AND CORONARY ANGIOGRAPHY N/A 04/16/2019   Procedure: RIGHT HEART CATH AND  CORONARY ANGIOGRAPHY;  Surgeon: Troy Sine, MD;  Location: [facility];  Service: Cardiovascular;  Laterality: N/A;  . VENTRICULAR ASSIST DEVICE INSERTION N/A 04/16/2019   Procedure: VENTRICULAR ASSIST DEVICE INSERTION;  Surgeon: Troy Sine, MD;  Location: [facility];  Service: Cardiovascular;  Laterality: N/A;    Family History  Problem Relation Age of Onset  . Healthy Brother   . Healthy Sister     Social History   Socioeconomic History  . Marital status: Married    Spouse name: Not on file  . Number of children: Not on file  . Years of education: Not on file  . Highest education level: Not on file  Occupational History  . Not on file  Tobacco Use  . Smoking status: Former Research scientist (life sciences)  . Smokeless tobacco: Never Used  Substance and Sexual Activity  . Alcohol use: Yes    Comment: rarely  . Drug use: Never  . Sexual activity: Not Currently  Other Topics Concern  . Not on file  Social History Narrative  . Not on file   Social Determinants of Health   Financial Resource Strain: Low Risk   . Difficulty of Paying Living Expenses: Not hard at all  Food Insecurity: No Food Insecurity  . Worried About Charity fundraiser in the Last Year: Never true  . Ran Out of Food in the Last Year: Never true  Transportation Needs: No Transportation Needs  . Lack of Transportation (Medical): No  . Lack of Transportation (Non-Medical): No  Physical Activity: Insufficiently Active  . Days of Exercise per Week: 3 days  . Minutes of Exercise per Session: 30 min  Stress: No Stress Concern Present  . Feeling of Stress : Not at all  Social Connections: Unknown  . Frequency of Communication with Friends and Family: Three times a week  . Frequency of Social Gatherings with Friends and Family: Twice a week  . Attends Religious Services: Never  . Active Member of Clubs or Organizations: No  . Attends Archivist Meetings: Never  . Marital Status: Patient refused    Intimate Partner Violence: Not At Risk  . Fear of Current or Ex-Partner: No  . Emotionally Abused: No  . Physically Abused: No  . Sexually Abused: No    Outpatient Medications Prior to Visit  Medication Sig Dispense Refill  . acetaminophen (TYLENOL) 325 MG tablet Take 1-2 tablets (325-650 mg total) by mouth every 4 (four) hours as needed for mild pain.    Marland Kitchen aspirin 81 MG chewable tablet Chew 1 tablet (81 mg total) by mouth daily.    Marland Kitchen atorvastatin (LIPITOR) 80 MG tablet Take 1 tablet (80 mg total) by mouth daily at 6 PM. 30 tablet 3  . B Complex-C (B-COMPLEX WITH VITAMIN C) tablet Take 1 tablet by mouth daily.    . blood glucose meter kit and supplies KIT Dispense based on patient and insurance preference. Use up to four times daily as directed. (FOR ICD-9 250.00, 250.01). 1 each 0  . carvedilol (COREG) 6.25 MG tablet Take 1 tablet (6.25 mg total) by mouth 2 (two) times daily with a meal. 180 tablet 3  . empagliflozin ([name]) 10 MG TABS tablet Take 10 mg by mouth daily before breakfast. 30 tablet 3  . famotidine (PEPCID) 20 MG tablet Take 1 tablet (20 mg total) by mouth daily. 30 tablet 0  . furosemide (LASIX) 40 MG tablet Take 1 tablet (40 mg total) by mouth as needed for fluid. Take every day except for Saturday and Sunday (Patient taking differently: Take 40 mg by mouth every Monday, Tuesday, Wednesday, Thursday, and Friday. Take every day except for Saturday and Sunday) 75 tablet 6  . Insulin Degludec (TRESIBA) 100 UNIT/ML SOLN Inject 15 Units into the skin 2 (two) times daily. 50 mL 1  . Insulin Syringe-Needle U-100 (INSULIN SYRINGE 1CC/31GX5/16") 31G X 5/16" 1 ML MISC 1 APPLICATION BY DOES NOT APPLY ROUTE 2 TIMES DAILY 100 each 0  . Insulin Syringes, Disposable, U-100 0.5 ML MISC 1 application by Does not apply route 2 (two) times daily. 100 each 0  . Lancet Device MISC 1 Units by Does not apply route daily before breakfast. 90 each 0  . ONETOUCH ULTRA test strip USE TO CHECK BLOOD  GLUCOSE UP TO FOUR TIMES DAILY AS DIRECTED 100 strip 1  . sacubitril-valsartan (ENTRESTO) 24-26 MG Take 1 tablet by mouth 2 (two) times daily. 60 tablet 6  . spironolactone (ALDACTONE) 25 MG tablet Take 1 tablet (25 mg total) by mouth daily. (Patient taking differently: Take 25 mg by mouth daily in the afternoon. ) 90 tablet 3  . tamsulosin (FLOMAX) 0.4 MG CAPS capsule TAKE 1 CAPSULE(0.4 MG) BY MOUTH DAILY AFTER SUPPER (Patient taking differently: Take 0.4 mg by mouth daily after supper. ) 90 capsule 1   No facility-administered medications prior to visit.    No Known Allergies  ROS  Review of Systems  Constitutional: Negative.   HENT: Negative.   Eyes: Negative.   Respiratory: Negative.   Cardiovascular: Negative.   Gastrointestinal: Negative.   Endocrine: Negative.   Genitourinary: Negative.   Musculoskeletal: Negative.   Skin: Negative.   Allergic/Immunologic: Negative.   Neurological: Negative.   Hematological: Negative.   Psychiatric/Behavioral: Negative.   All other systems reviewed and are negative.     Objective:    Physical Exam  Constitutional: He is oriented to person, place, and time. He appears well-developed and well-nourished. No distress.  Cardiovascular: Normal rate and regular rhythm.  Pulmonary/Chest: Effort normal. No respiratory distress.  Neurological: He is alert and oriented to person, place, and time.  Skin: Skin is warm and dry. He is not diaphoretic. No erythema.  icd insertion site healed appropriately.  Psychiatric: He has a normal mood and affect. His behavior is normal. Judgment and thought content normal.  Nursing note and vitals reviewed.   BP 111/67   Pulse 60   Temp 98.6 F (37 C) (Temporal)   Ht '5\' 7"'  (1.702 m)   Wt 187 lb (84.8 kg)   SpO2 94%   BMI 29.29 kg/m  Wt Readings from Last 3 Encounters:  09/07/19 187 lb (84.8 kg)  08/16/19 185 lb (83.9 kg)  08/09/19 185 lb (83.9 kg)     Health Maintenance Due  Topic Date Due  .  Hepatitis C Screening  09-Jun-1951  . FOOT EXAM  06/27/1960  . OPHTHALMOLOGY EXAM  06/27/1960  . URINE MICROALBUMIN  06/27/1960  . TETANUS/TDAP  06/27/1969  . COLONOSCOPY  06/27/2000  . PNA vac Low Risk Adult (1 of 2 - PCV13) 06/28/2015    There are no preventive care reminders to display for this patient.  No results found for: TSH Lab Results  Component Value Date   WBC 10.7 08/09/2019   HGB 11.2 (L) 08/09/2019   HCT 32.9 (L) 08/09/2019   MCV 92 08/09/2019   PLT 404 08/09/2019   Lab Results  Component Value Date   NA 142 07/30/2019   K 4.4 07/30/2019   CO2 26 07/30/2019   GLUCOSE 97 07/30/2019   BUN 21 07/30/2019   CREATININE 1.20 07/30/2019   BILITOT 0.3 06/08/2019   ALKPHOS 103 06/08/2019   AST 23 06/08/2019   ALT 33 06/08/2019   PROT 6.9 06/08/2019   ALBUMIN 3.7 (L) 06/08/2019   CALCIUM 9.5 07/30/2019   ANIONGAP 9 07/30/2019   Lab Results  Component Value Date   CHOL 119 06/08/2019   Lab Results  Component Value Date   HDL 45 06/08/2019   Lab Results  Component Value Date   LDLCALC 54 06/08/2019   Lab Results  Component Value Date   TRIG 106 06/08/2019   Lab Results  Component Value Date   CHOLHDL 2.6 06/08/2019   Lab Results  Component Value Date   HGBA1C 6.6 (H) 06/08/2019      Assessment & Plan:   Problem List Items Addressed This Visit      Endocrine   Controlled type 2 diabetes mellitus with hyperglycemia, without long-term current use of insulin (HCC) - Primary   Relevant Orders   POCT glycosylated hemoglobin (Hb A1C)     Other   History of ST elevation myocardial infarction (STEMI)   Relevant Orders   Comprehensive metabolic panel   CBC   Lipid Panel    Other Visit Diagnoses    Lipid screening       Relevant Orders  Lipid Panel      No orders of the defined types were placed in this encounter.   Follow-up: Return in about 3 weeks (around 09/28/2019) for telemed follow up.   PLAN  A1c dropped to 5.9. Will reduce  insulin to once daily in mornings. Will follow up in 2-3 weeks to check sugars - hoping to see continued numbers in the range he is at now. Hoping to stop insulin eventually and perhaps replace with oral agent that can provide additional cardioprotective factors.  He has followed up with cardiology well. Next follow up in march  Will plan to have him in office in 3 mo  Labs drawn, will follow up as warranted  Patient encouraged to call clinic with any questions, comments, or concerns.  [name], NP

## 2019-09-07 NOTE — Patient Instructions (Signed)
° ° ° °  If you have lab work done today you will be contacted with your lab results within the next 2 weeks.  If you have not heard from us then please contact us. The fastest way to get your results is to register for My Chart. ° ° °IF you received an x-ray today, you will receive an invoice from [facility]. Please contact [facility] at [phone] with questions or concerns regarding your invoice.  ° °IF you received labwork today, you will receive an invoice from [facility]. Please contact [facility] at [phone] with questions or concerns regarding your invoice.  ° °Our billing staff will not be able to assist you with questions regarding bills from these companies. ° °You will be contacted with the lab results as soon as they are available. The fastest way to get your results is to activate your My Chart account. Instructions are located on the last page of this paperwork. If you have not heard from us regarding the results in 2 weeks, please contact this office. °  ° ° ° °

## 2019-09-08 LAB — COMPREHENSIVE METABOLIC PANEL
ALT: 24 IU/L (ref 0–44)
AST: 20 IU/L (ref 0–40)
Albumin/Globulin Ratio: 1.5 (ref 1.2–2.2)
Albumin: 4.3 g/dL (ref 3.8–4.8)
Alkaline Phosphatase: 101 IU/L (ref 39–117)
BUN/Creatinine Ratio: 19 (ref 10–24)
BUN: 26 mg/dL (ref 8–27)
Bilirubin Total: 0.4 mg/dL (ref 0.0–1.2)
CO2: 21 mmol/L (ref 20–29)
Calcium: 9.4 mg/dL (ref 8.6–10.2)
Chloride: 106 mmol/L (ref 96–106)
Creatinine, Ser: 1.36 mg/dL — ABNORMAL HIGH (ref 0.76–1.27)
GFR calc Af Amer: 61 mL/min/{1.73_m2} (ref >59)
GFR calc non Af Amer: 53 mL/min/{1.73_m2} — ABNORMAL LOW (ref >59)
Globulin, Total: 2.8 g/dL (ref 1.5–4.5)
Glucose: 110 mg/dL — ABNORMAL HIGH (ref 65–99)
Potassium: 4.8 mmol/L (ref 3.5–5.2)
Sodium: 143 mmol/L (ref 134–144)
Total Protein: 7.1 g/dL (ref 6.0–8.5)

## 2019-09-08 LAB — LIPID PANEL
Chol/HDL Ratio: 2.8 ratio (ref 0.0–5.0)
Cholesterol, Total: 119 mg/dL (ref 100–199)
HDL: 43 mg/dL (ref >39)
LDL Chol Calc (NIH): 50 mg/dL (ref 0–99)
Triglycerides: 150 mg/dL — ABNORMAL HIGH (ref 0–149)
VLDL Cholesterol Cal: 26 mg/dL (ref 5–40)

## 2019-09-08 LAB — CBC
Hematocrit: 37.5 % (ref 37.5–51.0)
Hemoglobin: 12.4 g/dL — ABNORMAL LOW (ref 13.0–17.7)
MCH: 31.9 pg (ref 26.6–33.0)
MCHC: 33.1 g/dL (ref 31.5–35.7)
MCV: 96 fL (ref 79–97)
Platelets: 394 10*3/uL (ref 150–450)
RBC: 3.89 x10E6/uL — ABNORMAL LOW (ref 4.14–5.80)
RDW: 13 % (ref 11.6–15.4)
WBC: 9.8 10*3/uL (ref 3.4–10.8)

## 2019-09-10 ENCOUNTER — Encounter: Payer: Self-pay | Admitting: Registered Nurse

## 2019-09-10 NOTE — Progress Notes (Signed)
Labs improving overall. Pt to return in early Feb for check on sugars - hoping to stop insulin at that time  Kathrin Ruddy, NP

## 2019-09-28 ENCOUNTER — Telehealth (INDEPENDENT_AMBULATORY_CARE_PROVIDER_SITE_OTHER): Payer: PPO | Admitting: Registered Nurse

## 2019-09-28 ENCOUNTER — Other Ambulatory Visit: Payer: Self-pay | Admitting: Registered Nurse

## 2019-09-28 ENCOUNTER — Other Ambulatory Visit: Payer: Self-pay

## 2019-09-28 VITALS — Wt 186.0 lb

## 2019-09-28 DIAGNOSIS — E1165 Type 2 diabetes mellitus with hyperglycemia: Secondary | ICD-10-CM

## 2019-09-28 NOTE — Progress Notes (Signed)
Patient need to speak with provider about insulin. Per patient all of the numbers have been under a 100 and feels like he has been okay. No other concerns at this time.

## 2019-09-28 NOTE — Progress Notes (Signed)
Telemedicine Encounter- SOAP NOTE Established Patient  This telephone encounter was conducted with the patient's (or proxy's) verbal consent via audio telecommunications: yes  Patient was instructed to have this encounter in a suitably private space; and to only have persons present to whom they give permission to participate. In addition, patient identity was confirmed by use of name plus two identifiers (DOB and address).  I discussed the limitations, risks, security and privacy concerns of performing an evaluation and management service by telephone and the availability of in person appointments. I also discussed with the patient that there may be a patient responsible charge related to this service. The patient expressed understanding and agreed to proceed.  I spent a total of 12 minutes talking with the patient or their proxy.  No chief complaint on file.   Subjective   Lawrence Olson is a 69 y.o. established patient. Telephone visit today for blood sugar follow up  HPI Recently decreased insulin to once daily dosing in am Also has been working on diet and exercise levels Feels much better overall. Reports fasting sugars at home between 86-113 over the past three weeks. He notes that he has had no signs of hypo- or hyperglycemia.   Patient Active Problem List   Diagnosis Date Noted  . History of ST elevation myocardial infarction (STEMI) 09/07/2019  . Controlled type 2 diabetes mellitus with hyperglycemia, without long-term current use of insulin ([place]) 06/28/2019  . Hyponatremia   . Coronary artery disease involving native coronary artery of native heart without angina pectoris   . Right-sided chest wall pain   . Controlled type 2 diabetes mellitus with hyperglycemia ([place])   . Acute blood loss anemia   . Acute on chronic combined systolic and diastolic congestive heart failure ([place])   . Thrombocytosis ([place])   . Leukocytosis   . Anoxic brain injury ([place]) 05/05/2019  .  Pressure injury of skin 04/30/2019  . AKI (acute kidney injury) ([place])   . Acute respiratory failure ([place])   . Acute ST elevation myocardial infarction (STEMI) involving left anterior descending (LAD) coronary artery ([place]) 04/16/2019  . Cardiac arrest ([place]) 04/16/2019  . Acute respiratory failure with hypoxia and hypercarbia ([place]) 04/16/2019  . Cardiogenic shock ([place]) 04/16/2019  . VT (ventricular tachycardia) (HCC)     Past Medical History:  Diagnosis Date  . Acute blood loss anemia   . Acute on chronic combined systolic and diastolic congestive heart failure ([place])   . Acute respiratory failure ([place])   . Acute ST elevation myocardial infarction (STEMI) involving left anterior descending (LAD) coronary artery ([place]) 04/16/2019  . AKI (acute kidney injury) ([place])   . Anoxic brain injury ([place])   . Cardiac arrest ([place]) 04/16/2019  . Cardiogenic shock ([place]) 04/16/2019  . Controlled type 2 diabetes mellitus with hyperglycemia ([place])   . Coronary artery disease involving native coronary artery of native heart without angina pectoris   . Jaundice    needed hospitalization in [place]  . Leukocytosis   . Myocardial infarction ([place])   . Thrombocytosis ([place])   . VT (ventricular tachycardia) (HCC)     Current Outpatient Medications  Medication Sig Dispense Refill  . acetaminophen (TYLENOL) 325 MG tablet Take 1-2 tablets (325-650 mg total) by mouth every 4 (four) hours as needed for mild pain.    Marland Kitchen aspirin 81 MG chewable tablet Chew 1 tablet (81 mg total) by mouth daily.    Marland Kitchen atorvastatin (LIPITOR) 80 MG tablet Take 1 tablet (80 mg total)  by mouth daily at 6 PM. 30 tablet 3  . B Complex-C (B-COMPLEX WITH VITAMIN C) tablet Take 1 tablet by mouth daily.    . blood glucose meter kit and supplies KIT Dispense based on patient and insurance preference. Use up to four times daily as directed. (FOR ICD-9 250.00, 250.01). 1 each 0  . carvedilol (COREG) 6.25 MG tablet Take 1 tablet (6.25 mg total) by mouth 2  (two) times daily with a meal. 180 tablet 3  . empagliflozin ([name]) 10 MG TABS tablet Take 10 mg by mouth daily before breakfast. 30 tablet 3  . famotidine (PEPCID) 20 MG tablet Take 1 tablet (20 mg total) by mouth daily. 30 tablet 0  . furosemide (LASIX) 40 MG tablet Take 1 tablet (40 mg total) by mouth as needed for fluid. Take every day except for Saturday and Sunday (Patient taking differently: Take 40 mg by mouth every Monday, Tuesday, Wednesday, Thursday, and Friday. Take every day except for Saturday and Sunday) 75 tablet 6  . Insulin Degludec (TRESIBA) 100 UNIT/ML SOLN Inject 15 Units into the skin 2 (two) times daily. 50 mL 1  . Insulin Syringe-Needle U-100 (INSULIN SYRINGE 1CC/31GX5/16") 31G X 5/16" 1 ML MISC 1 APPLICATION BY DOES NOT APPLY ROUTE 2 TIMES DAILY 100 each 0  . Insulin Syringes, Disposable, U-100 0.5 ML MISC 1 application by Does not apply route 2 (two) times daily. 100 each 0  . Lancet Device MISC 1 Units by Does not apply route daily before breakfast. 90 each 0  . ONETOUCH ULTRA test strip USE TO CHECK BLOOD GLUCOSE UP TO FOUR TIMES DAILY AS DIRECTED 100 strip 1  . sacubitril-valsartan (ENTRESTO) 24-26 MG Take 1 tablet by mouth 2 (two) times daily. 60 tablet 6  . spironolactone (ALDACTONE) 25 MG tablet Take 1 tablet (25 mg total) by mouth daily. (Patient taking differently: Take 25 mg by mouth daily in the afternoon. ) 90 tablet 3  . tamsulosin (FLOMAX) 0.4 MG CAPS capsule TAKE 1 CAPSULE(0.4 MG) BY MOUTH DAILY AFTER SUPPER (Patient taking differently: Take 0.4 mg by mouth daily after supper. ) 90 capsule 1   No current facility-administered medications for this visit.    No Known Allergies  Social History   Socioeconomic History  . Marital status: Married    Spouse name: Not on file  . Number of children: Not on file  . Years of education: Not on file  . Highest education level: Not on file  Occupational History  . Not on file  Tobacco Use  . Smoking status:  Former Research scientist (life sciences)  . Smokeless tobacco: Never Used  Substance and Sexual Activity  . Alcohol use: Yes    Comment: rarely  . Drug use: Never  . Sexual activity: Not Currently  Other Topics Concern  . Not on file  Social History Narrative  . Not on file   Social Determinants of Health   Financial Resource Strain: Low Risk   . Difficulty of Paying Living Expenses: Not hard at all  Food Insecurity: No Food Insecurity  . Worried About Charity fundraiser in the Last Year: Never true  . Ran Out of Food in the Last Year: Never true  Transportation Needs: No Transportation Needs  . Lack of Transportation (Medical): No  . Lack of Transportation (Non-Medical): No  Physical Activity: Insufficiently Active  . Days of Exercise per Week: 3 days  . Minutes of Exercise per Session: 30 min  Stress: No Stress Concern Present  .  Feeling of Stress : Not at all  Social Connections: Unknown  . Frequency of Communication with Friends and Family: Three times a week  . Frequency of Social Gatherings with Friends and Family: Twice a week  . Attends Religious Services: Never  . Active Member of Clubs or Organizations: No  . Attends Archivist Meetings: Never  . Marital Status: Patient refused  Intimate Partner Violence: Not At Risk  . Fear of Current or Ex-Partner: No  . Emotionally Abused: No  . Physically Abused: No  . Sexually Abused: No    Review of Systems  Constitutional: Negative.   HENT: Negative.   Eyes: Negative.   Respiratory: Negative.   Cardiovascular: Negative.   Gastrointestinal: Negative.   Genitourinary: Negative.   Musculoskeletal: Negative.   Skin: Negative.   Neurological: Negative.   Endo/Heme/Allergies: Negative.   Psychiatric/Behavioral: Negative.   All other systems reviewed and are negative.   Objective   Vitals as reported by the patient: Today's Vitals   09/28/19 0807  Weight: 186 lb (84.4 kg)    Diagnoses and all orders for this  visit:  Controlled type 2 diabetes mellitus with hyperglycemia, without long-term current use of insulin ([place])   PLAN  Pt appears to be doing very well with lifestyle control of T2DM.   He may try stopping his insulin with continued blood sugar monitoring at this time - if he noticed hyperglycemia, he should resume insulin at the prescribed dose.  Return in 3 mos for med check  Patient encouraged to call clinic with any questions, comments, or concerns.   I discussed the assessment and treatment plan with the patient. The patient was provided an opportunity to ask questions and all were answered. The patient agreed with the plan and demonstrated an understanding of the instructions.   The patient was advised to call back or seek an in-person evaluation if the symptoms worsen or if the condition fails to improve as anticipated.  I provided 12 minutes of non-face-to-face time during this encounter.  [name], NP  Primary Care at [facility]

## 2019-10-09 ENCOUNTER — Other Ambulatory Visit (HOSPITAL_COMMUNITY): Payer: Self-pay | Admitting: *Deleted

## 2019-10-09 MED ORDER — ATORVASTATIN CALCIUM 80 MG PO TABS: 80.0000 mg | ORAL_TABLET | Freq: Every day | ORAL | 11 refills | Status: DC

## 2019-10-30 ENCOUNTER — Telehealth (HOSPITAL_COMMUNITY): Payer: Self-pay

## 2019-10-30 NOTE — Telephone Encounter (Signed)
COVID-19 pre-appointment screening questions: ANSWERED BY PTS DAUGHTER   Do you have a history of COVID-19 or a positive test result in the past 7-10 days? NO  To the best of your knowledge, have you been in close contact with anyone with a confirmed diagnosis of COVID 19?NO  Have you had any one or more of the following: Fever, chills, cough, shortness of breath (out of the normal for you) or any flu-like symptoms?NO  Are you experiencing any of the following symptoms that is new or out of usual for you:NO  . Ear, nose or throat discomfort . Sore throat . Headache . Muscle Pain . Diarrhea . Loss of taste or smell   . Reviewed all the following with patient: REVIEWED . Use of hand sanitizer when entering the building . Everyone is required to wear a mask in the building, if you do not have a mask we are happy to provide you with one when you arrive . NO Visitor guidelines   If patient answers YES to any of questions they must change to a virtual visit and place note in comments about symptoms

## 2019-10-31 ENCOUNTER — Encounter (HOSPITAL_COMMUNITY): Payer: Self-pay | Admitting: Internal Medicine

## 2019-10-31 ENCOUNTER — Ambulatory Visit (HOSPITAL_COMMUNITY)
Admission: RE | Admit: 2019-10-31 | Discharge: 2019-10-31 | Disposition: A | Discharge status: Home / Self Care | Payer: PPO | Source: Ambulatory Visit | Attending: Internal Medicine | Admitting: Internal Medicine

## 2019-10-31 ENCOUNTER — Other Ambulatory Visit: Payer: Self-pay

## 2019-10-31 VITALS — BP 110/56 | HR 57 | Wt 189.0 lb

## 2019-10-31 DIAGNOSIS — E1122 Type 2 diabetes mellitus with diabetic chronic kidney disease: Secondary | ICD-10-CM | POA: Diagnosis not present

## 2019-10-31 DIAGNOSIS — Z794 Long term (current) use of insulin: Secondary | ICD-10-CM | POA: Insufficient documentation

## 2019-10-31 DIAGNOSIS — Z79899 Other long term (current) drug therapy: Secondary | ICD-10-CM | POA: Insufficient documentation

## 2019-10-31 DIAGNOSIS — I5022 Chronic systolic (congestive) heart failure: Secondary | ICD-10-CM | POA: Diagnosis not present

## 2019-10-31 DIAGNOSIS — I5042 Chronic combined systolic (congestive) and diastolic (congestive) heart failure: Secondary | ICD-10-CM | POA: Diagnosis not present

## 2019-10-31 DIAGNOSIS — N1831 Chronic kidney disease, stage 3a: Secondary | ICD-10-CM | POA: Diagnosis not present

## 2019-10-31 DIAGNOSIS — Z95810 Presence of automatic (implantable) cardiac defibrillator: Secondary | ICD-10-CM | POA: Insufficient documentation

## 2019-10-31 DIAGNOSIS — I252 Old myocardial infarction: Secondary | ICD-10-CM | POA: Insufficient documentation

## 2019-10-31 DIAGNOSIS — I2510 Atherosclerotic heart disease of native coronary artery without angina pectoris: Secondary | ICD-10-CM | POA: Diagnosis not present

## 2019-10-31 DIAGNOSIS — Z7982 Long term (current) use of aspirin: Secondary | ICD-10-CM | POA: Insufficient documentation

## 2019-10-31 DIAGNOSIS — N184 Chronic kidney disease, stage 4 (severe): Secondary | ICD-10-CM | POA: Diagnosis not present

## 2019-10-31 DIAGNOSIS — Z87891 Personal history of nicotine dependence: Secondary | ICD-10-CM | POA: Insufficient documentation

## 2019-10-31 DIAGNOSIS — Z8674 Personal history of sudden cardiac arrest: Secondary | ICD-10-CM | POA: Diagnosis not present

## 2019-10-31 LAB — BASIC METABOLIC PANEL
Anion gap: 9 (ref 5–15)
BUN: 28 mg/dL — ABNORMAL HIGH (ref 8–23)
CO2: 23 mmol/L (ref 22–32)
Calcium: 9.2 mg/dL (ref 8.9–10.3)
Chloride: 107 mmol/L (ref 98–111)
Creatinine, Ser: 1.26 mg/dL — ABNORMAL HIGH (ref 0.61–1.24)
GFR calc Af Amer: >60 mL/min (ref >60)
GFR calc non Af Amer: 58 mL/min — ABNORMAL LOW (ref >60)
Glucose, Bld: 119 mg/dL — ABNORMAL HIGH (ref 70–99)
Potassium: 4.1 mmol/L (ref 3.5–5.1)
Sodium: 139 mmol/L (ref 135–145)

## 2019-10-31 LAB — BRAIN NATRIURETIC PEPTIDE: B Natriuretic Peptide: 105.2 pg/mL — ABNORMAL HIGH (ref 0.0–100.0)

## 2019-10-31 MED ORDER — ENTRESTO 49-51 MG PO TABS: 1.0000 | ORAL_TABLET | Freq: Two times a day (BID) | ORAL | 6 refills | Status: DC

## 2019-10-31 NOTE — Progress Notes (Signed)
ADVANCED HF CLINIC NOTE    HF Cardiologist: Arlean Thies  HPI:  Lawrence Olson is a 69 year old male from [place] with h/o CAD, systolic HF and VF cardiac arrest.   He had no known medical problems until 04/16/19 when he experienced witnessed VF arrest at work. Had bystander CPR x 20 mins with defib and intubation in field. He was taken to cath lab for emergent catheterization. Attempts at opening LAD unsuccessful (felt to be chronic). Impella placed and he was taken to CCU. EF 25-30%. Had protracted course with respiratory failure, renal failure (requiring CVVHD) and anoxic brain injury. There was also a question of acute amio pneumonitis with ESR> 100.   Extubated 05/01/19. Mental status and renal function improved. Discharged to CIR. Diw well in CR with dramatic improvement. Lifevest placed. Creatinine plateaued at 2.4 so ACE/ARB/ARNI not started.   Had ICD placed 08/16/19   Here with his wife. Feeling great. Walking 90 minutes every night. No CP, SOB, edema, or dizziness. Compliant with meds   Echo 12/20 EF 30-35% Personally reviewed   ROS: All systems negative except as listed in HPI, PMH and Problem List.  SH:  Social History   Socioeconomic History  . Marital status: Married    Spouse name: Not on file  . Number of children: Not on file  . Years of education: Not on file  . Highest education level: Not on file  Occupational History  . Not on file  Tobacco Use  . Smoking status: Former Research scientist (life sciences)  . Smokeless tobacco: Never Used  Substance and Sexual Activity  . Alcohol use: Yes    Comment: rarely  . Drug use: Never  . Sexual activity: Not Currently  Other Topics Concern  . Not on file  Social History Narrative  . Not on file   Social Determinants of Health   Financial Resource Strain: Low Risk   . Difficulty of Paying Living Expenses: Not hard at all  Food Insecurity: No Food Insecurity  . Worried About Charity fundraiser in the Last Year: Never true  . Ran Out of  Food in the Last Year: Never true  Transportation Needs: No Transportation Needs  . Lack of Transportation (Medical): No  . Lack of Transportation (Non-Medical): No  Physical Activity: Insufficiently Active  . Days of Exercise per Week: 3 days  . Minutes of Exercise per Session: 30 min  Stress: No Stress Concern Present  . Feeling of Stress : Not at all  Social Connections: Unknown  . Frequency of Communication with Friends and Family: Three times a week  . Frequency of Social Gatherings with Friends and Family: Twice a week  . Attends Religious Services: Never  . Active Member of Clubs or Organizations: No  . Attends Archivist Meetings: Never  . Marital Status: Patient refused  Intimate Partner Violence: Not At Risk  . Fear of Current or Ex-Partner: No  . Emotionally Abused: No  . Physically Abused: No  . Sexually Abused: No    FH:  Family History  Problem Relation Age of Onset  . Healthy Brother   . Healthy Sister     Past Medical History:  Diagnosis Date  . Acute blood loss anemia   . Acute on chronic combined systolic and diastolic congestive heart failure ([place])   . Acute respiratory failure ([place])   . Acute ST elevation myocardial infarction (STEMI) involving left anterior descending (LAD) coronary artery ([place]) 04/16/2019  . AKI (acute kidney injury) ([place])   .  Anoxic brain injury ([place])   . Cardiac arrest ([place]) 04/16/2019  . Cardiogenic shock ([place]) 04/16/2019  . Controlled type 2 diabetes mellitus with hyperglycemia ([place])   . Coronary artery disease involving native coronary artery of native heart without angina pectoris   . Jaundice    needed hospitalization in [place]  . Leukocytosis   . Myocardial infarction ([place])   . Thrombocytosis ([place])   . VT (ventricular tachycardia) ([facility])     Current Outpatient Medications  Medication Sig Dispense Refill  . aspirin 81 MG chewable tablet Chew 1 tablet (81 mg total) by mouth daily.    . B Complex-C (B-COMPLEX WITH  VITAMIN C) tablet Take 1 tablet by mouth daily.    . blood glucose meter kit and supplies KIT Dispense based on patient and insurance preference. Use up to four times daily as directed. (FOR ICD-9 250.00, 250.01). 1 each 0  . carvedilol (COREG) 6.25 MG tablet Take 1 tablet (6.25 mg total) by mouth 2 (two) times daily with a meal. 180 tablet 3  . empagliflozin ([name]) 10 MG TABS tablet Take 10 mg by mouth daily before breakfast. 30 tablet 3  . Insulin Syringe-Needle U-100 (INSULIN SYRINGE 1CC/31GX5/16") 31G X 5/16" 1 ML MISC 1 APPLICATION BY DOES NOT APPLY ROUTE 2 TIMES DAILY 100 each 0  . Insulin Syringes, Disposable, U-100 0.5 ML MISC 1 application by Does not apply route 2 (two) times daily. 100 each 0  . Lancet Device MISC 1 Units by Does not apply route daily before breakfast. 90 each 0  . Lancets (ONETOUCH DELICA PLUS [id number]) MISC USE AS DIRECTED DAILY BEFORE BREAKFAST 100 each 3  . ONETOUCH ULTRA test strip USE TO CHECK BLOOD GLUCOSE UP TO FOUR TIMES DAILY AS DIRECTED 100 strip 1  . sacubitril-valsartan (ENTRESTO) 24-26 MG Take 1 tablet by mouth 2 (two) times daily. 60 tablet 6  . spironolactone (ALDACTONE) 25 MG tablet Take 1 tablet (25 mg total) by mouth daily. (Patient taking differently: Take 25 mg by mouth daily in the afternoon. ) 90 tablet 3  . tamsulosin (FLOMAX) 0.4 MG CAPS capsule TAKE 1 CAPSULE(0.4 MG) BY MOUTH DAILY AFTER SUPPER (Patient taking differently: Take 0.4 mg by mouth daily after supper. ) 90 capsule 1   No current facility-administered medications for this encounter.    Vitals:   10/31/19 0944  BP: (!) 110/56  Pulse: (!) 57  SpO2: 97%  Weight: 85.7 kg (189 lb)    PHYSICAL EXAM:  General:  Well appearing. No resp difficulty HEENT: normal Neck: supple. no JVD. Carotids 2+ bilat; no bruits. No lymphadenopathy or thryomegaly appreciated. Cor: PMI nondisplaced. Regular rate & rhythm. No rubs, gallops or murmurs. Lungs: clear Abdomen: soft, nontender,  nondistended. No hepatosplenomegaly. No bruits or masses. Good bowel sounds. Extremities: no cyanosis, clubbing, rash, edema Neuro: alert & orientedx3, cranial nerves grossly intact. moves all 4 extremities w/o difficulty. Affect pleasant   Ecg: Sinus brady 54 septal Qs. Personally reviewed   ASSESSMENT & PLAN:  1. Chronic systolic HF - Echo 8/36 EF 25-30% in setting of cardiac arrest and severe iCM - Echo 12/20 EF 30-35% - Doing great. NYHA I. Volume status looks good.  - Continue  carvedilol to 6.25 bid - Continue spiro 12.5 daily  - Increase Entresto to 49/51 bid as tolerated  - Continue [name] 10  - s/p MDT ICD  2. CAD s/p recent anterior MI  - treated medically as unable to open LAD CTO - No s/s ischemia Will  review films with Dr. Martin[place] to decide if any need to consider PCI (unlikely) - Continue ASA/statin/imdur - LDL 50  3. H/o VF arrest - s/p MDT ICD  4. DM2  - Continue [name]  5. CKD 3a - last creatinine 1.3 - repeat today    Glori Bickers, MD  9:51 AM

## 2019-10-31 NOTE — Patient Instructions (Signed)
Increase Entresto to 49/51 mg Twice daily   Labs done today, your results will be available in MyChart, we will contact you for abnormal readings.  Please call our office in July to schedule your follow up appointment  If you have any questions or concerns before your next appointment please send [place] a message through [place] or call our office at [phone].  At the [facility], you and your health needs are our priority. As part of our continuing mission to provide you with exceptional heart care, we have created designated Provider Care Teams. These Care Teams include your primary Cardiologist (physician) and Advanced Practice Providers (APPs- Physician Assistants and Nurse Practitioners) who all work together to provide you with the care you need, when you need it.   You may see any of the following providers on your designated Care Team at your next follow up: Marland Kitchen Dr Glori Bickers . Dr Loralie Champagne . Darrick Grinder, NP . Lyda Jester, PA . Audry Riles, PharmD   Please be sure to bring in all your medications bottles to every appointment.

## 2019-11-19 ENCOUNTER — Ambulatory Visit (INDEPENDENT_AMBULATORY_CARE_PROVIDER_SITE_OTHER): Payer: PPO | Admitting: *Deleted

## 2019-11-19 DIAGNOSIS — Z95810 Presence of automatic (implantable) cardiac defibrillator: Secondary | ICD-10-CM | POA: Diagnosis not present

## 2019-11-19 LAB — CUP PACEART REMOTE DEVICE CHECK
Battery Remaining Longevity: 134 mo
Battery Voltage: 3.09 V
Brady Statistic RV Percent Paced: 0.04 %
Date Time Interrogation Session: 20210329044225
HighPow Impedance: 60 Ohm
Implantable Lead Implant Date: 20201224
Implantable Lead Location: 753860
Implantable Pulse Generator Implant Date: 20201224
Lead Channel Impedance Value: 247 Ohm
Lead Channel Impedance Value: 361 Ohm
Lead Channel Pacing Threshold Amplitude: 1.25 V
Lead Channel Pacing Threshold Pulse Width: 0.4 ms
Lead Channel Sensing Intrinsic Amplitude: 6.5 mV
Lead Channel Sensing Intrinsic Amplitude: 6.5 mV
Lead Channel Setting Pacing Amplitude: 2.75 V
Lead Channel Setting Pacing Pulse Width: 0.4 ms
Lead Channel Setting Sensing Sensitivity: 0.3 mV

## 2019-11-19 NOTE — Progress Notes (Signed)
ICD Remote  

## 2019-11-20 ENCOUNTER — Other Ambulatory Visit: Payer: Self-pay | Admitting: Registered Nurse

## 2019-11-20 ENCOUNTER — Encounter: Payer: Self-pay | Admitting: Cardiology

## 2019-11-20 ENCOUNTER — Other Ambulatory Visit: Payer: Self-pay

## 2019-11-20 ENCOUNTER — Ambulatory Visit: Payer: PPO | Admitting: Cardiology

## 2019-11-20 VITALS — BP 124/58 | HR 47 | Ht 67.0 in | Wt 188.0 lb

## 2019-11-20 DIAGNOSIS — I5042 Chronic combined systolic (congestive) and diastolic (congestive) heart failure: Secondary | ICD-10-CM

## 2019-11-20 DIAGNOSIS — I255 Ischemic cardiomyopathy: Secondary | ICD-10-CM

## 2019-11-20 NOTE — Progress Notes (Signed)
Electrophysiology Office Note   Date:  11/20/2019   ID:  Lawrence Olson, DOB [birth year], MRN [id number]  PCP:  [name], NP  Cardiologist:  Bens[place]hon Primary Electrophysiologist:  Jefrey Raburn Meredith Leeds, MD    Chief Complaint: CHF   History of Present Illness: Lawrence Olson is a 69 y.o. male who is being seen today for the evaluation of CHF at the request of [name], NP. Presenting today for electrophysiology evaluation.  He has a history of coronary artery disease, chronic systolic heart failure, and VF arrest.  On 04/16/2019, he experienced a witnessed VF arrest while at work.  He had bystander CPR for 20 minutes.  He had an emergent heart cath, but his LAD was occluded and not able to be reopened.  Impella was placed and he was taken to the CCU.  Ejection fraction at that time was 25 to 30%.  He had a protracted course with respiratory failure, renal failure, and anoxic brain injury.  He Is currently wearing a LifeVest.  He is walking up to 30 minutes a day with mild shortness of breath.  He is now status post Medtronic ICD implanted 08/16/2019.  Today, denies symptoms of palpitations, chest pain, shortness of breath, orthopnea, PND, lower extremity edema, claudication, dizziness, presyncope, syncope, bleeding, or neurologic sequela. The patient is tolerating medications without difficulties.  Since his device was implanted he has done well.  He has had no chest pain or shortness of breath.  He continues to walk on a nightly basis without issues.  He has had no other ventricular arrhythmias.   Past Medical History:  Diagnosis Date  . Acute blood loss anemia   . Acute on chronic combined systolic and diastolic congestive heart failure ([place])   . Acute respiratory failure ([place])   . Acute ST elevation myocardial infarction (STEMI) involving left anterior descending (LAD) coronary artery ([place]) 04/16/2019  . AKI (acute kidney injury) ([place])   . Anoxic brain injury ([place])   .  Cardiac arrest ([place]) 04/16/2019  . Cardiogenic shock ([place]) 04/16/2019  . Controlled type 2 diabetes mellitus with hyperglycemia ([place])   . Coronary artery disease involving native coronary artery of native heart without angina pectoris   . Jaundice    needed hospitalization in [place]  . Leukocytosis   . Myocardial infarction ([place])   . Thrombocytosis ([place])   . VT (ventricular tachycardia) ([place])    Past Surgical History:  Procedure Laterality Date  . ICD IMPLANT N/A 08/16/2019   Procedure: ICD IMPLANT;  Surgeon: Constance Haw, MD;  Location: [facility];  Service: Cardiovascular;  Laterality: N/A;  . RIGHT HEART CATH AND CORONARY ANGIOGRAPHY N/A 04/16/2019   Procedure: RIGHT HEART CATH AND CORONARY ANGIOGRAPHY;  Surgeon: Troy Sine, MD;  Location: [facility];  Service: Cardiovascular;  Laterality: N/A;  . VENTRICULAR ASSIST DEVICE INSERTION N/A 04/16/2019   Procedure: VENTRICULAR ASSIST DEVICE INSERTION;  Surgeon: Troy Sine, MD;  Location: [facility];  Service: Cardiovascular;  Laterality: N/A;     Current Outpatient Medications  Medication Sig Dispense Refill  . aspirin 81 MG chewable tablet Chew 1 tablet (81 mg total) by mouth daily.    . B Complex-C (B-COMPLEX WITH VITAMIN C) tablet Take 1 tablet by mouth daily.    . blood glucose meter kit and supplies KIT Dispense based on patient and insurance preference. Use up to four times daily as directed. (FOR ICD-9 250.00, 250.01). 1 each 0  . carvedilol (COREG)  6.25 MG tablet Take 1 tablet (6.25 mg total) by mouth 2 (two) times daily with a meal. 180 tablet 3  . empagliflozin ([name]) 10 MG TABS tablet Take 10 mg by mouth daily before breakfast. 30 tablet 3  . Lancet Device MISC 1 Units by Does not apply route daily before breakfast. 90 each 0  . Lancets (ONETOUCH DELICA PLUS [id number]) MISC USE AS DIRECTED DAILY BEFORE BREAKFAST 100 each 3  . ONETOUCH ULTRA test strip USE TO CHECK BLOOD GLUCOSE UP TO  FOUR TIMES DAILY AS DIRECTED 100 strip 1  . sacubitril-valsartan (ENTRESTO) 49-51 MG Take 1 tablet by mouth 2 (two) times daily. 60 tablet 6  . spironolactone (ALDACTONE) 25 MG tablet Take 25 mg by mouth daily.    . tamsulosin (FLOMAX) 0.4 MG CAPS capsule Take 0.4 mg by mouth daily after supper.    Marland Kitchen atorvastatin (LIPITOR) 80 MG tablet Take 80 mg by mouth daily.     No current facility-administered medications for this visit.    Allergies:   Patient has no known allergies.   Social History:  The patient  reports that he has quit smoking. He has never used smokeless tobacco. He reports current alcohol use. He reports that he does not use drugs.   Family History:  The patient's family history includes Healthy in his brother and sister.    ROS:  Please see the history of present illness.   Otherwise, review of systems is positive for none.   All other systems are reviewed and negative.   PHYSICAL EXAM: VS:  BP (!) 124/58   Pulse (!) 47   Ht _0  (1.702 m)   Wt 188 lb (85.3 kg)   SpO2 96%   BMI 29.44 kg/m  , BMI Body mass index is 29.44 kg/m. GEN: Well nourished, well developed, in no acute distress  HEENT: normal  Neck: no JVD, carotid bruits, or masses Cardiac: RRR; no murmurs, rubs, or gallops,no edema  Respiratory:  clear to auscultation bilaterally, normal work of breathing GI: soft, nontender, nondistended, + BS MS: no deformity or atrophy  Skin: warm and dry, device site well healed Neuro:  Strength and sensation are intact Psych: euthymic mood, full affect  EKG:  EKG is ordered today. Personal review of the ekg ordered shows sinus rhythm  Personal review of the device interrogation today. Results in [place]: 05/11/2019: Magnesium 1.9 09/07/2019: ALT 24; Hemoglobin 12.4; Platelets 394 10/31/2019: B Natriuretic Peptide 105.2; BUN 28; Creatinine, Ser 1.26; Potassium 4.1; Sodium 139    Lipid Panel     Component Value Date/Time   CHOL 119 09/07/2019 0943    TRIG 150 (H) 09/07/2019 0943   HDL 43 09/07/2019 0943   CHOLHDL 2.8 09/07/2019 0943   CHOLHDL 3.7 04/16/2019 1335   VLDL 20 04/16/2019 1335   LDLCALC 50 09/07/2019 0943     Wt Readings from Last 3 Encounters:  11/20/19 188 lb (85.3 kg)  10/31/19 189 lb (85.7 kg)  09/28/19 186 lb (84.4 kg)      Other studies Reviewed: Additional studies/ records that were reviewed today include: TTE 07/30/19  Review of the above records today demonstrates:   1. Left ventricular ejection fraction, by visual estimation, is 30 to 35%. The left ventricle has moderate to severely decreased function. There is moderately increased left ventricular hypertrophy.  2. Elevated left atrial and left ventricular end-diastolic pressures.  3. Left ventricular diastolic parameters are consistent with Grade I diastolic dysfunction (impaired relaxation).  4. The left ventricle demonstrates global hypokinesis.  5. There is a false tendon in the left ventricle.  6. Global right ventricle has normal systolic function.The right ventricular size is normal. No increase in right ventricular wall thickness.  7. Left atrial size was mildly dilated.  8. Right atrial size was normal.  9. The mitral valve is normal in structure. No evidence of mitral valve regurgitation. No evidence of mitral stenosis. 10. The tricuspid valve is normal in structure. Tricuspid valve regurgitation is mild. 11. The aortic valve is normal in structure. Aortic valve regurgitation is mild. No evidence of aortic valve sclerosis or stenosis. 12. The pulmonic valve was normal in structure. Pulmonic valve regurgitation is not visualized. 13. The inferior vena cava is normal in size with greater than 50% respiratory variability, suggesting right atrial pressure of 3 mmHg. 14. Since the last study on 04/24/2019 LVEF has improved from 25-30% to 30-35%. Cardiac MRI is recommended for further evaluation.   ASSESSMENT AND PLAN:  1.  Chronic systolic heart  failure Olson to ischemic cardiomyopathy: Currently on carvedilol, Aldactone, Entresto, [name].  He is status post ICD.  No obvious signs of volume overload.  No changes at this time.    2.  VF arrest: Status post Medtronic single-chamber ICD implanted 08/16/2019.  Device functioning appropriately.  No changes.  3.  Coronary artery disease status post anterior MI: Unable to open LAD CTO.  No acute ischemia noted.  Plan per primary cardiology.     Current medicines are reviewed at length with the patient today.   The patient does not have concerns regarding his medicines.  The following changes were made today: None  Labs/ tests ordered today include:  Orders Placed This Encounter  Procedures  . EKG 12-Lead     Disposition:   FU with Sabriyah Wilcher 9 months  Signed, Ramie Erman Meredith Leeds, MD  11/20/2019 2:43 PM     [facility] [address] [place] [address] [phone] (office) [phone] (fax)

## 2019-11-20 NOTE — Patient Instructions (Addendum)
Medication Instructions:  Your physician recommends that you continue on your current medications as directed. Please refer to the Current Medication list given to you today.  *If you need a refill on your cardiac medications before your next appointment, please call your pharmacy*   Lab Work: None ordered If you have labs (blood work) drawn today and your tests are completely normal, you will receive your results only by: Marland Kitchen MyChart Message (if you have MyChart) OR . A paper copy in the mail If you have any lab test that is abnormal or we need to change your treatment, we will call you to review the results.   Testing/Procedures: None ordered   Follow-Up: Remote monitoring is used to monitor your Pacemaker of ICD from home. This monitoring reduces the number of office visits required to check your device to one time per year. It allows [place] to keep an eye on the functioning of your device to ensure it is working properly. You are scheduled for a device check from home on 02/18/2020. You may send your transmission at any time that day. If you have a wireless device, the transmission will be sent automatically. After your physician reviews your transmission, you will receive a postcard with your next transmission date.   At [facility], you and your health needs are our priority.  As part of our continuing mission to provide you with exceptional heart care, we have created designated Provider Care Teams.  These Care Teams include your primary Cardiologist (physician) and Advanced Practice Providers (APPs -  Physician Assistants and Nurse Practitioners) who all work together to provide you with the care you need, when you need it.  We recommend signing up for the patient portal called "MyChart".  Sign up information is provided on this After Visit Summary.  MyChart is used to connect with patients for Virtual Visits (Telemedicine).  Patients are able to view lab/test results, encounter notes,  upcoming appointments, etc.  Non-urgent messages can be sent to your provider as well.   To learn more about what you can do with MyChart, go to [email or web address].    Your next appointment:   9 month(s)  The format for your next appointment:   In Person  Provider:   Allegra Lai, MD   Thank you for choosing [facility]!!   Trinidad Curet, RN [phone]    Other Instructions

## 2019-11-21 ENCOUNTER — Other Ambulatory Visit (HOSPITAL_COMMUNITY): Payer: Self-pay

## 2019-11-21 MED ORDER — JARDIANCE 10 MG PO TABS: 10.0000 mg | ORAL_TABLET | Freq: Every day | ORAL | 3 refills | Status: DC

## 2019-11-23 LAB — CUP PACEART INCLINIC DEVICE CHECK
Brady Statistic RV Percent Paced: 0 %
Date Time Interrogation Session: 20210330145352
HighPow Impedance: 63 Ohm
Implantable Lead Implant Date: 20201224
Implantable Lead Location: 753860
Implantable Pulse Generator Implant Date: 20201224
Lead Channel Impedance Value: 399 Ohm
Lead Channel Pacing Threshold Amplitude: 1 V
Lead Channel Pacing Threshold Pulse Width: 0.4 ms
Lead Channel Sensing Intrinsic Amplitude: 7.4 mV
Lead Channel Setting Pacing Amplitude: 2.75 V
Lead Channel Setting Pacing Pulse Width: 0.4 ms
Lead Channel Setting Sensing Sensitivity: 0.3 mV

## 2019-12-07 ENCOUNTER — Other Ambulatory Visit: Payer: Self-pay

## 2019-12-07 ENCOUNTER — Encounter: Payer: Self-pay | Admitting: Registered Nurse

## 2019-12-07 ENCOUNTER — Ambulatory Visit (INDEPENDENT_AMBULATORY_CARE_PROVIDER_SITE_OTHER): Payer: PPO | Admitting: Registered Nurse

## 2019-12-07 VITALS — BP 102/61 | HR 60 | Temp 97.7°F | Resp 18 | Ht 67.0 in | Wt 186.2 lb

## 2019-12-07 DIAGNOSIS — E119 Type 2 diabetes mellitus without complications: Secondary | ICD-10-CM | POA: Diagnosis not present

## 2019-12-07 DIAGNOSIS — Z1211 Encounter for screening for malignant neoplasm of colon: Secondary | ICD-10-CM

## 2019-12-07 DIAGNOSIS — Z1159 Encounter for screening for other viral diseases: Secondary | ICD-10-CM | POA: Diagnosis not present

## 2019-12-07 DIAGNOSIS — Z0100 Encounter for examination of eyes and vision without abnormal findings: Secondary | ICD-10-CM | POA: Diagnosis not present

## 2019-12-07 DIAGNOSIS — E1165 Type 2 diabetes mellitus with hyperglycemia: Secondary | ICD-10-CM

## 2019-12-07 LAB — POCT GLYCOSYLATED HEMOGLOBIN (HGB A1C): Hemoglobin A1C: 6.2 % — AB (ref 4.0–5.6)

## 2019-12-07 NOTE — Patient Instructions (Signed)
° ° ° °  If you have lab work done today you will be contacted with your lab results within the next 2 weeks.  If you have not heard from us then please contact us. The fastest way to get your results is to register for My Chart. ° ° °IF you received an x-ray today, you will receive an invoice from [facility]. Please contact [facility] at [phone] with questions or concerns regarding your invoice.  ° °IF you received labwork today, you will receive an invoice from [facility]. Please contact [facility] at [phone] with questions or concerns regarding your invoice.  ° °Our billing staff will not be able to assist you with questions regarding bills from these companies. ° °You will be contacted with the lab results as soon as they are available. The fastest way to get your results is to activate your My Chart account. Instructions are located on the last page of this paperwork. If you have not heard from us regarding the results in 2 weeks, please contact this office. °  ° ° ° °

## 2019-12-07 NOTE — Progress Notes (Signed)
Established Patient Office Visit  Subjective:  Patient ID: Lawrence Olson, male    DOB: [birth year]  Age: 69 y.o. [name] [id number]  CC:  Chief Complaint  Patient presents with  . Follow-up    3 month follow up for medical conditions no question or concerns at this time    HPI Garland Lagrand presents for t2dm follow up  Reports sugars of 90-120 consistently. No sxs of hyper or hypoglycemia. POCT A1c shows 6.2 today.  Feels very well overall  Recently followed up with Cards for STEMI - doing well, stable, they suggested 9 mo follow up.  Otherwise no concerns.  BP mildly low today, asymptomatic.   Past Medical History:  Diagnosis Date  . Acute blood loss anemia   . Acute on chronic combined systolic and diastolic congestive heart failure ([place])   . Acute respiratory failure ([place])   . Acute ST elevation myocardial infarction (STEMI) involving left anterior descending (LAD) coronary artery ([place]) 04/16/2019  . AKI (acute kidney injury) ([place])   . Anoxic brain injury ([place])   . Cardiac arrest ([place]) 04/16/2019  . Cardiogenic shock ([place]) 04/16/2019  . Controlled type 2 diabetes mellitus with hyperglycemia ([place])   . Coronary artery disease involving native coronary artery of native heart without angina pectoris   . Jaundice    needed hospitalization in [place]  . Leukocytosis   . Myocardial infarction ([place])   . Thrombocytosis ([place])   . VT (ventricular tachycardia) ([place])     Past Surgical History:  Procedure Laterality Date  . ICD IMPLANT N/A 08/16/2019   Procedure: ICD IMPLANT;  Surgeon: Constance Haw, MD;  Location: [facility];  Service: Cardiovascular;  Laterality: N/A;  . RIGHT HEART CATH AND CORONARY ANGIOGRAPHY N/A 04/16/2019   Procedure: RIGHT HEART CATH AND CORONARY ANGIOGRAPHY;  Surgeon: Troy Sine, MD;  Location: [facility];  Service: Cardiovascular;  Laterality: N/A;  . VENTRICULAR ASSIST DEVICE INSERTION N/A 04/16/2019   Procedure: VENTRICULAR  ASSIST DEVICE INSERTION;  Surgeon: Troy Sine, MD;  Location: [facility];  Service: Cardiovascular;  Laterality: N/A;    Family History  Problem Relation Age of Onset  . Healthy Brother   . Healthy Sister     Social History   Socioeconomic History  . Marital status: Married    Spouse name: Not on file  . Number of children: Not on file  . Years of education: Not on file  . Highest education level: Not on file  Occupational History  . Not on file  Tobacco Use  . Smoking status: Former Research scientist (life sciences)  . Smokeless tobacco: Never Used  Substance and Sexual Activity  . Alcohol use: Yes    Comment: rarely  . Drug use: Never  . Sexual activity: Not Currently  Other Topics Concern  . Not on file  Social History Narrative  . Not on file   Social Determinants of Health   Financial Resource Strain: Low Risk   . Difficulty of Paying Living Expenses: Not hard at all  Food Insecurity: No Food Insecurity  . Worried About Charity fundraiser in the Last Year: Never true  . Ran Out of Food in the Last Year: Never true  Transportation Needs: No Transportation Needs  . Lack of Transportation (Medical): No  . Lack of Transportation (Non-Medical): No  Physical Activity: Insufficiently Active  . Days of Exercise per Week: 3 days  . Minutes of Exercise per Session: 30 min  Stress: No  Stress Concern Present  . Feeling of Stress : Not at all  Social Connections: Unknown  . Frequency of Communication with Friends and Family: Three times a week  . Frequency of Social Gatherings with Friends and Family: Twice a week  . Attends Religious Services: Never  . Active Member of Clubs or Organizations: No  . Attends Archivist Meetings: Never  . Marital Status: Patient refused  Intimate Partner Violence: Not At Risk  . Fear of Current or Ex-Partner: No  . Emotionally Abused: No  . Physically Abused: No  . Sexually Abused: No    Outpatient Medications Prior to Visit   Medication Sig Dispense Refill  . aspirin 81 MG chewable tablet Chew 1 tablet (81 mg total) by mouth daily.    Marland Kitchen atorvastatin (LIPITOR) 80 MG tablet Take 80 mg by mouth daily.    . B Complex-C (B-COMPLEX WITH VITAMIN C) tablet Take 1 tablet by mouth daily.    . blood glucose meter kit and supplies KIT Dispense based on patient and insurance preference. Use up to four times daily as directed. (FOR ICD-9 250.00, 250.01). 1 each 0  . carvedilol (COREG) 6.25 MG tablet Take 1 tablet (6.25 mg total) by mouth 2 (two) times daily with a meal. 180 tablet 3  . empagliflozin ([name]) 10 MG TABS tablet Take 10 mg by mouth daily before breakfast. 90 tablet 3  . Lancet Device MISC 1 Units by Does not apply route daily before breakfast. 90 each 0  . Lancets (ONETOUCH DELICA PLUS [id number]) MISC USE AS DIRECTED DAILY BEFORE BREAKFAST 100 each 3  . ONETOUCH ULTRA test strip USE TO CHECK BLOOD GLUCOSE UP TO FOUR TIMES DAILY AS DIRECTED 100 strip 1  . sacubitril-valsartan (ENTRESTO) 49-51 MG Take 1 tablet by mouth 2 (two) times daily. 60 tablet 6  . spironolactone (ALDACTONE) 25 MG tablet Take 25 mg by mouth daily.    . tamsulosin (FLOMAX) 0.4 MG CAPS capsule Take 0.4 mg by mouth daily after supper.     No facility-administered medications prior to visit.    No Known Allergies  ROS Review of Systems  Constitutional: Negative.   HENT: Negative.   Eyes: Negative.   Respiratory: Negative.   Cardiovascular: Negative.   Gastrointestinal: Negative.   Endocrine: Negative.   Genitourinary: Negative.   Musculoskeletal: Negative.   Skin: Negative.   Allergic/Immunologic: Negative.   Neurological: Negative.   Hematological: Negative.   Psychiatric/Behavioral: Negative.   All other systems reviewed and are negative.     Objective:    Physical Exam  Constitutional: He is oriented to person, place, and time. He appears well-developed and well-nourished. No distress.  Cardiovascular: Normal rate and  regular rhythm.  Pulmonary/Chest: Effort normal. No respiratory distress.  Neurological: He is alert and oriented to person, place, and time.  Skin: Skin is warm and dry. No rash noted. He is not diaphoretic. No erythema. No pallor.  Psychiatric: He has a normal mood and affect. His behavior is normal. Judgment and thought content normal.  Nursing note and vitals reviewed.   BP 102/61   Pulse 60   Temp 97.7 F (36.5 C) (Temporal)   Resp 18   Ht _0  (1.702 m)   Wt 186 lb 3.2 oz (84.5 kg)   SpO2 96%   BMI 29.16 kg/m  Wt Readings from Last 3 Encounters:  12/07/19 186 lb 3.2 oz (84.5 kg)  11/20/19 188 lb (85.3 kg)  10/31/19 189 lb (85.7 kg)  Health Maintenance Due  Topic Date Due  . Hepatitis C Screening  Never done  . URINE MICROALBUMIN  Never done  . PNA vac Low Risk Adult (1 of 2 - PCV13) Never done    There are no preventive care reminders to display for this patient.  No results found for: TSH Lab Results  Component Value Date   WBC 9.8 09/07/2019   HGB 12.4 (L) 09/07/2019   HCT 37.5 09/07/2019   MCV 96 09/07/2019   PLT 394 09/07/2019   Lab Results  Component Value Date   NA 139 10/31/2019   K 4.1 10/31/2019   CO2 23 10/31/2019   GLUCOSE 119 (H) 10/31/2019   BUN 28 (H) 10/31/2019   CREATININE 1.26 (H) 10/31/2019   BILITOT 0.4 09/07/2019   ALKPHOS 101 09/07/2019   AST 20 09/07/2019   ALT 24 09/07/2019   PROT 7.1 09/07/2019   ALBUMIN 4.3 09/07/2019   CALCIUM 9.2 10/31/2019   ANIONGAP 9 10/31/2019   Lab Results  Component Value Date   CHOL 119 09/07/2019   Lab Results  Component Value Date   HDL 43 09/07/2019   Lab Results  Component Value Date   LDLCALC 50 09/07/2019   Lab Results  Component Value Date   TRIG 150 (H) 09/07/2019   Lab Results  Component Value Date   CHOLHDL 2.8 09/07/2019   Lab Results  Component Value Date   HGBA1C 6.2 (A) 12/07/2019      Assessment & Plan:   Problem List Items Addressed This Visit       Endocrine   Controlled type 2 diabetes mellitus with hyperglycemia, without long-term current use of insulin ([place]) - Primary   Relevant Orders   Ambulatory referral to Ophthalmology   POCT glycosylated hemoglobin (Hb A1C) (Completed)    Other Visit Diagnoses    Diabetic eye exam ([place])       Relevant Orders   Ambulatory referral to Ophthalmology   Special screening for malignant neoplasms, colon       Relevant Orders   Ambulatory referral to Gastroenterology   Screening for viral disease       Relevant Orders   Hepatitis C antibody      No orders of the defined types were placed in this encounter.   Follow-up: Return in about 6 months (around 06/07/2020) for t2dm follow up.   PLAN  Showing great diabetic control. Continue current regimen, as jardiance may provide cardioprotective effect  Return in 6 mos for t2dm check  Labs collected today, will follow up as warranted  Patient encouraged to call clinic with any questions, comments, or concerns.  [name], NP

## 2019-12-08 LAB — HEPATITIS C ANTIBODY: Hep C Virus Ab: <0.1 s/co ratio (ref 0.0–0.9)

## 2020-01-04 ENCOUNTER — Other Ambulatory Visit: Payer: Self-pay | Admitting: Registered Nurse

## 2020-01-04 NOTE — Telephone Encounter (Signed)
Patient is requesting a refill of the following medications: Requested Prescriptions   Pending Prescriptions Disp Refills   tamsulosin (FLOMAX) 0.4 MG CAPS capsule [Pharmacy Med Name: TAMSULOSIN 0.4MG  CAPSULES] 90 capsule     Sig: TAKE 1 CAPSULE(0.4 MG) BY MOUTH DAILY AFTER SUPPER    Date of patient request: 01/04/20 Last office visit: 12/07/19 Date of last refill: 07/05/19 Last refill amount: 90 Follow up time period per chart: 06/06/20  When I went to sent this rx in. This med is no longer formulated with patient ins plan and alternate option needed.

## 2020-02-18 ENCOUNTER — Ambulatory Visit (INDEPENDENT_AMBULATORY_CARE_PROVIDER_SITE_OTHER): Payer: PPO | Admitting: *Deleted

## 2020-02-18 DIAGNOSIS — I255 Ischemic cardiomyopathy: Secondary | ICD-10-CM

## 2020-02-19 LAB — CUP PACEART REMOTE DEVICE CHECK
Battery Remaining Longevity: 132 mo
Battery Voltage: 3.05 V
Brady Statistic RV Percent Paced: 0.04 %
Date Time Interrogation Session: 20210628043823
HighPow Impedance: 65 Ohm
Implantable Lead Implant Date: 20201224
Implantable Lead Location: 753860
Implantable Pulse Generator Implant Date: 20201224
Lead Channel Impedance Value: 285 Ohm
Lead Channel Impedance Value: 342 Ohm
Lead Channel Pacing Threshold Amplitude: 0.875 V
Lead Channel Pacing Threshold Pulse Width: 0.4 ms
Lead Channel Sensing Intrinsic Amplitude: 6.5 mV
Lead Channel Sensing Intrinsic Amplitude: 6.5 mV
Lead Channel Setting Pacing Amplitude: 2 V
Lead Channel Setting Pacing Pulse Width: 0.4 ms
Lead Channel Setting Sensing Sensitivity: 0.3 mV

## 2020-02-20 NOTE — Progress Notes (Signed)
Remote ICD transmission.   

## 2020-02-21 ENCOUNTER — Other Ambulatory Visit: Payer: Self-pay | Admitting: Registered Nurse

## 2020-04-09 ENCOUNTER — Ambulatory Visit (HOSPITAL_COMMUNITY)
Admission: RE | Admit: 2020-04-09 | Discharge: 2020-04-09 | Disposition: A | Discharge status: Home / Self Care | Payer: PPO | Source: Ambulatory Visit | Attending: Internal Medicine | Admitting: Internal Medicine

## 2020-04-09 ENCOUNTER — Telehealth (HOSPITAL_COMMUNITY): Payer: Self-pay | Admitting: *Deleted

## 2020-04-09 ENCOUNTER — Encounter (HOSPITAL_COMMUNITY): Payer: Self-pay | Admitting: Internal Medicine

## 2020-04-09 ENCOUNTER — Other Ambulatory Visit: Payer: Self-pay

## 2020-04-09 ENCOUNTER — Telehealth (HOSPITAL_COMMUNITY): Payer: Self-pay | Admitting: Pharmacy Technician

## 2020-04-09 VITALS — BP 110/68 | HR 70 | Wt 184.0 lb

## 2020-04-09 DIAGNOSIS — I2510 Atherosclerotic heart disease of native coronary artery without angina pectoris: Secondary | ICD-10-CM | POA: Diagnosis not present

## 2020-04-09 DIAGNOSIS — E1122 Type 2 diabetes mellitus with diabetic chronic kidney disease: Secondary | ICD-10-CM | POA: Diagnosis not present

## 2020-04-09 DIAGNOSIS — I252 Old myocardial infarction: Secondary | ICD-10-CM | POA: Diagnosis not present

## 2020-04-09 DIAGNOSIS — I5022 Chronic systolic (congestive) heart failure: Secondary | ICD-10-CM

## 2020-04-09 DIAGNOSIS — Z79899 Other long term (current) drug therapy: Secondary | ICD-10-CM | POA: Diagnosis not present

## 2020-04-09 DIAGNOSIS — Z7982 Long term (current) use of aspirin: Secondary | ICD-10-CM | POA: Diagnosis not present

## 2020-04-09 DIAGNOSIS — Z95810 Presence of automatic (implantable) cardiac defibrillator: Secondary | ICD-10-CM | POA: Insufficient documentation

## 2020-04-09 DIAGNOSIS — N1831 Chronic kidney disease, stage 3a: Secondary | ICD-10-CM | POA: Insufficient documentation

## 2020-04-09 DIAGNOSIS — Z87891 Personal history of nicotine dependence: Secondary | ICD-10-CM | POA: Insufficient documentation

## 2020-04-09 DIAGNOSIS — Z7984 Long term (current) use of oral hypoglycemic drugs: Secondary | ICD-10-CM | POA: Diagnosis not present

## 2020-04-09 DIAGNOSIS — Z8674 Personal history of sudden cardiac arrest: Secondary | ICD-10-CM | POA: Diagnosis not present

## 2020-04-09 LAB — BRAIN NATRIURETIC PEPTIDE: B Natriuretic Peptide: 85.6 pg/mL (ref 0.0–100.0)

## 2020-04-09 LAB — BASIC METABOLIC PANEL
Anion gap: 7 (ref 5–15)
BUN: 29 mg/dL — ABNORMAL HIGH (ref 8–23)
CO2: 23 mmol/L (ref 22–32)
Calcium: 9.6 mg/dL (ref 8.9–10.3)
Chloride: 106 mmol/L (ref 98–111)
Creatinine, Ser: 1.26 mg/dL — ABNORMAL HIGH (ref 0.61–1.24)
GFR calc Af Amer: >60 mL/min (ref >60)
GFR calc non Af Amer: 58 mL/min — ABNORMAL LOW (ref >60)
Glucose, Bld: 107 mg/dL — ABNORMAL HIGH (ref 70–99)
Potassium: 5.8 mmol/L — ABNORMAL HIGH (ref 3.5–5.1)
Sodium: 136 mmol/L (ref 135–145)

## 2020-04-09 MED ORDER — ENTRESTO 97-103 MG PO TABS: 1.0000 | ORAL_TABLET | Freq: Two times a day (BID) | ORAL | 6 refills | Status: DC

## 2020-04-09 NOTE — Telephone Encounter (Signed)
Patient was seen in clinic today. His co-pay on Entresto and Vania Rea are about $130 a piece. Started an Land for [employer] and [employer] patient assistance.   Provided the patient with a three week sample of [name] 10mg .  LOT: [id number] EXP: 12/254  Both applications have been sent in. Will follow up.

## 2020-04-09 NOTE — Progress Notes (Signed)
ADVANCED HF CLINIC NOTE    HF Cardiologist: Obert Espindola  HPI:  Lawrence Olson is a 69 year old male from [place] with h/o CAD, systolic HF and VF cardiac arrest.   He had no known medical problems until 04/16/19 when he experienced witnessed VF arrest at work. Had bystander CPR x 20 mins with defib and intubation in field. He was taken to cath lab for emergent catheterization. Attempts at opening LAD unsuccessful (felt to be chronic). Impella placed and he was taken to CCU. EF 25-30%. Had protracted course with respiratory failure, renal failure (requiring CVVHD) and anoxic brain injury. There was also a question of acute amio pneumonitis with ESR> 100.   Extubated 05/01/19. Mental status and renal function improved. Discharged to CIR. Diw well in CR with dramatic improvement. Lifevest placed. Creatinine plateaued at 2.4 so ACE/ARB/ARNI not started.   Had ICD placed 08/16/19   Here with his wife. Feels great. Walking 45 mins every day. No CP, SOB or edema. No problems with meds except for cost.   Echo 12/20 EF 30-35% Personally reviewed   ROS: All systems negative except as listed in HPI, PMH and Problem List.  SH:  Social History   Socioeconomic History   Marital status: Married    Spouse name: Not on file   Number of children: Not on file   Years of education: Not on file   Highest education level: Not on file  Occupational History   Not on file  Tobacco Use   Smoking status: Former Smoker   Smokeless tobacco: Never Used  Building services engineer Use: Never used  Substance and Sexual Activity   Alcohol use: Yes    Comment: rarely   Drug use: Never   Sexual activity: Not Currently  Other Topics Concern   Not on file  Social History Narrative   Not on file   Social Determinants of Health   Financial Resource Strain: Low Risk    Difficulty of Paying Living Expenses: Not hard at all  Food Insecurity: No Food Insecurity   Worried About Programme researcher, broadcasting/film/video in  the Last Year: Never true   Ran Out of Food in the Last Year: Never true  Transportation Needs: No Transportation Needs   Lack of Transportation (Medical): No   Lack of Transportation (Non-Medical): No  Physical Activity: Insufficiently Active   Days of Exercise per Week: 3 days   Minutes of Exercise per Session: 30 min  Stress: No Stress Concern Present   Feeling of Stress : Not at all  Social Connections: Unknown   Frequency of Communication with Friends and Family: Three times a week   Frequency of Social Gatherings with Friends and Family: Twice a week   Attends Religious Services: Never   Database administrator or Organizations: No   Attends Engineer, structural: Never   Marital Status: Patient refused  Catering manager Violence: Not At Risk   Fear of Current or Ex-Partner: No   Emotionally Abused: No   Physically Abused: No   Sexually Abused: No    FH:  Family History  Problem Relation Age of Onset   Healthy Brother    Healthy Sister     Past Medical History:  Diagnosis Date   Acute blood loss anemia    Acute on chronic combined systolic and diastolic congestive heart failure (HCC)    Acute respiratory failure ([facility])    Acute ST elevation myocardial infarction (STEMI) involving left anterior descending (  LAD) coronary artery ([place]) 04/16/2019   AKI (acute kidney injury) ([place])    Anoxic brain injury ([place])    Cardiac arrest ([place]) 04/16/2019   Cardiogenic shock ([place]) 04/16/2019   Controlled type 2 diabetes mellitus with hyperglycemia ([place])    Coronary artery disease involving native coronary artery of native heart without angina pectoris    Jaundice    needed hospitalization in [place]   Leukocytosis    Myocardial infarction ([place])    Thrombocytosis ([place])    VT (ventricular tachycardia) ([facility])     Current Outpatient Medications  Medication Sig Dispense Refill   aspirin 81 MG chewable tablet Chew 1 tablet (81 mg total) by mouth  daily.     atorvastatin (LIPITOR) 80 MG tablet Take 80 mg by mouth daily.     B Complex-C (B-COMPLEX WITH VITAMIN C) tablet Take 1 tablet by mouth daily.     blood glucose meter kit and supplies KIT Dispense based on patient and insurance preference. Use up to four times daily as directed. (FOR ICD-9 250.00, 250.01). 1 each 0   carvedilol (COREG) 6.25 MG tablet Take 1 tablet (6.25 mg total) by mouth 2 (two) times daily with a meal. 180 tablet 3   empagliflozin ([name]) 10 MG TABS tablet Take 10 mg by mouth daily before breakfast. 90 tablet 3   Lancet Device MISC 1 Units by Does not apply route daily before breakfast. 90 each 0   Lancets (ONETOUCH DELICA PLUS [id number]) MISC USE AS DIRECTED DAILY BEFORE BREAKFAST 100 each 3   ONETOUCH ULTRA test strip USE TO CHECK BLOOD GLUCOSE UP TO FOUR TIMES DAILY AS DIRECTED 100 strip 1   sacubitril-valsartan (ENTRESTO) 49-51 MG Take 1 tablet by mouth 2 (two) times daily. 60 tablet 6   spironolactone (ALDACTONE) 25 MG tablet Take 25 mg by mouth daily.     tamsulosin (FLOMAX) 0.4 MG CAPS capsule TAKE 1 CAPSULE(0.4 MG) BY MOUTH DAILY AFTER SUPPER 90 capsule 2   No current facility-administered medications for this encounter.    Vitals:   04/09/20 1220  BP: 110/68  Pulse: 70  SpO2: 96%  Weight: 83.5 kg (184 lb)    PHYSICAL EXAM:  General:  Well appearing. No resp difficulty HEENT: normal Neck: supple. no JVD. Carotids 2+ bilat; no bruits. No lymphadenopathy or thryomegaly appreciated. Cor: PMI nondisplaced. Regular rate & rhythm. No rubs, gallops or murmurs. Lungs: clear Abdomen: soft, nontender, nondistended. No hepatosplenomegaly. No bruits or masses. Good bowel sounds. Extremities: no cyanosis, clubbing, rash, edema Neuro: alert & orientedx3, cranial nerves grossly intact. moves all 4 extremities w/o difficulty. Affect pleasant  ASSESSMENT & PLAN:  1. Chronic systolic HF - Echo 7/10 EF 25-30% in setting of cardiac arrest and  severe iCM - Echo 12/20 EF 30-35% - Doing great. NYHA I. Volume status looks great.  - Continue  carvedilol to 6.25 bid - Continue spiro 12.5 daily  - Increase Entresto to 97/103 bid - Continue [name] 10  - s/p MDT ICD - unable to afford [name] and Entresto. Will have him meet with HF PharmD today to discuss options  2. CAD s/p recent anterior MI  - treated medically as unable to open LAD CTO - No s/s ischemia. Films reviewed with IC team and not options for PCI - Continue ASA/statin/[name]  3. H/o VF arrest - s/p MDT ICD  4. DM2  - Continue [name]. Meet with PharmD regarding cost   5. CKD 3a - last creatinine 1.3 - repeat today  Glori Bickers, MD  12:42 PM

## 2020-04-09 NOTE — Telephone Encounter (Signed)
K 5.8   Per Dr Haroldine Laws, stop Lawrence Olson repeat bmet early next week. I spoke w/pt's daughter she is aware, agreeable, and verbalized understanding. She will call back tomorrow to sch lab appt as she has to check their schedule

## 2020-04-09 NOTE — Patient Instructions (Addendum)
Continue Entresto 49/51 mg Twice daily UNTIL YOU GET THE NEW SHIPPMENT OF THE 97/103 MG Twice daily   Labs done today, your results will be available in MyChart, we will contact you for abnormal readings.  Please call our office in January to schedule your echocardiogram and follow up appointment  If you have any questions or concerns before your next appointment please send [place] a message through [place] or call our office at [phone].    TO LEAVE A MESSAGE FOR THE NURSE SELECT OPTION 2, PLEASE LEAVE A MESSAGE INCLUDING: . YOUR NAME . DATE OF BIRTH . CALL BACK NUMBER . REASON FOR CALL**this is important as we prioritize the call backs  Lawrence Olson AS LONG AS YOU CALL BEFORE 4:00 PM  At the [facility], you and your health needs are our priority. As part of our continuing mission to provide you with exceptional heart care, we have created designated Provider Care Teams. These Care Teams include your primary Cardiologist (physician) and Advanced Practice Providers (APPs- Physician Assistants and Nurse Practitioners) who all work together to provide you with the care you need, when you need it.   You may see any of the following providers on your designated Care Team at your next follow up: Lawrence Olson Kitchen Lawrence Olson . Lawrence Olson . Lawrence Grinder, Lawrence Olson . Lawrence Jester, Lawrence Olson . Lawrence Olson, Lawrence Olson   Please be sure to bring in all your medications bottles to every appointment.

## 2020-04-10 ENCOUNTER — Telehealth (HOSPITAL_COMMUNITY): Payer: Self-pay | Admitting: Internal Medicine

## 2020-04-10 NOTE — Telephone Encounter (Signed)
Pt daughter scheduled lab appt 08/25 @9 :15am, that is the only day she could bring him.

## 2020-04-14 NOTE — Telephone Encounter (Signed)
Called [employer] to check the status of the patient's application. Representative Ava stated that it was still in process and she could finish it once we got off the call. A determination will be faxed to the office once complete.  Will follow up.

## 2020-04-16 ENCOUNTER — Ambulatory Visit (HOSPITAL_COMMUNITY)
Admission: RE | Admit: 2020-04-16 | Discharge: 2020-04-16 | Disposition: A | Discharge status: Home / Self Care | Payer: PPO | Source: Ambulatory Visit | Attending: Internal Medicine | Admitting: Internal Medicine

## 2020-04-16 ENCOUNTER — Other Ambulatory Visit: Payer: Self-pay

## 2020-04-16 DIAGNOSIS — I5022 Chronic systolic (congestive) heart failure: Secondary | ICD-10-CM | POA: Insufficient documentation

## 2020-04-16 LAB — BASIC METABOLIC PANEL
Anion gap: 7 (ref 5–15)
BUN: 25 mg/dL — ABNORMAL HIGH (ref 8–23)
CO2: 25 mmol/L (ref 22–32)
Calcium: 8.9 mg/dL (ref 8.9–10.3)
Chloride: 104 mmol/L (ref 98–111)
Creatinine, Ser: 1.23 mg/dL (ref 0.61–1.24)
GFR calc Af Amer: >60 mL/min (ref >60)
GFR calc non Af Amer: 60 mL/min — ABNORMAL LOW (ref >60)
Glucose, Bld: 161 mg/dL — ABNORMAL HIGH (ref 70–99)
Potassium: 4.1 mmol/L (ref 3.5–5.1)
Sodium: 136 mmol/L (ref 135–145)

## 2020-04-16 NOTE — Telephone Encounter (Signed)
[employer] sent a denial, stating the patient was over income. Called to get clarification, the representative stated that they go by the federal poverty guidelines and the patient needs to be at 300%, he was at 355%. The foundation would need proof of income, since what is on the application is lower than what they found electronically.  Called and left the patient's daughter a message.  Will follow up.

## 2020-04-30 ENCOUNTER — Telehealth (HOSPITAL_COMMUNITY): Payer: Self-pay | Admitting: Pharmacy Technician

## 2020-04-30 NOTE — Telephone Encounter (Signed)
Sent in POI to [facility].  Will follow up.

## 2020-04-30 NOTE — Telephone Encounter (Signed)
Advanced Heart Failure Patient Advocate Encounter   Spoke with patient's daughter regarding her dad's [place] assistance. She wanted to confirm that they were sent the right strength of Entresto. We also spoke about [employer] needing income documentation for the patient as well.  She will drop that off sometime this week.  Patient was approved to receive Entresto from [employer]  Patient ID: [id number] Effective dates: 04/16/20 through 08/22/20  Will follow up.

## 2020-05-05 ENCOUNTER — Telehealth (HOSPITAL_COMMUNITY): Payer: Self-pay | Admitting: Licensed Clinical Social Worker

## 2020-05-05 NOTE — Telephone Encounter (Signed)
CSW informed that pt currently denied for jardiance assistance through [facility] until he applies for Extra Help program.  CSW consulted by pharmacy patient advocate to assist with application process so we can move forward with the application.  CSW called pt daughter who assists patient with these things to discuss- unable to reach- left message requesting return call  Will continue to follow and assist as needed  Jorge Ny, [facility] Desk#: [phone] Cell#: [phone]

## 2020-05-06 ENCOUNTER — Telehealth (HOSPITAL_COMMUNITY): Payer: Self-pay | Admitting: Licensed Clinical Social Worker

## 2020-05-06 NOTE — Telephone Encounter (Signed)
CSW called pt daughter back this morning to discuss need to apply for Extra Help program in order to move forward with [facility] Cares program for [place] assistance.  Pt dtr expressed understanding but does not think her parents would be agreeable to disclosing the personal information required to apply for Extra Help (SS#, bank account balances, etc).  She will plan to discuss with her parents tomorrow but might be interested in talking to MD about prescribing a cheaper medication.  CSW emailed pt dtr a link to apply for Extra Help program with her parents if they are agreeable and encouraged her to reach out to myself or patient advocate to inform of their decision whether or not to move forward with applying.  Will continue to follow and assist as needed  Jorge Ny, [facility] Desk#: [phone] Cell#: [phone]

## 2020-05-07 NOTE — Telephone Encounter (Signed)
[employer] would like for the patient to apply for and be denied LIS. Lawrence Olson (CSW) reached out to start that process with them and the patient was not comfortable disclosing some of the requested financial information.  Patient's daughter called in today. She was inquiring if we would be able to change her dads medication to a cheaper alternative. I explained to her that the alternative [name]), is not covered by the insurance. Even if we get the medication approved it will have a higher co-pay than the [name] as it is not preferred. She understood that they would need to pay for the medication if he wishes to take it since they are uncomfortable with the LIS application process.  She is going to call me if they decide otherwise.  [name], CPhT

## 2020-05-19 ENCOUNTER — Ambulatory Visit (INDEPENDENT_AMBULATORY_CARE_PROVIDER_SITE_OTHER): Payer: PPO | Admitting: Emergency Medicine

## 2020-05-19 DIAGNOSIS — I255 Ischemic cardiomyopathy: Secondary | ICD-10-CM | POA: Diagnosis not present

## 2020-05-19 LAB — CUP PACEART REMOTE DEVICE CHECK
Battery Remaining Longevity: 130 mo
Battery Voltage: 3.03 V
Brady Statistic RV Percent Paced: 0.03 %
Date Time Interrogation Session: 20210927012505
HighPow Impedance: 66 Ohm
Implantable Lead Implant Date: 20201224
Implantable Lead Location: 753860
Implantable Pulse Generator Implant Date: 20201224
Lead Channel Impedance Value: 285 Ohm
Lead Channel Impedance Value: 304 Ohm
Lead Channel Pacing Threshold Amplitude: 0.875 V
Lead Channel Pacing Threshold Pulse Width: 0.4 ms
Lead Channel Sensing Intrinsic Amplitude: 8 mV
Lead Channel Sensing Intrinsic Amplitude: 8 mV
Lead Channel Setting Pacing Amplitude: 2 V
Lead Channel Setting Pacing Pulse Width: 0.4 ms
Lead Channel Setting Sensing Sensitivity: 0.3 mV

## 2020-05-21 NOTE — Progress Notes (Signed)
Remote ICD transmission.   

## 2020-06-02 ENCOUNTER — Other Ambulatory Visit: Payer: Self-pay | Admitting: Registered Nurse

## 2020-06-02 NOTE — Telephone Encounter (Signed)
Requested Prescriptions  Pending Prescriptions Disp Refills  . ONETOUCH ULTRA test strip [employer] Med Name: [place] TESTST(NEW)100] 300 strip 1    Sig: USE TO CHECK BLOOD GLUCOSE UP TO FOUR TIMES DAILY AS DIRECTED     Endocrinology: Diabetes - Testing Supplies Passed - 06/02/2020  7:54 AM      Passed - Valid encounter within last 12 months    Recent Outpatient Visits          5 months ago Controlled type 2 diabetes mellitus with hyperglycemia, without long-term current use of insulin ([place])   [facility] at [place], NP   8 months ago Controlled type 2 diabetes mellitus with hyperglycemia, without long-term current use of insulin ([place])   [facility] at [place], NP   8 months ago Controlled type 2 diabetes mellitus with hyperglycemia, without long-term current use of insulin [facility])   [facility], Delfino Lovett, NP   12 months ago Encounter to establish care   [facility] at [place], NP   3 years ago Rash and nonspecific skin eruption   Primary Care at [facility], Gelene Mink, PA-C      Future Appointments            In 4 days [name], NP Primary Care at [place], [facility]

## 2020-06-06 ENCOUNTER — Ambulatory Visit (INDEPENDENT_AMBULATORY_CARE_PROVIDER_SITE_OTHER): Payer: PPO | Admitting: Registered Nurse

## 2020-06-06 ENCOUNTER — Other Ambulatory Visit: Payer: Self-pay

## 2020-06-06 ENCOUNTER — Encounter: Payer: Self-pay | Admitting: Registered Nurse

## 2020-06-06 VITALS — BP 114/63 | HR 53 | Temp 97.9°F | Resp 18 | Ht 67.0 in | Wt 188.6 lb

## 2020-06-06 DIAGNOSIS — E1165 Type 2 diabetes mellitus with hyperglycemia: Secondary | ICD-10-CM | POA: Diagnosis not present

## 2020-06-06 DIAGNOSIS — Z23 Encounter for immunization: Secondary | ICD-10-CM

## 2020-06-06 LAB — POCT GLYCOSYLATED HEMOGLOBIN (HGB A1C): Hemoglobin A1C: 6.1 % — AB (ref 4.0–5.6)

## 2020-06-06 NOTE — Patient Instructions (Signed)
° ° ° °  If you have lab work done today you will be contacted with your lab results within the next 2 weeks.  If you have not heard from us then please contact us. The fastest way to get your results is to register for My Chart. ° ° °IF you received an x-ray today, you will receive an invoice from [facility]. Please contact [facility] at [phone] with questions or concerns regarding your invoice.  ° °IF you received labwork today, you will receive an invoice from [facility]. Please contact [facility] at [phone] with questions or concerns regarding your invoice.  ° °Our billing staff will not be able to assist you with questions regarding bills from these companies. ° °You will be contacted with the lab results as soon as they are available. The fastest way to get your results is to activate your My Chart account. Instructions are located on the last page of this paperwork. If you have not heard from us regarding the results in 2 weeks, please contact this office. °  ° ° ° °

## 2020-06-06 NOTE — Addendum Note (Signed)
Addended by: Meredeth Ide on: 06/06/2020 04:46 PM   Modules accepted: Orders

## 2020-06-06 NOTE — Progress Notes (Signed)
Established Patient Office Visit  Subjective:  Patient ID: Lawrence Olson, male    DOB: 08-05-1951  Age: 69 y.o. [name] [id number]  CC:  Chief Complaint  Patient presents with  . Follow-up    6 month follow up for diabetes. Per patient he has no other questions or concerns.    HPI Lawrence Olson presents for t2dm follow up   Compliant with meds. Diet is mostly under control but has some days where he eats larger portions or high carb / high glycemic index meals. Is aware of this and continues to try for improvement. Walks 40-60 minutes nightly either outside or on treadmill.   Does note that he usually only takes 1/2 tablet (71m) jardiance as it is expensive.   Feeling well. With daughter who is interpreting.  Past Medical History:  Diagnosis Date  . Acute blood loss anemia   . Acute on chronic combined systolic and diastolic congestive heart failure ([facility]   . Acute respiratory failure ([facility]   . Acute ST elevation myocardial infarction (STEMI) involving left anterior descending (LAD) coronary artery ([facility] 04/16/2019  . AKI (acute kidney injury) ([facility]   . Anoxic brain injury ([facility]   . Cardiac arrest ([facility] 04/16/2019  . Cardiogenic shock ([facility] 04/16/2019  . Controlled type 2 diabetes mellitus with hyperglycemia ([facility]   . Coronary artery disease involving native coronary artery of native heart without angina pectoris   . [place]undice    [place] . Le[place]ocytosis   . Myocardial infarction ([place]   . Thrombocytosis   . VT (ventricular tachycardia) ([place]     Past Surgical History:  Procedure Laterality Date  . ICD IMPLANT N/A 08/16/2019   Procedure: ICD IMPLANT;  Surgeon: CConstance Haw MD;  [facility];  Service: Cardiovascular;  Laterality: N/A;  . RIGHT HEART CATH AND CORONARY ANGIOGRAPHY N/A 04/16/2019   Procedure: RIGHT HEART CATH AND CORONARY ANGIOGRAPHY;  Surgeon: KTroy Sine MD;  [facility];  Service:  Cardiovascular;  Laterality: N/A;  . VENTRICULAR ASSIST DEVICE INSERTION N/A 04/16/2019   Procedure: VENTRICULAR ASSIST DEVICE INSERTION;  Surgeon: KTroy Sine MD;  [facility];  Service: Cardiovascular;  Laterality: N/A;    Family History  Problem Relation Age of Onset  . Healthy Brother   . Healthy Sister     Social History   Socioeconomic History  . Marital status: Married    Spouse name: Not on file  . Number of children: Not on file  . Years of education: Not on file  . Highest education level: Not on file  Occupational History  . Not on file  Tobacco Use  . Smoking status: Former SResearch scientist (life sciences) . Smokeless tobacco: Never Used  Vaping Use  . Vaping Use: Never used  Substance and Sexual Activity  . Alcohol use: Yes    Comment: rarely  . Drug use: Never  . Sexual activity: Not Currently  Other Topics Concern  . Not on file  Social History Narrative  . Not on file   Social Determinants of Health   Financial Resource Strain: Low Risk   . Difficulty of Paying Living Expenses: Not hard at all  Food Insecurity: No Food Insecurity  . Worried About RCharity fundraiserin the Last Year: Never true  . Ran Out of Food in the Last Year: Never true  Transportation Needs: No Transportation Needs  . Lack of Transportation (Medical): No  . Lack  of Transportation (Non-Medical): No  Physical Activity: Insufficiently Active  . Days of Exercise per Week: 3 days  . Minutes of Exercise per Session: 30 min  Stress: No Stress Concern Present  . Feeling of Stress : Not at all  Social Connections: Unknown  . Frequency of Communication with Friends and Family: Three times a week  . Frequency of Social Gatherings with Friends and Family: Twice a week  . Attends Religious Services: Never  . Active Member of Clubs or Organizations: No  . Attends Archivist Meetings: Never  . Marital Status: Patient refused  Intimate Partner Violence: Not At Risk  . Fear of  Current or Ex-Partner: No  . Emotionally Abused: No  . Physically Abused: No  . Sexually Abused: No    Outpatient Medications Prior to Visit  Medication Sig Dispense Refill  . aspirin 81 MG chewable tablet Chew 1 tablet (81 mg total) by mouth daily.    Marland Kitchen atorvastatin (LIPITOR) 80 MG tablet Take 80 mg by mouth daily.    . B Complex-C (B-COMPLEX WITH VITAMIN C) tablet Take 1 tablet by mouth daily.    . blood glucose meter kit and supplies KIT Dispense based on patient and insurance preference. Use up to four times daily as directed. (FOR ICD-9 250.00, 250.01). 1 each 0  . carvedilol (COREG) 6.25 MG tablet Take 1 tablet (6.25 mg total) by mouth 2 (two) times daily with a meal. 180 tablet 3  . empagliflozin ([name]) 10 MG TABS tablet Take 10 mg by mouth daily before breakfast. 90 tablet 3  . Lancet Device MISC 1 Units by Does not apply route daily before breakfast. 90 each 0  . Lancets (ONETOUCH DELICA PLUS [id number]) MISC USE AS DIRECTED DAILY BEFORE BREAKFAST 100 each 3  . ONETOUCH ULTRA test strip USE TO CHECK BLOOD GLUCOSE UP TO FOUR TIMES DAILY AS DIRECTED 300 strip 1  . sacubitril-valsartan (ENTRESTO) 97-103 MG Take 1 tablet by mouth 2 (two) times daily. 60 tablet 6  . tamsulosin (FLOMAX) 0.4 MG CAPS capsule TAKE 1 CAPSULE(0.4 MG) BY MOUTH DAILY AFTER SUPPER 90 capsule 2   No facility-administered medications prior to visit.    No Known Allergies  ROS Review of Systems  Constitutional: Negative.   HENT: Negative.   Eyes: Negative.   Respiratory: Negative.   Cardiovascular: Negative.   Gastrointestinal: Negative.   Genitourinary: Negative.   Musculoskeletal: Negative.   Skin: Negative.   Neurological: Negative.   Psychiatric/Behavioral: Negative.       Objective:    Physical Exam Constitutional:      General: He is not in acute distress.    Appearance: Normal appearance. He is normal weight. He is not ill-appearing, toxic-appearing or diaphoretic.  Cardiovascular:       Rate and Rhythm: Normal rate and regular rhythm.     Heart sounds: Normal heart sounds. No murmur heard.  No friction rub. No gallop.   Pulmonary:     Effort: Pulmonary effort is normal. No respiratory distress.     Breath sounds: Normal breath sounds. No stridor. No wheezing, rhonchi or rales.  Chest:     Chest wall: No tenderness.  Neurological:     General: No focal deficit present.     Mental Status: He is alert and oriented to person, place, and time. Mental status is at baseline.  Psychiatric:        Mood and Affect: Mood normal.        Behavior: Behavior normal.  Thought Content: Thought content normal.        Judgment: Judgment normal.     BP 114/63   Pulse (!) 53   Temp 97.9 F (36.6 C) (Temporal)   Resp 18   Ht _0  (1.702 m)   Wt 188 lb 9.6 oz (85.5 kg)   SpO2 98%   BMI 29.54 kg/m  Wt Readings from Last 3 Encounters:  06/06/20 188 lb 9.6 oz (85.5 kg)  04/09/20 184 lb (83.5 kg)  12/07/19 186 lb 3.2 oz (84.5 kg)     Health Maintenance Due  Topic Date Due  . OPHTHALMOLOGY EXAM  Never done  . URINE MICROALBUMIN  Never done    There are no preventive care reminders to display for this patient.  No results found for: TSH Lab Results  Component Value Date   WBC 9.8 09/07/2019   HGB 12.4 (L) 09/07/2019   HCT 37.5 09/07/2019   MCV 96 09/07/2019   PLT 394 09/07/2019   Lab Results  Component Value Date   NA 136 04/16/2020   K 4.1 04/16/2020   CO2 25 04/16/2020   GLUCOSE 161 (H) 04/16/2020   BUN 25 (H) 04/16/2020   CREATININE 1.23 04/16/2020   BILITOT 0.4 09/07/2019   ALKPHOS 101 09/07/2019   AST 20 09/07/2019   ALT 24 09/07/2019   PROT 7.1 09/07/2019   ALBUMIN 4.3 09/07/2019   CALCIUM 8.9 04/16/2020   ANIONGAP 7 04/16/2020   Lab Results  Component Value Date   CHOL 119 09/07/2019   Lab Results  Component Value Date   HDL 43 09/07/2019   Lab Results  Component Value Date   LDLCALC 50 09/07/2019   Lab Results  Component  Value Date   TRIG 150 (H) 09/07/2019   Lab Results  Component Value Date   CHOLHDL 2.8 09/07/2019   Lab Results  Component Value Date   HGBA1C 6.2 (A) 12/07/2019      Assessment & Plan:   Problem List Items Addressed This Visit      Endocrine   Controlled type 2 diabetes mellitus with hyperglycemia, without long-term current use of insulin (HCC) - Primary   Relevant Orders   POCT glycosylated hemoglobin (Hb A1C)   Microalbumin, urine    Other Visit Diagnoses    Flu vaccine need       Relevant Orders   Flu Vaccine QUAD High Dose(Fluad) (Completed)      No orders of the defined types were placed in this encounter.   Follow-up: No follow-ups on file.   PLAN  a1c at 6.1, continues to have good control  Continue jardiance. Want to maintain the benefits of CV protection that this med can offer.  Pt to return to clinic to drop off urine sample for urine microalbumin.  Return in 6 mo for t2dm follow up, sooner if needed  Patient encouraged to call clinic with any questions, comments, or concerns.  [name], NP

## 2020-06-10 LAB — MICROALBUMIN / CREATININE URINE RATIO
Creatinine, Urine: 45.5 mg/dL
Microalb/Creat Ratio: 7 mg/g creat (ref 0–29)
Microalbumin, Urine: 3.3 ug/mL

## 2020-06-23 ENCOUNTER — Other Ambulatory Visit (HOSPITAL_COMMUNITY): Payer: Self-pay | Admitting: *Deleted

## 2020-06-23 MED ORDER — CARVEDILOL 6.25 MG PO TABS: 6.2500 mg | ORAL_TABLET | Freq: Two times a day (BID) | ORAL | 3 refills | Status: DC

## 2020-07-04 ENCOUNTER — Other Ambulatory Visit: Payer: Self-pay | Admitting: Registered Nurse

## 2020-08-05 ENCOUNTER — Telehealth (HOSPITAL_COMMUNITY): Payer: Self-pay | Admitting: Pharmacy Technician

## 2020-08-05 NOTE — Telephone Encounter (Signed)
It's time to re-enroll patient to receive medication assistance for Entresto from [employer]. Left voicemail for patient to call me back so that we can start the re-enrollment process.  [name], CPhT

## 2020-08-18 ENCOUNTER — Ambulatory Visit (INDEPENDENT_AMBULATORY_CARE_PROVIDER_SITE_OTHER): Payer: PPO

## 2020-08-18 DIAGNOSIS — I255 Ischemic cardiomyopathy: Secondary | ICD-10-CM

## 2020-08-18 LAB — CUP PACEART REMOTE DEVICE CHECK
Battery Remaining Longevity: 128 mo
Battery Voltage: 3.02 V
Brady Statistic RV Percent Paced: 0.02 %
Date Time Interrogation Session: 20211227033522
HighPow Impedance: 61 Ohm
Implantable Lead Implant Date: 20201224
Implantable Lead Location: 753860
Implantable Pulse Generator Implant Date: 20201224
Lead Channel Impedance Value: 247 Ohm
Lead Channel Impedance Value: 304 Ohm
Lead Channel Pacing Threshold Amplitude: 0.75 V
Lead Channel Pacing Threshold Pulse Width: 0.4 ms
Lead Channel Sensing Intrinsic Amplitude: 7.5 mV
Lead Channel Sensing Intrinsic Amplitude: 7.5 mV
Lead Channel Setting Pacing Amplitude: 2 V
Lead Channel Setting Pacing Pulse Width: 0.4 ms
Lead Channel Setting Sensing Sensitivity: 0.3 mV

## 2020-08-25 ENCOUNTER — Telehealth (HOSPITAL_COMMUNITY): Payer: Self-pay | Admitting: Licensed Clinical Social Worker

## 2020-08-25 NOTE — Telephone Encounter (Signed)
CSW received call from pt daughter inquiring what they need to do to re-enroll in [employer].  Pt dtr confirms that they received notice from [employer] regarding re-enrollment which included new application.  CSW informed pt they need to fill out patient portion then bring in to clinic addressed to [facility] so we could complete the physician portion.  No further questions at this time- will continue to follow and assist as needed  Jorge Ny, [facility] [facility] Desk#: [phone] Cell#: [phone]

## 2020-08-26 ENCOUNTER — Telehealth (HOSPITAL_COMMUNITY): Payer: Self-pay | Admitting: Pharmacy Technician

## 2020-08-26 NOTE — Telephone Encounter (Signed)
Spoke with the patient's daughter. They received an application from [employer]. They are going to fill in the patient's portion and I will have the provider fill in his portion.  Will fax once all signatures and paperwork are received.

## 2020-08-28 ENCOUNTER — Other Ambulatory Visit: Payer: Self-pay

## 2020-08-28 ENCOUNTER — Ambulatory Visit: Payer: PPO | Admitting: Cardiology

## 2020-08-28 ENCOUNTER — Encounter: Payer: Self-pay | Admitting: Cardiology

## 2020-08-28 VITALS — BP 104/60 | HR 65 | Ht 67.0 in | Wt 192.8 lb

## 2020-08-28 DIAGNOSIS — I255 Ischemic cardiomyopathy: Secondary | ICD-10-CM

## 2020-08-28 NOTE — Progress Notes (Signed)
Electrophysiology Office Note   Date:  08/28/2020   ID:  Lawrence Olson, DOB 11-Nov-1950, MRN [id number]  PCP:  [name], NP  Cardiologist:  Bensimhon Primary Electrophysiologist:  Lawrence Remmers Meredith Leeds, MD    Chief Complaint: CHF   History of Present Illness: Lawrence Olson is a 70 y.o. male who is being seen today for the evaluation of CHF at the request of [name], NP. Presenting today for electrophysiology evaluation.  He has a history significant for coronary artery disease, chronic systolic heart failure, and a VF arrest.  On 04/16/2019 he has experienced a witnessed VF arrest while at work.  He had bystander CPR for 20 minutes.  He had emergent left heart catheterization but his LAD was occluded and not able to be reopened.  Impella was placed and he was taken to the ICU.  His ejection fraction was found to be 25 to 30%.  He had a protracted course with respiratory failure, renal failure, and anoxic brain injury.  He was discharged with a LifeVest.  He is now status post Medtronic ICD implanted 08/16/2019.  Today, denies symptoms of palpitations, chest pain, shortness of breath, orthopnea, PND, lower extremity edema, claudication, dizziness, presyncope, syncope, bleeding, or neurologic sequela. The patient is tolerating medications without difficulties.  Since last being seen he has done well.  He has no chest pain or shortness of breath.  He is able do all of his daily activities.  He walks quite a bit, multiple times a day and has no symptoms.    Past Medical History:  Diagnosis Date  . Acute blood loss anemia   . Acute on chronic combined systolic and diastolic congestive heart failure ([place])   . Acute respiratory failure ([place])   . Acute ST elevation myocardial infarction (STEMI) involving left anterior descending (LAD) coronary artery ([place]) 04/16/2019  . AKI (acute kidney injury) ([place])   . Anoxic brain injury ([place])   . Cardiac arrest ([place]) 04/16/2019  . Cardiogenic  shock ([place]) 04/16/2019  . Controlled type 2 diabetes mellitus with hyperglycemia ([place])   . Coronary artery disease involving native coronary artery of native heart without angina pectoris   . Jaundice    needed hospitalization in [place]  . Leukocytosis   . Myocardial infarction ([place])   . Thrombocytosis   . VT (ventricular tachycardia) ([place])    Past Surgical History:  Procedure Laterality Date  . ICD IMPLANT N/A 08/16/2019   Procedure: ICD IMPLANT;  Surgeon: Constance Haw, MD;  Location: [facility];  Service: Cardiovascular;  Laterality: N/A;  . RIGHT HEART CATH AND CORONARY ANGIOGRAPHY N/A 04/16/2019   Procedure: RIGHT HEART CATH AND CORONARY ANGIOGRAPHY;  Surgeon: Troy Sine, MD;  Location: [facility];  Service: Cardiovascular;  Laterality: N/A;  . VENTRICULAR ASSIST DEVICE INSERTION N/A 04/16/2019   Procedure: VENTRICULAR ASSIST DEVICE INSERTION;  Surgeon: Troy Sine, MD;  Location: [facility];  Service: Cardiovascular;  Laterality: N/A;     Current Outpatient Medications  Medication Sig Dispense Refill  . aspirin 81 MG chewable tablet Chew 1 tablet (81 mg total) by mouth daily.    Marland Kitchen atorvastatin (LIPITOR) 80 MG tablet Take 80 mg by mouth daily.    . B Complex-C (B-COMPLEX WITH VITAMIN C) tablet Take 1 tablet by mouth daily.    . blood glucose meter kit and supplies KIT Dispense based on patient and insurance preference. Use up to four times daily as directed. (FOR ICD-9 250.00, 250.01).  1 each 0  . carvedilol (COREG) 6.25 MG tablet Take 1 tablet (6.25 mg total) by mouth 2 (two) times daily with a meal. 180 tablet 3  . empagliflozin ([name]) 10 MG TABS tablet Take 10 mg by mouth daily before breakfast. 90 tablet 3  . Lancet Device MISC 1 Units by Does not apply route daily before breakfast. 90 each 0  . Lancets (ONETOUCH DELICA PLUS [id number]) MISC USE AS DIRECTED DAILY BEFORE BREAKFAST 100 each 3  . ONETOUCH ULTRA test strip USE TO CHECK  BLOOD GLUCOSE UP TO FOUR TIMES DAILY AS DIRECTED 300 strip 1  . sacubitril-valsartan (ENTRESTO) 97-103 MG Take 1 tablet by mouth 2 (two) times daily. 60 tablet 6  . tamsulosin (FLOMAX) 0.4 MG CAPS capsule TAKE 1 CAPSULE(0.4 MG) BY MOUTH DAILY AFTER SUPPER 90 capsule 2   No current facility-administered medications for this visit.    Allergies:   Patient has no known allergies.   Social History:  The patient  reports that he has quit smoking. He has never used smokeless tobacco. He reports current alcohol use. He reports that he does not use drugs.   Family History:  The patient's family history includes Healthy in his brother and sister.   ROS:  Please see the history of present illness.   Otherwise, review of systems is positive for none.   All other systems are reviewed and negative.   PHYSICAL EXAM: VS:  BP 104/60   Pulse 65   Ht '5\' 7"'  (1.702 m)   Wt 192 lb 12.8 oz (87.5 kg)   SpO2 94%   BMI 30.20 kg/m  , BMI Body mass index is 30.2 kg/m. GEN: Well nourished, well developed, in no acute distress  HEENT: normal  Neck: no JVD, carotid bruits, or masses Cardiac: RRR; no murmurs, rubs, or gallops,no edema  Respiratory:  clear to auscultation bilaterally, normal work of breathing GI: soft, nontender, nondistended, + BS MS: no deformity or atrophy  Skin: warm and dry, device site well healed Neuro:  Strength and sensation are intact Psych: euthymic mood, full affect  EKG:  EKG is ordered today. Personal review of the ekg ordered shows sinus rhythm, rate 50  Personal review of the device interrogation today. Results in [place]: 09/07/2019: ALT 24; Hemoglobin 12.4; Platelets 394 04/09/2020: B Natriuretic Peptide 85.6 04/16/2020: BUN 25; Creatinine, Ser 1.23; Potassium 4.1; Sodium 136    Lipid Panel     Component Value Date/Time   CHOL 119 09/07/2019 0943   TRIG 150 (H) 09/07/2019 0943   HDL 43 09/07/2019 0943   CHOLHDL 2.8 09/07/2019 0943   CHOLHDL 3.7  04/16/2019 1335   VLDL 20 04/16/2019 1335   LDLCALC 50 09/07/2019 0943     Wt Readings from Last 3 Encounters:  08/28/20 192 lb 12.8 oz (87.5 kg)  06/06/20 188 lb 9.6 oz (85.5 kg)  04/09/20 184 lb (83.5 kg)      Other studies Reviewed: Additional studies/ records that were reviewed today include: TTE 07/30/19  Review of the above records today demonstrates:   1. Left ventricular ejection fraction, by visual estimation, is 30 to 35%. The left ventricle has moderate to severely decreased function. There is moderately increased left ventricular hypertrophy.  2. Elevated left atrial and left ventricular end-diastolic pressures.  3. Left ventricular diastolic parameters are consistent with Grade I diastolic dysfunction (impaired relaxation).  4. The left ventricle demonstrates global hypokinesis.  5. There is a false tendon in the left  ventricle.  6. Global right ventricle has normal systolic function.The right ventricular size is normal. No increase in right ventricular wall thickness.  7. Left atrial size was mildly dilated.  8. Right atrial size was normal.  9. The mitral valve is normal in structure. No evidence of mitral valve regurgitation. No evidence of mitral stenosis. 10. The tricuspid valve is normal in structure. Tricuspid valve regurgitation is mild. 11. The aortic valve is normal in structure. Aortic valve regurgitation is mild. No evidence of aortic valve sclerosis or stenosis. 12. The pulmonic valve was normal in structure. Pulmonic valve regurgitation is not visualized. 13. The inferior vena cava is normal in size with greater than 50% respiratory variability, suggesting right atrial pressure of 3 mmHg. 14. Since the last study on 04/24/2019 LVEF has improved from 25-30% to 30-35%. Cardiac MRI is recommended for further evaluation.   ASSESSMENT AND PLAN:  1. chronic systolic heart failure due to ischemic cardiomyopathy: Currently on carvedilol, Aldactone, Entresto,  [name].  Status post Medtronic ICD.  No signs of volume overload.  No changes.    2. VF arrest: Status post Medtronic single-chamber ICD implanted 08/16/2019.  Device functioning appropriately.  No changes.    3. coronary artery disease status post anterior MI: Unable to open the LAD CTO.  No acute ischemia.  Plan per primary cardiology.    Current medicines are reviewed at length with the patient today.   The patient does not have concerns regarding his medicines.  The following changes were made today:   Labs/ tests ordered today include:  Orders Placed This Encounter  Procedures  . EKG 12-Lead     Disposition:   FU with Naquita Nappier 12 months  Signed, Dondrell Loudermilk Meredith Leeds, MD  08/28/2020 3:08 PM     [address] [address] [phone] (office) [phone] (fax)

## 2020-09-01 NOTE — Progress Notes (Signed)
Remote ICD transmission.   

## 2020-09-06 ENCOUNTER — Other Ambulatory Visit (HOSPITAL_COMMUNITY): Payer: Self-pay | Admitting: Internal Medicine

## 2020-09-09 ENCOUNTER — Telehealth (HOSPITAL_COMMUNITY): Payer: Self-pay | Admitting: Pharmacist

## 2020-09-09 NOTE — Telephone Encounter (Signed)
Sent in [employer] application to [employer] for [employer].    Application pending, will continue to follow.   Audry Riles, PharmD, BCPS, BCCP, CPP Heart Failure Clinic Pharmacist [phone]

## 2020-10-06 ENCOUNTER — Other Ambulatory Visit: Payer: Self-pay | Admitting: Registered Nurse

## 2020-10-08 ENCOUNTER — Other Ambulatory Visit: Payer: Self-pay | Admitting: Registered Nurse

## 2020-10-23 ENCOUNTER — Ambulatory Visit (HOSPITAL_COMMUNITY)
Admission: RE | Admit: 2020-10-23 | Discharge: 2020-10-23 | Disposition: A | Discharge status: Home / Self Care | Payer: PPO | Source: Ambulatory Visit | Attending: Internal Medicine | Admitting: Internal Medicine

## 2020-10-23 ENCOUNTER — Encounter (HOSPITAL_COMMUNITY): Payer: Self-pay | Admitting: Internal Medicine

## 2020-10-23 ENCOUNTER — Ambulatory Visit (HOSPITAL_BASED_OUTPATIENT_CLINIC_OR_DEPARTMENT_OTHER)
Admission: RE | Admit: 2020-10-23 | Discharge: 2020-10-23 | Disposition: A | Discharge status: Home / Self Care | Payer: PPO | Source: Ambulatory Visit | Attending: Internal Medicine | Admitting: Internal Medicine

## 2020-10-23 ENCOUNTER — Other Ambulatory Visit: Payer: Self-pay

## 2020-10-23 ENCOUNTER — Telehealth (HOSPITAL_COMMUNITY): Payer: Self-pay | Admitting: Pharmacy Technician

## 2020-10-23 VITALS — BP 120/78 | HR 50 | Wt 191.4 lb

## 2020-10-23 DIAGNOSIS — N1831 Chronic kidney disease, stage 3a: Secondary | ICD-10-CM | POA: Diagnosis not present

## 2020-10-23 DIAGNOSIS — Z7982 Long term (current) use of aspirin: Secondary | ICD-10-CM | POA: Diagnosis not present

## 2020-10-23 DIAGNOSIS — Z87891 Personal history of nicotine dependence: Secondary | ICD-10-CM | POA: Insufficient documentation

## 2020-10-23 DIAGNOSIS — I252 Old myocardial infarction: Secondary | ICD-10-CM | POA: Diagnosis not present

## 2020-10-23 DIAGNOSIS — Z95810 Presence of automatic (implantable) cardiac defibrillator: Secondary | ICD-10-CM | POA: Insufficient documentation

## 2020-10-23 DIAGNOSIS — Z7984 Long term (current) use of oral hypoglycemic drugs: Secondary | ICD-10-CM | POA: Insufficient documentation

## 2020-10-23 DIAGNOSIS — I255 Ischemic cardiomyopathy: Secondary | ICD-10-CM | POA: Insufficient documentation

## 2020-10-23 DIAGNOSIS — N184 Chronic kidney disease, stage 4 (severe): Secondary | ICD-10-CM | POA: Diagnosis not present

## 2020-10-23 DIAGNOSIS — E1122 Type 2 diabetes mellitus with diabetic chronic kidney disease: Secondary | ICD-10-CM | POA: Diagnosis not present

## 2020-10-23 DIAGNOSIS — Z79899 Other long term (current) drug therapy: Secondary | ICD-10-CM | POA: Insufficient documentation

## 2020-10-23 DIAGNOSIS — I5022 Chronic systolic (congestive) heart failure: Secondary | ICD-10-CM

## 2020-10-23 DIAGNOSIS — Z8674 Personal history of sudden cardiac arrest: Secondary | ICD-10-CM | POA: Diagnosis not present

## 2020-10-23 DIAGNOSIS — I2510 Atherosclerotic heart disease of native coronary artery without angina pectoris: Secondary | ICD-10-CM | POA: Diagnosis not present

## 2020-10-23 LAB — BASIC METABOLIC PANEL
Anion gap: 8 (ref 5–15)
BUN: 24 mg/dL — ABNORMAL HIGH (ref 8–23)
CO2: 26 mmol/L (ref 22–32)
Calcium: 9.5 mg/dL (ref 8.9–10.3)
Chloride: 106 mmol/L (ref 98–111)
Creatinine, Ser: 1.35 mg/dL — ABNORMAL HIGH (ref 0.61–1.24)
GFR, Estimated: 56 mL/min — ABNORMAL LOW (ref >60)
Glucose, Bld: 94 mg/dL (ref 70–99)
Potassium: 4.6 mmol/L (ref 3.5–5.1)
Sodium: 140 mmol/L (ref 135–145)

## 2020-10-23 LAB — ECHOCARDIOGRAM COMPLETE
Area-P 1/2: 3.27 cm2
Calc EF: 45.8 %
S' Lateral: 3.2 cm
Single Plane A2C EF: 43.8 %
Single Plane A4C EF: 48.7 %

## 2020-10-23 LAB — BRAIN NATRIURETIC PEPTIDE: B Natriuretic Peptide: 99.4 pg/mL (ref 0.0–100.0)

## 2020-10-23 NOTE — Telephone Encounter (Signed)
Called [employer] to check the status of the patient's application. Representative stated that the application is still under review. Was able to set a 90 day fill that will be delivered to the patient on Tuesday, March 8 via [place].

## 2020-10-23 NOTE — Addendum Note (Signed)
Encounter addended by: Scarlette Calico, RN on: 10/23/2020 12:22 PM  Actions taken: Order list changed, Diagnosis association updated, Clinical Note Signed, Charge Capture section accepted

## 2020-10-23 NOTE — Progress Notes (Signed)
  Echocardiogram 2D Echocardiogram has been performed.  Lawrence Olson 10/23/2020, 11:22 AM

## 2020-10-23 NOTE — Patient Instructions (Addendum)
Entresto application is still pending with [employer], we will notify you once approved. In the mean time we have given you samples of:   Entresto 49/51 mg tablets== take 2 tabs Twice daily   Labs done today, your results will be available in MyChart, we will contact you for abnormal readings.  Please call our office in November 2022 to schedule your follow up appointment  If you have any questions or concerns before your next appointment please send [place] a message through [place] or call our office at [phone].    TO LEAVE A MESSAGE FOR THE NURSE SELECT OPTION 2, PLEASE LEAVE A MESSAGE INCLUDING: . YOUR NAME . DATE OF BIRTH . CALL BACK NUMBER . REASON FOR CALL**this is important as we prioritize the call backs  [place] AS LONG AS YOU CALL BEFORE 4:00 PM  At the [facility], you and your health needs are our priority. As part of our continuing mission to provide you with exceptional heart care, we have created designated Provider Care Teams. These Care Teams include your primary Cardiologist (physician) and Advanced Practice Providers (APPs- Physician Assistants and Nurse Practitioners) who all work together to provide you with the care you need, when you need it.   You may see any of the following providers on your designated Care Team at your next follow up: Marland Kitchen Dr Glori Bickers . Dr Loralie Champagne . Dr Vickki Muff . Darrick Grinder, NP . Lyda Jester, [place] . Audry Riles, PharmD   Please be sure to bring in all your medications bottles to every appointment.

## 2020-10-23 NOTE — Progress Notes (Addendum)
ADVANCED HF CLINIC NOTE    HF Cardiologist: Obadiah Dennard  HPI:  Lawrence Olson is a 70 year old male from [place] with h/o CAD, systolic HF and VF cardiac arrest.   He had no known medical problems until 04/16/19 when he experienced witnessed VF arrest at work. Had bystander CPR x 20 mins with defib and intubation in field. He was taken to cath lab for emergent catheterization. Attempts at opening LAD unsuccessful (felt to be chronic). Impella placed and he was taken to CCU. EF 25-30%. Had protracted course with respiratory failure, renal failure (requiring CVVHD) and anoxic brain injury. There was also a question of acute amio pneumonitis with ESR> 100.   Extubated 05/01/19. Mental status and renal function improved. Discharged to CIR. Diw well in CR with dramatic improvement. Lifevest placed. Creatinine plateaued at 2.4 so ACE/ARB/ARNI not started.   Had ICD placed 08/16/19 by Dr. Curt Bears   Here with his daughter. Feels great. Walking 30-45 mins every day with his wife. No CP, SOB, edema. No dizziness. No problems with meds. No ICD firings   Echo today 10/23/20: EF 40-45% RV ok Personally reviewed   Echo 12/20 EF 30-35% Personally reviewed   ROS: All systems negative except as listed in HPI, PMH and Problem List.  SH:  Social History   Socioeconomic History   Marital status: Married    Spouse name: Not on file   Number of children: Not on file   Years of education: Not on file   Highest education level: Not on file  Occupational History   Not on file  Tobacco Use   Smoking status: Former Smoker   Smokeless tobacco: Never Used  Scientific laboratory technician Use: Never used  Substance and Sexual Activity   Alcohol use: Yes    Comment: rarely   Drug use: Never   Sexual activity: Not Currently  Other Topics Concern   Not on file  Social History Narrative   Not on file   Social Determinants of Health   Financial Resource Strain: Not on file  Food Insecurity: Not on  file  Transportation Needs: Not on file  Physical Activity: Not on file  Stress: Not on file  Social Connections: Not on file  Intimate Partner Violence: Not on file    FH:  Family History  Problem Relation Age of Onset   Healthy Brother    Healthy Sister     Past Medical History:  Diagnosis Date   Acute blood loss anemia    Acute on chronic combined systolic and diastolic congestive heart failure (HCC)    Acute respiratory failure (HCC)    Acute ST elevation myocardial infarction (STEMI) involving left anterior descending (LAD) coronary artery ([place]) 04/16/2019   AKI (acute kidney injury) ([place])    Anoxic brain injury ([place])    Cardiac arrest ([place]) 04/16/2019   Cardiogenic shock ([place]) 04/16/2019   Controlled type 2 diabetes mellitus with hyperglycemia ([place])    Coronary artery disease involving native coronary artery of native heart without angina pectoris    Jaundice    needed hospitalization in [place]   Leukocytosis    Myocardial infarction ([place])    Thrombocytosis    VT (ventricular tachycardia) (HCC)     Current Outpatient Medications  Medication Sig Dispense Refill   aspirin 81 MG chewable tablet Chew 1 tablet (81 mg total) by mouth daily.     atorvastatin (LIPITOR) 80 MG tablet TAKE 1 TABLET(80 MG) BY MOUTH DAILY AT 6 PM  30 tablet 6   B Complex-C (B-COMPLEX WITH VITAMIN C) tablet Take 1 tablet by mouth daily.     blood glucose meter kit and supplies KIT Dispense based on patient and insurance preference. Use up to four times daily as directed. (FOR ICD-9 250.00, 250.01). 1 each 0   carvedilol (COREG) 6.25 MG tablet Take 1 tablet (6.25 mg total) by mouth 2 (two) times daily with a meal. 180 tablet 3   empagliflozin ([name]) 10 MG TABS tablet Take 10 mg by mouth daily before breakfast. 90 tablet 3   Lancet Device MISC 1 Units by Does not apply route daily before breakfast. 90 each 0   Lancets (ONETOUCH DELICA PLUS [id number]) MISC USE AS DIRECTED  DAILY BEFORE BREAKFAST 100 each 3   ONETOUCH ULTRA test strip USE TO CHECK BLOOD GLUCOSE UP TO FOUR TIMES DAILY AS DIRECTED 300 strip 1   sacubitril-valsartan (ENTRESTO) 97-103 MG Take 1 tablet by mouth 2 (two) times daily. 60 tablet 6   tamsulosin (FLOMAX) 0.4 MG CAPS capsule TAKE 1 CAPSULE(0.4 MG) BY MOUTH DAILY AFTER SUPPER 90 capsule 0   No current facility-administered medications for this encounter.    Vitals:   10/23/20 1136  BP: 120/78  Pulse: (!) 50  SpO2: 96%  Weight: 86.8 kg (191 lb 6.4 oz)    PHYSICAL EXAM:  General:  Well appearing. No resp difficulty HEENT: normal Neck: supple. no JVD. Carotids 2+ bilat; no bruits. No lymphadenopathy or thryomegaly appreciated. Cor: PMI nondisplaced. Loletha Grayer regular. No rubs, gallops or murmurs. Lungs: clear Abdomen: soft, nontender, nondistended. No hepatosplenomegaly. No bruits or masses. Good bowel sounds. Extremities: no cyanosis, clubbing, rash, edema Neuro: alert & orientedx3, cranial nerves grossly intact. moves all 4 extremities w/o difficulty. Affect pleasant   ASSESSMENT & PLAN:  1. Chronic systolic HF - Echo 1/24 EF 25-30% in setting of cardiac arrest and severe iCM - Echo 12/20 EF 30-35% - Echo today 10/23/20: EF 40-45% RV ok Personally reviewed - s/p MDT ICD (Dr. Lennie Odor)  - ICD interrogated personally. Fluid looks good. No AF/VT. No shocks. Personally reviewed - Doing great. NYHA I. Volume status looks great.  - Continue  carvedilol t6.25 bid No room to titrate withbradycardia  - Continue spiro 12.5 daily  - Continue Entresto to 97/103 bid - Continue [name] 10   2. CAD s/p recent anterior MI  - treated medically as unable to open LAD CTO - No s/s ischemia Films reviewed with IC team and not options for PCI - Continue ASA/statin/[name]  3. H/o VF arrest - s/p MDT ICD. As above  4. DM2  - Continue [name]. Meet with PharmD regarding cost   5. CKD 3a - last creatinine 1.3 - check labs  today   Glori Bickers, MD  11:38 AM

## 2020-10-23 NOTE — Addendum Note (Signed)
Encounter addended by: Jolaine Artist, MD on: 10/23/2020 12:10 PM  Actions taken: Clinical Note Signed

## 2020-10-23 NOTE — Progress Notes (Signed)
Medication Samples have been provided to the patient.  Drug name: Delene Loll       Strength: 49/51 mg        Qty: 2  LOT:  [id number] Exp.Date: 07/2022  Dosing instructions: Take 1 tablet Twice daily  Stanford Scotland 12:22 PM 10/23/2020

## 2020-10-23 NOTE — Addendum Note (Signed)
Encounter addended by: Stanford Scotland, RN on: 10/23/2020 12:25 PM  Actions taken: Clinical Note Signed

## 2020-11-04 NOTE — Telephone Encounter (Signed)
Advanced Heart Failure Patient Advocate Encounter   Patient was approved to receive Entresto from [employer]  Patient ID: [id number] Effective dates: 11/03/20 through 08/22/21  [name], CPhT

## 2020-11-10 ENCOUNTER — Telehealth (HOSPITAL_COMMUNITY): Payer: Self-pay | Admitting: Pharmacy Technician

## 2020-11-10 NOTE — Telephone Encounter (Signed)
Received a call from the patient's daughter. She stated that there was a renewal that her dad received and needed to send back in. I called her and left a message to follow up with me.  [name], CPhT

## 2020-11-13 ENCOUNTER — Telehealth (HOSPITAL_COMMUNITY): Payer: Self-pay | Admitting: Pharmacy Technician

## 2020-11-13 NOTE — Telephone Encounter (Signed)
Spoke with patient's daughter, she was inquiring into [place] assistance. He is already approved through the end of 2022.  [name], CPhT

## 2020-11-16 ENCOUNTER — Other Ambulatory Visit: Payer: Self-pay | Admitting: Registered Nurse

## 2020-11-17 ENCOUNTER — Ambulatory Visit (INDEPENDENT_AMBULATORY_CARE_PROVIDER_SITE_OTHER): Payer: PPO

## 2020-11-17 DIAGNOSIS — I469 Cardiac arrest, cause unspecified: Secondary | ICD-10-CM | POA: Diagnosis not present

## 2020-11-17 LAB — CUP PACEART REMOTE DEVICE CHECK
Battery Remaining Longevity: 126 mo
Battery Voltage: 3.01 V
Brady Statistic RV Percent Paced: 0.05 %
Date Time Interrogation Session: 20220328001804
HighPow Impedance: 70 Ohm
Implantable Lead Implant Date: 20201224
Implantable Lead Location: 753860
Implantable Pulse Generator Implant Date: 20201224
Lead Channel Impedance Value: 285 Ohm
Lead Channel Impedance Value: 342 Ohm
Lead Channel Pacing Threshold Amplitude: 0.75 V
Lead Channel Pacing Threshold Pulse Width: 0.4 ms
Lead Channel Sensing Intrinsic Amplitude: 7.75 mV
Lead Channel Sensing Intrinsic Amplitude: 7.75 mV
Lead Channel Setting Pacing Amplitude: 2 V
Lead Channel Setting Pacing Pulse Width: 0.4 ms
Lead Channel Setting Sensing Sensitivity: 0.3 mV

## 2020-11-18 ENCOUNTER — Telehealth: Payer: Self-pay | Admitting: Cardiology

## 2020-11-18 DIAGNOSIS — I472 Ventricular tachycardia, unspecified: Secondary | ICD-10-CM

## 2020-11-18 NOTE — Telephone Encounter (Signed)
Spoke with pt daughter.  He is scheduled to see Oda Kilts on Thursday.  Confirmed with Dr. Curt Bears that he can have labwork competed then.

## 2020-11-18 NOTE — Telephone Encounter (Signed)
Attempted to reach pt daughter.  Need to have labwork completed.  Left VM requesting she call back.

## 2020-11-18 NOTE — Telephone Encounter (Signed)
Patient needs BMP and Mg due to ICD shock.

## 2020-11-18 NOTE — Telephone Encounter (Signed)
STAT if patient feels like he/she is going to faint   1) Are you dizzy now? no  2) Do you feel faint or have you passed out? Patient passed out yesterday at ~4:30 pm yesterday  3) Do you have any other symptoms? Daughter states patient had a nosebleed and was also bleeding from his mouth after he passed out    4) Have you checked your HR and BP (record if available)? no   Daughter also states that when he passed out he felt like his defibrillator shocked him yesterday after he passed out. He felt fine and did not want to go to the ER. Daughter wants to know if she needs to bring him in to see [place] or if he needs to go see Dr. Haroldine Laws. Please advise

## 2020-11-18 NOTE — Telephone Encounter (Signed)
Merlin transmission shows that pt did receive 1 round of ATP, followed by 2 separate shocks.    Spoke with pt daughter (DPR on file)  Advised pt did receive 2 shocks yesterday afternoon.  She reports that prior to shock pt had awoken from a nap and was in process of putting his socks on.  Pt reportedly passed out/ fell to floor.  By the time his spouse entered room, pt was standing up and reported to feel fine.  Moments later the pt was observed by spouse to have nose bleed and blood in mouth.  It is not clear if pt bit tongue or not.  Pt refused emergent care, due to feeling better.    Per pt daughter report pt continues to feel good today. Daughter confirms strict compliance with meds as ordered.   Educated on shock plan, stressing if pt receives another shock within 24 hours, he needs to be seen in ED regardless of how he is feeling.  Educated on DMV restrictions- No driving for 6 months.    Advised daughter I would have scheduling contact her to set up an appt with either Dr. Curt Bears or APP in the next couple of weeks.

## 2020-11-19 NOTE — Progress Notes (Signed)
Electrophysiology Office Note Date: 11/20/2020  ID:  Granger Chui, DOB 03-Aug-1951, MRN [id number]  PCP: [name], NP Primary Cardiologist: No primary care provider on file. Electrophysiologist: Will Meredith Leeds, MD   CC: Routine ICD follow-up  Lawrence Olson is a 70 y.o. male seen today for Will Meredith Leeds, MD for acute visit due to ICD therapy..  Since last being seen in our clinic the patient reports doing OK.   Pt was sitting on the edge of the bed putting his socks on 3/28 when he received 2 ICD shocks. He did have syncope and fall into the floor per pt report. He hit his face causing nose bleed, but otherwise felt OK. He refused emergent care as he was feeling better.  Pt denies any med non-compliance. Educated on no driving for 6 months.   He is feeling great today. Denies any issues. He never had any chest pain prior to his original arrest.He denies chest pain, palpitations, dyspnea, PND, orthopnea, nausea, vomiting, dizziness, syncope, edema, weight gain, or early satiety. He has not missed any medications, and is overall his USOH without issues.   Device History: Medtronic Single Chamber ICD implanted 07/2019 for Chronic systolic CHF and h/o VF arrest History of appropriate therapy: Yes History of AAD therapy: Yes, previously taken off of amiodarone due to pneumonitis requiring steroids.    Past Medical History:  Diagnosis Date  . Acute blood loss anemia   . Acute on chronic combined systolic and diastolic congestive heart failure ([place])   . Acute respiratory failure ([place])   . Acute ST elevation myocardial infarction (STEMI) involving left anterior descending (LAD) coronary artery ([place]) 04/16/2019  . AKI (acute kidney injury) ([place])   . Anoxic brain injury ([place])   . Cardiac arrest ([place]) 04/16/2019  . Cardiogenic shock ([place]) 04/16/2019  . Controlled type 2 diabetes mellitus with hyperglycemia ([place])   . Coronary artery disease involving native coronary artery of  native heart without angina pectoris   . Jaundice    needed hospitalization in [place]  . Leukocytosis   . Myocardial infarction ([place])   . Thrombocytosis   . VT (ventricular tachycardia) ([place])    Past Surgical History:  Procedure Laterality Date  . ICD IMPLANT N/A 08/16/2019   Procedure: ICD IMPLANT;  Surgeon: Constance Haw, MD;  Location: [facility];  Service: Cardiovascular;  Laterality: N/A;  . RIGHT HEART CATH AND CORONARY ANGIOGRAPHY N/A 04/16/2019   Procedure: RIGHT HEART CATH AND CORONARY ANGIOGRAPHY;  Surgeon: Troy Sine, MD;  Location: [facility];  Service: Cardiovascular;  Laterality: N/A;  . VENTRICULAR ASSIST DEVICE INSERTION N/A 04/16/2019   Procedure: VENTRICULAR ASSIST DEVICE INSERTION;  Surgeon: Troy Sine, MD;  Location: [facility];  Service: Cardiovascular;  Laterality: N/A;    Current Outpatient Medications  Medication Sig Dispense Refill  . aspirin 81 MG chewable tablet Chew 1 tablet (81 mg total) by mouth daily.    Marland Kitchen atorvastatin (LIPITOR) 80 MG tablet TAKE 1 TABLET(80 MG) BY MOUTH DAILY AT 6 PM 30 tablet 6  . B Complex-C (B-COMPLEX WITH VITAMIN C) tablet Take 1 tablet by mouth daily.    . blood glucose meter kit and supplies KIT Dispense based on patient and insurance preference. Use up to four times daily as directed. (FOR ICD-9 250.00, 250.01). 1 each 0  . carvedilol (COREG) 6.25 MG tablet Take 1 tablet (6.25 mg total) by mouth 2 (two) times daily with a meal. 180 tablet  3  . empagliflozin ([name]) 10 MG TABS tablet Take 10 mg by mouth daily before breakfast. 90 tablet 3  . isosorbide mononitrate (IMDUR) 30 MG 24 hr tablet Take 0.5 tablets (15 mg total) by mouth daily. 45 tablet 3  . Lancet Device MISC 1 Units by Does not apply route daily before breakfast. 90 each 0  . Lancets (ONETOUCH DELICA PLUS [id number]) MISC USE AS DIRECTED DAILY BEFORE BREAKFAST 100 each 3  . ONETOUCH ULTRA test strip USE TO CHECK BLOOD GLUCOSE UP  TO FOUR TIMES DAILY AS DIRECTED 300 strip 1  . sacubitril-valsartan (ENTRESTO) 97-103 MG Take 1 tablet by mouth 2 (two) times daily. 60 tablet 6  . tamsulosin (FLOMAX) 0.4 MG CAPS capsule TAKE 1 CAPSULE(0.4 MG) BY MOUTH DAILY AFTER SUPPER 90 capsule 0   No current facility-administered medications for this visit.    Allergies:   Patient has no known allergies.   Social History: Social History   Socioeconomic History  . Marital status: Married    Spouse name: Not on file  . Number of children: Not on file  . Years of education: Not on file  . Highest education level: Not on file  Occupational History  . Not on file  Tobacco Use  . Smoking status: Former Research scientist (life sciences)  . Smokeless tobacco: Never Used  Vaping Use  . Vaping Use: Never used  Substance and Sexual Activity  . Alcohol use: Yes    Comment: rarely  . Drug use: Never  . Sexual activity: Not Currently  Other Topics Concern  . Not on file  Social History Narrative  . Not on file   Social Determinants of Health   Financial Resource Strain: Not on file  Food Insecurity: Not on file  Transportation Needs: Not on file  Physical Activity: Not on file  Stress: Not on file  Social Connections: Not on file  Intimate Partner Violence: Not on file    Family History: Family History  Problem Relation Age of Onset  . Healthy Brother   . Healthy Sister     Review of Systems: All other systems reviewed and are otherwise negative except as noted above.   Physical Exam: Vitals:   11/20/20 1055  BP: 110/60  Pulse: (!) 52  SpO2: 95%  Weight: 190 lb (86.2 kg)  Height: '5\' 7"'  (1.702 m)     GEN- The patient is well appearing, alert and oriented x 3 today.   HEENT: normocephalic, atraumatic; sclera clear, conjunctiva pink; hearing intact; oropharynx clear; neck supple, no JVP Lymph- no cervical lymphadenopathy Lungs- Clear to ausculation bilaterally, normal work of breathing.  No wheezes, rales, rhonchi Heart- Regular rate  and rhythm, no murmurs, rubs or gallops, PMI not laterally displaced GI- soft, non-tender, non-distended, bowel sounds present, no hepatosplenomegaly Extremities- no clubbing or cyanosis. No edema; DP/PT/radial pulses 2+ bilaterally MS- no significant deformity or atrophy Skin- warm and dry, no rash or lesion; ICD pocket well healed Psych- euthymic mood, full affect Neuro- strength and sensation are intact  ICD interrogation- reviewed in detail today,  See PACEART report  EKG:  EKG is ordered today. The ekg ordered today shows sinus bradycardia 52 bpm, QRS 92 ms, PR interval. No new ST elevation or TWI.   Recent Labs: 10/23/2020: B Natriuretic Peptide 99.4; BUN 24; Creatinine, Ser 1.35; Potassium 4.6; Sodium 140   Wt Readings from Last 3 Encounters:  11/20/20 190 lb (86.2 kg)  10/23/20 191 lb 6.4 oz (86.8 kg)  08/28/20 192 lb 12.8  oz (87.5 kg)     Other studies Reviewed: Additional studies/ records that were reviewed today include: Previous EP and HF notes. Previous admission notes.   Assessment and Plan:  1.  Chronic systolic dysfunction s/p Medtronic single chamber ICD  euvolemic today Stable on an appropriate medical regimen Normal ICD function See Pace Art report  2. VT h/o VF arrest He has previously not tolerated amiodarone Could consider ranolazine / Mexitil if recurs, as it has been nearly two years since he has had VT/VF.  Pt had ATP during charging that failed. Increased CL to 84%. Pt had shock with a type 2 break. He briefly had fib after the first shock, causing detection of VF and charging. A second charge was delivered several beats after the patient had reverted to sinus. No way to program around this once device has committed to a charge for VF.   2. CAD s/p anterior MI - Unable to open LAD CTO He had unsuccessful and aborted attempt at PCI to his totally occluded ostial LAD due to inability to cross the lesion with multiple wires and catheters. EKG today without  changes as above.  Denies ischemic symptoms.  Will add imdur 15 mg in the event there is an ischemic component given his disease.   Current medicines are reviewed at length with the patient today.   The patient does not have concerns regarding his medicines.  The following changes were made today:  Added imdur  Labs/ tests ordered today include:  Orders Placed This Encounter  Procedures  . Basic metabolic panel  . CBC  . TSH  . Magnesium  . EKG 12-Lead    Disposition:   Follow up with Dr. Curt Bears  6 months   Signed, Shirley Friar, PA-C  11/20/2020 11:18 AM  [facility] [address] [place] [address] [phone] (office) [phone] (fax)

## 2020-11-20 ENCOUNTER — Other Ambulatory Visit: Payer: Self-pay

## 2020-11-20 ENCOUNTER — Ambulatory Visit: Payer: PPO | Admitting: Student

## 2020-11-20 ENCOUNTER — Encounter: Payer: Self-pay | Admitting: Student

## 2020-11-20 VITALS — BP 110/60 | HR 52 | Ht 67.0 in | Wt 190.0 lb

## 2020-11-20 DIAGNOSIS — I5022 Chronic systolic (congestive) heart failure: Secondary | ICD-10-CM | POA: Diagnosis not present

## 2020-11-20 DIAGNOSIS — I2510 Atherosclerotic heart disease of native coronary artery without angina pectoris: Secondary | ICD-10-CM | POA: Diagnosis not present

## 2020-11-20 DIAGNOSIS — I472 Ventricular tachycardia, unspecified: Secondary | ICD-10-CM

## 2020-11-20 LAB — CBC
Hematocrit: 40.7 % (ref 37.5–51.0)
Hemoglobin: 13 g/dL (ref 13.0–17.7)
MCH: 29.5 pg (ref 26.6–33.0)
MCHC: 31.9 g/dL (ref 31.5–35.7)
MCV: 92 fL (ref 79–97)
Platelets: 333 10*3/uL (ref 150–450)
RBC: 4.41 x10E6/uL (ref 4.14–5.80)
RDW: 12.2 % (ref 11.6–15.4)
WBC: 10.8 10*3/uL (ref 3.4–10.8)

## 2020-11-20 LAB — CUP PACEART INCLINIC DEVICE CHECK
Battery Remaining Longevity: 124 mo
Battery Voltage: 2.97 V
Brady Statistic RV Percent Paced: 0.05 %
Date Time Interrogation Session: 20220331130841
HighPow Impedance: 66 Ohm
Implantable Lead Implant Date: 20201224
Implantable Lead Location: 753860
Implantable Pulse Generator Implant Date: 20201224
Lead Channel Impedance Value: 304 Ohm
Lead Channel Impedance Value: 342 Ohm
Lead Channel Pacing Threshold Amplitude: 0.75 V
Lead Channel Pacing Threshold Pulse Width: 0.4 ms
Lead Channel Sensing Intrinsic Amplitude: 7.875 mV
Lead Channel Sensing Intrinsic Amplitude: 8.5 mV
Lead Channel Setting Pacing Amplitude: 2 V
Lead Channel Setting Pacing Pulse Width: 0.4 ms
Lead Channel Setting Sensing Sensitivity: 0.3 mV

## 2020-11-20 LAB — BASIC METABOLIC PANEL
BUN/Creatinine Ratio: 21 (ref 10–24)
BUN: 27 mg/dL (ref 8–27)
CO2: 22 mmol/L (ref 20–29)
Calcium: 9.5 mg/dL (ref 8.6–10.2)
Chloride: 104 mmol/L (ref 96–106)
Creatinine, Ser: 1.26 mg/dL (ref 0.76–1.27)
Glucose: 99 mg/dL (ref 65–99)
Potassium: 4.6 mmol/L (ref 3.5–5.2)
Sodium: 142 mmol/L (ref 134–144)
eGFR: 61 mL/min/{1.73_m2} (ref >59)

## 2020-11-20 LAB — MAGNESIUM: Magnesium: 2.3 mg/dL (ref 1.6–2.3)

## 2020-11-20 LAB — TSH: TSH: 1.87 u[IU]/mL (ref 0.450–4.500)

## 2020-11-20 MED ORDER — ISOSORBIDE MONONITRATE ER 30 MG PO TB24: 15.0000 mg | ORAL_TABLET | Freq: Every day | ORAL | 3 refills | Status: DC

## 2020-11-20 NOTE — Patient Instructions (Signed)
Medication Instructions:  Your physician has recommended you make the following change in your medication:   START: Isosorbide 15mg  daily  *If you need a refill on your cardiac medications before your next appointment, please call your pharmacy*   Lab Work: TODAY: BMET, CBC, MAG, TSH  If you have labs (blood work) drawn today and your tests are completely normal, you will receive your results only by: Marland Kitchen MyChart Message (if you have MyChart) OR . A paper copy in the mail If you have any lab test that is abnormal or we need to change your treatment, we will call you to review the results.  Follow-Up: At [facility], you and your health needs are our priority.  As part of our continuing mission to provide you with exceptional heart care, we have created designated Provider Care Teams.  These Care Teams include your primary Cardiologist (physician) and Advanced Practice Providers (APPs -  Physician Assistants and Nurse Practitioners) who all work together to provide you with the care you need, when you need it.    Your next appointment:   6 month(s)  The format for your next appointment:   In Person  Provider:   You may see Will Meredith Leeds, MD or one of the following Advanced Practice Providers on your designated Care Team:    Chanetta Marshall, NP  Tommye Standard, PA-C  Legrand Como "[place]" [place], [place]

## 2020-11-24 ENCOUNTER — Ambulatory Visit: Payer: Self-pay | Admitting: Registered Nurse

## 2020-11-25 ENCOUNTER — Ambulatory Visit (INDEPENDENT_AMBULATORY_CARE_PROVIDER_SITE_OTHER): Payer: PPO | Admitting: Registered Nurse

## 2020-11-25 ENCOUNTER — Encounter: Payer: Self-pay | Admitting: Registered Nurse

## 2020-11-25 ENCOUNTER — Other Ambulatory Visit: Payer: Self-pay

## 2020-11-25 VITALS — BP 126/74 | HR 54 | Temp 98.2°F | Resp 17 | Ht 67.0 in | Wt 191.4 lb

## 2020-11-25 DIAGNOSIS — E1165 Type 2 diabetes mellitus with hyperglycemia: Secondary | ICD-10-CM

## 2020-11-25 DIAGNOSIS — H6063 Unspecified chronic otitis externa, bilateral: Secondary | ICD-10-CM | POA: Diagnosis not present

## 2020-11-25 DIAGNOSIS — Z13220 Encounter for screening for lipoid disorders: Secondary | ICD-10-CM | POA: Diagnosis not present

## 2020-11-25 DIAGNOSIS — I252 Old myocardial infarction: Secondary | ICD-10-CM

## 2020-11-25 LAB — COMPREHENSIVE METABOLIC PANEL
ALT: 24 U/L (ref 0–53)
AST: 23 U/L (ref 0–37)
Albumin: 4.3 g/dL (ref 3.5–5.2)
Alkaline Phosphatase: 79 U/L (ref 39–117)
BUN: 24 mg/dL — ABNORMAL HIGH (ref 6–23)
CO2: 25 mEq/L (ref 19–32)
Calcium: 9.4 mg/dL (ref 8.4–10.5)
Chloride: 108 mEq/L (ref 96–112)
Creatinine, Ser: 1.3 mg/dL (ref 0.40–1.50)
GFR: 55.7 mL/min — ABNORMAL LOW (ref >60.00)
Glucose, Bld: 112 mg/dL — ABNORMAL HIGH (ref 70–99)
Potassium: 3.9 mEq/L (ref 3.5–5.1)
Sodium: 140 mEq/L (ref 135–145)
Total Bilirubin: 0.6 mg/dL (ref 0.2–1.2)
Total Protein: 7.4 g/dL (ref 6.0–8.3)

## 2020-11-25 LAB — CBC
HCT: 42 % (ref 39.0–52.0)
Hemoglobin: 14.1 g/dL (ref 13.0–17.0)
MCHC: 33.6 g/dL (ref 30.0–36.0)
MCV: 95 fl (ref 78.0–100.0)
Platelets: 340 10*3/uL (ref 150.0–400.0)
RBC: 4.42 Mil/uL (ref 4.22–5.81)
RDW: 13.6 % (ref 11.5–15.5)
WBC: 11.9 10*3/uL — ABNORMAL HIGH (ref 4.0–10.5)

## 2020-11-25 LAB — LIPID PANEL
Cholesterol: 111 mg/dL (ref 0–200)
HDL: 39.7 mg/dL (ref >39.00)
LDL Cholesterol: 42 mg/dL (ref 0–99)
NonHDL: 71.74
Total CHOL/HDL Ratio: 3
Triglycerides: 149 mg/dL (ref 0.0–149.0)
VLDL: 29.8 mg/dL (ref 0.0–40.0)

## 2020-11-25 LAB — HEMOGLOBIN A1C: Hgb A1c MFr Bld: 6.5 % (ref 4.6–6.5)

## 2020-11-25 MED ORDER — HYDROCORTISONE-ACETIC ACID 1-2 % OT SOLN: 3.0000 [drp] | Freq: Three times a day (TID) | OTIC | 0 refills | Status: DC

## 2020-11-25 NOTE — Progress Notes (Signed)
Established Patient Office Visit  Subjective:  Patient ID: Lawrence Olson, male    DOB: [birth year]  Age: 70 y.o. [name] [id number]  CC:  Chief Complaint  Patient presents with  . Diabetes    Pt doing good no concerns today, this mornings sugar 114    HPI Lawrence Olson presents for 6 mo follow up on chronic conditions  Overall feeling very well. Has maintained diet and exercise control of conditions with help of carvedilol 6.28m PO bid, imdur 352mER po qd, entresto 97-10335mO qd, and atorvastatin 30m58m qd  No new symptoms or concerns regarding chronic conditions  Does report tinnitus. Ongoing. Bilateral. Hx of working in loud environment. Has had some waxy discharge from ears. Needs TV too loud, endorsed by his wife who is here today. Not interested in hearing tests.  Past Medical History:  Diagnosis Date  . Acute blood loss anemia   . Acute on chronic combined systolic and diastolic congestive heart failure (HCC)[place]. Acute respiratory failure (HCC)[place]. Acute ST elevation myocardial infarction (STEMI) involving left anterior descending (LAD) coronary artery (HCC)[place]24/2020  . AKI (acute kidney injury) (HCC)[place]. Anoxic brain injury (HCC)[place]. Cardiac arrest (HCC)[place]24/2020  . Cardiogenic shock (HCC)[place]24/2020  . Controlled type 2 diabetes mellitus with hyperglycemia (HCC)[place]. Coronary artery disease involving native coronary artery of native heart without angina pectoris   . Jaundice    [place]   . Myocardial infarction (HCC)[place]. Thrombocytosis   . VT (ventricular tachycardia) (HCC)[place]  Past Surgical History:  Procedure Laterality Date  . ICD IMPLANT N/A 08/16/2019   Procedure: ICD IMPLANT;  Surgeon: CamnConstance Haw;  [facility];  Service: Cardiovascular;  Laterality: N/A;  . RIGHT HEART CATH AND CORONARY ANGIOGRAPHY N/A 04/16/2019   Procedure: RIGHT HEART CATH AND CORONARY ANGIOGRAPHY;  Surgeon: KellTroy Sine;  [facility];  Service: Cardiovascular;  Laterality: N/A;  . VENTRICULAR ASSIST DEVICE INSERTION N/A 04/16/2019   Procedure: VENTRICULAR ASSIST DEVICE INSERTION;  Surgeon: KellTroy Sine;  [facility];  Service: Cardiovascular;  Laterality: N/A;    Family History  Problem Relation Age of Onset  . Healthy Brother   . Healthy Sister     Social History   Socioeconomic History  . Marital status: Married    Spouse name: Not on file  . Number of children: Not on file  . Years of education: Not on file  . Highest education level: Not on file  Occupational History  . Not on file  Tobacco Use  . Smoking status: Former [employer] scientist (life sciences)Smokeless tobacco: Never Used  Vaping Use  . Vaping Use: Never used  Substance and Sexual Activity  . Alcohol use: Yes    Comment: rarely  . Drug use: Never  . Sexual activity: Not Currently  Other Topics Concern  . Not on file  Social History Narrative  . Not on file   Social Determinants of Health   Financial Resource Strain: Not on file  Food Insecurity: Not on file  Transportation Needs: Not on file  Physical Activity: Not on file  Stress: Not on file  Social Connections: Not on file  Intimate Partner Violence: Not on file    Outpatient Medications Prior to Visit  Medication Sig Dispense Refill  . aspirin 81  MG chewable tablet Chew 1 tablet (81 mg total) by mouth daily.    Marland Kitchen atorvastatin (LIPITOR) 80 MG tablet TAKE 1 TABLET(80 MG) BY MOUTH DAILY AT 6 PM 30 tablet 6  . B Complex-C (B-COMPLEX WITH VITAMIN C) tablet Take 1 tablet by mouth daily.    . blood glucose meter kit and supplies KIT Dispense based on patient and insurance preference. Use up to four times daily as directed. (FOR ICD-9 250.00, 250.01). 1 each 0  . carvedilol (COREG) 6.25 MG tablet Take 1 tablet (6.25 mg total) by mouth 2 (two) times daily with a meal. 180 tablet 3  . empagliflozin ([name]) 10 MG TABS tablet Take 10 mg by  mouth daily before breakfast. 90 tablet 3  . isosorbide mononitrate (IMDUR) 30 MG 24 hr tablet Take 0.5 tablets (15 mg total) by mouth daily. 45 tablet 3  . Lancet Device MISC 1 Units by Does not apply route daily before breakfast. 90 each 0  . Lancets (ONETOUCH DELICA PLUS [id number]) MISC USE AS DIRECTED DAILY BEFORE BREAKFAST 100 each 3  . ONETOUCH ULTRA test strip USE TO CHECK BLOOD GLUCOSE UP TO FOUR TIMES DAILY AS DIRECTED 300 strip 1  . sacubitril-valsartan (ENTRESTO) 97-103 MG Take 1 tablet by mouth 2 (two) times daily. 60 tablet 6  . tamsulosin (FLOMAX) 0.4 MG CAPS capsule TAKE 1 CAPSULE(0.4 MG) BY MOUTH DAILY AFTER SUPPER 90 capsule 0   No facility-administered medications prior to visit.    No Known Allergies  ROS Review of Systems  Constitutional: Negative.   HENT: Negative.   Eyes: Negative.   Respiratory: Negative.   Cardiovascular: Negative.   Gastrointestinal: Negative.   Genitourinary: Negative.   Musculoskeletal: Negative.   Skin: Negative.   Neurological: Negative.   Psychiatric/Behavioral: Negative.   All other systems reviewed and are negative.     Objective:    Physical Exam Constitutional:      General: He is not in acute distress.    Appearance: Normal appearance. He is normal weight. He is not ill-appearing, toxic-appearing or diaphoretic.  HENT:     Head: Normocephalic and atraumatic.     Right Ear: Tympanic membrane, ear canal and external ear normal.     Left Ear: Tympanic membrane, ear canal and external ear normal.  Cardiovascular:     Rate and Rhythm: Normal rate and regular rhythm.     Heart sounds: Normal heart sounds. No murmur heard. No friction rub. No gallop.   Pulmonary:     Effort: Pulmonary effort is normal. No respiratory distress.     Breath sounds: Normal breath sounds. No stridor. No wheezing, rhonchi or rales.  Chest:     Chest wall: No tenderness.  Neurological:     General: No focal deficit present.     Mental Status: He  is alert and oriented to person, place, and time. Mental status is at baseline.  Psychiatric:        Mood and Affect: Mood normal.        Behavior: Behavior normal.        Thought Content: Thought content normal.        Judgment: Judgment normal.     BP 126/74   Pulse (!) 54   Temp 98.2 F (36.8 C) (Temporal)   Resp 17   Ht '5\' 7"'  (1.702 m)   Wt 191 lb 6.4 oz (86.8 kg)   SpO2 97%   BMI 29.98 kg/m  Wt Readings from Last 3 Encounters:  11/25/20  191 lb 6.4 oz (86.8 kg)  11/20/20 190 lb (86.2 kg)  10/23/20 191 lb 6.4 oz (86.8 kg)     Health Maintenance Due  Topic Date Due  . OPHTHALMOLOGY EXAM  Never done    There are no preventive care reminders to display for this patient.  Lab Results  Component Value Date   TSH 1.870 11/20/2020   Lab Results  Component Value Date   WBC 11.9 (H) 11/25/2020   HGB 14.1 11/25/2020   HCT 42.0 11/25/2020   MCV 95.0 11/25/2020   PLT 340.0 11/25/2020   Lab Results  Component Value Date   NA 140 11/25/2020   K 3.9 11/25/2020   CO2 25 11/25/2020   GLUCOSE 112 (H) 11/25/2020   BUN 24 (H) 11/25/2020   CREATININE 1.30 11/25/2020   BILITOT 0.6 11/25/2020   ALKPHOS 79 11/25/2020   AST 23 11/25/2020   ALT 24 11/25/2020   PROT 7.4 11/25/2020   ALBUMIN 4.3 11/25/2020   CALCIUM 9.4 11/25/2020   ANIONGAP 8 10/23/2020   GFR 55.70 (L) 11/25/2020   Lab Results  Component Value Date   CHOL 111 11/25/2020   Lab Results  Component Value Date   HDL 39.70 11/25/2020   Lab Results  Component Value Date   LDLCALC 42 11/25/2020   Lab Results  Component Value Date   TRIG 149.0 11/25/2020   Lab Results  Component Value Date   CHOLHDL 3 11/25/2020   Lab Results  Component Value Date   HGBA1C 6.5 11/25/2020      Assessment & Plan:   Problem List Items Addressed This Visit      Endocrine   Controlled type 2 diabetes mellitus with hyperglycemia, without long-term current use of insulin (HCC) - Primary   Relevant Orders    Hemoglobin A1c (Completed)   CBC (Completed)   Comprehensive metabolic panel (Completed)   Lipid panel (Completed)     Other   History of ST elevation myocardial infarction (STEMI)   Relevant Orders   Hemoglobin A1c (Completed)   CBC (Completed)   Comprehensive metabolic panel (Completed)   Lipid panel (Completed)    Other Visit Diagnoses    Lipid screening       Relevant Orders   Lipid panel (Completed)   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 acetic acid-hydrocortisone (VOSOL-HC) OTIC solution    Sig: Place 3 drops into both ears 3 (three) times daily.    Dispense:  10 mL    Refill:  0    Order Specific Question:   Supervising Provider    Answer:   Carlota Raspberry, JEFFREY R [[id number]]    Follow-up: No follow-ups on file.   PLAN  No apparent concerns on ear exam. Suspect tinnitus related to loud noise exposure. Pt to return if he desires hearing test - can refer to [facility]. Will give vosol for drainage, suspect some allergic otitis externa. Return if worsening or failing to improve  Pt seems to be doing very well overall. Labs drawn. Continue current regimen. Return in 6 mo  Patient encouraged to call clinic with any questions, comments, or concerns.   [name], NP

## 2020-11-26 NOTE — Progress Notes (Signed)
Remote ICD transmission.   

## 2020-12-28 IMAGING — DX PORTABLE CHEST - 1 VIEW
1 series · 1 of 1 positions shown · non-contrast
Comparison: Yesterday

CLINICAL DATA: Cardiac arrest with CPR

EXAM:
PORTABLE CHEST 1 VIEW

[chest]
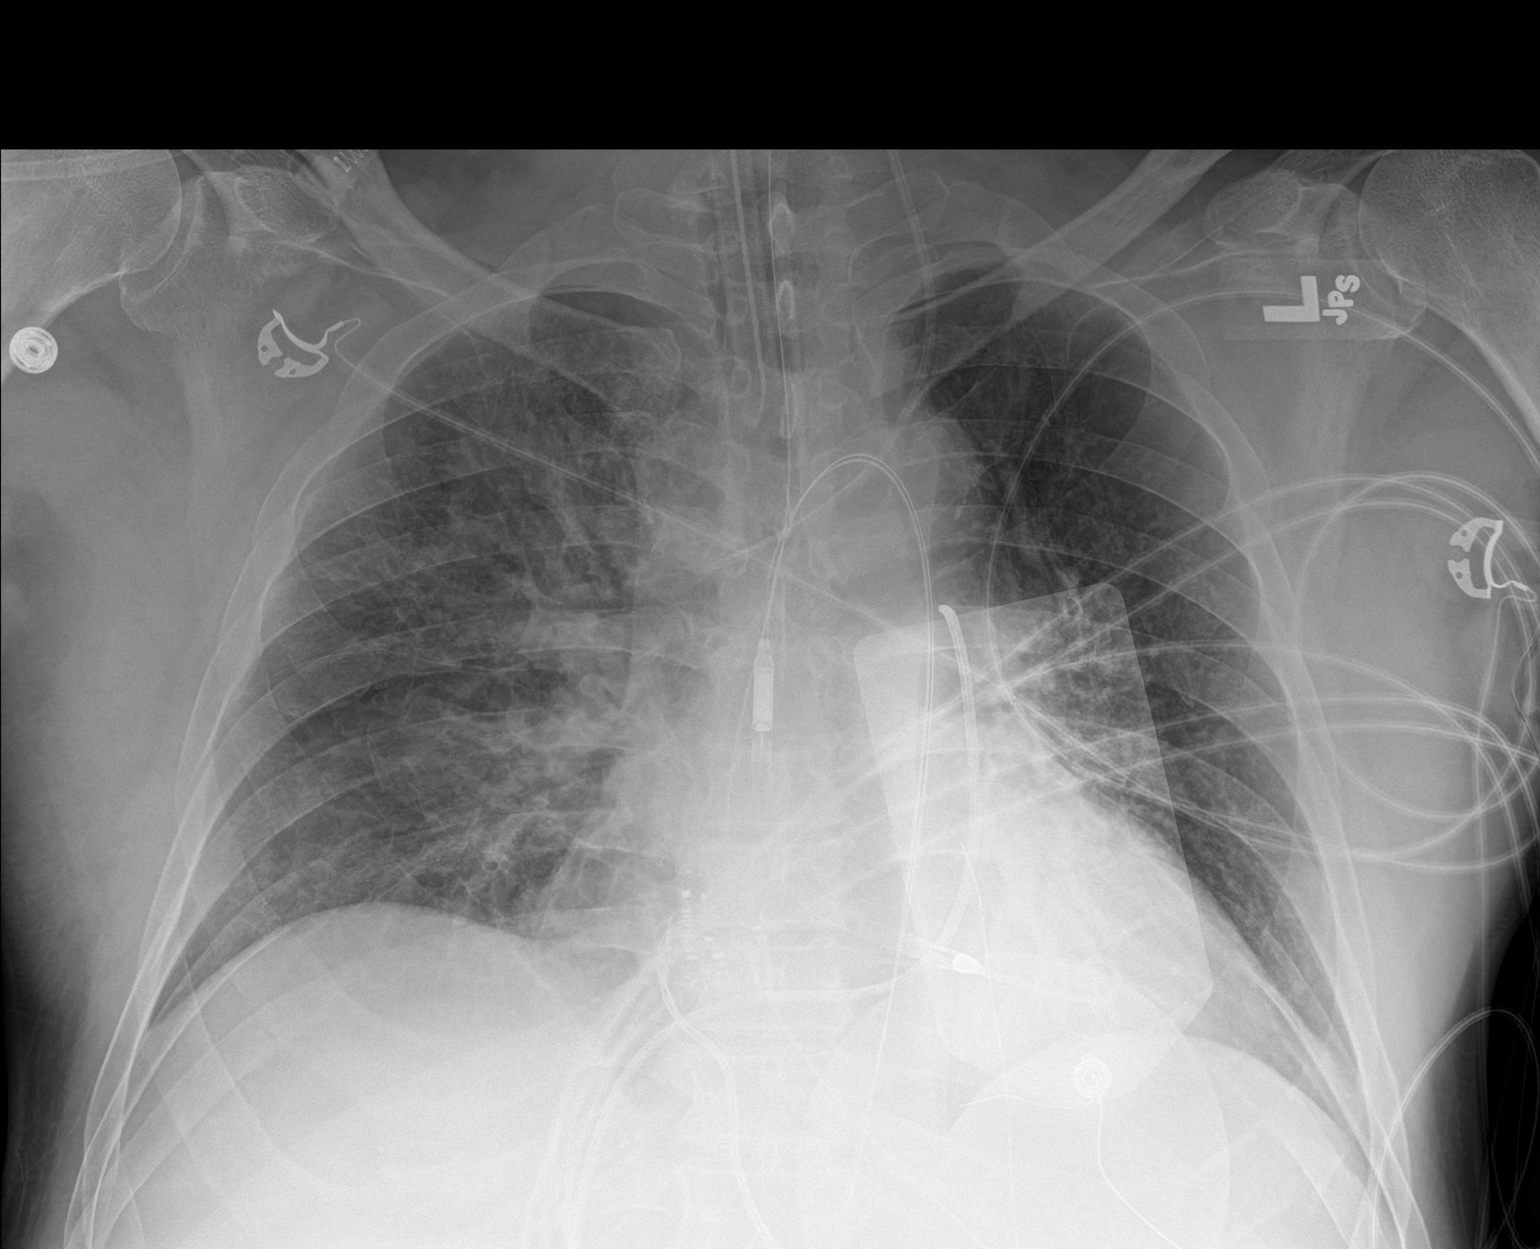

[1 of 1 positions shown; findings below may reference images not displayed]

FINDINGS: Endotracheal tube tip at the clavicular heads. Assist device with
loop at the level of the left ventricle. Swan-Ganz catheter from
below with tip at the main pulmonary artery. Left IJ line with tip
at the brachiocephalic SVC confluence. Perihilar hazy opacity. No
Kerley lines, effusion, or pneumothorax. Normal heart size.
IMPRESSION: 1. Unremarkable hardware positioning as described.
2. Stable low volume chest with atelectasis and vascular
congestion/perihilar edema.

## 2020-12-30 IMAGING — DX PORTABLE CHEST - 1 VIEW
1 series · 1 of 1 positions shown · non-contrast
Comparison: 04/18/2019

CLINICAL DATA: Code STEMI, post arrest

EXAM:
PORTABLE CHEST 1 VIEW

[chest ap]
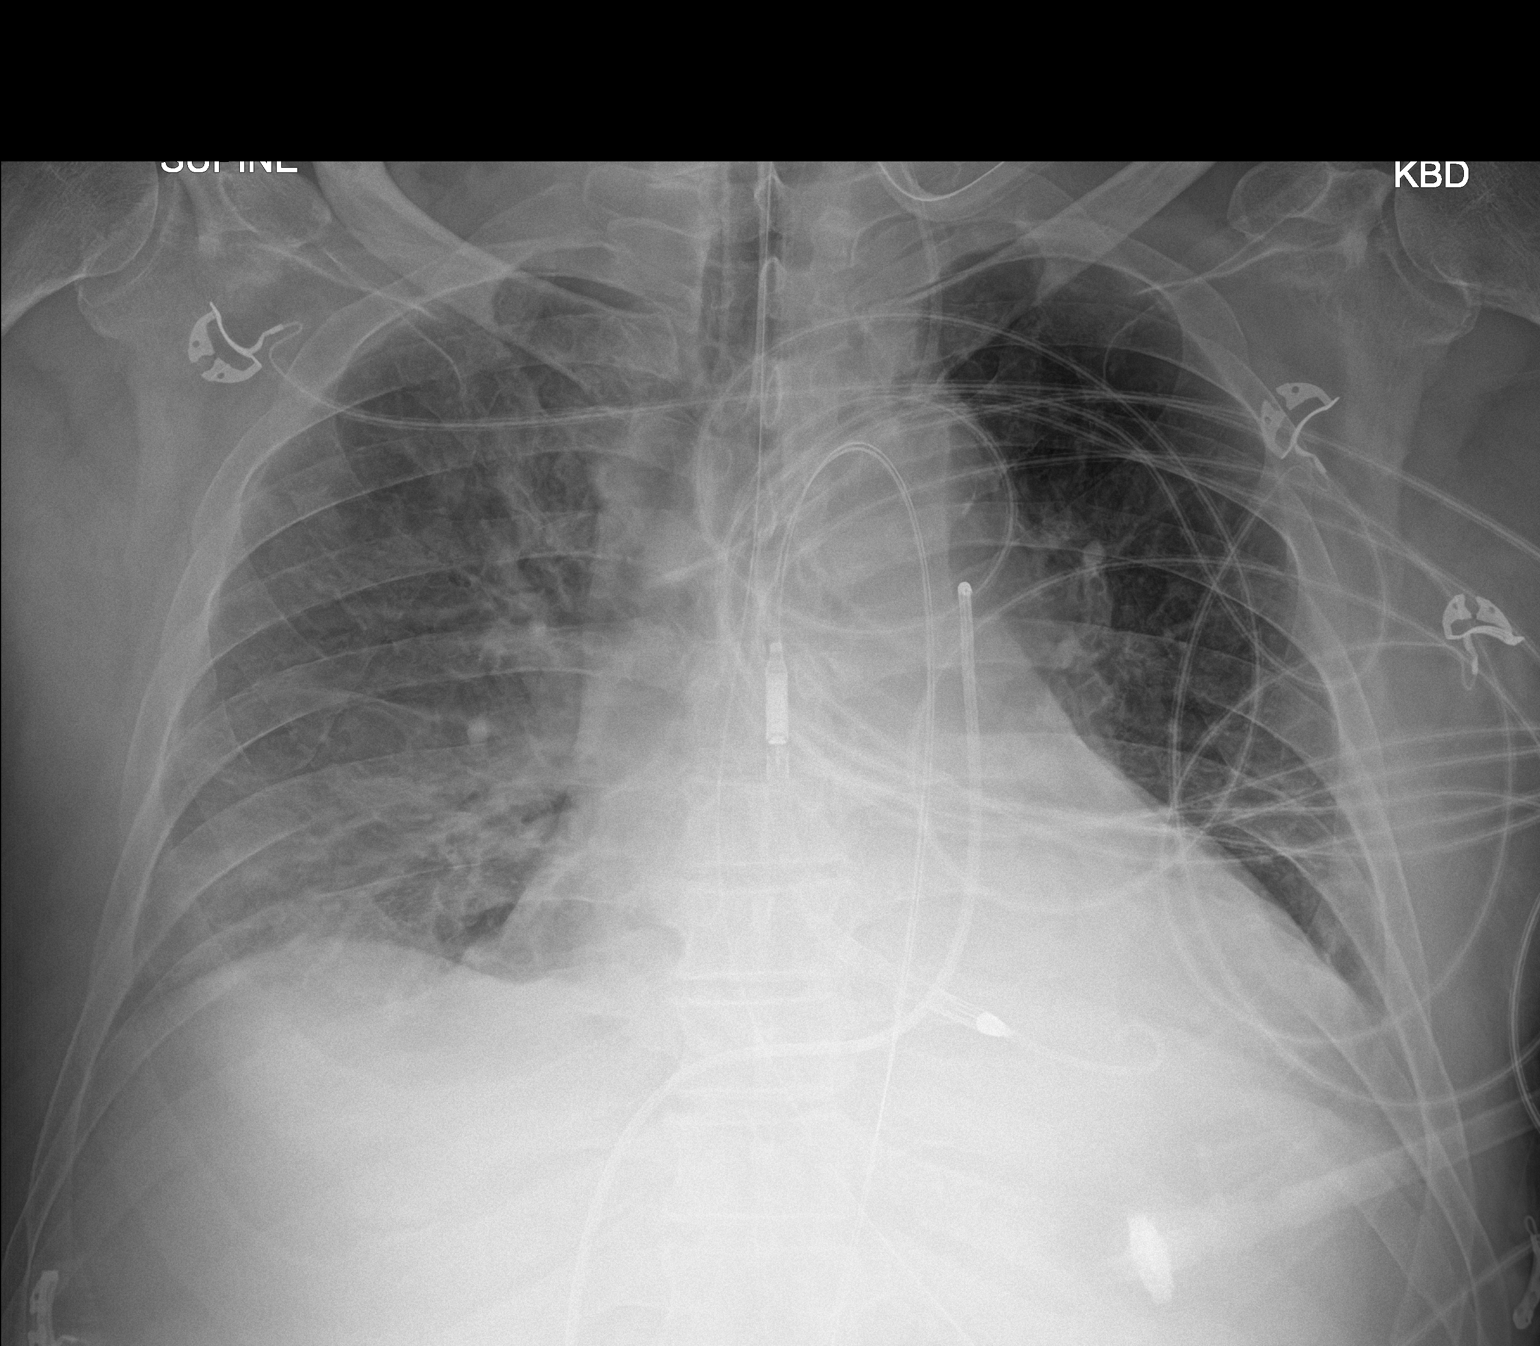

[1 of 1 positions shown; findings below may reference images not displayed]

FINDINGS: No significant change in AP portable radiograph with support
apparatus including endotracheal tube, Impella device, and IVC
approach left pulmonary artery catheter. Cardiomegaly with mild,
diffuse interstitial pulmonary opacity. No new or focal airspace
opacity.
IMPRESSION: No significant change in AP portable radiograph with support
apparatus including endotracheal tube, Impella device, and IVC
approach left pulmonary artery catheter. Cardiomegaly with mild,
diffuse interstitial pulmonary opacity. No new or focal airspace
opacity.

## 2021-01-02 IMAGING — DX CHEST  1 VIEW
1 series · 1 of 1 positions shown · non-contrast
Comparison: 04/22/2019 and prior radiographs

CLINICAL DATA: Respiratory distress.

EXAM:
CHEST  1 VIEW

[chest ap]
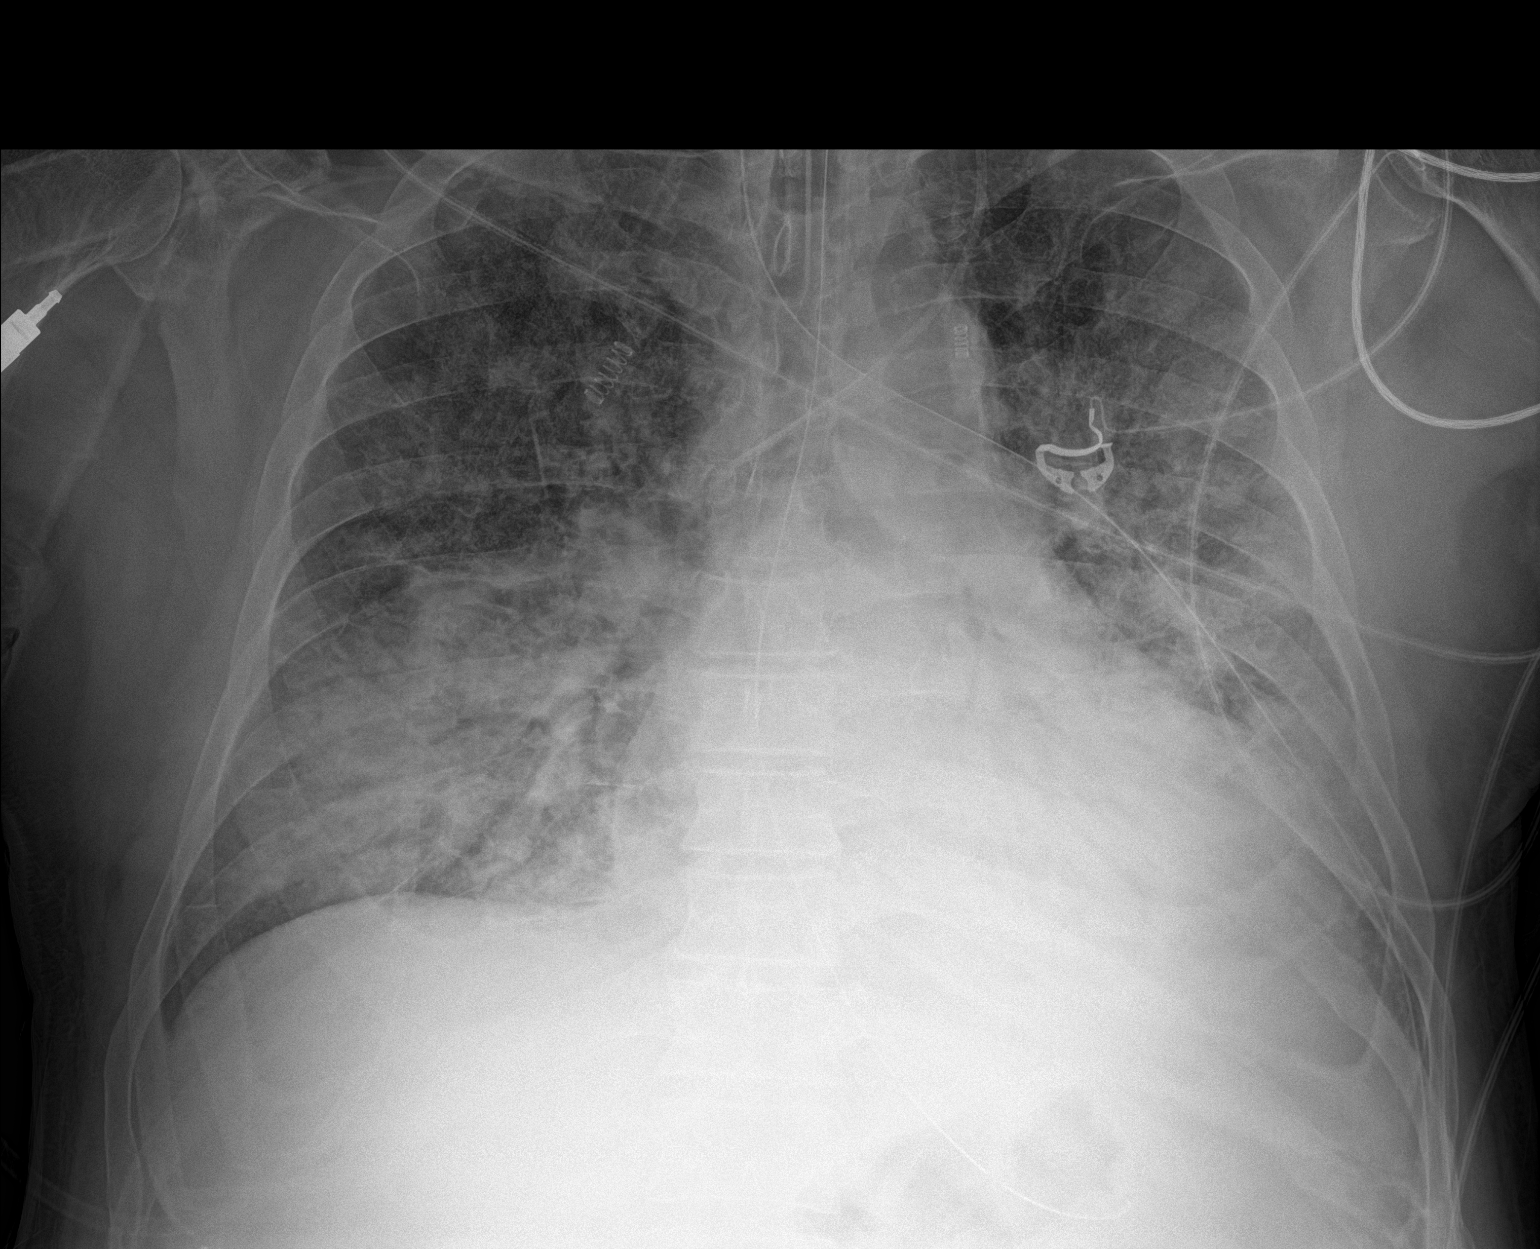

[1 of 1 positions shown; findings below may reference images not displayed]

FINDINGS: Endotracheal tube with tip 5.5 cm above the carina and NG tube with
tip overlying the proximal stomach again noted.

A LEFT IJ central venous catheter with tip overlying the UPPER SVC
noted.

Diffuse bilateral airspace opacities have slightly increased since
film earlier today.

There is no evidence of pneumothorax.  No other interval change.
IMPRESSION: 1. Slightly increased bilateral airspace opacities since film
earlier today. No other significant change.

## 2021-01-03 IMAGING — MR MR HEAD W/O CM
9 of 11 series · 36 of 48 positions shown · non-contrast
Comparison: Head CT 04/21/2019

CLINICAL DATA: Anoxic brain injury.

EXAM:
MRI HEAD WITHOUT CONTRAST
TECHNIQUE: Multiplanar, multiecho pulse sequences of the brain and surrounding
structures were obtained without intravenous contrast.

[Series 2: DWI · axial · 3.0mm · 0.94mm/px · z∈[-115,+42]mm · 7 of 108 slices shown (1 of 2)]
[im 1/108]
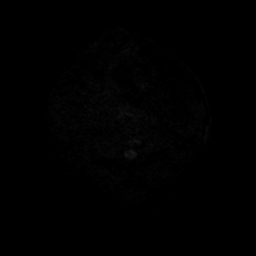
[im 18/108]
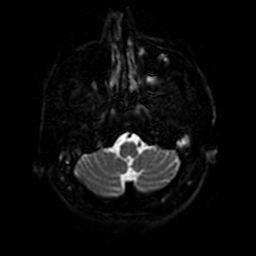
[im 36/108]
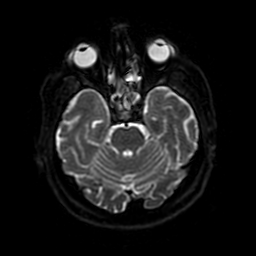
[im 54/108]
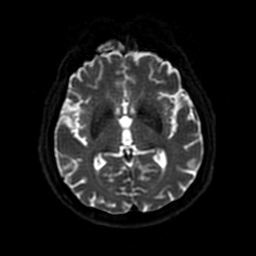
[im 72/108]
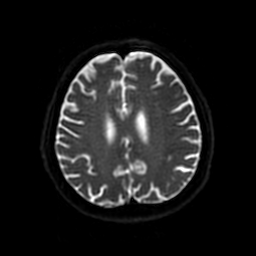
[im 90/108]
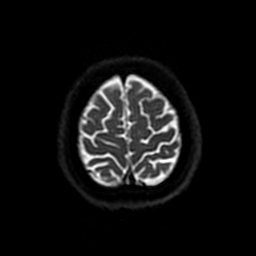
[im 108/108]
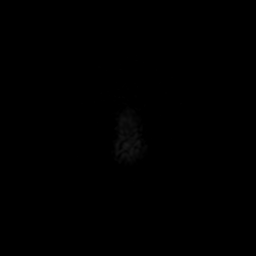

[Series 3: DWI · coronal · 4.0mm · 0.94mm/px · 6 of 76 slices shown (2 of 2)]
[im 1/76]
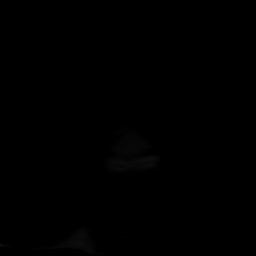
[im 16/76]
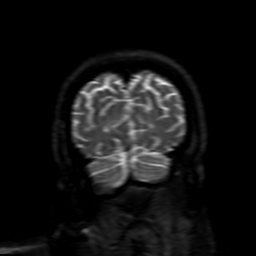
[im 31/76]
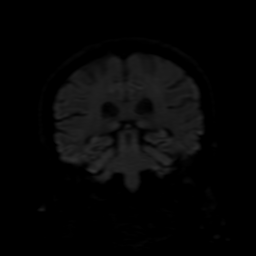
[im 46/76]
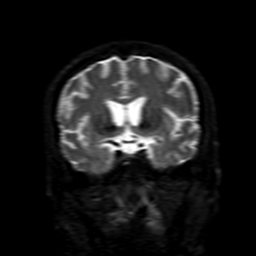
[im 61/76]
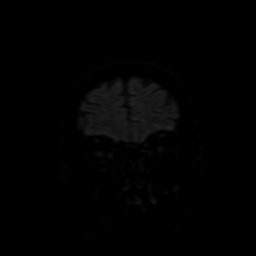
[im 76/76]
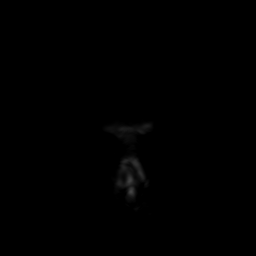

[Series 4: SWI · axial · 3.0mm · 0.47mm/px · z∈[-119,+39]mm · 8 of 108 slices shown]
[im 1/108]
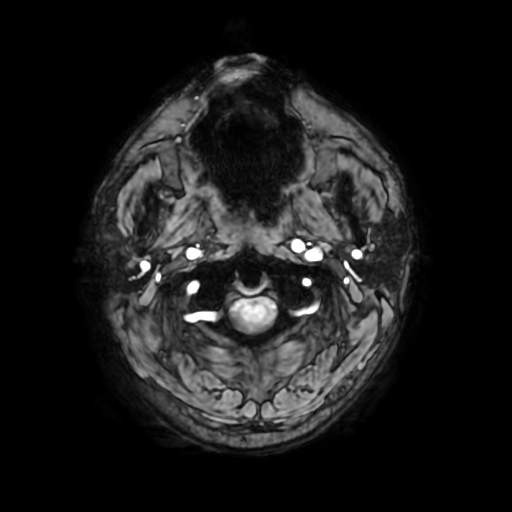
[im 16/108]
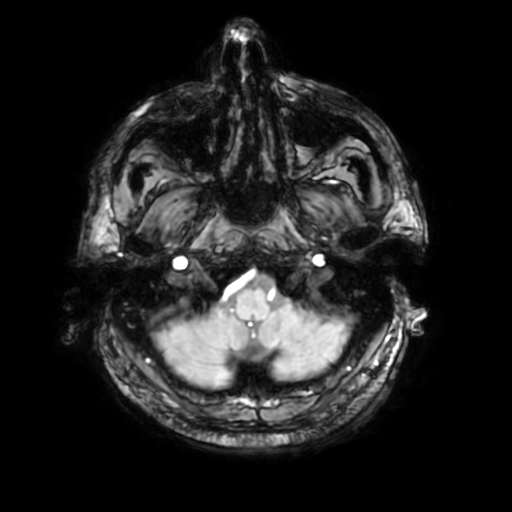
[im 31/108]
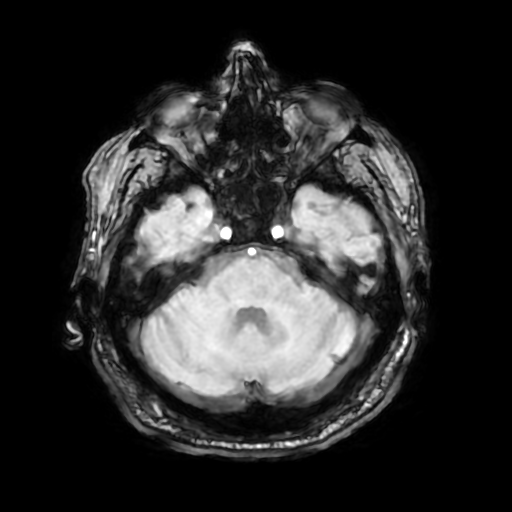
[im 46/108]
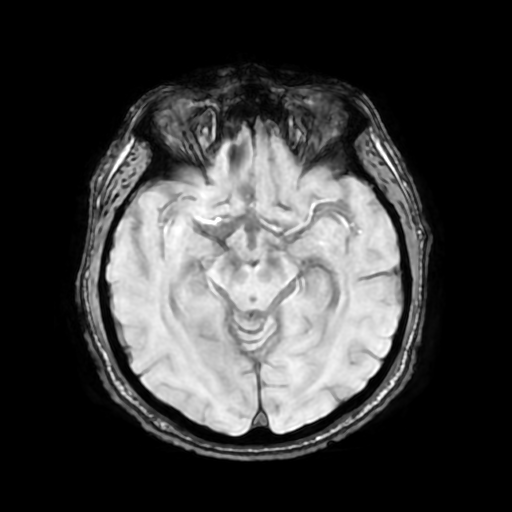
[im 62/108]
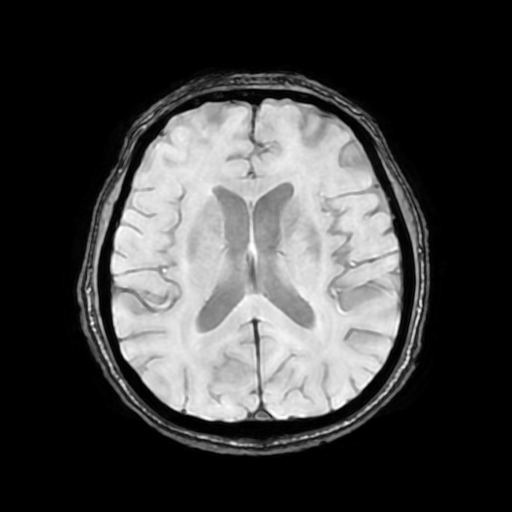
[im 77/108]
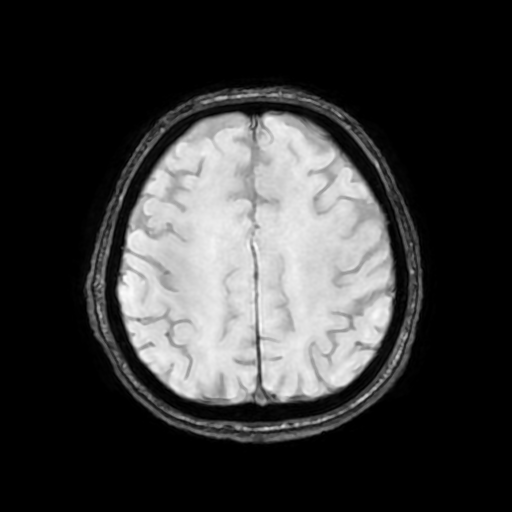
[im 92/108]
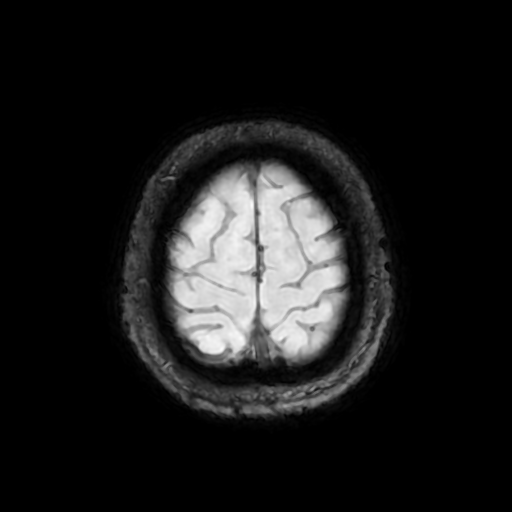
[im 108/108]
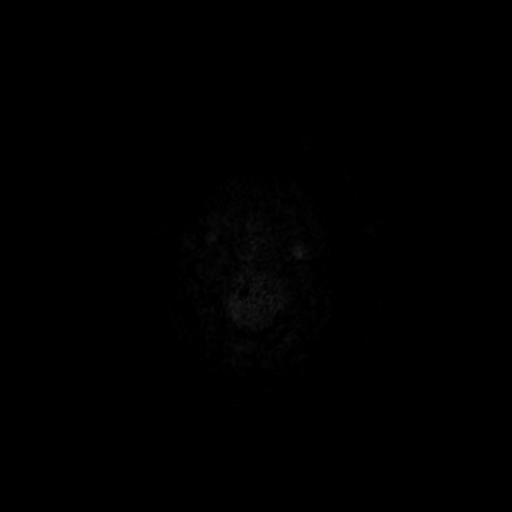

[Series 5: FLAIR · axial · 3.0mm · 0.94mm/px · z∈[-114,+41]mm · 2 of 27 slices shown (1 of 2)]
[im 1/27]
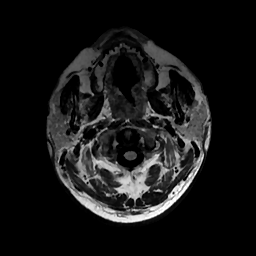
[im 27/27]
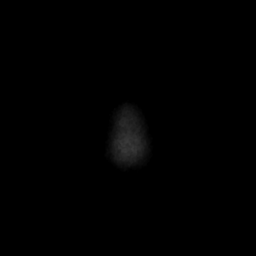

[Series 6: FLAIR · sagittal · 5.0mm · 0.94mm/px · 2 of 28 slices shown (2 of 2)]
[im 1/28]
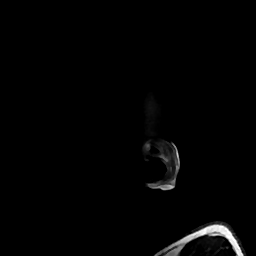
[im 28/28]
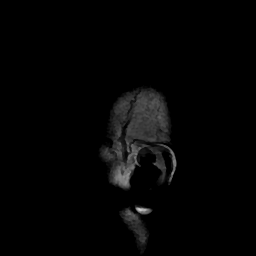

[Series 7: T2 · axial · 5.0mm · 0.47mm/px · z∈[-114,+41]mm · 2 of 27 slices shown (1 of 2)]
[im 1/27]
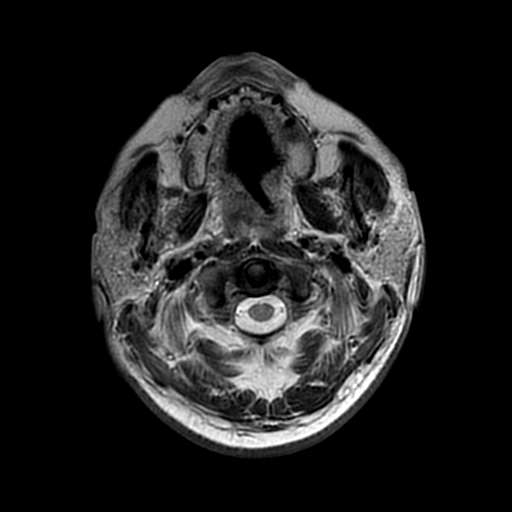
[im 27/27]
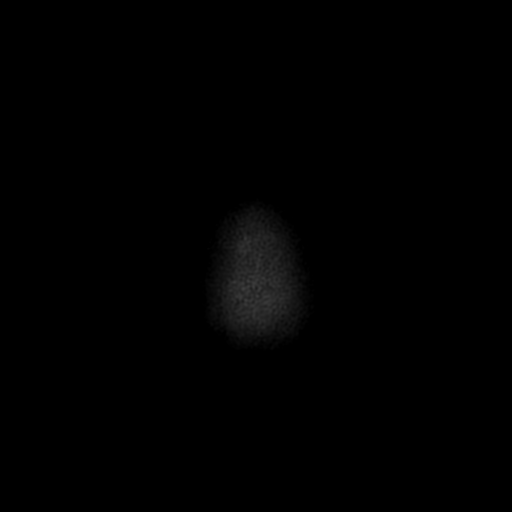

[Series 9: T2 · coronal · 5.0mm · 0.47mm/px · 2 of 32 slices shown (2 of 2)]
[im 1/32]
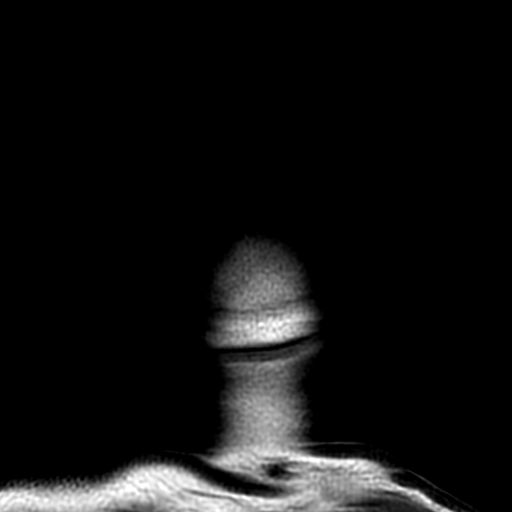
[im 32/32]
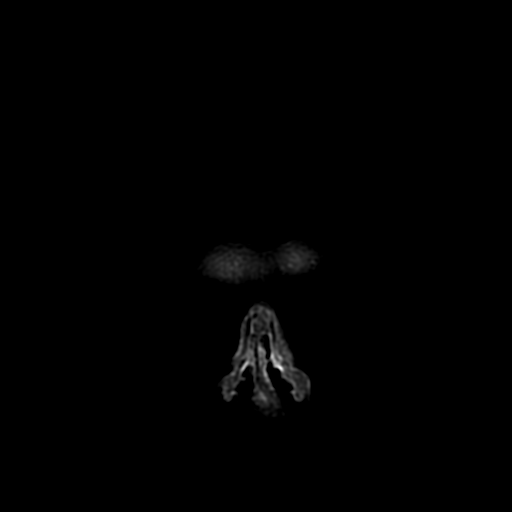

[Series 250: ADC · axial · 3.0mm · 0.94mm/px · z∈[-115,+42]mm · 4 of 50 slices shown (1 of 2)]
[im 1/50]
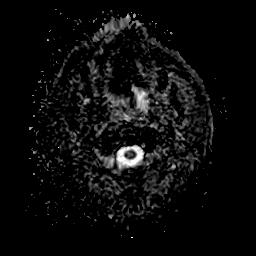
[im 17/50]
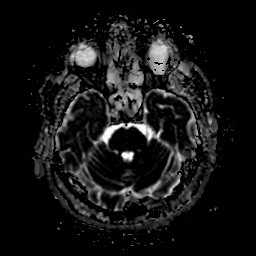
[im 33/50]
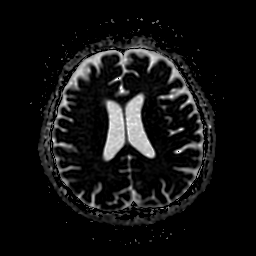
[im 50/50]
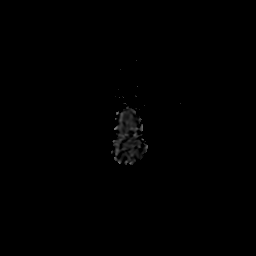

[Series 350: ADC · coronal · 4.0mm · 0.94mm/px · 3 of 38 slices shown (2 of 2)]
[im 1/38]
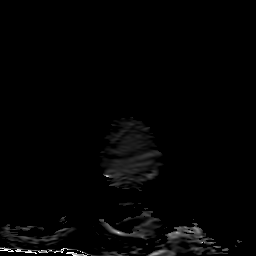
[im 19/38]
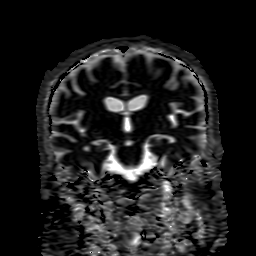
[im 38/38]
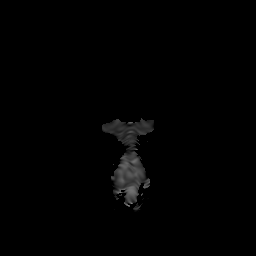

[36 of 48 positions shown; findings below may reference images not displayed]

FINDINGS: Brain: Diffusion imaging does not show evidence of universal anoxic
brain injury. There is punctate acute infarction in the right
caudate head. No evidence of universal deep brain insult. No
evidence of universal cortical insult. One could question mild
cortical ischemic changes in the frontal regions and insula. No
evidence of hemorrhage, swelling or shift. No extra-axial
collection.

Vascular: Major vessels at the base of the brain show flow.

Skull and upper cervical spine: Negative

Sinuses/Orbits: Clear/normal

Other: None
IMPRESSION: Punctate acute infarction in the right caudate head.

One could question hypoxic ischemic cortical insult in the frontal
regions and insular regions, but the findings are not definite.
There is not a widespread pattern of definite whole brain hypoxic
ischemic insult.

## 2021-01-12 ENCOUNTER — Other Ambulatory Visit (HOSPITAL_COMMUNITY): Payer: Self-pay | Admitting: Internal Medicine

## 2021-01-16 ENCOUNTER — Telehealth: Payer: Self-pay | Admitting: Registered Nurse

## 2021-01-16 NOTE — Telephone Encounter (Signed)
Spoke with patent's daughter she stated they will call back next week to sched AWV

## 2021-01-28 ENCOUNTER — Other Ambulatory Visit: Payer: Self-pay | Admitting: Registered Nurse

## 2021-02-09 ENCOUNTER — Ambulatory Visit (INDEPENDENT_AMBULATORY_CARE_PROVIDER_SITE_OTHER): Payer: PPO | Admitting: *Deleted

## 2021-02-09 DIAGNOSIS — Z0000 Encounter for general adult medical examination without abnormal findings: Secondary | ICD-10-CM

## 2021-02-09 NOTE — Progress Notes (Addendum)
Subjective:   Lawrence Olson is a 70 y.o. male who presents for Medicare Annual/Subsequent preventive examination.  I connected with  Gable Rudzinski on 02/09/21 by a telephone enabled telemedicine application and verified that I am speaking with the correct person using two identifiers.  Patient daughter [name] translated for this visit   I discussed the limitations of evaluation and management by telemedicine. The patient expressed understanding and agreed to proceed.  Patient location: home  Provider location:TeleHealth visit    Review of Systems     NAA Cardiac Risk Factors include: advanced age (>58mn, >>23women);diabetes mellitus;dyslipidemia;male gender;family history of premature cardiovascular disease     Objective:    Today's Vitals   There is no height or weight on file to calculate BMI.  Advanced Directives 02/09/2021 08/16/2019 05/05/2019 04/19/2019 04/17/2019  Does Patient Have a Medical Advance Directive? Yes No No - No  Type of [place] - - - -  Copy of [place]? No - copy requested - - - -  Would patient like information on creating a medical advance directive? No - Patient declined No - Patient declined No - Patient declined No - Patient declined -    Current Medications (verified) Outpatient Encounter Medications as of 02/09/2021  Medication Sig   acetic acid-hydrocortisone (VOSOL-HC) OTIC solution Place 3 drops into both ears 3 (three) times daily.   aspirin 81 MG chewable tablet Chew 1 tablet (81 mg total) by mouth daily.   atorvastatin (LIPITOR) 80 MG tablet TAKE 1 TABLET(80 MG) BY MOUTH DAILY AT 6 PM   B Complex-C (B-COMPLEX WITH VITAMIN C) tablet Take 1 tablet by mouth daily.   blood glucose meter kit and supplies KIT Dispense based on patient and insurance preference. Use up to four times daily as directed. (FOR ICD-9 250.00, 250.01).   carvedilol (COREG) 6.25 MG tablet  Take 1 tablet (6.25 mg total) by mouth 2 (two) times daily with a meal.   isosorbide mononitrate (IMDUR) 30 MG 24 hr tablet Take 0.5 tablets (15 mg total) by mouth daily.   [name] 10 MG TABS tablet TAKE 1 TABLET BY MOUTH DAILY BEFORE BREAKFAST   Lancet Device MISC 1 Units by Does not apply route daily before breakfast.   Lancets (ONETOUCH DELICA PLUS [id number] MISC USE AS DIRECTED DAILY BEFORE BREAKFAST   ONETOUCH ULTRA test strip USE TO CHECK BLOOD GLUCOSE UP TO FOUR TIMES DAILY AS DIRECTED   sacubitril-valsartan (ENTRESTO) 97-103 MG Take 1 tablet by mouth 2 (two) times daily.   tamsulosin (FLOMAX) 0.4 MG CAPS capsule TAKE 1 CAPSULE(0.4 MG) BY MOUTH DAILY AFTER SUPPER   No facility-administered encounter medications on file as of 02/09/2021.    Allergies (verified) Patient has no known allergies.   History: Past Medical History:  Diagnosis Date   Acute blood loss anemia    Acute on chronic combined systolic and diastolic congestive heart failure (HCC)    Acute respiratory failure (HCC)    Acute ST elevation myocardial infarction (STEMI) involving left anterior descending (LAD) coronary artery ([place] 04/16/2019   AKI (acute kidney injury) ([place]    Anoxic brain injury ([place]    Cardiac [place] 04/[place]   Cardiogenic shock ([place] 04/16/2019   Controlled type 2 diabetes mellitus with hyperglycemia ([facility]    Coronary artery disease involving native coronary artery of native heart without angina pectoris    [place]und[place]  Leukocytosis  Myocardial infarction ([place])    Thrombocytosis    VT (ventricular tachycardia) ([place])    Past Surgical History:  Procedure Laterality Date   ICD IMPLANT N/A 08/16/2019   Procedure: ICD IMPLANT;  Surgeon: Constance Haw, MD;  Location: [facility];  Service: Cardiovascular;  Laterality: N/A;   RIGHT HEART CATH AND CORONARY ANGIOGRAPHY N/A 04/16/2019   Procedure: RIGHT HEART CATH AND CORONARY ANGIOGRAPHY;   Surgeon: Troy Sine, MD;  Location: [facility];  Service: Cardiovascular;  Laterality: N/A;   VENTRICULAR ASSIST DEVICE INSERTION N/A 04/16/2019   Procedure: VENTRICULAR ASSIST DEVICE INSERTION;  Surgeon: Troy Sine, MD;  Location: [facility];  Service: Cardiovascular;  Laterality: N/A;   Family History  Problem Relation Age of Onset   Healthy Brother    Healthy Sister    Social History   Socioeconomic History   Marital status: Married    Spouse name: Not on file   Number of children: Not on file   Years of education: Not on file   Highest education level: Not on file  Occupational History   Not on file  Tobacco Use   Smoking status: Former    Pack years: 0.00   Smokeless tobacco: Never  Vaping Use   Vaping Use: Never used  Substance and Sexual Activity   Alcohol use: Yes    Comment: rarely   Drug use: Never   Sexual activity: Not Currently  Other Topics Concern   Not on file  Social History Narrative   Not on file   Social Determinants of Health   Financial Resource Strain: Medium Risk   Difficulty of Paying Living Expenses: Somewhat hard  Food Insecurity: Not on file  Transportation Needs: Not on file  Physical Activity: Sufficiently Active   Days of Exercise per Week: 5 days   Minutes of Exercise per Session: 30 min  Stress: No Stress Concern Present   Feeling of Stress : Not at all  Social Connections: Moderately Isolated   Frequency of Communication with Friends and Family: Three times a week   Frequency of Social Gatherings with Friends and Family: Once a week   Attends Religious Services: Never   Marine scientist or Organizations: No   Attends Music therapist: Never   Marital Status: Married    Tobacco Counseling Counseling given: Not Answered   Clinical Intake:  Pre-visit preparation completed: Yes  Pain : No/denies pain     Nutritional Risks: None Diabetes: Yes CBG done?: No Did pt. bring in  CBG monitor from home?: No  How often do you need to have someone help you when you read instructions, pamphlets, or other written materials from your doctor or pharmacy?: 2 - Rarely (daughter translates)  Diabetic yes  Nutrition Risk Assessment:  Has the patient had any N/V/D within the last 2 months?  No  Does the patient have any non-healing wounds?  No  Has the patient had any unintentional weight loss or weight gain?  No   Diabetes:  Is the patient diabetic?  Yes  If diabetic, was a CBG obtained today?  No  Did the patient bring in their glucometer from home?  No  How often do you monitor your CBG's? 2 times daily.   Financial Strains and Diabetes Management:  Are you having any financial strains with the device, your supplies or your medication? No .  Does the patient want to be seen by Chronic Care Management for  management of their diabetes?  Yes  Would the patient like to be referred to a Nutritionist or for Diabetic Management?  No   Diabetic Exams:  Diabetic Eye Exam:  Overdue for diabetic eye exam. Pt has been advised about the importance in completing this exam. A referral has been placed today. Message sent to referral coordinator for scheduling purposes. Advised pt to expect a call from office referred to regarding appt.  Diabetic Foot Exam: Completed 11-25-2020. Pt has been advised about the importance in completing this exam. Pt is scheduled for diabetic foot exam on .    Interpreter Needed?: Yes Patient Declined Interpreter : Yes Interpretation services provided by: Family member interpreted visit Patient signed [place] waiver: No  Information entered by :: Leroy Kennedy LPN   Activities of Daily Living In your present state of health, do you have any difficulty performing the following activities: 02/09/2021 02/09/2021  Hearing? N N  Vision? N N  Difficulty concentrating or making decisions? N N  Walking or climbing stairs? N N  Dressing or bathing? N N   Doing errands, shopping? N N  Preparing Food and eating ? N N  Using the Toilet? N N  In the past six months, have you accidently leaked urine? N N  Do you have problems with loss of bowel control? N N  Managing your Medications? N N  Managing your Finances? N N  Housekeeping or managing your Housekeeping? N N  Some recent data might be hidden    Patient Care Team: [name], NP as PCP - General (Adult Health Nurse Practitioner) Constance Haw, MD as PCP - Electrophysiology (Cardiology) Bensimhon, Shaune Pascal, MD as PCP - Advanced Heart Failure (Cardiology)  Indicate any recent Medical Services you may have received from other than [facility] providers in the past year (date may be approximate).     Assessment:   This is a routine wellness examination for [place].  Hearing/Vision screen Hearing Screening - Comments:: No trouble hearing Vision Screening - Comments:: Patient daughter will call to schedule an eye exam  Stated been 2 years  Dietary issues and exercise activities discussed: Current Exercise Habits: Home exercise routine, Type of exercise: walking, Time (Minutes): 30, Frequency (Times/Week): 5, Weekly Exercise (Minutes/Week): 150, Intensity: Mild   Goals Addressed             This Visit's Progress    Patient Stated       Maintain current lifestyle        Depression Screen PHQ 2/9 Scores 02/09/2021 11/25/2020 06/06/2020 12/07/2019 09/28/2019 09/07/2019 06/08/2019  PHQ - 2 Score 0 0 0 0 0 0 0    Fall Risk Fall Risk  02/09/2021 11/25/2020 06/06/2020 12/07/2019 09/28/2019  Falls in the past year? 0 0 0 0 0  Number falls in past yr: 0 - 0 0 0  Injury with Fall? 1 - 0 0 0  Follow up Falls evaluation completed;Falls prevention discussed Falls evaluation completed Falls evaluation completed Falls evaluation completed -    FALL RISK PREVENTION PERTAINING TO THE HOME:  Any stairs in or around the home? Yes  If so, are there any without handrails? Yes  Home free of  loose throw rugs in walkways, pet beds, electrical cords, etc? Yes  Adequate lighting in your home to reduce risk of falls? Yes   ASSISTIVE DEVICES UTILIZED TO PREVENT FALLS:  Life alert? No  Use of a cane, walker or w/c? No  Grab bars in the bathroom? No  Shower chair or bench in shower? Yes  Elevated toilet seat or a handicapped toilet? No   TIMED UP AND GO:  Was the test performed? No .   Tele-Health visit    Cognitive Function:  Normal cognitive status assessed by direct observation by this Nurse Health Advisor. No abnormalities found.          Immunizations Immunization History  Administered Date(s) Administered   Fluad Quad(high Dose 65+) 06/08/2019, 06/06/2020    TDAP status: Due, Education has been provided regarding the importance of this vaccine. Advised may receive this vaccine at local pharmacy or Health Dept. Aware to provide a copy of the vaccination record if obtained from local pharmacy or Health Dept. Verbalized acceptance and understanding.  Flu Vaccine status: Up to date  Pneumococcal vaccine status: Due, Education has been provided regarding the importance of this vaccine. Advised may receive this vaccine at local pharmacy or Health Dept. Aware to provide a copy of the vaccination record if obtained from local pharmacy or Health Dept. Verbalized acceptance and understanding.  Covid-19 vaccine status: Completed vaccines  Qualifies for Shingles Vaccine? Yes   Zostavax completed No   Shingrix Completed?: No.    Education has been provided regarding the importance of this vaccine. Patient has been advised to call insurance company to determine out of pocket expense if they have not yet received this vaccine. Advised may also receive vaccine at local pharmacy or Health Dept. Verbalized acceptance and understanding.  Screening Tests Health Maintenance  Topic Date Due   COVID-19 Vaccine (1) Never done   OPHTHALMOLOGY EXAM  Never done   TETANUS/TDAP  Never  done   COLONOSCOPY (Pts 45-31yr Insurance coverage will need to be confirmed)  Never done   Zoster Vaccines- Shingrix (1 of 2) Never done   PNA vac Low Risk Adult (1 of 2 - PCV13) 06/06/2021 (Originally 06/28/2015)   INFLUENZA VACCINE  03/23/2021   HEMOGLOBIN A1C  05/27/2021   FOOT EXAM  11/25/2021   Hepatitis C Screening  Completed   HPV VACCINES  Aged Out    Health Maintenance  Health Maintenance Due  Topic Date Due   COVID-19 Vaccine (1) Never done   OPHTHALMOLOGY EXAM  Never done   TETANUS/TDAP  Never done   COLONOSCOPY (Pts 45-44yrInsurance coverage will need to be confirmed)  Never done   Zoster Vaccines- Shingrix (1 of 2) Never done    Colonoscopy no referral placed at this time  Lung Cancer Screening: (Low Dose CT Chest recommended if Age 71-80ears, 30 pack-year currently smoking OR have quit w/in 15years.) does not qualify.   Lung Cancer Screening Referral: na  Additional Screening:  Hepatitis C Screening: does not qualify; Completed 11-2019  Vision Screening: Recommended annual ophthalmology exams for early detection of glaucoma and other disorders of the eye. Is the patient up to date with their annual eye exam?  No  Who is the provider or what is the name of the office in which the patient attends annual eye exams?   Patient will call and schedule  If pt is not established with a provider, would they like to be referred to a provider to establish care? Yes .   Dental Screening: Recommended annual dental exams for proper oral hygiene  Community Resource Referral / Chronic Care Management: CRR required this visit?  No   CCM required this visit?  No      Plan:     I have personally reviewed and noted the following  in the patient's chart:   Medical and social history Use of alcohol, tobacco or illicit drugs  Current medications and supplements including opioid prescriptions. Patient is not currently taking opioid prescriptions. Functional ability and  status Nutritional status Physical activity Advanced directives List of other physicians Hospitalizations, surgeries, and ER visits in previous 12 months Vitals Screenings to include cognitive, depression, and falls Referrals and appointments  In addition, I have reviewed and discussed with patient certain preventive protocols, quality metrics, and best practice recommendations. A written personalized care plan for preventive services as well as general preventive health recommendations were provided to patient.     Leroy Kennedy, LPN   0/04/2329   Nurse Notes: NA

## 2021-02-09 NOTE — Patient Instructions (Signed)
Lawrence Olson , Thank you for taking time to come for your Medicare Wellness Visit. I appreciate your ongoing commitment to your health goals. Please review the following plan we discussed and let me know if I can assist you in the future.   Screening recommendations/referrals: Colonoscopy: will call when ready for referral Recommended yearly ophthalmology/optometry visit for glaucoma screening and checkup Recommended yearly dental visit for hygiene and checkup  Vaccinations: Influenza vaccine: up to date Pneumococcal vaccine: Due education provided Tdap vaccine: Due education provided Shingles vaccine: Due education provided  Advanced directives: copy requested  Conditions/risks identified: no    Preventive Care 95 Years and Older, Male Preventive care refers to lifestyle choices and visits with your health care provider that can promote health and wellness. What does preventive care include? A yearly physical exam. This is also called an annual well check. Dental exams once or twice a year. Routine eye exams. Ask your health care provider how often you should have your eyes checked. Personal lifestyle choices, including: Daily care of your teeth and gums. Regular physical activity. Eating a healthy diet. Avoiding tobacco and drug use. Limiting alcohol use. Practicing safe sex. Taking low doses of aspirin every day. Taking vitamin and mineral supplements as recommended by your health care provider. What happens during an annual well check? The services and screenings done by your health care provider during your annual well check will depend on your age, overall health, lifestyle risk factors, and family history of disease. Counseling  Your health care provider may ask you questions about your: Alcohol use. Tobacco use. Drug use. Emotional well-being. Home and relationship well-being. Sexual activity. Eating habits. History of falls. Memory and ability to understand  (cognition). Work and work Statistician. Screening  You may have the following tests or measurements: Height, weight, and BMI. Blood pressure. Lipid and cholesterol levels. These may be checked every 5 years, or more frequently if you are over 68 years old. Skin check. Lung cancer screening. You may have this screening every year starting at age 73 if you have a 30-pack-year history of smoking and currently smoke or have quit within the past 15 years. Fecal occult blood test (FOBT) of the stool. You may have this test every year starting at age 30. Flexible sigmoidoscopy or colonoscopy. You may have a sigmoidoscopy every 5 years or a colonoscopy every 10 years starting at age 54. Prostate cancer screening. Recommendations will vary depending on your family history and other risks. Hepatitis C blood test. Hepatitis B blood test. Sexually transmitted disease (STD) testing. Diabetes screening. This is done by checking your blood sugar (glucose) after you have not eaten for a while (fasting). You may have this done every 1-3 years. Abdominal aortic aneurysm (AAA) screening. You may need this if you are a current or former smoker. Osteoporosis. You may be screened starting at age 44 if you are at high risk. Talk with your health care provider about your test results, treatment options, and if necessary, the need for more tests. Vaccines  Your health care provider may recommend certain vaccines, such as: Influenza vaccine. This is recommended every year. Tetanus, diphtheria, and acellular pertussis (Tdap, Td) vaccine. You may need a Td booster every 10 years. Zoster vaccine. You may need this after age 68. Pneumococcal 13-valent conjugate (PCV13) vaccine. One dose is recommended after age 53. Pneumococcal polysaccharide (PPSV23) vaccine. One dose is recommended after age 66. Talk to your health care provider about which screenings and vaccines you need and  how often you need them. This  information is not intended to replace advice given to you by your health care provider. Make sure you discuss any questions you have with your health care provider. Document Released: 09/05/2015 Document Revised: 04/28/2016 Document Reviewed: 06/10/2015 Elsevier Interactive Patient Education  2017 [employer] Prevention in the Home Falls can cause injuries. They can happen to people of all ages. There are many things you can do to make your home safe and to help prevent falls. What can I do on the outside of my home? Regularly fix the edges of walkways and driveways and fix any cracks. Remove anything that might make you trip as you walk through a door, such as a raised step or threshold. Trim any bushes or trees on the path to your home. Use bright outdoor lighting. Clear any walking paths of anything that might make someone trip, such as rocks or tools. Regularly check to see if handrails are loose or broken. Make sure that both sides of any steps have handrails. Any raised decks and porches should have guardrails on the edges. Have any leaves, snow, or ice cleared regularly. Use sand or salt on walking paths during winter. Clean up any spills in your garage right away. This includes oil or grease spills. What can I do in the bathroom? Use night lights. Install grab bars by the toilet and in the tub and shower. Do not use towel bars as grab bars. Use non-skid mats or decals in the tub or shower. If you need to sit down in the shower, use a plastic, non-slip stool. Keep the floor dry. Clean up any water that spills on the floor as soon as it happens. Remove soap buildup in the tub or shower regularly. Attach bath mats securely with double-sided non-slip rug tape. Do not have throw rugs and other things on the floor that can make you trip. What can I do in the bedroom? Use night lights. Make sure that you have a light by your bed that is easy to reach. Do not use any sheets or  blankets that are too big for your bed. They should not hang down onto the floor. Have a firm chair that has side arms. You can use this for support while you get dressed. Do not have throw rugs and other things on the floor that can make you trip. What can I do in the kitchen? Clean up any spills right away. Avoid walking on wet floors. Keep items that you use a lot in easy-to-reach places. If you need to reach something above you, use a strong step stool that has a grab bar. Keep electrical cords out of the way. Do not use floor polish or wax that makes floors slippery. If you must use wax, use non-skid floor wax. Do not have throw rugs and other things on the floor that can make you trip. What can I do with my stairs? Do not leave any items on the stairs. Make sure that there are handrails on both sides of the stairs and use them. Fix handrails that are broken or loose. Make sure that handrails are as long as the stairways. Check any carpeting to make sure that it is firmly attached to the stairs. Fix any carpet that is loose or worn. Avoid having throw rugs at the top or bottom of the stairs. If you do have throw rugs, attach them to the floor with carpet tape. Make sure that you have  a light switch at the top of the stairs and the bottom of the stairs. If you do not have them, ask someone to add them for you. What else can I do to help prevent falls? Wear shoes that: Do not have high heels. Have rubber bottoms. Are comfortable and fit you well. Are closed at the toe. Do not wear sandals. If you use a stepladder: Make sure that it is fully opened. Do not climb a closed stepladder. Make sure that both sides of the stepladder are locked into place. Ask someone to hold it for you, if possible. Clearly mark and make sure that you can see: Any grab bars or handrails. First and last steps. Where the edge of each step is. Use tools that help you move around (mobility aids) if they are  needed. These include: Canes. Walkers. Scooters. Crutches. Turn on the lights when you go into a dark area. Replace any light bulbs as soon as they burn out. Set up your furniture so you have a clear path. Avoid moving your furniture around. If any of your floors are uneven, fix them. If there are any pets around you, be aware of where they are. Review your medicines with your doctor. Some medicines can make you feel dizzy. This can increase your chance of falling. Ask your doctor what other things that you can do to help prevent falls. This information is not intended to replace advice given to you by your health care provider. Make sure you discuss any questions you have with your health care provider. Document Released: 06/05/2009 Document Revised: 01/15/2016 Document Reviewed: 09/13/2014 Elsevier Interactive Patient Education  2017 [employer].

## 2021-02-16 ENCOUNTER — Ambulatory Visit (INDEPENDENT_AMBULATORY_CARE_PROVIDER_SITE_OTHER): Payer: PPO

## 2021-02-16 DIAGNOSIS — I469 Cardiac arrest, cause unspecified: Secondary | ICD-10-CM

## 2021-02-16 LAB — CUP PACEART REMOTE DEVICE CHECK
Battery Remaining Longevity: 123 mo
Battery Voltage: 3.03 V
Brady Statistic RV Percent Paced: 0.03 %
Date Time Interrogation Session: 20220627044222
HighPow Impedance: 69 Ohm
Implantable Lead Implant Date: 20201224
Implantable Lead Location: 753860
Implantable Pulse Generator Implant Date: 20201224
Lead Channel Impedance Value: 285 Ohm
Lead Channel Impedance Value: 304 Ohm
Lead Channel Pacing Threshold Amplitude: 0.75 V
Lead Channel Pacing Threshold Pulse Width: 0.4 ms
Lead Channel Sensing Intrinsic Amplitude: 7.75 mV
Lead Channel Sensing Intrinsic Amplitude: 7.75 mV
Lead Channel Setting Pacing Amplitude: 2 V
Lead Channel Setting Pacing Pulse Width: 0.4 ms
Lead Channel Setting Sensing Sensitivity: 0.3 mV

## 2021-03-04 NOTE — Progress Notes (Signed)
Remote ICD transmission.   

## 2021-04-06 ENCOUNTER — Other Ambulatory Visit: Payer: Self-pay | Admitting: Registered Nurse

## 2021-05-04 ENCOUNTER — Other Ambulatory Visit (HOSPITAL_COMMUNITY): Payer: Self-pay

## 2021-05-04 MED ORDER — ATORVASTATIN CALCIUM 80 MG PO TABS: ORAL_TABLET | ORAL | 6 refills | Status: DC

## 2021-05-18 ENCOUNTER — Ambulatory Visit (INDEPENDENT_AMBULATORY_CARE_PROVIDER_SITE_OTHER): Payer: PPO

## 2021-05-18 DIAGNOSIS — I469 Cardiac arrest, cause unspecified: Secondary | ICD-10-CM

## 2021-05-18 LAB — CUP PACEART REMOTE DEVICE CHECK
Battery Remaining Longevity: 121 mo
Battery Voltage: 3.02 V
Brady Statistic RV Percent Paced: 0.09 %
Date Time Interrogation Session: 20220926022724
HighPow Impedance: 70 Ohm
Implantable Lead Implant Date: 20201224
Implantable Lead Location: 753860
Implantable Pulse Generator Implant Date: 20201224
Lead Channel Impedance Value: 285 Ohm
Lead Channel Impedance Value: 304 Ohm
Lead Channel Pacing Threshold Amplitude: 0.875 V
Lead Channel Pacing Threshold Pulse Width: 0.4 ms
Lead Channel Sensing Intrinsic Amplitude: 6.625 mV
Lead Channel Sensing Intrinsic Amplitude: 6.625 mV
Lead Channel Setting Pacing Amplitude: 2 V
Lead Channel Setting Pacing Pulse Width: 0.4 ms
Lead Channel Setting Sensing Sensitivity: 0.3 mV

## 2021-05-22 NOTE — Progress Notes (Signed)
Remote ICD transmission.   

## 2021-06-27 ENCOUNTER — Other Ambulatory Visit (HOSPITAL_COMMUNITY): Payer: Self-pay | Admitting: Internal Medicine

## 2021-07-05 ENCOUNTER — Other Ambulatory Visit: Payer: Self-pay | Admitting: Registered Nurse

## 2021-07-07 ENCOUNTER — Encounter (HOSPITAL_COMMUNITY): Payer: Self-pay | Admitting: Internal Medicine

## 2021-07-07 ENCOUNTER — Ambulatory Visit (HOSPITAL_COMMUNITY)
Admission: RE | Admit: 2021-07-07 | Discharge: 2021-07-07 | Disposition: A | Discharge status: Home / Self Care | Payer: PPO | Source: Ambulatory Visit | Attending: Internal Medicine | Admitting: Internal Medicine

## 2021-07-07 ENCOUNTER — Telehealth: Payer: Self-pay | Admitting: Registered Nurse

## 2021-07-07 VITALS — BP 102/68 | HR 60 | Wt 186.4 lb

## 2021-07-07 DIAGNOSIS — Z95810 Presence of automatic (implantable) cardiac defibrillator: Secondary | ICD-10-CM | POA: Diagnosis not present

## 2021-07-07 DIAGNOSIS — E1122 Type 2 diabetes mellitus with diabetic chronic kidney disease: Secondary | ICD-10-CM | POA: Insufficient documentation

## 2021-07-07 DIAGNOSIS — I5043 Acute on chronic combined systolic (congestive) and diastolic (congestive) heart failure: Secondary | ICD-10-CM | POA: Diagnosis not present

## 2021-07-07 DIAGNOSIS — I252 Old myocardial infarction: Secondary | ICD-10-CM | POA: Insufficient documentation

## 2021-07-07 DIAGNOSIS — E7849 Other hyperlipidemia: Secondary | ICD-10-CM

## 2021-07-07 DIAGNOSIS — N1831 Chronic kidney disease, stage 3a: Secondary | ICD-10-CM | POA: Insufficient documentation

## 2021-07-07 DIAGNOSIS — Z7982 Long term (current) use of aspirin: Secondary | ICD-10-CM | POA: Insufficient documentation

## 2021-07-07 DIAGNOSIS — I2510 Atherosclerotic heart disease of native coronary artery without angina pectoris: Secondary | ICD-10-CM | POA: Diagnosis not present

## 2021-07-07 DIAGNOSIS — Z8674 Personal history of sudden cardiac arrest: Secondary | ICD-10-CM | POA: Insufficient documentation

## 2021-07-07 DIAGNOSIS — Z7984 Long term (current) use of oral hypoglycemic drugs: Secondary | ICD-10-CM | POA: Diagnosis not present

## 2021-07-07 DIAGNOSIS — Z79899 Other long term (current) drug therapy: Secondary | ICD-10-CM | POA: Diagnosis not present

## 2021-07-07 DIAGNOSIS — I5022 Chronic systolic (congestive) heart failure: Secondary | ICD-10-CM | POA: Diagnosis not present

## 2021-07-07 DIAGNOSIS — I255 Ischemic cardiomyopathy: Secondary | ICD-10-CM | POA: Diagnosis not present

## 2021-07-07 LAB — CBC
HCT: 45.4 % (ref 39.0–52.0)
Hemoglobin: 14.6 g/dL (ref 13.0–17.0)
MCH: 31.4 pg (ref 26.0–34.0)
MCHC: 32.2 g/dL (ref 30.0–36.0)
MCV: 97.6 fL (ref 80.0–100.0)
Platelets: 321 10*3/uL (ref 150–400)
RBC: 4.65 MIL/uL (ref 4.22–5.81)
RDW: 13.5 % (ref 11.5–15.5)
WBC: 10.2 10*3/uL (ref 4.0–10.5)
nRBC: 0 % (ref 0.0–0.2)

## 2021-07-07 LAB — COMPREHENSIVE METABOLIC PANEL
ALT: 25 U/L (ref 0–44)
AST: 24 U/L (ref 15–41)
Albumin: 3.9 g/dL (ref 3.5–5.0)
Alkaline Phosphatase: 75 U/L (ref 38–126)
Anion gap: 7 (ref 5–15)
BUN: 27 mg/dL — ABNORMAL HIGH (ref 8–23)
CO2: 25 mmol/L (ref 22–32)
Calcium: 9.3 mg/dL (ref 8.9–10.3)
Chloride: 105 mmol/L (ref 98–111)
Creatinine, Ser: 1.18 mg/dL (ref 0.61–1.24)
GFR, Estimated: >60 mL/min (ref >60)
Glucose, Bld: 105 mg/dL — ABNORMAL HIGH (ref 70–99)
Potassium: 4.3 mmol/L (ref 3.5–5.1)
Sodium: 137 mmol/L (ref 135–145)
Total Bilirubin: 1.1 mg/dL (ref 0.3–1.2)
Total Protein: 7 g/dL (ref 6.5–8.1)

## 2021-07-07 LAB — LIPID PANEL
Cholesterol: 118 mg/dL (ref 0–200)
HDL: 42 mg/dL (ref >40)
LDL Cholesterol: 52 mg/dL (ref 0–99)
Total CHOL/HDL Ratio: 2.8 RATIO
Triglycerides: 119 mg/dL (ref <150)
VLDL: 24 mg/dL (ref 0–40)

## 2021-07-07 NOTE — Progress Notes (Signed)
ADVANCED HF CLINIC NOTE    HF Cardiologist: Ardenia Stiner  HPI:  Lawrence Olson is a 70 year old male from [place] with h/o CAD, systolic HF and VF cardiac arrest.   He had no known medical problems until 04/16/19 when he experienced witnessed VF arrest at work. Had bystander CPR x 20 mins with defib and intubation in field. He was taken to cath lab for emergent catheterization. Attempts at opening LAD unsuccessful (felt to be chronic). Impella placed. EF 25-30%. Had protracted course with respiratory failure, renal failure (requiring CVVHD) and anoxic brain injury. There was also a question of acute amio pneumonitis with ESR> 100. Discharged to CIR. Creatinine plateaued at 2.4 so ACE/ARB/ARNI not started.   Had ICD placed 08/16/19 by Dr. Curt Bears   Here with his wife. Walking 30-40 mins every day with his dog. No CP or SOB. No edema, orthopnea or PND. Compliant with meds.   ICD interrogation: Optivol ok. No VT/AF. Activity 3-4 hour/day. Personally reviewed   Echo 10/23/20: EF 40-45% RV ok Personally reviewed   Echo 12/20 EF 30-35% Personally reviewed   ROS: All systems negative except as listed in HPI, PMH and Problem List.  SH:  Social History   Socioeconomic History   Marital status: Married    Spouse name: Not on file   Number of children: Not on file   Years of education: Not on file   Highest education level: Not on file  Occupational History   Not on file  Tobacco Use   Smoking status: Former   Smokeless tobacco: Never  Vaping Use   Vaping Use: Never used  Substance and Sexual Activity   Alcohol use: Yes    Comment: rarely   Drug use: Never   Sexual activity: Not Currently  Other Topics Concern   Not on file  Social History Narrative   Not on file   Social Determinants of Health   Financial Resource Strain: Medium Risk   Difficulty of Paying Living Expenses: Somewhat hard  Food Insecurity: Not on file  Transportation Needs: Not on file  Physical Activity:  Sufficiently Active   Days of Exercise per Week: 5 days   Minutes of Exercise per Session: 30 min  Stress: No Stress Concern Present   Feeling of Stress : Not at all  Social Connections: Moderately Isolated   Frequency of Communication with Friends and Family: Three times a week   Frequency of Social Gatherings with Friends and Family: Once a week   Attends Religious Services: Never   Marine scientist or Organizations: No   Attends Music therapist: Never   Marital Status: Married  Human resources officer Violence: Not on file    FH:  Family History  Problem Relation Age of Onset   Healthy Brother    Healthy Sister     Past Medical History:  Diagnosis Date   Acute blood loss anemia    Acute on chronic combined systolic and diastolic congestive heart failure (HCC)    Acute respiratory failure (HCC)    Acute ST elevation myocardial infarction (STEMI) involving left anterior descending (LAD) coronary artery ([place]) 04/16/2019   AKI (acute kidney injury) ([place])    Anoxic brain injury ([place])    Cardiac arrest ([place]) 04/16/2019   Cardiogenic shock ([place]) 04/16/2019   Controlled type 2 diabetes mellitus with hyperglycemia ([place])    Coronary artery disease involving native coronary artery of native heart without angina pectoris    Jaundice    needed  hospitalization in [place]   Leukocytosis    Myocardial infarction ([place])    Thrombocytosis    VT (ventricular tachycardia)     Current Outpatient Medications  Medication Sig Dispense Refill   aspirin 81 MG chewable tablet Chew 1 tablet (81 mg total) by mouth daily.     atorvastatin (LIPITOR) 80 MG tablet TAKE 1 TABLET(80 MG) BY MOUTH DAILY AT 6 PM 30 tablet 6   B Complex-C (B-COMPLEX WITH VITAMIN C) tablet Take 1 tablet by mouth daily.     blood glucose meter kit and supplies KIT Dispense based on patient and insurance preference. Use up to four times daily as directed. (FOR ICD-9 250.00, 250.01). 1 each 0   carvedilol (COREG)  6.25 MG tablet TAKE 1 TABLET(6.25 MG) BY MOUTH TWICE DAILY WITH A MEAL 180 tablet 3   isosorbide mononitrate (IMDUR) 30 MG 24 hr tablet Take 0.5 tablets (15 mg total) by mouth daily. 45 tablet 3   [name] 10 MG TABS tablet TAKE 1 TABLET BY MOUTH DAILY BEFORE BREAKFAST 90 tablet 3   Lancet Device MISC 1 Units by Does not apply route daily before breakfast. 90 each 0   Lancets (ONETOUCH DELICA PLUS [id number]) MISC USE AS DIRECTED DAILY BEFORE BREAKFAST 100 each 3   ONETOUCH ULTRA test strip USE TO CHECK BLOOD GLUCOSE UP TO FOUR TIMES DAILY AS DIRECTED 300 strip 1   sacubitril-valsartan (ENTRESTO) 97-103 MG Take 1 tablet by mouth 2 (two) times daily. 60 tablet 6   tamsulosin (FLOMAX) 0.4 MG CAPS capsule TAKE 1 CAPSULE(0.4 MG) BY MOUTH DAILY AFTER AND SUPPER 90 capsule 1   No current facility-administered medications for this encounter.    Vitals:   07/07/21 1054  BP: 102/68  Pulse: 60  SpO2: 97%  Weight: 84.6 kg (186 lb 6.4 oz)    PHYSICAL EXAM: General:  Well appearing. No resp difficulty HEENT: normal Neck: supple. no JVD. Carotids 2+ bilat; no bruits. No lymphadenopathy or thryomegaly appreciated. Cor: PMI nondisplaced. Regular rate & rhythm. No rubs, gallops or murmurs. Lungs: clear Abdomen: soft, nontender, nondistended. No hepatosplenomegaly. No bruits or masses. Good bowel sounds. Extremities: no cyanosis, clubbing, rash, edema Neuro: alert & orientedx3, cranial nerves grossly intact. moves all 4 extremities w/o difficulty. Affect pleasant   ASSESSMENT & PLAN:  1. Chronic systolic HF - Echo 6/83 EF 25-30% in setting of cardiac arrest and severe iCM - Echo 12/20 EF 30-35% - Echo today 10/23/20: EF 40-45% RV ok Personally reviewed - s/p MDT ICD (Dr. Lennie Odor)  - ICD interrogated personally. See above - Doing great. NYHA I. Volume looks good.  - Continue  carvedilol 6.25 bid  - Continue spiro 12.5 daily  - Continue Entresto to 97/103 bid - Continue [name] 10  - Doing  great. On great meds. BP a little low but tolerates.   2. CAD s/p recent anterior MI  - treated medically as unable to open LAD CTO - No s/s ischemia  Films reviewed with IC team and not options for PCI - Continue ASA/statin/[name] - Last LDL 42  3. H/o VF arrest - s/p MDT ICD. As above  4. DM2  - Continue [name].   5. CKD 3a - last creatinine 1.3 - check labs today   Glori Bickers, MD  11:00 AM

## 2021-07-07 NOTE — Patient Instructions (Signed)
Keep take medication as prescribed.  Labs done today, your results will be available in MyChart, we will contact you for abnormal readings.  Your physician recommends that you schedule a follow-up appointment in: 1 year. (November 2023). **Please call the office in September 2023 to schedule your appointment.**  If you have any questions or concerns before your next appointment please send [place] a message through [place] or call our office at [phone].    TO LEAVE A MESSAGE FOR THE NURSE SELECT OPTION 2, PLEASE LEAVE A MESSAGE INCLUDING: YOUR NAME DATE OF BIRTH CALL BACK NUMBER REASON FOR CALL**this is important as we prioritize the call backs  YOU WILL RECEIVE A CALL BACK THE SAME DAY AS LONG AS YOU CALL BEFORE 4:00 PM  At the [facility], you and your health needs are our priority. As part of our continuing mission to provide you with exceptional heart care, we have created designated Provider Care Teams. These Care Teams include your primary Cardiologist (physician) and Advanced Practice Providers (APPs- Physician Assistants and Nurse Practitioners) who all work together to provide you with the care you need, when you need it.   You may see any of the following providers on your designated Care Team at your next follow up: Dr Glori Bickers Dr Haynes Kerns, NP Lyda Jester, [place] [facility] [place], [place] Audry Riles, PharmD   Please be sure to bring in all your medications bottles to every appointment.

## 2021-07-07 NOTE — Addendum Note (Signed)
Encounter addended by: Jerl Mina, RN on: 07/07/2021 11:47 AM  Actions taken: Pend clinical note, Charge Capture section accepted, Clinical Note Signed

## 2021-07-07 NOTE — Addendum Note (Signed)
Encounter addended by: Jerl Mina, RN on: 07/07/2021 11:41 AM  Actions taken: Visit diagnoses modified, Order list changed, Diagnosis association updated

## 2021-07-10 NOTE — Telephone Encounter (Signed)
Patient's daughter said he does not need to be seen right now because he is seeing his cardiologist for everything

## 2021-07-28 ENCOUNTER — Telehealth (HOSPITAL_COMMUNITY): Payer: Self-pay | Admitting: Pharmacy Technician

## 2021-07-28 NOTE — Telephone Encounter (Signed)
Advanced Heart Failure Patient Advocate Encounter  Patient's daughter came and brought [employer] assistance renewal paperwork but did not sign. Called and left the patient's daughter a message. She can pick the paperwork back up or I can send a docusign link.  [name], CPhT

## 2021-07-29 NOTE — Telephone Encounter (Signed)
Spoke with patient's daughter, sent docusign link.   Will fax in once signatures are obtained.

## 2021-07-31 ENCOUNTER — Other Ambulatory Visit (HOSPITAL_COMMUNITY): Payer: Self-pay | Admitting: *Deleted

## 2021-07-31 MED ORDER — ENTRESTO 97-103 MG PO TABS: 1.0000 | ORAL_TABLET | Freq: Two times a day (BID) | ORAL | 3 refills | Status: DC

## 2021-08-11 NOTE — Telephone Encounter (Signed)
Sent in application via fax.  Will follow up.  

## 2021-08-18 ENCOUNTER — Ambulatory Visit (INDEPENDENT_AMBULATORY_CARE_PROVIDER_SITE_OTHER): Payer: PPO

## 2021-08-18 DIAGNOSIS — I255 Ischemic cardiomyopathy: Secondary | ICD-10-CM | POA: Diagnosis not present

## 2021-08-18 LAB — CUP PACEART REMOTE DEVICE CHECK
Battery Remaining Longevity: 119 mo
Battery Voltage: 3.02 V
Brady Statistic RV Percent Paced: 0.11 %
Date Time Interrogation Session: 20221226001605
HighPow Impedance: 67 Ohm
Implantable Lead Implant Date: 20201224
Implantable Lead Location: 753860
Implantable Pulse Generator Implant Date: 20201224
Lead Channel Impedance Value: 285 Ohm
Lead Channel Impedance Value: 342 Ohm
Lead Channel Pacing Threshold Amplitude: 0.625 V
Lead Channel Pacing Threshold Pulse Width: 0.4 ms
Lead Channel Sensing Intrinsic Amplitude: 8.25 mV
Lead Channel Sensing Intrinsic Amplitude: 8.25 mV
Lead Channel Setting Pacing Amplitude: 2 V
Lead Channel Setting Pacing Pulse Width: 0.4 ms
Lead Channel Setting Sensing Sensitivity: 0.3 mV

## 2021-08-26 NOTE — Progress Notes (Signed)
Remote ICD transmission.   

## 2021-09-04 ENCOUNTER — Telehealth (HOSPITAL_COMMUNITY): Payer: Self-pay | Admitting: Pharmacist

## 2021-09-04 NOTE — Telephone Encounter (Signed)
Advanced Heart Failure Patient Advocate Encounter   Patient was approved to receive Entresto from [employer].   Patient ID: [id number] Effective dates: 08/23/21 through 08/22/22  Audry Riles, PharmD, BCPS, BCCP, CPP Heart Failure Clinic Pharmacist [phone]

## 2021-10-14 ENCOUNTER — Other Ambulatory Visit: Payer: Self-pay | Admitting: Registered Nurse

## 2021-11-16 ENCOUNTER — Ambulatory Visit (INDEPENDENT_AMBULATORY_CARE_PROVIDER_SITE_OTHER): Payer: PPO

## 2021-11-16 DIAGNOSIS — I255 Ischemic cardiomyopathy: Secondary | ICD-10-CM | POA: Diagnosis not present

## 2021-11-17 LAB — CUP PACEART REMOTE DEVICE CHECK
Battery Remaining Longevity: 117 mo
Battery Voltage: 3.01 V
Brady Statistic RV Percent Paced: 0.15 %
Date Time Interrogation Session: 20230327033524
HighPow Impedance: 70 Ohm
Implantable Lead Implant Date: 20201224
Implantable Lead Location: 753860
Implantable Pulse Generator Implant Date: 20201224
Lead Channel Impedance Value: 247 Ohm
Lead Channel Impedance Value: 342 Ohm
Lead Channel Pacing Threshold Amplitude: 0.75 V
Lead Channel Pacing Threshold Pulse Width: 0.4 ms
Lead Channel Sensing Intrinsic Amplitude: 7.375 mV
Lead Channel Sensing Intrinsic Amplitude: 7.375 mV
Lead Channel Setting Pacing Amplitude: 2 V
Lead Channel Setting Pacing Pulse Width: 0.4 ms
Lead Channel Setting Sensing Sensitivity: 0.3 mV

## 2021-11-25 NOTE — Progress Notes (Signed)
Remote ICD transmission.   

## 2021-12-07 ENCOUNTER — Encounter: Payer: Self-pay | Admitting: Cardiology

## 2021-12-07 ENCOUNTER — Ambulatory Visit: Payer: PPO | Admitting: Cardiology

## 2021-12-07 VITALS — BP 110/60 | HR 48 | Ht 67.0 in | Wt 189.0 lb

## 2021-12-07 DIAGNOSIS — I5022 Chronic systolic (congestive) heart failure: Secondary | ICD-10-CM

## 2021-12-07 NOTE — Progress Notes (Signed)
? ?Electrophysiology Office Note ? ? ?Date:  12/07/2021  ? ?IDBon Olson, DOB Mar 27, 1951, MRN [id number] ? ?PCP:  [name], NP  ?Cardiologist:  Bensimhon ?Primary Electrophysiologist:  Cailynn Bodnar Meredith Leeds, MD   ? ?Chief Complaint: CHF ?  ?History of Present Illness: ?Lawrence Olson is a 71 y.o. male who is being seen today for the evaluation of CHF at the request of [name], NP. Presenting today for electrophysiology evaluation. ? ?He has a history significant for coronary artery disease, chronic systolic heart failure, VF arrest.  On 04/16/2019 he experienced a witnessed arrest while at work.  He had bystander CPR for 20 minutes.  Had emergent left heart catheterization but had had a totally occluded LAD and was not able to be reopened.  Impella was placed and he was taken to the ICU.  His ejection fraction was found to be 25 to 30%.  He had a protracted course complicated by respiratory failure, renal failure, anoxic brain injury.  He was discharged with a LifeVest.  He is now status post Medtronic ICD implanted 08/16/2019. ? ?Today, denies symptoms of palpitations, chest pain, shortness of breath, orthopnea, PND, lower extremity edema, claudication, dizziness, presyncope, syncope, bleeding, or neurologic sequela. The patient is tolerating medications without difficulties.   ? ?Past Medical History:  ?Diagnosis Date  ? Acute blood loss anemia   ? Acute on chronic combined systolic and diastolic congestive heart failure ([place])   ? Acute respiratory failure ([place])   ? Acute ST elevation myocardial infarction (STEMI) involving left anterior descending (LAD) coronary artery ([place]) 04/16/2019  ? AKI (acute kidney injury) ([place])   ? Anoxic brain injury ([place])   ? Cardiac arrest ([place]) 04/16/2019  ? Cardiogenic shock ([place]) 04/16/2019  ? Controlled type 2 diabetes mellitus with hyperglycemia ([place])   ? Coronary artery disease involving native coronary artery of native heart without angina pectoris   ? Jaundice   ?  needed hospitalization in [place]  ? Leukocytosis   ? Myocardial infarction [facility])   ? Thrombocytosis   ? VT (ventricular tachycardia) ([place])   ? ?Past Surgical History:  ?Procedure Laterality Date  ? ICD IMPLANT N/A 08/16/2019  ? Procedure: ICD IMPLANT;  Surgeon: Constance Haw, MD;  Location: [place];  Service: Cardiovascular;  Laterality: N/A;  ? RIGHT HEART CATH AND CORONARY ANGIOGRAPHY N/A 04/16/2019  ? Procedure: RIGHT HEART CATH AND CORONARY ANGIOGRAPHY;  Surgeon: Troy Sine, MD;  Location: [facility];  Service: Cardiovascular;  Laterality: N/A;  ? VENTRICULAR ASSIST DEVICE INSERTION N/A 04/16/2019  ? Procedure: VENTRICULAR ASSIST DEVICE INSERTION;  Surgeon: Troy Sine, MD;  Location: [place];  Service: Cardiovascular;  Laterality: N/A;  ? ? ? ?Current Outpatient Medications  ?Medication Sig Dispense Refill  ? aspirin 81 MG chewable tablet Chew 1 tablet (81 mg total) by mouth daily.    ? atorvastatin (LIPITOR) 80 MG tablet TAKE 1 TABLET(80 MG) BY MOUTH DAILY AT 6 PM 30 tablet 6  ? B Complex-C (B-COMPLEX WITH VITAMIN C) tablet Take 1 tablet by mouth daily.    ? blood glucose meter kit and supplies KIT Dispense based on patient and insurance preference. Use up to four times daily as directed. (FOR ICD-9 250.00, 250.01). 1 each 0  ? carvedilol (COREG) 6.25 MG tablet TAKE 1 TABLET(6.25 MG) BY MOUTH TWICE DAILY WITH A MEAL 180 tablet 3  ? [name] 10 MG TABS tablet TAKE 1 TABLET BY MOUTH DAILY BEFORE BREAKFAST 90  tablet 3  ? sacubitril-valsartan (ENTRESTO) 97-103 MG Take 1 tablet by mouth 2 (two) times daily. 180 tablet 3  ? tamsulosin (FLOMAX) 0.4 MG CAPS capsule TAKE 1 CAPSULE(0.4 MG) BY MOUTH DAILY AFTER SUPPER 90 capsule 1  ? ?No current facility-administered medications for this visit.  ? ? ?Allergies:   Patient has no known allergies.  ? ?Social History:  The patient  reports that he has quit smoking. He has never used smokeless tobacco. He reports current alcohol  use. He reports that he does not use drugs.  ? ?Family History:  The patient's family history includes Healthy in his brother and sister.  ? ?ROS:  Please see the history of present illness.   Otherwise, review of systems is positive for none.   All other systems are reviewed and negative.  ? ?PHYSICAL EXAM: ?VS:  BP 110/60   Pulse (!) 48   Ht '5\' 7"'  (1.702 m)   Wt 189 lb (85.7 kg)   SpO2 93%   BMI 29.60 kg/m?  , BMI Body mass index is 29.6 kg/m?. ?GEN: Well nourished, well developed, in no acute distress  ?HEENT: normal  ?Neck: no JVD, carotid bruits, or masses ?Cardiac: RRR; no murmurs, rubs, or gallops,no edema  ?Respiratory:  clear to auscultation bilaterally, normal work of breathing ?GI: soft, nontender, nondistended, + BS ?MS: no deformity or atrophy  ?Skin: warm and dry ?Neuro:  Strength and sensation are intact ?Psych: euthymic mood, full affect ? ?EKG:  EKG is ordered today. ?Personal review of the ekg ordered shows sinus rhythm, anterior Q waves, rate 48 ? ?Recent Labs: ?07/07/2021: ALT 25; BUN 27; Creatinine, Ser 1.18; Hemoglobin 14.6; Platelets 321; Potassium 4.3; Sodium 137  ? ? ?Lipid Panel  ?   ?Component Value Date/Time  ? CHOL 118 07/07/2021 1141  ? CHOL 119 09/07/2019 0943  ? TRIG 119 07/07/2021 1141  ? HDL 42 07/07/2021 1141  ? HDL 43 09/07/2019 0943  ? CHOLHDL 2.8 07/07/2021 1141  ? VLDL 24 07/07/2021 1141  ? LDLCALC 52 07/07/2021 1141  ? [place] 50 09/07/2019 0943  ? ? ? ?Wt Readings from Last 3 Encounters:  ?12/07/21 189 lb (85.7 kg)  ?07/07/21 186 lb 6.4 oz (84.6 kg)  ?11/25/20 191 lb 6.4 oz (86.8 kg)  ?  ? ? ?Other studies Reviewed: ?Additional studies/ records that were reviewed today include: TTE 07/30/19  ?Review of the above records today demonstrates:  ? 1. Left ventricular ejection fraction, by visual estimation, is 30 to 35%. The left ventricle has moderate to severely decreased function. There is moderately increased left ventricular hypertrophy. ? 2. Elevated left atrial and left  ventricular end-diastolic pressures. ? 3. Left ventricular diastolic parameters are consistent with Grade I diastolic dysfunction (impaired relaxation). ? 4. The left ventricle demonstrates global hypokinesis. ? 5. There is a false tendon in the left ventricle. ? 6. Global right ventricle has normal systolic function.The right ventricular size is normal. No increase in right ventricular wall thickness. ? 7. Left atrial size was mildly dilated. ? 8. Right atrial size was normal. ? 9. The mitral valve is normal in structure. No evidence of mitral valve regurgitation. No evidence of mitral stenosis. ?10. The tricuspid valve is normal in structure. Tricuspid valve regurgitation is mild. ?11. The aortic valve is normal in structure. Aortic valve regurgitation is mild. No evidence of aortic valve sclerosis or stenosis. ?12. The pulmonic valve was normal in structure. Pulmonic valve regurgitation is not visualized. ?13. The inferior vena cava  is normal in size with greater than 50% respiratory variability, suggesting right atrial pressure of 3 mmHg. ?14. Since the last study on 04/24/2019 LVEF has improved from 25-30% to 30-35%. Cardiac MRI is recommended for further evaluation. ? ? ?ASSESSMENT AND PLAN: ? ?1.  Chronic systolic heart failure due to ischemic cardiomyopathy: Status post Medtronic ICD.  Device functioning appropriately.  No changes at this time.  Continue carvedilol, Aldactone, Entresto, [name]. ? ?2.  Ventricular fibrillation arrest: Status post Medtronic single-chamber ICD implanted 08/16/2019.  Device functioning appropriately.  No changes. ? ?3.  Coronary artery disease: Status post anterior MI.  Unable to open and LAD CTO.  No acute ischemic symptoms.  Plan per primary cardiology. ? ?Current medicines are reviewed at length with the patient today.   ?The patient does not have concerns regarding his medicines.  The following changes were made today: none ? ?Labs/ tests ordered today include:  ?Orders  Placed This Encounter  ?Procedures  ? EKG 12-Lead  ? ? ? ?Disposition:   FU 12 months ? ?Signed, ?Evelene Roussin Meredith Leeds, MD  ?12/07/2021 4:21 PM    ? ?[facility] ?[address] ?[address] ?[place]

## 2022-01-23 ENCOUNTER — Other Ambulatory Visit (HOSPITAL_COMMUNITY): Payer: Self-pay | Admitting: Internal Medicine

## 2022-04-19 ENCOUNTER — Other Ambulatory Visit (HOSPITAL_COMMUNITY): Payer: Self-pay | Admitting: *Deleted

## 2022-04-19 MED ORDER — EMPAGLIFLOZIN 10 MG PO TABS: 10.0000 mg | ORAL_TABLET | Freq: Every day | ORAL | 3 refills | Status: DC

## 2022-04-26 ENCOUNTER — Inpatient Hospital Stay (HOSPITAL_BASED_OUTPATIENT_CLINIC_OR_DEPARTMENT_OTHER)
Admission: EM | Admit: 2022-04-26 | Discharge: 2022-04-29 | DRG: 177 | Disposition: A | Discharge status: Home / Self Care | Payer: PPO | Attending: Internal Medicine | Admitting: Internal Medicine

## 2022-04-26 ENCOUNTER — Encounter (HOSPITAL_COMMUNITY): Payer: Self-pay

## 2022-04-26 ENCOUNTER — Emergency Department (HOSPITAL_BASED_OUTPATIENT_CLINIC_OR_DEPARTMENT_OTHER): Payer: PPO

## 2022-04-26 ENCOUNTER — Other Ambulatory Visit: Payer: Self-pay

## 2022-04-26 ENCOUNTER — Encounter (HOSPITAL_BASED_OUTPATIENT_CLINIC_OR_DEPARTMENT_OTHER): Payer: Self-pay | Admitting: Emergency Medicine

## 2022-04-26 DIAGNOSIS — Z87891 Personal history of nicotine dependence: Secondary | ICD-10-CM

## 2022-04-26 DIAGNOSIS — U071 COVID-19: Secondary | ICD-10-CM | POA: Diagnosis not present

## 2022-04-26 DIAGNOSIS — E785 Hyperlipidemia, unspecified: Secondary | ICD-10-CM | POA: Diagnosis present

## 2022-04-26 DIAGNOSIS — J9601 Acute respiratory failure with hypoxia: Secondary | ICD-10-CM | POA: Diagnosis present

## 2022-04-26 DIAGNOSIS — E669 Obesity, unspecified: Secondary | ICD-10-CM | POA: Diagnosis present

## 2022-04-26 DIAGNOSIS — Z6831 Body mass index (BMI) 31.0-31.9, adult: Secondary | ICD-10-CM

## 2022-04-26 DIAGNOSIS — E119 Type 2 diabetes mellitus without complications: Secondary | ICD-10-CM | POA: Diagnosis present

## 2022-04-26 DIAGNOSIS — Z79899 Other long term (current) drug therapy: Secondary | ICD-10-CM

## 2022-04-26 DIAGNOSIS — Z7982 Long term (current) use of aspirin: Secondary | ICD-10-CM

## 2022-04-26 DIAGNOSIS — Z7984 Long term (current) use of oral hypoglycemic drugs: Secondary | ICD-10-CM

## 2022-04-26 DIAGNOSIS — N400 Enlarged prostate without lower urinary tract symptoms: Secondary | ICD-10-CM | POA: Diagnosis present

## 2022-04-26 DIAGNOSIS — I5042 Chronic combined systolic (congestive) and diastolic (congestive) heart failure: Secondary | ICD-10-CM | POA: Diagnosis present

## 2022-04-26 DIAGNOSIS — J1282 Pneumonia due to coronavirus disease 2019: Secondary | ICD-10-CM | POA: Diagnosis present

## 2022-04-26 DIAGNOSIS — Z95810 Presence of automatic (implantable) cardiac defibrillator: Secondary | ICD-10-CM

## 2022-04-26 DIAGNOSIS — E1165 Type 2 diabetes mellitus with hyperglycemia: Secondary | ICD-10-CM | POA: Diagnosis present

## 2022-04-26 DIAGNOSIS — I2510 Atherosclerotic heart disease of native coronary artery without angina pectoris: Secondary | ICD-10-CM | POA: Diagnosis present

## 2022-04-26 DIAGNOSIS — I252 Old myocardial infarction: Secondary | ICD-10-CM

## 2022-04-26 DIAGNOSIS — N179 Acute kidney failure, unspecified: Secondary | ICD-10-CM | POA: Diagnosis present

## 2022-04-26 HISTORY — DX: Chronic combined systolic (congestive) and diastolic (congestive) heart failure: I50.42

## 2022-04-26 LAB — LIPASE, BLOOD: Lipase: 63 U/L — ABNORMAL HIGH (ref 11–51)

## 2022-04-26 LAB — CBC WITH DIFFERENTIAL/PLATELET
Abs Immature Granulocytes: 0.02 10*3/uL (ref 0.00–0.07)
Basophils Absolute: 0 10*3/uL (ref 0.0–0.1)
Basophils Relative: 0 %
Eosinophils Absolute: 0 10*3/uL (ref 0.0–0.5)
Eosinophils Relative: 0 %
HCT: 43.9 % (ref 39.0–52.0)
Hemoglobin: 15.2 g/dL (ref 13.0–17.0)
Immature Granulocytes: 0 %
Lymphocytes Relative: 17 %
Lymphs Abs: 1.7 10*3/uL (ref 0.7–4.0)
MCH: 32.4 pg (ref 26.0–34.0)
MCHC: 34.6 g/dL (ref 30.0–36.0)
MCV: 93.6 fL (ref 80.0–100.0)
Monocytes Absolute: 0.7 10*3/uL (ref 0.1–1.0)
Monocytes Relative: 8 %
Neutro Abs: 7.2 10*3/uL (ref 1.7–7.7)
Neutrophils Relative %: 75 %
Platelets: 204 10*3/uL (ref 150–400)
RBC: 4.69 MIL/uL (ref 4.22–5.81)
RDW: 12.9 % (ref 11.5–15.5)
WBC: 9.7 10*3/uL (ref 4.0–10.5)
nRBC: 0 % (ref 0.0–0.2)

## 2022-04-26 LAB — COMPREHENSIVE METABOLIC PANEL
ALT: 29 U/L (ref 0–44)
AST: 28 U/L (ref 15–41)
Albumin: 3.8 g/dL (ref 3.5–5.0)
Alkaline Phosphatase: 44 U/L (ref 38–126)
Anion gap: 9 (ref 5–15)
BUN: 30 mg/dL — ABNORMAL HIGH (ref 8–23)
CO2: 22 mmol/L (ref 22–32)
Calcium: 8.3 mg/dL — ABNORMAL LOW (ref 8.9–10.3)
Chloride: 105 mmol/L (ref 98–111)
Creatinine, Ser: 1.67 mg/dL — ABNORMAL HIGH (ref 0.61–1.24)
GFR, Estimated: 43 mL/min — ABNORMAL LOW (ref >60)
Glucose, Bld: 127 mg/dL — ABNORMAL HIGH (ref 70–99)
Potassium: 3.9 mmol/L (ref 3.5–5.1)
Sodium: 136 mmol/L (ref 135–145)
Total Bilirubin: 0.9 mg/dL (ref 0.3–1.2)
Total Protein: 7.8 g/dL (ref 6.5–8.1)

## 2022-04-26 LAB — BRAIN NATRIURETIC PEPTIDE: B Natriuretic Peptide: 343.9 pg/mL — ABNORMAL HIGH (ref 0.0–100.0)

## 2022-04-26 LAB — CBG MONITORING, ED: Glucose-Capillary: 122 mg/dL — ABNORMAL HIGH (ref 70–99)

## 2022-04-26 LAB — RESP PANEL BY RT-PCR (FLU A&B, COVID) ARPGX2
Influenza A by PCR: NEGATIVE
Influenza B by PCR: NEGATIVE
SARS Coronavirus 2 by RT PCR: POSITIVE — AB

## 2022-04-26 LAB — TROPONIN I (HIGH SENSITIVITY)
Troponin I (High Sensitivity): 25 ng/L — ABNORMAL HIGH (ref <18)
Troponin I (High Sensitivity): 23 ng/L — ABNORMAL HIGH (ref <18)

## 2022-04-26 MED ORDER — ONDANSETRON HCL 4 MG/2ML IJ SOLN
4.0000 mg | Freq: Once | INTRAMUSCULAR | Status: AC
  Administered 2022-04-26: 4 mg via INTRAVENOUS
  Filled 2022-04-26: qty 2

## 2022-04-26 MED ORDER — SODIUM CHLORIDE 0.9 % IV BOLUS (SEPSIS)
500.0000 mL | Freq: Once | INTRAVENOUS | Status: AC
  Administered 2022-04-26: 500 mL via INTRAVENOUS

## 2022-04-26 MED ORDER — DEXAMETHASONE SODIUM PHOSPHATE 10 MG/ML IJ SOLN
6.0000 mg | Freq: Once | INTRAMUSCULAR | Status: AC
  Administered 2022-04-26: 6 mg via INTRAVENOUS
  Filled 2022-04-26: qty 1

## 2022-04-26 MED ORDER — SODIUM CHLORIDE 0.9 % IV SOLN: 200.0000 mg | Freq: Once | INTRAVENOUS | Status: DC

## 2022-04-26 MED ORDER — SODIUM CHLORIDE 0.9 % IV SOLN
100.0000 mg | Freq: Every day | INTRAVENOUS | Status: AC
  Administered 2022-04-28 – 2022-04-29 (×2): 100 mg via INTRAVENOUS
  Filled 2022-04-26 (×2): qty 20

## 2022-04-26 MED ORDER — SODIUM CHLORIDE 0.9 % IV SOLN
100.0000 mg | INTRAVENOUS | Status: AC
  Administered 2022-04-26 – 2022-04-27 (×2): 100 mg via INTRAVENOUS

## 2022-04-26 MED ORDER — SODIUM CHLORIDE 0.9 % IV SOLN: 100.0000 mg | Freq: Every day | INTRAVENOUS | Status: DC

## 2022-04-26 NOTE — ED Provider Notes (Signed)
Lawrence Olson   CSN: [id number] Arrival date & time: 04/26/22  1951     History  Chief Complaint  Patient presents with   Weakness   Nausea    Lawrence Olson is a 71 y.o. male. with pmh coronary artery disease, chronic systolic heart failure 32-35%, VF arrest who presents with 3 days of generally feeling unwell.  He has not been eating because he has had no appetite and has had generalized abdominal cramps and nausea.  No vomiting or diarrhea.  He has also had associated low-grade fevers with shortness of breath on ambulation but no orthopnea or PND.  He denies any weight gain or leg swelling or pain but endorses weight loss since he has not been eating or drinking.  He denies any chest pain.  He just feels generalized fatigue and weakness in his feels too weak to walk around.   Weakness      Home Medications Prior to Admission medications   Medication Sig Start Date End Date Taking? Authorizing Provider  aspirin 81 MG chewable tablet Chew 1 tablet (81 mg total) by mouth daily. 05/05/19   Clegg, Amy D, NP  atorvastatin (LIPITOR) 80 MG tablet TAKE 1 TABLET(80 MG) BY MOUTH DAILY AT 6 PM 05/04/21   Bensimhon, Shaune Pascal, MD  B Complex-C (B-COMPLEX WITH VITAMIN C) tablet Take 1 tablet by mouth daily. 05/05/19   Clegg, Amy D, NP  blood glucose meter kit and supplies KIT Dispense based on patient and insurance preference. Use up to four times daily as directed. (FOR ICD-9 250.00, 250.01). 05/15/19   Love, Ivan Anchors, PA-C  carvedilol (COREG) 6.25 MG tablet TAKE 1 TABLET(6.25 MG) BY MOUTH TWICE DAILY WITH A MEAL 01/25/22   Bensimhon, Shaune Pascal, MD  empagliflozin ([name]) 10 MG TABS tablet Take 1 tablet (10 mg total) by mouth daily before breakfast. 04/19/22   Bensimhon, Shaune Pascal, MD  sacubitril-valsartan (ENTRESTO) 97-103 MG Take 1 tablet by mouth 2 (two) times daily. 07/31/21   Bensimhon, Shaune Pascal, MD  tamsulosin (FLOMAX) 0.4 MG CAPS capsule TAKE 1  CAPSULE(0.4 MG) BY MOUTH DAILY AFTER SUPPER 10/14/21   [name], NP      Allergies    Patient has no known allergies.    Review of Systems   Review of Systems  Neurological:  Positive for weakness.    Physical Exam Updated Vital Signs BP 115/82   Pulse 85   Temp 99.1 F (37.3 C) (Oral)   Resp (!) 22   Ht _0  (1.676 m)   Wt 87.5 kg   SpO2 96%   BMI 31.15 kg/m  Physical Exam Constitutional: Alert and oriented.  Fatigued and chronically ill in appearance but no acute distress, nontoxic Eyes: Conjunctivae are normal. ENT      Head: Normocephalic and atraumatic.      Nose: No congestion.      Mouth/Throat: Mucous membranes are dry.      Neck: No stridor. Cardiovascular: S1, S2,  Normal and symmetric distal pulses are present in all extremities.Warm and well perfused. Respiratory: Mild tachypnea, decreased breath sounds at the bases, O2 sat 88 on room air Gastrointestinal: Soft and mild diffuse ttp, no localization, no rebound or guarding Musculoskeletal: Normal range of motion in all extremities.      Right lower leg: No tenderness or edema.      Left lower leg: No tenderness or edema. Neurologic: Normal speech and language. No facial droop. Moving all extremities equally.  Sensation grossly intact. No gross focal neurologic deficits are appreciated. Skin: Skin is warm, dry and intact. No rash noted. Psychiatric: Mood and affect are normal. Speech and behavior are normal.  ED Results / Procedures / Treatments   Labs (all labs ordered are listed, but only abnormal results are displayed) Labs Reviewed  RESP PANEL BY RT-PCR (FLU A&B, COVID) ARPGX2 - Abnormal; Notable for the following components:      Result Value   SARS Coronavirus 2 by RT PCR POSITIVE (*)    All other components within normal limits  LIPASE, BLOOD - Abnormal; Notable for the following components:   Lipase 63 (*)    All other components within normal limits  COMPREHENSIVE METABOLIC PANEL -  Abnormal; Notable for the following components:   Glucose, Bld 127 (*)    BUN 30 (*)    Creatinine, Ser 1.67 (*)    Calcium 8.3 (*)    GFR, Estimated 43 (*)    All other components within normal limits  BRAIN NATRIURETIC PEPTIDE - Abnormal; Notable for the following components:   B Natriuretic Peptide 343.9 (*)    All other components within normal limits  CBG MONITORING, ED - Abnormal; Notable for the following components:   Glucose-Capillary 122 (*)    All other components within normal limits  TROPONIN I (HIGH SENSITIVITY) - Abnormal; Notable for the following components:   Troponin I (High Sensitivity) 25 (*)    All other components within normal limits  TROPONIN I (HIGH SENSITIVITY) - Abnormal; Notable for the following components:   Troponin I (High Sensitivity) 23 (*)    All other components within normal limits  CBC WITH DIFFERENTIAL/PLATELET  URINALYSIS, ROUTINE W REFLEX MICROSCOPIC    EKG EKG Interpretation  Date/Time:  Monday April 26 2022 20:53:19 EDT Ventricular Rate:  83 PR Interval:  167 QRS Duration: 90 QT Interval:  370 QTC Calculation: 435 R Axis:   -24 Text Interpretation: Sinus rhythm Ventricular trigeminy Borderline left axis deviation Anterior infarct, old New PVCs Confirmed by Lawrence Olson [phone]) on 04/27/2022 12:00:10 AM  Radiology DG Chest Portable 1 View  Result Date: 04/26/2022 CLINICAL DATA:  Fever. EXAM: PORTABLE CHEST 1 VIEW COMPARISON:  08/16/2019 FINDINGS: Single lead left-sided pacemaker in place. The heart is upper normal in size. Atherosclerosis of the thoracic aorta. There is eventration of the right hemidiaphragm. Basilar assessment is limited due to portable technique and soft tissue attenuation from habitus. No definite focal airspace disease. No pulmonary edema, pneumothorax or large pleural effusion. Colonic interposition under the medial left hemidiaphragm. IMPRESSION: No evidence of focal airspace disease, allowing for limited  basilar assessment. Electronically Signed   By: Lawrence Olson M.D.   On: 04/26/2022 21:00    Procedures Procedures  Remain on constant cardiac monitoring, sinus rhythm with intermittent PVCs  Medications Ordered in ED Medications  sodium chloride 0.9 % bolus 500 mL (500 mLs Intravenous New Bag/Given 04/26/22 2249)  remdesivir 100 mg in sodium chloride 0.9 % 100 mL IVPB (0 mg Intravenous Stopped 04/26/22 2329)  remdesivir 100 mg in sodium chloride 0.9 % 100 mL IVPB (has no administration in time range)  dexamethasone (DECADRON) injection 6 mg (6 mg Intravenous Given 04/26/22 2253)  ondansetron (ZOFRAN) injection 4 mg (4 mg Intravenous Given 04/26/22 2252)    ED Course/ Medical Decision Making/ A&P                           Medical Decision Making  Jaxn Chiquito is a 71 y.o. male. with pmh coronary artery disease, chronic systolic heart failure 76-18%, VF arrest who presents with 3 days of generally feeling unwell.    Patient presented with symptoms suggestive of likely viral syndrome and tested positive for COVID which was consistent with his symptoms.  He had a chest x-ray obtained which showed no evidence of large pleural effusions, no pulmonary edema or no definite pneumonia although limited by elevated hemidiaphragms, personally reviewed and agree with radiology read.  He has been hypoxic to 88% on room air likely secondary to [place] and started on 2 L nasal cannula.  He was given COVID therapies with IV remdesivir and IV Decadron 6 milligrams.  His creatinine was elevated 1.67 with elevated BUN likely prerenal in nature due to decreased p.o. intake.  Despite his slightly elevated BNP, he does not appear clinically fluid overloaded and has been losing weight so we will give gentle fluids with 500 cc over 2 hours.  His EKG had new trigeminy rhythm with elevated troponin 25 and repeat down trended to 23.  Suspect demand ischemia in the setting of COVID and hypoxia, no symptoms suggestive of atypical  ACS.  His case was discussed with hospitalist who will put in orders for admission for continued monitoring and COVID therapy.  Amount and/or Complexity of Data Reviewed Labs: ordered. Decision-making details documented in ED Course. Radiology: ordered and independent interpretation performed. Decision-making details documented in ED Course. ECG/medicine tests: independent interpretation performed. Decision-making details documented in ED Course.  Risk Prescription drug management. Decision regarding hospitalization.    Final Clinical Impression(s) / ED Diagnoses Final diagnoses:  COVID-19  AKI (acute kidney injury) ([place])  Acute respiratory failure with hypoxia [facility])    Rx / DC Orders ED Discharge Orders     None         Elgie Congo, MD 04/27/22 0005

## 2022-04-26 NOTE — ED Triage Notes (Signed)
Patient arrived via POV c/o generalized weakness w/ nausea and urinary frequency x 3 days. Patient states dizziness, nausea, cough as well. Patient is AO x 4, VS w/ low grade fever, unable to walk at this time.

## 2022-04-26 NOTE — Progress Notes (Addendum)
Patient states he is unable to ambulate at this time.  Patient SPO2 decreased to 88% when he began talking.

## 2022-04-26 NOTE — Plan of Care (Signed)
[facility] will assume care on arrival to accepting facility. Until arrival, care as per EDP. However, [facility] available 24/7 for questions and assistance.  Nursing staff, please page [facility] Admits and Consults ([phone]) as soon as the patient arrives to the hospital.   

## 2022-04-26 NOTE — ED Notes (Signed)
Portable XR at bedside

## 2022-04-27 ENCOUNTER — Encounter (HOSPITAL_COMMUNITY): Payer: Self-pay | Admitting: Internal Medicine

## 2022-04-27 DIAGNOSIS — N179 Acute kidney failure, unspecified: Secondary | ICD-10-CM | POA: Diagnosis present

## 2022-04-27 DIAGNOSIS — N400 Enlarged prostate without lower urinary tract symptoms: Secondary | ICD-10-CM | POA: Diagnosis present

## 2022-04-27 DIAGNOSIS — U071 COVID-19: Secondary | ICD-10-CM | POA: Diagnosis present

## 2022-04-27 DIAGNOSIS — E785 Hyperlipidemia, unspecified: Secondary | ICD-10-CM | POA: Diagnosis present

## 2022-04-27 DIAGNOSIS — Z7982 Long term (current) use of aspirin: Secondary | ICD-10-CM | POA: Diagnosis not present

## 2022-04-27 DIAGNOSIS — Z87891 Personal history of nicotine dependence: Secondary | ICD-10-CM | POA: Diagnosis not present

## 2022-04-27 DIAGNOSIS — I252 Old myocardial infarction: Secondary | ICD-10-CM | POA: Diagnosis not present

## 2022-04-27 DIAGNOSIS — E669 Obesity, unspecified: Secondary | ICD-10-CM | POA: Diagnosis present

## 2022-04-27 DIAGNOSIS — J1282 Pneumonia due to coronavirus disease 2019: Secondary | ICD-10-CM | POA: Diagnosis present

## 2022-04-27 DIAGNOSIS — Z7984 Long term (current) use of oral hypoglycemic drugs: Secondary | ICD-10-CM | POA: Diagnosis not present

## 2022-04-27 DIAGNOSIS — J9601 Acute respiratory failure with hypoxia: Secondary | ICD-10-CM | POA: Diagnosis present

## 2022-04-27 DIAGNOSIS — Z6831 Body mass index (BMI) 31.0-31.9, adult: Secondary | ICD-10-CM | POA: Diagnosis not present

## 2022-04-27 DIAGNOSIS — I5042 Chronic combined systolic (congestive) and diastolic (congestive) heart failure: Secondary | ICD-10-CM | POA: Diagnosis present

## 2022-04-27 DIAGNOSIS — E119 Type 2 diabetes mellitus without complications: Secondary | ICD-10-CM | POA: Diagnosis present

## 2022-04-27 DIAGNOSIS — Z95810 Presence of automatic (implantable) cardiac defibrillator: Secondary | ICD-10-CM | POA: Diagnosis not present

## 2022-04-27 DIAGNOSIS — Z79899 Other long term (current) drug therapy: Secondary | ICD-10-CM | POA: Diagnosis not present

## 2022-04-27 DIAGNOSIS — I2510 Atherosclerotic heart disease of native coronary artery without angina pectoris: Secondary | ICD-10-CM | POA: Diagnosis present

## 2022-04-27 LAB — URINALYSIS, MICROSCOPIC (REFLEX): WBC, UA: NONE SEEN WBC/hpf (ref 0–5)

## 2022-04-27 LAB — URINALYSIS, ROUTINE W REFLEX MICROSCOPIC
Bilirubin Urine: NEGATIVE
Glucose, UA: >=500 mg/dL — AB
Ketones, ur: 40 mg/dL — AB
Leukocytes,Ua: NEGATIVE
Nitrite: NEGATIVE
Protein, ur: 100 mg/dL — AB
Specific Gravity, Urine: >=1.03 (ref 1.005–1.030)
pH: 5.5 (ref 5.0–8.0)

## 2022-04-27 LAB — HEMOGLOBIN A1C
Hgb A1c MFr Bld: 6.5 % — ABNORMAL HIGH (ref 4.8–5.6)
Mean Plasma Glucose: 139.85 mg/dL

## 2022-04-27 LAB — GLUCOSE, CAPILLARY: Glucose-Capillary: 112 mg/dL — ABNORMAL HIGH (ref 70–99)

## 2022-04-27 LAB — CBG MONITORING, ED: Glucose-Capillary: 127 mg/dL — ABNORMAL HIGH (ref 70–99)

## 2022-04-27 MED ORDER — ENOXAPARIN SODIUM 40 MG/0.4ML IJ SOSY
40.0000 mg | PREFILLED_SYRINGE | INTRAMUSCULAR | Status: DC
  Administered 2022-04-27 – 2022-04-28 (×2): 40 mg via SUBCUTANEOUS
  Filled 2022-04-27 (×2): qty 0.4

## 2022-04-27 MED ORDER — ONDANSETRON HCL 4 MG/2ML IJ SOLN: 4.0000 mg | Freq: Four times a day (QID) | INTRAMUSCULAR | Status: DC | PRN

## 2022-04-27 MED ORDER — ATORVASTATIN CALCIUM 80 MG PO TABS
80.0000 mg | ORAL_TABLET | Freq: Every day | ORAL | Status: DC
  Administered 2022-04-28 – 2022-04-29 (×2): 80 mg via ORAL
  Filled 2022-04-27 (×2): qty 1

## 2022-04-27 MED ORDER — CARVEDILOL 6.25 MG PO TABS
6.2500 mg | ORAL_TABLET | Freq: Two times a day (BID) | ORAL | Status: DC
  Administered 2022-04-28 – 2022-04-29 (×3): 6.25 mg via ORAL
  Filled 2022-04-27 (×3): qty 1

## 2022-04-27 MED ORDER — ASPIRIN 81 MG PO CHEW
81.0000 mg | CHEWABLE_TABLET | Freq: Every day | ORAL | Status: DC
  Administered 2022-04-28 – 2022-04-29 (×2): 81 mg via ORAL
  Filled 2022-04-27 (×2): qty 1

## 2022-04-27 MED ORDER — METHYLPREDNISOLONE SODIUM SUCC 125 MG IJ SOLR
0.5000 mg/kg | Freq: Two times a day (BID) | INTRAMUSCULAR | Status: DC
  Administered 2022-04-27 – 2022-04-29 (×4): 43.75 mg via INTRAVENOUS
  Filled 2022-04-27 (×4): qty 2

## 2022-04-27 MED ORDER — TAMSULOSIN HCL 0.4 MG PO CAPS
0.4000 mg | ORAL_CAPSULE | Freq: Every day | ORAL | Status: DC
  Administered 2022-04-27 – 2022-04-28 (×2): 0.4 mg via ORAL
  Filled 2022-04-27 (×2): qty 1

## 2022-04-27 MED ORDER — PREDNISONE 5 MG PO TABS: 50.0000 mg | ORAL_TABLET | Freq: Every day | ORAL | Status: DC

## 2022-04-27 MED ORDER — ONDANSETRON HCL 4 MG PO TABS: 4.0000 mg | ORAL_TABLET | Freq: Four times a day (QID) | ORAL | Status: DC | PRN

## 2022-04-27 MED ORDER — SODIUM CHLORIDE 0.9% FLUSH
3.0000 mL | Freq: Two times a day (BID) | INTRAVENOUS | Status: DC
  Administered 2022-04-27 – 2022-04-28 (×3): 3 mL via INTRAVENOUS

## 2022-04-27 MED ORDER — SODIUM CHLORIDE 0.9 % IV SOLN: INTRAVENOUS | Status: DC | PRN

## 2022-04-27 MED ORDER — ALBUTEROL SULFATE HFA 108 (90 BASE) MCG/ACT IN AERS
2.0000 | INHALATION_SPRAY | RESPIRATORY_TRACT | Status: DC | PRN
Start: 2022-04-27 — End: 2022-04-29

## 2022-04-27 MED ORDER — SODIUM CHLORIDE 0.9% FLUSH: 3.0000 mL | INTRAVENOUS | Status: DC | PRN

## 2022-04-27 MED ORDER — SACUBITRIL-VALSARTAN 97-103 MG PO TABS
1.0000 | ORAL_TABLET | Freq: Two times a day (BID) | ORAL | Status: DC
  Administered 2022-04-28 – 2022-04-29 (×3): 1 via ORAL
  Filled 2022-04-27 (×4): qty 1

## 2022-04-27 MED ORDER — DEXAMETHASONE SODIUM PHOSPHATE 10 MG/ML IJ SOLN
6.0000 mg | Freq: Once | INTRAMUSCULAR | Status: AC
  Administered 2022-04-27: 6 mg via INTRAVENOUS
  Filled 2022-04-27: qty 1

## 2022-04-27 MED ORDER — INSULIN ASPART 100 UNIT/ML IJ SOLN
0.0000 [IU] | Freq: Three times a day (TID) | INTRAMUSCULAR | Status: DC
  Administered 2022-04-28: 3 [IU] via SUBCUTANEOUS
  Administered 2022-04-28: 5 [IU] via SUBCUTANEOUS
  Administered 2022-04-28 – 2022-04-29 (×2): 3 [IU] via SUBCUTANEOUS
  Administered 2022-04-29: 8 [IU] via SUBCUTANEOUS

## 2022-04-27 MED ORDER — HYDROCOD POLI-CHLORPHE POLI ER 10-8 MG/5ML PO SUER: 5.0000 mL | Freq: Two times a day (BID) | ORAL | Status: DC | PRN

## 2022-04-27 MED ORDER — ZINC SULFATE 220 (50 ZN) MG PO CAPS
220.0000 mg | ORAL_CAPSULE | Freq: Every day | ORAL | Status: DC
  Administered 2022-04-27 – 2022-04-29 (×3): 220 mg via ORAL
  Filled 2022-04-27 (×3): qty 1

## 2022-04-27 MED ORDER — SODIUM CHLORIDE 0.9% FLUSH
3.0000 mL | Freq: Two times a day (BID) | INTRAVENOUS | Status: DC
  Administered 2022-04-27 – 2022-04-29 (×4): 3 mL via INTRAVENOUS

## 2022-04-27 MED ORDER — OXYCODONE HCL 5 MG PO TABS: 5.0000 mg | ORAL_TABLET | ORAL | Status: DC | PRN

## 2022-04-27 MED ORDER — ACETAMINOPHEN 325 MG PO TABS: 650.0000 mg | ORAL_TABLET | Freq: Four times a day (QID) | ORAL | Status: DC | PRN

## 2022-04-27 MED ORDER — VITAMIN C 500 MG PO TABS
500.0000 mg | ORAL_TABLET | Freq: Every day | ORAL | Status: DC
  Administered 2022-04-27 – 2022-04-29 (×3): 500 mg via ORAL
  Filled 2022-04-27 (×3): qty 1

## 2022-04-27 MED ORDER — GUAIFENESIN-DM 100-10 MG/5ML PO SYRP: 10.0000 mL | ORAL_SOLUTION | ORAL | Status: DC | PRN

## 2022-04-27 NOTE — H&P (Signed)
History and Physical    PatientToa Mia Olson:[id number] DOB: November 29, 1950 DOA: 04/26/2022 DOS: the patient was seen and examined on 04/27/2022 PCP: [name], NP  Patient coming from: Home - lives with wife; NOK: Daughter, [name], [phone]   Chief Complaint: Weakness and nausea  HPI: Lawrence Olson is a 71 y.o. male with medical history significant of chronic systolic CHF; cardiac arrest; CAD; and DM presenting with weakness and nausea.  He reports that he started with fatigue and weakness about 4-5 days ago.  He has had diarrhea and n/v although none since yesterday.  Mild SOB, mild cough.  His wife is also sick.    ER Course:  [facility] to [facility] transfer, per Dr. Marlowe Sax:  71 year old male with history of CHF EF 35%, V-fib arrest presenting with 3-day history of COVID symptoms.  Testing positive and hypoxic to 88% on room air.  Placed on 2 L supplemental oxygen.  Labs showing mild AKI.  Chest x-ray not suggestive of pneumonia.  He was given remdesivir, Decadron, and IV fluids.     Review of Systems: As mentioned in the history of present illness. All other systems reviewed and are negative. Past Medical History:  Diagnosis Date   Acute ST elevation myocardial infarction (STEMI) involving left anterior descending (LAD) coronary artery (HCC) 04/16/2019   Anoxic brain injury ([place])    Cardiac arrest ([place]) 04/16/2019   Chronic combined systolic and diastolic congestive heart failure (HCC)    Controlled type 2 diabetes mellitus with hyperglycemia ([place])    Coronary artery disease involving native coronary artery of native heart without angina pectoris    Jaundice    needed hospitalization in [place]   VT (ventricular tachycardia) [facility])    Past Surgical History:  Procedure Laterality Date   ICD IMPLANT N/A 08/16/2019   Procedure: ICD IMPLANT;  Surgeon: Constance Haw, MD;  Location: [place] [facility];  Service: Cardiovascular;  Laterality: N/A;   RIGHT HEART CATH AND  CORONARY ANGIOGRAPHY N/A 04/16/2019   Procedure: RIGHT HEART CATH AND CORONARY ANGIOGRAPHY;  Surgeon: Troy Sine, MD;  Location: [place] [facility];  Service: Cardiovascular;  Laterality: N/A;   VENTRICULAR ASSIST DEVICE INSERTION N/A 04/16/2019   Procedure: VENTRICULAR ASSIST DEVICE INSERTION;  Surgeon: Troy Sine, MD;  Location: [place] [facility];  Service: Cardiovascular;  Laterality: N/A;   Social History:  reports that he has quit smoking. His smoking use included cigarettes. He has never used smokeless tobacco. He reports current alcohol use. He reports that he does not use drugs.  No Known Allergies  Family History  Problem Relation Age of Onset   Healthy Brother    Healthy Sister     Prior to Admission medications   Medication Sig Start Date End Date Taking? Authorizing Provider  aspirin 81 MG chewable tablet Chew 1 tablet (81 mg total) by mouth daily. 05/05/19   Clegg, Amy D, NP  atorvastatin (LIPITOR) 80 MG tablet TAKE 1 TABLET(80 MG) BY MOUTH DAILY AT 6 PM 05/04/21   Bensimhon, Shaune Pascal, MD  B Complex-C (B-COMPLEX WITH VITAMIN C) tablet Take 1 tablet by mouth daily. 05/05/19   Clegg, Amy D, NP  blood glucose meter kit and supplies KIT Dispense based on patient and insurance preference. Use up to four times daily as directed. (FOR ICD-9 250.00, 250.01). 05/15/19   Love, Ivan Anchors, PA-C  carvedilol (COREG) 6.25 MG tablet TAKE 1 TABLET(6.25 MG) BY MOUTH TWICE DAILY WITH A MEAL 01/25/22   Bensimhon,  Shaune Pascal, MD  empagliflozin ([name]) 10 MG TABS tablet Take 1 tablet (10 mg total) by mouth daily before breakfast. 04/19/22   Bensimhon, Shaune Pascal, MD  sacubitril-valsartan (ENTRESTO) 97-103 MG Take 1 tablet by mouth 2 (two) times daily. 07/31/21   Bensimhon, Shaune Pascal, MD  tamsulosin (FLOMAX) 0.4 MG CAPS capsule TAKE 1 CAPSULE(0.4 MG) BY MOUTH DAILY AFTER SUPPER 10/14/21   [name], NP    Physical Exam: Vitals:   04/27/22 1300 04/27/22 1430 04/27/22 1500 04/27/22 1631  BP:   125/75 123/73 135/72  Pulse:  (!) 49 (!) 50 (!) 53  Resp:  _0 Temp: 97.7 F (36.5 C)  97.7 F (36.5 C) 97.9 F (36.6 C)  TempSrc: Oral   Oral  SpO2:  99% 98% 99%  Weight:      Height:       General:  Appears calm and comfortable and is in NAD; on 2L [place] O2 but this was turned off while I was in the room with O2 sats remaining >97% Eyes:  EOMI, normal lids, iris ENT:  grossly normal hearing, lips & tongue, mmm; appropriate dentition Neck:  no LAD, masses or thyromegaly Cardiovascular:  RRR, no m/r/g. No LE edema.  Respiratory:   CTA bilaterally with no wheezes/rales/rhonchi.  Normal respiratory effort. Abdomen:  soft, NT, ND Skin:  no rash or induration seen on limited exam Musculoskeletal:  grossly normal tone BUE/BLE, good ROM, no bony abnormality Psychiatric:  grossly normal mood and affect, speech fluent and appropriate, AOx3 Neurologic:  CN 2-12 grossly intact, moves all extremities in coordinated fashion   Radiological Exams on Admission: Independently reviewed - see discussion in A/P where applicable  DG Chest Portable 1 View  Result Date: 04/26/2022 CLINICAL DATA:  Fever. EXAM: PORTABLE CHEST 1 VIEW COMPARISON:  08/16/2019 FINDINGS: Single lead left-sided pacemaker in place. The heart is upper normal in size. Atherosclerosis of the thoracic aorta. There is eventration of the right hemidiaphragm. Basilar assessment is limited due to portable technique and soft tissue attenuation from habitus. No definite focal airspace disease. No pulmonary edema, pneumothorax or large pleural effusion. Colonic interposition under the medial left hemidiaphragm. IMPRESSION: No evidence of focal airspace disease, allowing for limited basilar assessment. Electronically Signed   By: Keith Rake M.D.   On: 04/26/2022 21:00    EKG: Independently reviewed.  NSR with rate 55; no evidence of acute ischemia   Labs on Admission: I have personally reviewed the available labs and imaging studies  at the time of the admission.  Pertinent labs:    Glucose 127 BUN 30/Creatinine 1.67/GFR 43; 27/1.18/>60 in 06/2021 HS troponin 25, 23 BNP 343.9 Normal CBC COVID positive UA: >500 glucose, trace Hgb, 40 ketones, 100 protein   Assessment and Plan: Principal Problem:   Acute hypoxemic respiratory failure due to COVID-19 [facility]) Active Problems:   AKI (acute kidney injury) ([place])   Coronary artery disease involving native coronary artery of native heart without angina pectoris   Controlled type 2 diabetes mellitus with hyperglycemia, without long-term current use of insulin (HCC)   Dyslipidemia   BPH (benign prostatic hyperplasia)    Acute respiratory failure with hypoxia due to COVID-19 PNA -Patient with presenting with fatigue, SOB and GI symptoms -He does not have a usual home O2 requirement and is currently requiring 2L [place] O2 -COVID POSITIVE -The patient has comorbidities which may increase the risk for ARDS/MODS including: age, HTN, DM, CAD -CXR unremarkable -Will admit for further evaluation, close monitoring,  and treatment -Monitor on telemetry x at least 24 hours -At this time, will attempt to avoid use of aerosolized medications and use HFAs instead -Will check COVID labs in AM -Will order steroids and Remdesivir (pharmacy consult) given +COVID test and hypoxia <94% on room air -If the patient shows clinical deterioration, consider transfer to ICU with PCCM consultation -Will attempt to maintain euvolemia to a net negative fluid status -PT/OT consults -Encourage mobilization/ambulation as much as possible -Patient was seen wearing full PPE including: gown, gloves, N95; donning and doffing was in compliance with current standards.  CAD, 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 -Dramatic issue in 03/2019 with eventual LVAD placement -Had ICD placed in 07/2019 -Most recent echo was in 10/2020 with EF 40-45% -Continue ASA, carvedilol, Entresto (resume in AM) -Hold  [name] for now in the setting of AKI -He appears to no longer be taking spironolactone  AKI -Likely from acute illness including n/v/d -Appears euvolemic currently -Will hold diuretics and also IVF and recheck BMP in AM  DM -Will check A1c -hold [name] -Cover with moderate-scale SSI  HLD -Continue atorvastatin  BPH -Continue tamsulosin   Advance Care Planning:   Code Status: Full Code   Consults: PT/OT; RT  DVT Prophylaxis: Lovenox  Family Communication: None present; I left a message for his daughter by telephone at the time of admission  Severity of Illness: The appropriate patient status for this patient is INPATIENT. Inpatient status is judged to be reasonable and necessary in order to provide the required intensity of service to ensure the patient's safety. The patient's presenting symptoms, physical exam findings, and initial radiographic and laboratory data in the context of their chronic comorbidities is felt to place them at high risk for further clinical deterioration. Furthermore, it is not anticipated that the patient will be medically stable for discharge from the hospital within 2 midnights of admission.   * I certify that at the point of admission it is my clinical judgment that the patient will require inpatient hospital care spanning beyond 2 midnights from the point of admission due to high intensity of service, high risk for further deterioration and high frequency of surveillance required.*  Author: Karmen Bongo, MD 04/27/2022 6:20 PM  For on call review [email or web address].

## 2022-04-27 NOTE — ED Notes (Signed)
Report given to [facility]link 

## 2022-04-27 NOTE — ED Notes (Signed)
Updated pt's daughter Cecelia Byars re: room availability at [facility]. Verbal consent to transfer provided by daughter. Pt has some language barrier.

## 2022-04-27 NOTE — ED Notes (Signed)
Pt requested for RN to speak with his daughter. RN called daughter Cecelia Byars and informed daughter of plan and bed status. Daughter verbalized understanding.

## 2022-04-27 NOTE — ED Provider Notes (Signed)
  Physical Exam  BP 132/73   Pulse (!) 53   Temp 97.8 F (36.6 C) (Oral)   Resp 18   Ht '5\' 6"'$  (1.676 m)   Wt 87.5 kg   SpO2 98%   BMI 31.15 kg/m   Physical Exam  Procedures  Procedures  ED Course / MDM    Medical Decision Making Amount and/or Complexity of Data Reviewed Labs: ordered. Radiology: ordered.  Risk Prescription drug management. Decision regarding hospitalization.   Received care of pt from previous providers. Awaiting bed with COVID+ gen weakness. Sinus bradycardia, had episode of brief nonconducted pw with return to sinus rhythm.  Discussed with Cardiology, can decrease home carvedilol dose and continue to monitor.        Gareth Morgan, MD 04/28/22 [phone]

## 2022-04-28 DIAGNOSIS — U071 COVID-19: Secondary | ICD-10-CM | POA: Diagnosis not present

## 2022-04-28 DIAGNOSIS — N179 Acute kidney failure, unspecified: Secondary | ICD-10-CM | POA: Diagnosis not present

## 2022-04-28 DIAGNOSIS — J9601 Acute respiratory failure with hypoxia: Secondary | ICD-10-CM | POA: Diagnosis not present

## 2022-04-28 LAB — COMPREHENSIVE METABOLIC PANEL
ALT: 30 U/L (ref 0–44)
AST: 30 U/L (ref 15–41)
Albumin: 3.3 g/dL — ABNORMAL LOW (ref 3.5–5.0)
Alkaline Phosphatase: 43 U/L (ref 38–126)
Anion gap: 9 (ref 5–15)
BUN: 37 mg/dL — ABNORMAL HIGH (ref 8–23)
CO2: 24 mmol/L (ref 22–32)
Calcium: 9 mg/dL (ref 8.9–10.3)
Chloride: 109 mmol/L (ref 98–111)
Creatinine, Ser: 1.33 mg/dL — ABNORMAL HIGH (ref 0.61–1.24)
GFR, Estimated: 57 mL/min — ABNORMAL LOW (ref >60)
Glucose, Bld: 205 mg/dL — ABNORMAL HIGH (ref 70–99)
Potassium: 4.2 mmol/L (ref 3.5–5.1)
Sodium: 142 mmol/L (ref 135–145)
Total Bilirubin: 0.5 mg/dL (ref 0.3–1.2)
Total Protein: 7.3 g/dL (ref 6.5–8.1)

## 2022-04-28 LAB — CBC WITH DIFFERENTIAL/PLATELET
Abs Immature Granulocytes: 0.02 10*3/uL (ref 0.00–0.07)
Basophils Absolute: 0 10*3/uL (ref 0.0–0.1)
Basophils Relative: 0 %
Eosinophils Absolute: 0 10*3/uL (ref 0.0–0.5)
Eosinophils Relative: 0 %
HCT: 48.3 % (ref 39.0–52.0)
Hemoglobin: 16.4 g/dL (ref 13.0–17.0)
Immature Granulocytes: 0 %
Lymphocytes Relative: 11 %
Lymphs Abs: 0.7 10*3/uL (ref 0.7–4.0)
MCH: 32 pg (ref 26.0–34.0)
MCHC: 34 g/dL (ref 30.0–36.0)
MCV: 94.3 fL (ref 80.0–100.0)
Monocytes Absolute: 0.2 10*3/uL (ref 0.1–1.0)
Monocytes Relative: 4 %
Neutro Abs: 5 10*3/uL (ref 1.7–7.7)
Neutrophils Relative %: 85 %
Platelets: 199 10*3/uL (ref 150–400)
RBC: 5.12 MIL/uL (ref 4.22–5.81)
RDW: 12.9 % (ref 11.5–15.5)
WBC: 6 10*3/uL (ref 4.0–10.5)
nRBC: 0 % (ref 0.0–0.2)

## 2022-04-28 LAB — D-DIMER, QUANTITATIVE: D-Dimer, Quant: 0.77 ug/mL-FEU — ABNORMAL HIGH (ref 0.00–0.50)

## 2022-04-28 LAB — GLUCOSE, CAPILLARY
Glucose-Capillary: 162 mg/dL — ABNORMAL HIGH (ref 70–99)
Glucose-Capillary: 176 mg/dL — ABNORMAL HIGH (ref 70–99)
Glucose-Capillary: 236 mg/dL — ABNORMAL HIGH (ref 70–99)
Glucose-Capillary: 165 mg/dL — ABNORMAL HIGH (ref 70–99)

## 2022-04-28 LAB — LACTATE DEHYDROGENASE: LDH: 184 U/L (ref 98–192)

## 2022-04-28 LAB — C-REACTIVE PROTEIN: CRP: 1.9 mg/dL — ABNORMAL HIGH (ref <1.0)

## 2022-04-28 LAB — FERRITIN: Ferritin: 912 ng/mL — ABNORMAL HIGH (ref 24–336)

## 2022-04-28 LAB — FIBRINOGEN: Fibrinogen: 471 mg/dL (ref 210–475)

## 2022-04-28 LAB — PROCALCITONIN: Procalcitonin: <0.1 ng/mL

## 2022-04-28 NOTE — Progress Notes (Signed)
PROGRESS NOTE    Lawrence Olson  QMG:[id number] DOB: 1951-08-12 DOA: 04/26/2022 PCP: [name], NP   Chief Complaint  Patient presents with   Weakness   Nausea    Brief Narrative:    HPI: Lawrence Olson is a 71 y.o. male with medical history significant of chronic systolic CHF; cardiac arrest; CAD; and DM presenting with weakness and nausea.  He reports that he started with fatigue and weakness about 4-5 days ago.  He has had diarrhea and n/v although none since yesterday.  Mild SOB, mild cough.  His wife is also sick.       ER Course:  [facility], per Dr. Marlowe Sax:   71 year old male with history of CHF EF 35%, V-fib arrest presenting with 3-day history of COVID symptoms.  Testing positive and hypoxic to 88% on room air.  Placed on 2 L supplemental oxygen.  Labs showing mild AKI.  Chest x-ray not suggestive of pneumonia.  He was given remdesivir, Decadron, and IV fluids.    Assessment & Plan:   Principal Problem:   Acute hypoxemic respiratory failure due to COVID-19 [facility]) Active Problems:   AKI (acute kidney injury) ([place])   Coronary artery disease involving native coronary artery of native heart without angina pectoris   Controlled type 2 diabetes mellitus with hyperglycemia, without long-term current use of insulin (HCC)   Dyslipidemia   BPH (benign prostatic hyperplasia)      Acute respiratory failure with hypoxia due to COVID-19 PNA -Patient with presenting with fatigue, SOB and GI symptoms -He does not have a usual home O2 requirement and is currently requiring 2L [place] O2 -COVID POSITIVE -He is with minimal hypoxia this morning dipping 87% with room air, he is on 1 to 2 L oxygen intermittently -The patient has comorbidities which may increase the risk for ARDS/MODS including: age, HTN, DM, CAD -CXR unremarkable -Continue with IV remdesivir -Continue with IV Solu-Medrol -Was encouraged to use incentive spirometry and flutter valve -Continue to follow  inflammatory markers   CAD, 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 -Dramatic issue in 03/2019 with eventual LVAD placement -Had ICD placed in 07/2019 -Most recent echo was in 10/2020 with EF 40-45% -Continue ASA, carvedilol, Entresto (resume in AM) -Hold [name] for now in the setting of AKI -He appears to no longer be taking spironolactone   AKI -Likely from acute illness including n/v/d -Appears euvolemic currently -Will hold diuretics and also IVF and recheck BMP in AM -Creatinine improving 1.6> 1.3   DM -Will check A1c -hold [name] -Cover with moderate-scale SSI   HLD -Continue atorvastatin   BPH -Continue tamsulosin      DVT prophylaxis: Lovenox Code Status: Full Family Communication: D/W daughter by phone Disposition:   Status is: Inpatient    Consultants:  none   Subjective: Report cough, congestion, mild dyspnea Objective: Vitals:   04/28/22 0000 04/28/22 0400 04/28/22 0750 04/28/22 1200  BP: 112/67 102/72 101/64 94/63  Pulse: (!) 59 (!) 47 (!) 55 (!) 53  Resp: '14 11 15 15  '$ Temp: 97.8 F (36.6 C) 98 F (36.7 C) 97.8 F (36.6 C) (!) 97.4 F (36.3 C)  TempSrc: Oral Oral Oral Oral  SpO2: 96% 96% 97% 95%  Weight:      Height:        Intake/Output Summary (Last 24 hours) at 04/28/2022 1546 Last data filed at 04/28/2022 0755 Gross per 24 hour  Intake --  Output 875 ml  Net -875 ml   Filed  Weights   04/26/22 2015  Weight: 87.5 kg    Examination:  General exam: Appears calm and comfortable  Respiratory system: Clear to auscultation. Respiratory effort normal. Cardiovascular system: S1 & S2 heard, RRR. No JVD, murmurs, rubs, gallops or clicks. No pedal edema. Gastrointestinal system: Abdomen is nondistended, soft and nontender. No organomegaly or masses felt. Normal bowel sounds heard. Central nervous system: Alert and oriented. No focal neurological deficits. Extremities: Symmetric 5 x 5 power. Skin: No rashes, lesions or  ulcers Psychiatry: Judgement and insight appear normal. Mood & affect appropriate.     Data Reviewed: I have personally reviewed following labs and imaging studies  CBC: Recent Labs  Lab 04/26/22 2046 04/28/22 0305  WBC 9.7 6.0  NEUTROABS 7.2 5.0  HGB 15.2 16.4  HCT 43.9 48.3  MCV 93.6 94.3  PLT 204 564    Basic Metabolic Panel: Recent Labs  Lab 04/26/22 2046 04/28/22 0305  NA 136 142  K 3.9 4.2  CL 105 109  CO2 22 24  GLUCOSE 127* 205*  BUN 30* 37*  CREATININE 1.67* 1.33*  CALCIUM 8.3* 9.0    GFR: Estimated Creatinine Clearance: 52.8 mL/min (A) (by C-G formula based on SCr of 1.33 mg/dL (H)).  Liver Function Tests: Recent Labs  Lab 04/26/22 2046 04/28/22 0305  AST 28 30  ALT 29 30  ALKPHOS 44 43  BILITOT 0.9 0.5  PROT 7.8 7.3  ALBUMIN 3.8 3.3*    CBG: Recent Labs  Lab 04/26/22 2021 04/27/22 1420 04/27/22 1835 04/28/22 0753 04/28/22 1257  GLUCAP 122* 127* 112* 162* 176*     Recent Results (from the past 240 hour(s))  Resp Panel by RT-PCR (Flu A&B, Covid) Anterior Nasal Swab     Status: Abnormal   Collection Time: 04/26/22  8:47 PM   Specimen: Anterior Nasal Swab  Result Value Ref Range Status   SARS Coronavirus 2 by RT PCR POSITIVE (A) NEGATIVE Final    Comment: (NOTE) SARS-CoV-2 target nucleic acids are DETECTED.  The SARS-CoV-2 RNA is generally detectable in upper respiratory specimens during the acute phase of infection. Positive results are indicative of the presence of the identified virus, but do not rule out bacterial infection or co-infection with other pathogens not detected by the test. Clinical correlation with patient history and other diagnostic information is necessary to determine patient infection status. The expected result is Negative.  Fact Sheet for Patients: EntrepreneurPulse.com.au  Fact Sheet for Healthcare Providers: IncredibleEmployment.be  This test is not yet approved or  cleared by the [place] FDA and  has been authorized for detection and/or diagnosis of SARS-CoV-2 by FDA under an Emergency Use Authorization (EUA).  This EUA will remain in effect (meaning this test can be used) for the duration of  the COVID-19 declaration under Section 564(b)(1) of the A ct, 21 U.S.C. section 360bbb-3(b)(1), unless the authorization is terminated or revoked sooner.     Influenza A by PCR NEGATIVE NEGATIVE Final   Influenza B by PCR NEGATIVE NEGATIVE Final    Comment: (NOTE) The Xpert Xpress SARS-CoV-2/FLU/RSV plus assay is intended as an aid in the diagnosis of influenza from Nasopharyngeal swab specimens and should not be used as a sole basis for treatment. Nasal washings and aspirates are unacceptable for Xpert Xpress SARS-CoV-2/FLU/RSV testing.  Fact Sheet for Patients: EntrepreneurPulse.com.au  Fact Sheet for Healthcare Providers: IncredibleEmployment.be  This test is not yet approved or cleared by the [place] FDA and has been authorized for detection and/or diagnosis of SARS-CoV-2  by FDA under an Emergency Use Authorization (EUA). This EUA will remain in effect (meaning this test can be used) for the duration of the COVID-19 declaration under Section 564(b)(1) of the Act, 21 U.S.C. section 360bbb-3(b)(1), unless the authorization is terminated or revoked.  Performed at [facility], [address] [place], [place] [address]          Radiology Studies: DG Chest Portable 1 View  Result Date: 04/26/2022 CLINICAL DATA:  Fever. EXAM: PORTABLE CHEST 1 VIEW COMPARISON:  08/16/2019 FINDINGS: Single lead left-sided pacemaker in place. The heart is upper normal in size. Atherosclerosis of the thoracic aorta. There is eventration of the right hemidiaphragm. Basilar assessment is limited due to portable technique and soft tissue attenuation from habitus. No definite focal airspace disease. No pulmonary  edema, pneumothorax or large pleural effusion. Colonic interposition under the medial left hemidiaphragm. IMPRESSION: No evidence of focal airspace disease, allowing for limited basilar assessment. Electronically Signed   By: Keith Rake M.D.   On: 04/26/2022 21:00        Scheduled Meds:  vitamin C  500 mg Oral Daily   aspirin  81 mg Oral Daily   atorvastatin  80 mg Oral Daily   carvedilol  6.25 mg Oral BID   enoxaparin (LOVENOX) injection  40 mg Subcutaneous Q24H   insulin aspart  0-15 Units Subcutaneous TID WC   methylPREDNISolone (SOLU-MEDROL) injection  0.5 mg/kg Intravenous Q12H   Followed by   Derrill Memo ON 04/30/2022] predniSONE  50 mg Oral Daily   sacubitril-valsartan  1 tablet Oral BID   sodium chloride flush  3 mL Intravenous Q12H   sodium chloride flush  3 mL Intravenous Q12H   tamsulosin  0.4 mg Oral QPC supper   zinc sulfate  220 mg Oral Daily   Continuous Infusions:  sodium chloride     remdesivir 100 mg in sodium chloride 0.9 % 100 mL IVPB 100 mg (04/28/22 1113)     LOS: 1 day     Phillips Climes, MD [facility]   To contact the attending provider between 7A-7P or the covering provider during after hours 7P-7A, please log into the web site [email or web address] and access using universal  password for that web site. If you do not have the password, please call the hospital operator.  04/28/2022, 3:46 PM

## 2022-04-28 NOTE — Evaluation (Signed)
Physical Therapy Evaluation Patient Details Name: Lawrence Olson [name] [id number] DOB: [birth year] Today's Date: 04/28/2022  History of Present Illness  Pt is a 71 year old man admitted on 04/26/22 with 4-5 days of weakness, N/V/D. + COVID. PMH: CHF with EF of 35%, cardiac arrest, anoxic brain injury, CAD, DM.  Clinical Impression   Pt presents with impaired activity tolerance and min decreased strength vs baseline. Pt to benefit from acute PT to address deficits. Pt ambulated good room distance without AD and supervision level of assist, SPO2 maintained 89% and greater on RA but PT replaced 1LO2 at end of session and let RN know. PT to progress mobility as tolerated, and will continue to follow acutely.         Recommendations for follow up therapy are one component of a multi-disciplinary discharge planning process, led by the attending physician.  Recommendations may be updated based on patient status, additional functional criteria and insurance authorization.  Follow Up Recommendations No PT follow up      Assistance Recommended at Discharge PRN  Patient can return home with the following  A little help with walking and/or transfers;A little help with bathing/dressing/bathroom    Equipment Recommendations None recommended by PT  Recommendations for Other Services       Functional Status Assessment Patient has had a recent decline in their functional status and demonstrates the ability to make significant improvements in function in a reasonable and predictable amount of time.     Precautions / Restrictions Precautions Precautions: Fall Restrictions Weight Bearing Restrictions: No      Mobility  Bed Mobility Overal bed mobility: Needs Assistance Bed Mobility: Supine to Sit     Supine to sit: Min guard, HOB elevated     General bed mobility comments: increased time and effort    Transfers Overall transfer level: Needs assistance Equipment used: None Transfers: Sit  to/from Stand Sit to Stand: Supervision           General transfer comment: for safety    Ambulation/Gait Ambulation/Gait assistance: Supervision Gait Distance (Feet): 90 Feet Assistive device: None Gait Pattern/deviations: Step-through pattern, Decreased stride length Gait velocity: decr     General Gait Details: slowed but steady gait, no evidence of LOB during directional changes or navigation around objects. SpO63mn on RA 89%  Stairs            Wheelchair Mobility    Modified Rankin (Stroke Patients Only)       Balance Overall balance assessment: Mild deficits observed, not formally tested                                           Pertinent Vitals/Pain Pain Assessment Pain Assessment: Faces Faces Pain Scale: No hurt Pain Intervention(s): Monitored during session    Home Living Family/patient expects to be discharged to:: Private residence Living Arrangements: Spouse/significant other Available Help at Discharge: Family Type of Home: House Home Access: Stairs to enter   [employer] Steps: a few   Home Layout: Two level;Able to live on main level with bedroom/bathroom Home Equipment: None      Prior Function Prior Level of Function : Independent/Modified Independent                     Hand Dominance   Dominant Hand: Right    Extremity/Trunk Assessment   Upper Extremity Assessment  Upper Extremity Assessment: Defer to OT evaluation    Lower Extremity Assessment Lower Extremity Assessment: Overall WFL for tasks assessed    Cervical / Trunk Assessment Cervical / Trunk Assessment: Normal  Communication   Communication: Other (comment) ([place] is his primary language, but also speaks some english and declines interpreter)  Cognition Arousal/Alertness: Awake/alert Behavior During Therapy: WFL for tasks assessed/performed Overall Cognitive Status: Within Functional Limits for tasks assessed                                           General Comments      Exercises     Assessment/Plan    PT Assessment Patient needs continued PT services  PT Problem List Decreased mobility;Decreased activity tolerance;Decreased safety awareness       PT Treatment Interventions DME instruction;Therapeutic activities;Gait training;Therapeutic exercise;Patient/family education;Balance training;Stair training;Neuromuscular re-education;Functional mobility training    PT Goals (Current goals can be found in the Care Plan section)  Acute Rehab PT Goals Patient Stated Goal: home PT Goal Formulation: With patient Time For Goal Achievement: 05/12/22 Potential to Achieve Goals: Good    Frequency Min 3X/week     Co-evaluation               AM-PAC PT "6 Clicks" Mobility  Outcome Measure Help needed turning from your back to your side while in a flat bed without using bedrails?: A Little Help needed moving from lying on your back to sitting on the side of a flat bed without using bedrails?: A Little Help needed moving to and from a bed to a chair (including a wheelchair)?: A Little Help needed standing up from a chair using your arms (e.g., wheelchair or bedside chair)?: A Little Help needed to walk in hospital room?: A Little Help needed climbing 3-5 steps with a railing? : A Little 6 Click Score: 18    End of Session Equipment Utilized During Treatment: Oxygen Activity Tolerance: Patient tolerated treatment well Patient left: in chair;with call bell/phone within reach (chair alarm pad placed, no chair alarm box in room, RN notified) Nurse Communication: Mobility status PT Visit Diagnosis: Other abnormalities of gait and mobility (R26.89);Muscle weakness (generalized) (M62.81)    Time: 8676-7209 PT Time Calculation (min) (ACUTE ONLY): 18 min   Charges:   PT Evaluation $PT Eval Low Complexity: 1 Low          Elnor Renovato S, PT DPT Acute Rehabilitation Services Pager  [phone]  Office [phone]   Ruch E Ruffin Pyo 04/28/2022, 2:30 PM

## 2022-04-28 NOTE — Progress Notes (Signed)
  Transition of Care (TOC) Screening Note   Patient Details  Name: Lawrence Olson Date of Birth: 18-Jan-1951   Transition of Care [facility]) CM/SW Contact:    Cyndi Bender, RN Phone Number: 04/28/2022, 9:00 AM    Transition of Care Department [facility]) has reviewed patient and no TOC needs have been identified at this time. We will continue to monitor patient advancement through interdisciplinary progression rounds. If new patient transition needs arise, please place a TOC consult.

## 2022-04-28 NOTE — Evaluation (Signed)
Occupational Therapy Evaluation Patient Details Name: Lawrence Olson [name] [id number] DOB: 06-Jul-1951 Today's Date: 04/28/2022   History of Present Illness Pt is a 71 year old man admitted on 04/26/22 with 4-5 days of weakness, N/V/D. + COVID. PMH: CHF with EF of 35%, cardiac arrest, anoxic brain injury, CAD, DM.   Clinical Impression   Pt was functioning independently prior to admission. Presents with generalized weakness requiring up to supervision for ambulation without AD and self care. Pt on 1L 02 throughout session with Sp02> 92%. Will follow acutely, do not anticipate pt will need post acute OT.      Recommendations for follow up therapy are one component of a multi-disciplinary discharge planning process, led by the attending physician.  Recommendations may be updated based on patient status, additional functional criteria and insurance authorization.   Follow Up Recommendations  No OT follow up    Assistance Recommended at Discharge PRN  Patient can return home with the following A little help with bathing/dressing/bathroom;Assistance with cooking/housework;Assist for transportation    Functional Status Assessment  Patient has had a recent decline in their functional status and demonstrates the ability to make significant improvements in function in a reasonable and predictable amount of time.  Equipment Recommendations  None recommended by OT    Recommendations for Other Services       Precautions / Restrictions Precautions Precautions: Fall Restrictions Weight Bearing Restrictions: No      Mobility Bed Mobility Overal bed mobility: Needs Assistance Bed Mobility: Supine to Sit, Sit to Supine     Supine to sit: Supervision Sit to supine: Supervision   General bed mobility comments: increased time and effort, HOB nearly flat    Transfers Overall transfer level: Needs assistance Equipment used: None Transfers: Sit to/from Stand Sit to Stand: Supervision            General transfer comment: for safety      Balance Overall balance assessment: Mild deficits observed, not formally tested                                         ADL either performed or assessed with clinical judgement   ADL Overall ADL's : Needs assistance/impaired Eating/Feeding: Independent   Grooming: Wash/dry hands;Standing;Supervision/safety   Upper Body Bathing: Set up;Sitting   Lower Body Bathing: Supervison/ safety;Sit to/from stand   Upper Body Dressing : Set up;Sitting   Lower Body Dressing: Supervision/safety;Sit to/from stand   Toilet Transfer: Supervision/safety;Ambulation   Toileting- Clothing Manipulation and Hygiene: Supervision/safety;Sit to/from stand       Functional mobility during ADLs: Supervision/safety General ADL Comments: fatigues     Vision Ability to See in Adequate Light: 0 Adequate Patient Visual Report: No change from baseline       Perception     Praxis      Pertinent Vitals/Pain Pain Assessment Pain Assessment: No/denies pain     Hand Dominance Right   Extremity/Trunk Assessment Upper Extremity Assessment Upper Extremity Assessment: Overall WFL for tasks assessed   Lower Extremity Assessment Lower Extremity Assessment: Defer to PT evaluation   Cervical / Trunk Assessment Cervical / Trunk Assessment: Normal   Communication Communication Communication: Other (comment) (declined interpreter, understands [place], but [place] is native language)   Cognition Arousal/Alertness: Awake/alert Behavior During Therapy: WFL for tasks assessed/performed Overall Cognitive Status: Within Functional Limits for tasks assessed  General Comments       Exercises     Shoulder Instructions      Home Living Family/patient expects to be discharged to:: Private residence Living Arrangements: Spouse/significant other Available Help at Discharge:  Family Type of Home: House Home Access: Stairs to enter [employer] of Steps: a few   Home Layout: Two level;Able to live on main level with bedroom/bathroom     Bathroom Shower/Tub: Teacher, early years/pre: Standard     Home Equipment: None          Prior Functioning/Environment Prior Level of Function : Independent/Modified Independent                        OT Problem List: Decreased strength;Decreased activity tolerance      OT Treatment/Interventions: Self-care/ADL training;Energy conservation    OT Goals(Current goals can be found in the care plan section) Acute Rehab OT Goals OT Goal Formulation: With patient Time For Goal Achievement: 05/12/22 Potential to Achieve Goals: Good ADL Goals Pt Will Perform Grooming: with modified independence;standing (3 activities) Pt Will Perform Lower Body Bathing: with modified independence;sit to/from stand Pt Will Perform Lower Body Dressing: with modified independence;sit to/from stand Pt Will Transfer to Toilet: with modified independence;ambulating;regular height toilet Pt Will Perform Toileting - Clothing Manipulation and hygiene: with modified independence;sit to/from stand Additional ADL Goal #1: Pt will generalize energy conservation strategies in ADLs.  OT Frequency: Min 2X/week    Co-evaluation              AM-PAC OT "6 Clicks" Daily Activity     Outcome Measure Help from another person eating meals?: None Help from another person taking care of personal grooming?: A Little Help from another person toileting, which includes using toliet, bedpan, or urinal?: A Little Help from another person bathing (including washing, rinsing, drying)?: A Little Help from another person to put on and taking off regular upper body clothing?: None Help from another person to put on and taking off regular lower body clothing?: A Little 6 Click Score: 20   End of Session Equipment Utilized During  Treatment: Gait belt;Oxygen (1L)  Activity Tolerance: Patient tolerated treatment well Patient left: in bed;with call bell/phone within reach;with bed alarm set  OT Visit Diagnosis: Muscle weakness (generalized) (M62.81);Other (comment) (decreased activity tolerance)                Time: 9166-0600 OT Time Calculation (min): 13 min Charges:  OT General Charges $OT Visit: 1 Visit OT Evaluation $OT Eval Low Complexity: [place], OTR/L Acute Rehabilitation Services Office: [phone]  Malka So 04/28/2022, 3:37 PM

## 2022-04-29 DIAGNOSIS — N179 Acute kidney failure, unspecified: Secondary | ICD-10-CM | POA: Diagnosis not present

## 2022-04-29 DIAGNOSIS — J9601 Acute respiratory failure with hypoxia: Secondary | ICD-10-CM | POA: Diagnosis not present

## 2022-04-29 DIAGNOSIS — U071 COVID-19: Secondary | ICD-10-CM | POA: Diagnosis not present

## 2022-04-29 LAB — GLUCOSE, CAPILLARY
Glucose-Capillary: 171 mg/dL — ABNORMAL HIGH (ref 70–99)
Glucose-Capillary: 293 mg/dL — ABNORMAL HIGH (ref 70–99)

## 2022-04-29 MED ORDER — DEXAMETHASONE 6 MG PO TABS: 6.0000 mg | ORAL_TABLET | Freq: Every day | ORAL | 0 refills | Status: DC

## 2022-04-29 NOTE — Discharge Instructions (Signed)
Follow with Primary MD [name], NP in 7 days   Get CBC, CMP,  checked  by Primary MD next visit.    Activity: As tolerated with Full fall precautions use walker/cane & assistance as needed   Disposition Home    Diet: Heart Healthy  .  For Heart failure patients - Check your Weight same time everyday, if you gain over 2 pounds, or you develop in leg swelling, experience more shortness of breath or chest pain, call your Primary MD immediately. Follow Cardiac Low Salt Diet and 1.5 lit/day fluid restriction.   On your next visit with your primary care physician please Get Medicines reviewed and adjusted.   Please request your Prim.MD to go over all Hospital Tests and Procedure/Radiological results at the follow up, please get all Hospital records sent to your Prim MD by signing hospital release before you go home.   If you experience worsening of your admission symptoms, develop shortness of breath, life threatening emergency, suicidal or homicidal thoughts you must seek medical attention immediately by calling 911 or calling your MD immediately  if symptoms less severe.  You Must read complete instructions/literature along with all the possible adverse reactions/side effects for all the Medicines you take and that have been prescribed to you. Take any new Medicines after you have completely understood and accpet all the possible adverse reactions/side effects.   Do not drive, operating heavy machinery, perform activities at heights, swimming or participation in water activities or provide baby sitting services if your were admitted for syncope or siezures until you have seen by Primary MD or a Neurologist and advised to do so again.  Do not drive when taking Pain medications.    Do not take more than prescribed Pain, Sleep and Anxiety Medications  Special Instructions: If you have smoked or chewed Tobacco  in the last 2 yrs please stop smoking, stop any regular Alcohol  and or  any Recreational drug use.  Wear Seat belts while driving.   Please note  You were cared for by a hospitalist during your hospital stay. If you have any questions about your discharge medications or the care you received while you were in the hospital after you are discharged, you can call the unit and asked to speak with the hospitalist on call if the hospitalist that took care of you is not available. Once you are discharged, your primary care physician will handle any further medical issues. Please note that NO REFILLS for any discharge medications will be authorized once you are discharged, as it is imperative that you return to your primary care physician (or establish a relationship with a primary care physician if you do not have one) for your aftercare needs so that they can reassess your need for medications and monitor your lab values.

## 2022-04-29 NOTE — Progress Notes (Signed)
Pt ordered to discharge home. AVS reviewed with pt. Education provided as needed. Patient verbalized understanding. All questions answered.  

## 2022-04-29 NOTE — Plan of Care (Signed)
Problem: Education: Goal: Knowledge of General Education information will improve Description: Including pain rating scale, medication(s)/side effects and non-pharmacologic comfort measures 04/29/2022 1015 by Rosey Bath, RN Outcome: Adequate for Discharge 04/29/2022 1015 by Rosey Bath, RN Outcome: Adequate for Discharge 04/29/2022 1014 by Rosey Bath, RN Outcome: Adequate for Discharge   Problem: Health Behavior/Discharge Planning: Goal: Ability to manage health-related needs will improve 04/29/2022 1015 by Rosey Bath, RN Outcome: Adequate for Discharge 04/29/2022 1015 by Rosey Bath, RN Outcome: Adequate for Discharge 04/29/2022 1014 by Rosey Bath, RN Outcome: Adequate for Discharge   Problem: Clinical Measurements: Goal: Ability to maintain clinical measurements within normal limits will improve 04/29/2022 1015 by Rosey Bath, RN Outcome: Adequate for Discharge 04/29/2022 1015 by Rosey Bath, RN Outcome: Adequate for Discharge 04/29/2022 1014 by Rosey Bath, RN Outcome: Adequate for Discharge Goal: Will remain free from infection 04/29/2022 1015 by Rosey Bath, RN Outcome: Adequate for Discharge 04/29/2022 1015 by Rosey Bath, RN Outcome: Adequate for Discharge 04/29/2022 1014 by Rosey Bath, RN Outcome: Adequate for Discharge Goal: Diagnostic test results will improve 04/29/2022 1015 by Rosey Bath, RN Outcome: Adequate for Discharge 04/29/2022 1015 by Rosey Bath, RN Outcome: Adequate for Discharge 04/29/2022 1014 by Rosey Bath, RN Outcome: Adequate for Discharge Goal: Respiratory complications will improve 04/29/2022 1015 by Rosey Bath, RN Outcome: Adequate for Discharge 04/29/2022 1015 by Rosey Bath, RN Outcome: Adequate for Discharge 04/29/2022 1014 by Rosey Bath, RN Outcome: Adequate for Discharge Goal: Cardiovascular complication will be avoided 04/29/2022 1015 by Rosey Bath, RN Outcome:  Adequate for Discharge 04/29/2022 1015 by Rosey Bath, RN Outcome: Adequate for Discharge 04/29/2022 1014 by Rosey Bath, RN Outcome: Adequate for Discharge   Problem: Activity: Goal: Risk for activity intolerance will decrease 04/29/2022 1015 by Rosey Bath, RN Outcome: Adequate for Discharge 04/29/2022 1015 by Rosey Bath, RN Outcome: Adequate for Discharge 04/29/2022 1014 by Rosey Bath, RN Outcome: Adequate for Discharge   Problem: Nutrition: Goal: Adequate nutrition will be maintained 04/29/2022 1015 by Rosey Bath, RN Outcome: Adequate for Discharge 04/29/2022 1015 by Rosey Bath, RN Outcome: Adequate for Discharge 04/29/2022 1014 by Rosey Bath, RN Outcome: Adequate for Discharge   Problem: Coping: Goal: Level of anxiety will decrease 04/29/2022 1015 by Rosey Bath, RN Outcome: Adequate for Discharge 04/29/2022 1015 by Rosey Bath, RN Outcome: Adequate for Discharge 04/29/2022 1014 by Rosey Bath, RN Outcome: Adequate for Discharge   Problem: Elimination: Goal: Will not experience complications related to bowel motility 04/29/2022 1015 by Rosey Bath, RN Outcome: Adequate for Discharge 04/29/2022 1015 by Rosey Bath, RN Outcome: Adequate for Discharge 04/29/2022 1014 by Rosey Bath, RN Outcome: Adequate for Discharge Goal: Will not experience complications related to urinary retention 04/29/2022 1015 by Rosey Bath, RN Outcome: Adequate for Discharge 04/29/2022 1015 by Rosey Bath, RN Outcome: Adequate for Discharge 04/29/2022 1014 by Rosey Bath, RN Outcome: Adequate for Discharge   Problem: Pain Managment: Goal: General experience of comfort will improve 04/29/2022 1015 by Rosey Bath, RN Outcome: Adequate for Discharge 04/29/2022 1015 by Rosey Bath, RN Outcome: Adequate for Discharge 04/29/2022 1014 by Rosey Bath, RN Outcome: Adequate for Discharge   Problem: Safety: Goal: Ability to  remain free from injury will improve 04/29/2022 1015 by Rosey Bath, RN Outcome: Adequate for Discharge 04/29/2022 1015 by Rosey Bath, RN Outcome: Adequate for Discharge 04/29/2022 1014 by Rosey Bath, RN Outcome: Adequate for Discharge   Problem: Skin Integrity: Goal: Risk for impaired skin integrity will decrease 04/29/2022 1015 by Rosey Bath, RN Outcome: Adequate for Discharge 04/29/2022 1015 by  Rosey Bath, RN Outcome: Adequate for Discharge 04/29/2022 1014 by Rosey Bath, RN Outcome: Adequate for Discharge   Problem: Education: Goal: Knowledge of risk factors and measures for prevention of condition will improve 04/29/2022 1015 by Rosey Bath, RN Outcome: Adequate for Discharge 04/29/2022 1015 by Rosey Bath, RN Outcome: Adequate for Discharge 04/29/2022 1014 by Rosey Bath, RN Outcome: Adequate for Discharge   Problem: Coping: Goal: Psychosocial and spiritual needs will be supported 04/29/2022 1015 by Rosey Bath, RN Outcome: Adequate for Discharge 04/29/2022 1015 by Rosey Bath, RN Outcome: Adequate for Discharge 04/29/2022 1014 by Rosey Bath, RN Outcome: Adequate for Discharge   Problem: Respiratory: Goal: Will maintain a patent airway 04/29/2022 1015 by Rosey Bath, RN Outcome: Adequate for Discharge 04/29/2022 1015 by Rosey Bath, RN Outcome: Adequate for Discharge 04/29/2022 1014 by Rosey Bath, RN Outcome: Adequate for Discharge Goal: Complications related to the disease process, condition or treatment will be avoided or minimized 04/29/2022 1015 by Rosey Bath, RN Outcome: Adequate for Discharge 04/29/2022 1015 by Rosey Bath, RN Outcome: Adequate for Discharge 04/29/2022 1014 by Rosey Bath, RN Outcome: Adequate for Discharge   Problem: Education: Goal: Ability to describe self-care measures that may prevent or decrease complications (Diabetes Survival Skills Education) will improve 04/29/2022  1015 by Rosey Bath, RN Outcome: Adequate for Discharge 04/29/2022 1015 by Rosey Bath, RN Outcome: Adequate for Discharge 04/29/2022 1014 by Rosey Bath, RN Outcome: Adequate for Discharge Goal: Individualized Educational Video(s) 04/29/2022 1015 by Rosey Bath, RN Outcome: Adequate for Discharge 04/29/2022 1015 by Rosey Bath, RN Outcome: Adequate for Discharge 04/29/2022 1014 by Rosey Bath, RN Outcome: Adequate for Discharge   Problem: Coping: Goal: Ability to adjust to condition or change in health will improve 04/29/2022 1015 by Rosey Bath, RN Outcome: Adequate for Discharge 04/29/2022 1015 by Rosey Bath, RN Outcome: Adequate for Discharge 04/29/2022 1014 by Rosey Bath, RN Outcome: Adequate for Discharge   Problem: Fluid Volume: Goal: Ability to maintain a balanced intake and output will improve 04/29/2022 1015 by Rosey Bath, RN Outcome: Adequate for Discharge 04/29/2022 1015 by Rosey Bath, RN Outcome: Adequate for Discharge 04/29/2022 1014 by Rosey Bath, RN Outcome: Adequate for Discharge   Problem: Health Behavior/Discharge Planning: Goal: Ability to identify and utilize available resources and services will improve 04/29/2022 1015 by Rosey Bath, RN Outcome: Adequate for Discharge 04/29/2022 1015 by Rosey Bath, RN Outcome: Adequate for Discharge 04/29/2022 1014 by Rosey Bath, RN Outcome: Adequate for Discharge Goal: Ability to manage health-related needs will improve 04/29/2022 1015 by Rosey Bath, RN Outcome: Adequate for Discharge 04/29/2022 1015 by Rosey Bath, RN Outcome: Adequate for Discharge 04/29/2022 1014 by Rosey Bath, RN Outcome: Adequate for Discharge   Problem: Metabolic: Goal: Ability to maintain appropriate glucose levels will improve 04/29/2022 1015 by Rosey Bath, RN Outcome: Adequate for Discharge 04/29/2022 1015 by Rosey Bath, RN Outcome: Adequate for  Discharge 04/29/2022 1014 by Rosey Bath, RN Outcome: Progressing   Problem: Nutritional: Goal: Maintenance of adequate nutrition will improve 04/29/2022 1015 by Rosey Bath, RN Outcome: Adequate for Discharge 04/29/2022 1015 by Rosey Bath, RN Outcome: Adequate for Discharge 04/29/2022 1014 by Rosey Bath, RN Outcome: Progressing Goal: Progress toward achieving an optimal weight will improve 04/29/2022 1015 by Rosey Bath, RN Outcome: Adequate for Discharge 04/29/2022 1015 by Rosey Bath, RN Outcome: Adequate for Discharge 04/29/2022 1014 by Rosey Bath, RN Outcome: Progressing   Problem: Skin Integrity: Goal: Risk for impaired skin integrity will decrease 04/29/2022 1015 by Rosey Bath, RN Outcome: Adequate for Discharge 04/29/2022 1015 by  Rosey Bath, RN Outcome: Adequate for Discharge 04/29/2022 1014 by Rosey Bath, RN Outcome: Progressing   Problem: Tissue Perfusion: Goal: Adequacy of tissue perfusion will improve 04/29/2022 1015 by Rosey Bath, RN Outcome: Adequate for Discharge 04/29/2022 1015 by Rosey Bath, RN Outcome: Adequate for Discharge 04/29/2022 1014 by Rosey Bath, RN Outcome: Progressing

## 2022-04-29 NOTE — Discharge Summary (Addendum)
Physician Discharge Summary  Lawrence Olson ZWC:[id number] DOB: November 17, 1950 DOA: 04/26/2022  PCP: [name], NP  Admit date: 04/26/2022 Discharge date: 04/29/2022  Admitted From: Home Disposition:  Home  Recommendations for Outpatient Follow-up:  Follow up with PCP in 2 weeks     Discharge Condition:Stable CODE STATUS:FULL Diet recommendation: Heart Healthy  Brief/Interim Summary:  Lawrence Olson is a 71 y.o. male with medical history significant of chronic systolic CHF; cardiac arrest; CAD; and DM presenting with weakness and nausea.  He reports that he started with fatigue and weakness about 4-5 days ago.  He has had diarrhea and n/v although none since yesterday.  Mild SOB, mild cough.  His wife is also sick.  Work-up significant for COVID-19 infection  Discharge Diagnoses:  Principal Problem:   Acute hypoxemic respiratory failure due to COVID-19 [facility]) Active Problems:   AKI (acute kidney injury) ([place])   Coronary artery disease involving native coronary artery of native heart without angina pectoris   Controlled type 2 diabetes mellitus with hyperglycemia, without long-term current use of insulin (HCC)   Dyslipidemia   BPH (benign prostatic hyperplasia)  Acute respiratory failure with hypoxia due to COVID-19 PNA -Patient with presenting with fatigue, SOB and GI symptoms -He does not have a usual home O2 requirement , he did require 1 to 2 L oxygen during hospital stay, this has resolved, ambulated in the room today where he remained 95% on room air with activity -COVID POSITIVE, treated with IV remdesivir and Solu-Medrol, received total of 3 days of IV remdesivir, he is to be discharged on 5 days of oral Decadron. 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     Discharge Instructions  Discharge Instructions     Diet - low sodium heart healthy   Complete by: As directed    Discharge instructions   Complete by: As directed    Follow with Primary MD [name], NP in 7 days   Get CBC, CMP,  checked  by Primary MD next visit.    Activity: As tolerated with Full fall precautions use walker/cane & assistance as needed   Disposition Home    Diet: Heart Healthy  .  For Heart failure patients - Check your Weight same time everyday, if you gain over 2 pounds, or you develop in leg swelling, experience more shortness of breath or chest pain, call your Primary MD immediately. Follow Cardiac Low Salt Diet and 1.5 lit/day fluid restriction.   On your next visit with your primary care physician please Get Medicines reviewed and adjusted.   Please request your Prim.MD to go over all Hospital Tests and Procedure/Radiological results at the follow up, please get all Hospital records sent to your Prim MD by signing hospital release before you go home.   If you experience worsening of your admission symptoms, develop shortness of breath, life threatening emergency, suicidal or homicidal thoughts you must seek medical attention immediately by calling 911 or calling your MD immediately  if symptoms less severe.  You Must read complete instructions/literature along with all the possible adverse  reactions/side effects for all the Medicines you take and that have been prescribed to you. Take any new Medicines after you have completely understood and accpet all the possible adverse reactions/side effects.   Do not drive, operating heavy machinery, perform activities at heights, swimming or participation in  water activities or provide baby sitting services if your were admitted for syncope or siezures until you have seen by Primary MD or a Neurologist and advised to do so again.  Do not drive when taking Pain medications.    Do not take more than prescribed Pain, Sleep and Anxiety Medications  Special Instructions: If you have smoked or chewed Tobacco  in the last 2 yrs please stop smoking, stop any regular Alcohol  and or any Recreational drug use.  Wear Seat belts while driving.   Please note  You were cared for by a hospitalist during your hospital stay. If you have any questions about your discharge medications or the care you received while you were in the hospital after you are discharged, you can call the unit and asked to speak with the hospitalist on call if the hospitalist that took care of you is not available. Once you are discharged, your primary care physician will handle any further medical issues. Please note that NO REFILLS for any discharge medications will be authorized once you are discharged, as it is imperative that you return to your primary care physician (or establish a relationship with a primary care physician if you do not have one) for your aftercare needs so that they can reassess your need for medications and monitor your lab values.   Increase activity slowly   Complete by: As directed       Allergies as of 04/29/2022   No Known Allergies      Medication List     TAKE these medications    aspirin 81 MG chewable tablet Chew 1 tablet (81 mg total) by mouth daily.   atorvastatin 80 MG tablet Commonly known as: LIPITOR TAKE 1 TABLET(80 MG) BY MOUTH DAILY AT 6 PM   B-complex with vitamin C tablet Take 1 tablet by mouth daily.   blood glucose meter kit and supplies Kit Dispense based on patient and insurance preference. Use up to four times daily as directed. (FOR ICD-9 250.00, 250.01).   carvedilol 6.25 MG tablet Commonly known as: COREG TAKE 1  TABLET(6.25 MG) BY MOUTH TWICE DAILY WITH A MEAL What changed: See the new instructions.   dexamethasone 6 MG tablet Commonly known as: DECADRON Take 1 tablet (6 mg total) by mouth daily. Start taking on: April 30, 2022   empagliflozin 10 MG Tabs tablet Commonly known as: [name] Take 1 tablet (10 mg total) by mouth daily before breakfast. What changed: when to take this   Entresto 97-103 MG Generic drug: sacubitril-valsartan Take 1 tablet by mouth 2 (two) times daily.   tamsulosin 0.4 MG Caps capsule Commonly known as: FLOMAX TAKE 1 CAPSULE(0.4 MG) BY MOUTH DAILY AFTER SUPPER What changed: See the new instructions.        No Known Allergies  Consultations: None   Procedures/Studies: DG Chest Portable 1 View  Result Date: 04/26/2022 CLINICAL DATA:  Fever. EXAM: PORTABLE CHEST 1 VIEW COMPARISON:  08/16/2019 FINDINGS: Single lead left-sided pacemaker in place. The heart is upper normal in size. Atherosclerosis of the thoracic aorta. There is eventration of the right hemidiaphragm. Basilar assessment is limited due to portable technique and soft tissue attenuation from habitus. No definite focal  airspace disease. No pulmonary edema, pneumothorax or large pleural effusion. Colonic interposition under the medial left hemidiaphragm. IMPRESSION: No evidence of focal airspace disease, allowing for limited basilar assessment. Electronically Signed   By: Keith Rake M.D.   On: 04/26/2022 21:00      Subjective:  Patient denies any complaints today, no dyspnea, no cough, ambulating in the room with no dyspnea, oxygen saturation sustaining 94% on room air with activity Discharge Exam: Vitals:   04/29/22 0405 04/29/22 0748  BP: (!) 85/60 97/68  Pulse: (!) 54 60  Resp: 14 16  Temp: 98.4 F (36.9 C) 98.1 F (36.7 C)  SpO2: 95% 95%   Vitals:   04/28/22 2339 04/29/22 0029 04/29/22 0405 04/29/22 0748  BP: (!) 80/57 (!) 93/58 (!) 85/60 97/68  Pulse: (!) 55 60 (!) 54 60   Resp: _0 Temp: (!) 97.4 F (36.3 C) 98.2 F (36.8 C) 98.4 F (36.9 C) 98.1 F (36.7 C)  TempSrc: Oral Oral Oral Oral  SpO2: 92% 92% 95% 95%  Weight:      Height:        General: Pt is alert, awake, not in acute distress Cardiovascular: RRR, S1/S2 +, no rubs, no gallops Respiratory: CTA bilaterally, no wheezing, no rhonchi Abdominal: Soft, NT, ND, bowel sounds + Extremities: no edema, no cyanosis    The results of significant diagnostics from this hospitalization (including imaging, microbiology, ancillary and laboratory) are listed below for reference.     Microbiology: Recent Results (from the past 240 hour(s))  Resp Panel by RT-PCR (Flu A&B, Covid) Anterior Nasal Swab     Status: Abnormal   Collection Time: 04/26/22  8:47 PM   Specimen: Anterior Nasal Swab  Result Value Ref Range Status   SARS Coronavirus 2 by RT PCR POSITIVE (A) NEGATIVE Final    Comment: (NOTE) SARS-CoV-2 target nucleic acids are DETECTED.  The SARS-CoV-2 RNA is generally detectable in upper respiratory specimens during the acute phase of infection. Positive results are indicative of the presence of the identified virus, but do not rule out bacterial infection or co-infection with other pathogens not detected by the test. Clinical correlation with patient history and other diagnostic information is necessary to determine patient infection status. The expected result is Negative.  Fact Sheet for Patients: EntrepreneurPulse.com.au  Fact Sheet for Healthcare Providers: IncredibleEmployment.be  This test is not yet approved or cleared by the [place] FDA and  has been authorized for detection and/or diagnosis of SARS-CoV-2 by FDA under an Emergency Use Authorization (EUA).  This EUA will remain in effect (meaning this test can be used) for the duration of  the COVID-19 declaration under Section 564(b)(1) of the A ct, 21 U.S.C. section  360bbb-3(b)(1), unless the authorization is terminated or revoked sooner.     Influenza A by PCR NEGATIVE NEGATIVE Final   Influenza B by PCR NEGATIVE NEGATIVE Final    Comment: (NOTE) The Xpert Xpress SARS-CoV-2/FLU/RSV plus assay is intended as an aid in the diagnosis of influenza from Nasopharyngeal swab specimens and should not be used as a sole basis for treatment. Nasal washings and aspirates are unacceptable for Xpert Xpress SARS-CoV-2/FLU/RSV testing.  Fact Sheet for Patients: EntrepreneurPulse.com.au  Fact Sheet for Healthcare Providers: IncredibleEmployment.be  This test is not yet approved or cleared by the [place] FDA and has been authorized for detection and/or diagnosis of SARS-CoV-2 by FDA under an Emergency Use Authorization (EUA). This EUA will remain in effect (meaning this test can  be used) for the duration of the COVID-19 declaration under Section 564(b)(1) of the Act, 21 U.S.C. section 360bbb-3(b)(1), unless the authorization is terminated or revoked.  Performed at [facility], [address] [place], [place] [address]      Labs: BNP (last 3 results) Recent Labs    04/26/22 2047  BNP 810.1*   Basic Metabolic Panel: Recent Labs  Lab 04/26/22 2046 04/28/22 0305  NA 136 142  K 3.9 4.2  CL 105 109  CO2 22 24  GLUCOSE 127* 205*  BUN 30* 37*  CREATININE 1.67* 1.33*  CALCIUM 8.3* 9.0   Liver Function Tests: Recent Labs  Lab 04/26/22 2046 04/28/22 0305  AST 28 30  ALT 29 30  ALKPHOS 44 43  BILITOT 0.9 0.5  PROT 7.8 7.3  ALBUMIN 3.8 3.3*   Recent Labs  Lab 04/26/22 2046  LIPASE 63*   No results for input(s): "AMMONIA" in the last 168 hours. CBC: Recent Labs  Lab 04/26/22 2046 04/28/22 0305  WBC 9.7 6.0  NEUTROABS 7.2 5.0  HGB 15.2 16.4  HCT 43.9 48.3  MCV 93.6 94.3  PLT 204 199   Cardiac Enzymes: No results for input(s): "CKTOTAL", "CKMB", "CKMBINDEX", "TROPONINI" in  the last 168 hours. BNP: Invalid input(s): "POCBNP" CBG: Recent Labs  Lab 04/28/22 0753 04/28/22 1257 04/28/22 1643 04/28/22 2153 04/29/22 0750  GLUCAP 162* 176* 236* 165* 171*   D-Dimer Recent Labs    04/28/22 0305  DDIMER 0.77*   Hgb A1c Recent Labs    04/27/22 1909  HGBA1C 6.5*   Lipid Profile No results for input(s): "CHOL", "HDL", "LDLCALC", "TRIG", "CHOLHDL", "LDLDIRECT" in the last 72 hours. Thyroid function studies No results for input(s): "TSH", "T4TOTAL", "T3FREE", "THYROIDAB" in the last 72 hours.  Invalid input(s): "FREET3" Anemia work up Recent Labs    04/28/22 0305  FERRITIN 912*   Urinalysis    Component Value Date/Time   COLORURINE YELLOW 04/26/2022 2036   APPEARANCEUR CLEAR 04/26/2022 2036   LABSPEC >=1.030 04/26/2022 2036   PHURINE 5.5 04/26/2022 2036   GLUCOSEU >=500 (A) 04/26/2022 2036   HGBUR TRACE (A) 04/26/2022 2036   BILIRUBINUR NEGATIVE 04/26/2022 2036   KETONESUR 40 (A) 04/26/2022 2036   PROTEINUR 100 (A) 04/26/2022 2036   NITRITE NEGATIVE 04/26/2022 2036   LEUKOCYTESUR NEGATIVE 04/26/2022 2036   Sepsis Labs Recent Labs  Lab 04/26/22 2046 04/28/22 0305  WBC 9.7 6.0   Microbiology Recent Results (from the past 240 hour(s))  Resp Panel by RT-PCR (Flu A&B, Covid) Anterior Nasal Swab     Status: Abnormal   Collection Time: 04/26/22  8:47 PM   Specimen: Anterior Nasal Swab  Result Value Ref Range Status   SARS Coronavirus 2 by RT PCR POSITIVE (A) NEGATIVE Final    Comment: (NOTE) SARS-CoV-2 target nucleic acids are DETECTED.  The SARS-CoV-2 RNA is generally detectable in upper respiratory specimens during the acute phase of infection. Positive results are indicative of the presence of the identified virus, but do not rule out bacterial infection or co-infection with other pathogens not detected by the test. Clinical correlation with patient history and other diagnostic information is necessary to determine  patient infection status. The expected result is Negative.  Fact Sheet for Patients: EntrepreneurPulse.com.au  Fact Sheet for Healthcare Providers: IncredibleEmployment.be  This test is not yet approved or cleared by the [place] FDA and  has been authorized for detection and/or diagnosis of SARS-CoV-2 by FDA under an Emergency Use Authorization (EUA).  This EUA will remain in effect (meaning this test can be used) for the duration of  the COVID-19 declaration under Section 564(b)(1) of the A ct, 21 U.S.C. section 360bbb-3(b)(1), unless the authorization is terminated or revoked sooner.     Influenza A by PCR NEGATIVE NEGATIVE Final   Influenza B by PCR NEGATIVE NEGATIVE Final    Comment: (NOTE) The Xpert Xpress SARS-CoV-2/FLU/RSV plus assay is intended as an aid in the diagnosis of influenza from Nasopharyngeal swab specimens and should not be used as a sole basis for treatment. Nasal washings and aspirates are unacceptable for Xpert Xpress SARS-CoV-2/FLU/RSV testing.  Fact Sheet for Patients: EntrepreneurPulse.com.au  Fact Sheet for Healthcare Providers: IncredibleEmployment.be  This test is not yet approved or cleared by the [place] FDA and has been authorized for detection and/or diagnosis of SARS-CoV-2 by FDA under an Emergency Use Authorization (EUA). This EUA will remain in effect (meaning this test can be used) for the duration of the COVID-19 declaration under Section 564(b)(1) of the Act, 21 U.S.C. section 360bbb-3(b)(1), unless the authorization is terminated or revoked.  Performed at [facility], [address] [place], [place] [address]      Time coordinating discharge: Over 30 minutes  SIGNED:   Phillips Climes, MD  [facility] 04/29/2022, 10:00 AM Pager   If 7PM-7AM, please contact night-coverage [email or web address]

## 2022-04-30 ENCOUNTER — Telehealth: Payer: Self-pay | Admitting: *Deleted

## 2022-04-30 ENCOUNTER — Encounter: Payer: Self-pay | Admitting: *Deleted

## 2022-04-30 NOTE — Patient Outreach (Signed)
  Care Coordination TOC Note Transition Care Management Follow-up Telephone Call Date of discharge and from where: 04/29/22- [facility] How have you been since you were released from the hospital? Per caregiver/ daughter Cecelia Byars on [place]-- speaks fluent English, is primary contact: "He is doing well; he is recovering fine and not having problems" Any questions or concerns? No  Items Reviewed: Did the pt receive and understand the discharge instructions provided? Yes  Medications obtained and verified? Yes  - provided rationale for newly prescribed steroid in setting of recent COVID infection requiring hospitalization Other? No  Any new allergies since your discharge? No  Dietary orders reviewed? Yes Do you have support at home? Yes  patient's spouse currently assisting patient, lives with patient; daughter lives in same neighborhood and checks in frequently  [place] and Equipment/Supplies: Were home health services ordered? no If so, what is the name of the agency? N/A  Has the agency set up a time to come to the patient's home? not applicable Were any new equipment or medical supplies ordered?  No What is the name of the medical supply agency? N/A Were you able to get the supplies/equipment? not applicable Do you have any questions related to the use of the equipment or supplies? No N/A  Functional Questionnaire: (I = Independent and D = Dependent) ADLs: I spouse and daughter assists as indicated/ needed  Bathing/Dressing- I  spouse and daughter assists as indicated/ needed  Meal Prep- I  spouse and daughter assists as indicated/ needed  Eating- I  Maintaining continence- I  Transferring/Ambulation- I  Managing Meds- I  spouse and daughter assists as indicated/ needed  Follow up appointments reviewed:  PCP Hospital f/u appt confirmed? No  Scheduled to see -- on - @ - facilitated scheduling of post-hospital discharge PCP office visit Specialist Hospital f/u appt  confirmed? No  Scheduled to see - on - @ - Are transportation arrangements needed? No  If their condition worsens, is the pt aware to call PCP or go to the Emergency Dept.? Yes Was the patient provided with contact information for the PCP's office or ED? Yes Was to pt encouraged to call back with questions or concerns? Yes  SDOH assessments and interventions completed:   Yes  Care Coordination Interventions Activated:  Yes   Care Coordination Interventions:  PCP follow up appointment requested and provided education around rationale for newly prescribed medications post-hospital discharge   Encounter Outcome:  Pt. Visit Completed    Oneta Rack, RN, BSN, CCRN Alumnus RN CM Care Coordination/ Transition of [place] Management [phone]: direct office

## 2022-05-04 ENCOUNTER — Telehealth: Payer: Self-pay | Admitting: Registered Nurse

## 2022-05-04 NOTE — Telephone Encounter (Signed)
FYI. I called daughter Cecelia Byars. No answer, LM for daughter Cecelia Byars to call back to the office, so we can schedule a hospital follow up for pt.

## 2022-05-07 NOTE — Telephone Encounter (Signed)
FYI I called daughter Cecelia Byars again today. Still no answer, LM for daughter Cecelia Byars to call back to the office, so we can schedule a hospital follow up for pt. This is my 3rd time calling pt daughter.

## 2022-05-17 ENCOUNTER — Ambulatory Visit (INDEPENDENT_AMBULATORY_CARE_PROVIDER_SITE_OTHER): Payer: PPO

## 2022-05-17 DIAGNOSIS — I5022 Chronic systolic (congestive) heart failure: Secondary | ICD-10-CM | POA: Diagnosis not present

## 2022-05-18 LAB — CUP PACEART REMOTE DEVICE CHECK
Battery Remaining Longevity: 111 mo
Battery Voltage: 3.01 V
Brady Statistic RV Percent Paced: 0.56 %
Date Time Interrogation Session: 20230925001805
HighPow Impedance: 73 Ohm
Implantable Lead Implant Date: 20201224
Implantable Lead Location: 753860
Implantable Pulse Generator Implant Date: 20201224
Lead Channel Impedance Value: 247 Ohm
Lead Channel Impedance Value: 342 Ohm
Lead Channel Pacing Threshold Amplitude: 0.75 V
Lead Channel Pacing Threshold Pulse Width: 0.4 ms
Lead Channel Sensing Intrinsic Amplitude: 6.75 mV
Lead Channel Sensing Intrinsic Amplitude: 6.75 mV
Lead Channel Setting Pacing Amplitude: 2 V
Lead Channel Setting Pacing Pulse Width: 0.4 ms
Lead Channel Setting Sensing Sensitivity: 0.3 mV

## 2022-05-27 NOTE — Progress Notes (Signed)
Remote ICD transmission.   

## 2022-07-12 ENCOUNTER — Encounter (HOSPITAL_COMMUNITY): Payer: Self-pay

## 2022-07-12 ENCOUNTER — Ambulatory Visit (HOSPITAL_COMMUNITY)
Admission: RE | Admit: 2022-07-12 | Discharge: 2022-07-12 | Disposition: A | Discharge status: Home / Self Care | Payer: PPO | Source: Ambulatory Visit | Attending: Family Medicine | Admitting: Family Medicine

## 2022-07-12 VITALS — BP 112/70 | HR 56 | Wt 190.8 lb

## 2022-07-12 DIAGNOSIS — I469 Cardiac arrest, cause unspecified: Secondary | ICD-10-CM | POA: Diagnosis not present

## 2022-07-12 DIAGNOSIS — N1831 Chronic kidney disease, stage 3a: Secondary | ICD-10-CM | POA: Diagnosis not present

## 2022-07-12 DIAGNOSIS — Z8674 Personal history of sudden cardiac arrest: Secondary | ICD-10-CM | POA: Diagnosis present

## 2022-07-12 DIAGNOSIS — I2510 Atherosclerotic heart disease of native coronary artery without angina pectoris: Secondary | ICD-10-CM

## 2022-07-12 DIAGNOSIS — I252 Old myocardial infarction: Secondary | ICD-10-CM | POA: Insufficient documentation

## 2022-07-12 DIAGNOSIS — Z79899 Other long term (current) drug therapy: Secondary | ICD-10-CM | POA: Insufficient documentation

## 2022-07-12 DIAGNOSIS — I130 Hypertensive heart and chronic kidney disease with heart failure and stage 1 through stage 4 chronic kidney disease, or unspecified chronic kidney disease: Secondary | ICD-10-CM | POA: Insufficient documentation

## 2022-07-12 DIAGNOSIS — E1122 Type 2 diabetes mellitus with diabetic chronic kidney disease: Secondary | ICD-10-CM | POA: Insufficient documentation

## 2022-07-12 DIAGNOSIS — I5022 Chronic systolic (congestive) heart failure: Secondary | ICD-10-CM | POA: Diagnosis not present

## 2022-07-12 DIAGNOSIS — N184 Chronic kidney disease, stage 4 (severe): Secondary | ICD-10-CM

## 2022-07-12 DIAGNOSIS — Z7984 Long term (current) use of oral hypoglycemic drugs: Secondary | ICD-10-CM | POA: Insufficient documentation

## 2022-07-12 LAB — BASIC METABOLIC PANEL
Anion gap: 8 (ref 5–15)
BUN: 21 mg/dL (ref 8–23)
CO2: 25 mmol/L (ref 22–32)
Calcium: 9.3 mg/dL (ref 8.9–10.3)
Chloride: 106 mmol/L (ref 98–111)
Creatinine, Ser: 1.01 mg/dL (ref 0.61–1.24)
GFR, Estimated: >60 mL/min (ref >60)
Glucose, Bld: 102 mg/dL — ABNORMAL HIGH (ref 70–99)
Potassium: 4.2 mmol/L (ref 3.5–5.1)
Sodium: 139 mmol/L (ref 135–145)

## 2022-07-12 LAB — BRAIN NATRIURETIC PEPTIDE: B Natriuretic Peptide: 134.6 pg/mL — ABNORMAL HIGH (ref 0.0–100.0)

## 2022-07-12 LAB — LIPID PANEL
Cholesterol: 220 mg/dL — ABNORMAL HIGH (ref 0–200)
HDL: 42 mg/dL (ref >40)
LDL Cholesterol: 139 mg/dL — ABNORMAL HIGH (ref 0–99)
Total CHOL/HDL Ratio: 5.2 RATIO
Triglycerides: 197 mg/dL — ABNORMAL HIGH (ref <150)
VLDL: 39 mg/dL (ref 0–40)

## 2022-07-12 MED ORDER — SPIRONOLACTONE 25 MG PO TABS: 25.0000 mg | ORAL_TABLET | Freq: Every day | ORAL | 3 refills | Status: DC

## 2022-07-12 NOTE — Addendum Note (Signed)
Encounter addended by: Payton Mccallum, RN on: 07/12/2022 11:58 AM  Actions taken: Clinical Note Signed, Flowsheet accepted, Order list changed, Diagnosis association updated

## 2022-07-12 NOTE — Addendum Note (Signed)
Encounter addended by: Rafael Bihari, FNP on: 07/12/2022 1:28 PM  Actions taken: Clinical Note Signed

## 2022-07-12 NOTE — Patient Instructions (Signed)
Thank you for coming in today  Labs were done today, if any labs are abnormal the clinic will call you No news is good news  INCREASE Spironolactone 25 mg 1 tablet daily   Your physician has requested that you have an echocardiogram. Echocardiography is a painless test that uses sound waves to create images of your heart. It provides your doctor with information about the size and shape of your heart and how well your heart's chambers and valves are working. This procedure takes approximately one hour. There are no restrictions for this procedure. In 6 months   Your physician recommends that you schedule a follow-up appointment in:  1 year with Dr. Haroldine Laws  Your physician recommends that you return for lab work in: 1 week BMET    Do the following things EVERYDAY: Weigh yourself in the morning before breakfast. Write it down and keep it in a log. Take your medicines as prescribed Eat low salt foods--Limit salt (sodium) to 2000 mg per day.  Stay as active as you can everyday Limit all fluids for the day to less than 2 liters  At the [facility], you and your health needs are our priority. As part of our continuing mission to provide you with exceptional heart care, we have created designated Provider Care Teams. These Care Teams include your primary Cardiologist (physician) and Advanced Practice Providers (APPs- Physician Assistants and Nurse Practitioners) who all work together to provide you with the care you need, when you need it.   You may see any of the following providers on your designated Care Team at your next follow up: Dr Glori Bickers Dr Loralie Champagne Dr. Roxana Hires, NP Lyda Jester, [place] [facility] [place], [place] Forestine Na, NP Audry Riles, PharmD   Please be sure to bring in all your medications bottles to every appointment.   If you have any questions or concerns before your next appointment please send [place] a  message through [place] or call our office at [phone].    TO LEAVE A MESSAGE FOR THE NURSE SELECT OPTION 2, PLEASE LEAVE A MESSAGE INCLUDING: YOUR NAME DATE OF BIRTH CALL BACK NUMBER REASON FOR CALL**this is important as we prioritize the call backs  YOU WILL RECEIVE A CALL BACK THE SAME DAY AS LONG AS YOU CALL BEFORE 4:00 PM

## 2022-07-12 NOTE — Progress Notes (Addendum)
Advanced Heart Failure Clinic Note   PCP: [name], NP HF Cardiologist: Dr. Haroldine Laws  HPI:  Lawrence Olson is a 71 y.o. male from [place] with h/o CAD, systolic HF and VF cardiac arrest.   He had no known medical problems until 04/16/19 when he experienced witnessed VF arrest at work. Had bystander CPR x 20 mins with defib and intubation in field. He was taken to cath lab for emergent catheterization. Attempts at opening LAD unsuccessful (felt to be chronic). Impella placed. EF 25-30%. Had protracted course with respiratory failure, renal failure (requiring CVVHD) and anoxic brain injury. There was also a question of acute amio pneumonitis with ESR> 100. Discharged to CIR. Creatinine plateaued at 2.4 so ACE/ARB/ARNI not started.   Had ICD placed 08/16/19 by Dr. Curt Bears   Follow up 11/22, doing well NYHA I and euvolemic.    Admitted 9/23 with COVID, given IV remdesivir and decadron. Stable from HF standpoint, had mild AKI. No GDMT changes.  Today he returns for yearly HF follow up, with his wife. Overall feeling great. Walks his dogs for 1-2 hrs/day with no SOB. Denies palpitations, abnormal bleeding, CP, dizziness, edema, or PND/Orthopnea. Appetite ok. No fever or chills. Weight at home 186 pounds. Taking all medications.   Cardiac Studies - Echo 10/23/20: EF 40-45% RV ok  - Echo 12/20 EF 30-35%    ROS: All systems negative except as listed in HPI, PMH and Problem List.  SH:  Social History   Socioeconomic History   Marital status: Married    Spouse name: Not on file   Number of children: Not on file   Years of education: Not on file   Highest education level: Not on file  Occupational History   Not on file  Tobacco Use   Smoking status: Former    Types: Cigarettes   Smokeless tobacco: Never  Vaping Use   Vaping Use: Never used  Substance and Sexual Activity   Alcohol use: Yes    Comment: rarely   Drug use: Never   Sexual activity: Not Currently  Other Topics  Concern   Not on file  Social History Narrative   Not on file   Social Determinants of Health   Financial Resource Strain: Medium Risk (02/09/2021)   Overall Financial Resource Strain (CARDIA)    Difficulty of Paying Living Expenses: Somewhat hard  Food Insecurity: No Food Insecurity (04/30/2022)   Hunger Vital Sign    Worried About Running Out of Food in the Last Year: Never true    Ran Out of Food in the Last Year: Never true  Transportation Needs: No Transportation Needs (04/30/2022)   PRAPARE - Hydrologist (Medical): No    Lack of Transportation (Non-Medical): No  Physical Activity: Sufficiently Active (02/09/2021)   Exercise Vital Sign    Days of Exercise per Week: 5 days    Minutes of Exercise per Session: 30 min  Stress: No Stress Concern Present (02/09/2021)   [place]    Feeling of Stress : Not at all  Social Connections: Moderately Isolated (02/09/2021)   Social Connection and Isolation Panel [NHANES]    Frequency of Communication with Friends and Family: Three times a week    Frequency of Social Gatherings with Friends and Family: Once a week    Attends Religious Services: Never    Marine scientist or Organizations: No    Attends Archivist Meetings: Never  Marital Status: Married  Human resources officer Violence: Not At Risk (06/08/2019)   Humiliation, Afraid, Rape, and Kick questionnaire    Fear of Current or Ex-Partner: No    Emotionally Abused: No    Physically Abused: No    Sexually Abused: No    FH:  Family History  Problem Relation Age of Onset   Healthy Brother    Healthy Sister     Past Medical History:  Diagnosis Date   Acute ST elevation myocardial infarction (STEMI) involving left anterior descending (LAD) coronary artery ([place]) 04/16/2019   Anoxic brain injury ([place])    Cardiac arrest ([place]) 04/16/2019   Chronic combined systolic and diastolic  congestive heart failure (HCC)    Controlled type 2 diabetes mellitus with hyperglycemia ([place])    Coronary artery disease involving native coronary artery of native heart without angina pectoris    Jaundice    needed hospitalization in [place]   VT (ventricular tachycardia) (HCC)     Current Outpatient Medications  Medication Sig Dispense Refill   aspirin 81 MG chewable tablet Chew 1 tablet (81 mg total) by mouth daily.     atorvastatin (LIPITOR) 80 MG tablet TAKE 1 TABLET(80 MG) BY MOUTH DAILY AT 6 PM 30 tablet 6   B Complex-C (B-COMPLEX WITH VITAMIN C) tablet Take 1 tablet by mouth daily.     blood glucose meter kit and supplies KIT Dispense based on patient and insurance preference. Use up to four times daily as directed. (FOR ICD-9 250.00, 250.01). 1 each 0   carvedilol (COREG) 6.25 MG tablet TAKE 1 TABLET(6.25 MG) BY MOUTH TWICE DAILY WITH A MEAL 180 tablet 3   empagliflozin ([name]) 10 MG TABS tablet Take 1 tablet (10 mg total) by mouth daily before breakfast. 90 tablet 3   sacubitril-valsartan (ENTRESTO) 97-103 MG Take 1 tablet by mouth 2 (two) times daily. 180 tablet 3   tamsulosin (FLOMAX) 0.4 MG CAPS capsule TAKE 1 CAPSULE(0.4 MG) BY MOUTH DAILY AFTER SUPPER 90 capsule 1   No current facility-administered medications for this encounter.    BP 112/70   Pulse (!) 56   Wt 86.5 kg (190 lb 12.8 oz)   SpO2 95%   BMI 30.80 kg/m   Wt Readings from Last 3 Encounters:  07/12/22 86.5 kg (190 lb 12.8 oz)  04/26/22 87.5 kg (193 lb)  12/07/21 85.7 kg (189 lb)   PHYSICAL EXAM: General:  NAD. No resp difficulty HEENT: Normal Neck: Supple. No JVD. Carotids 2+ bilat; no bruits. No lymphadenopathy or thryomegaly appreciated. Cor: PMI nondisplaced. Brady rate & rhythm. No rubs, gallops or murmurs. Lungs: Clear Abdomen: Soft, nontender, nondistended. No hepatosplenomegaly. No bruits or masses. Good bowel sounds. Extremities: No cyanosis, clubbing, rash, edema Neuro: Alert & oriented  x 3, cranial nerves grossly intact. Moves all 4 extremities w/o difficulty. Affect pleasant.  Device interrogation (personally reviewed): OptiVol mildly up, thoracic impedence stable, no VT or AF, 4 hrs/day activity  ECG (personally reviewed): SB 49 bpm  ASSESSMENT & PLAN: 1. Chronic systolic HF - Echo 3/87 EF 25-30% in setting of cardiac arrest and severe iCM - Echo 12/20 EF 30-35% - Echo 10/23/20: EF 40-45% RV ok  - s/p MDT ICD (Dr. Lennie Odor)  - ICD interrogated personally. See above - Doing great. NYHA I. Volume looks good, OptiVol mildly elevated. - Increase spironolactone to 25 mg daily. - Continue carvedilol 6.25 mg bid  - Continue Entresto 97/103 mg bid - Continue [name] 10 mg daily. - Doing great. On great  meds.  - Labs today, repeat BMET in 1 week. - Repeat echo in 6 months or so.  2. CAD s/p recent anterior MI  - treated medically as unable to open LAD CTO - No s/s ischemia  Films reviewed with IC team and not options for PCI - Continue ASA/statin/[name] - Last LDL 42. Recheck lipids today.  3. H/o VF arrest - s/p MDT ICD. As above.  4. DM2  - Continue [name]. No GU symtpoms  5. CKD 3a - last creatinine 1.33 - Continue SGLT2i. - Labs today.  Follow up in 1 year with Dr. Haroldine Laws.  Rafael Bihari, FNP  11:28 AM

## 2022-07-13 ENCOUNTER — Telehealth (HOSPITAL_COMMUNITY): Payer: Self-pay | Admitting: Cardiology

## 2022-07-13 DIAGNOSIS — I2510 Atherosclerotic heart disease of native coronary artery without angina pectoris: Secondary | ICD-10-CM

## 2022-07-13 MED ORDER — ATORVASTATIN CALCIUM 80 MG PO TABS: ORAL_TABLET | ORAL | 11 refills | Status: DC

## 2022-07-13 NOTE — Telephone Encounter (Signed)
-----   Message from Rafael Bihari, [place] sent at 07/12/2022  3:51 PM EST ----- LDL is markedly elevated from previous. Has he been taking his atorvastatin 80 mg consistently? If so, please add Zetia 10 and refer to lipid clinic.  If he has not been consistent with statin, restart atorva 80 mg daily and check LFTs and lipids in 2 months please. With CAD, LDL goal < 55

## 2022-07-13 NOTE — Telephone Encounter (Signed)
Patient called.  Patient aware via daughter. Reports he has not been taking Will restart and labs appt card mailed

## 2022-07-14 ENCOUNTER — Telehealth (HOSPITAL_COMMUNITY): Payer: Self-pay

## 2022-07-14 NOTE — Telephone Encounter (Signed)
Advanced Heart Failure Patient Advocate Encounter  Received renewal notification for [employer]). This patient is also currently taking [name] [employer]) and is eligible for a grant that is currently open and would cover the cost of both medications.   Spoke to daughter and they are okay with submitting for the grant, but will need to confirm the income for the household. I will call back Monday 11/27.   Clista Bernhardt, CPhT Rx Patient Advocate Phone: [phone]

## 2022-07-19 ENCOUNTER — Other Ambulatory Visit (HOSPITAL_COMMUNITY): Payer: Self-pay | Admitting: *Deleted

## 2022-07-19 MED ORDER — EMPAGLIFLOZIN 10 MG PO TABS: 10.0000 mg | ORAL_TABLET | Freq: Every day | ORAL | 3 refills | Status: DC

## 2022-07-19 MED ORDER — ENTRESTO 97-103 MG PO TABS: 1.0000 | ORAL_TABLET | Freq: Two times a day (BID) | ORAL | 3 refills | Status: DC

## 2022-07-19 MED ORDER — EMPAGLIFLOZIN 10 MG PO TABS
10.0000 mg | ORAL_TABLET | Freq: Every day | ORAL | 3 refills | Status: DC
  Filled 2022-07-27: qty 90, 90d supply, fill #0
  Filled 2022-11-02 (×3): qty 90, 90d supply, fill #1
  Filled 2023-02-01: qty 90, 90d supply, fill #2

## 2022-07-19 NOTE — Telephone Encounter (Signed)
Advanced Heart Failure Patient Advocate Encounter  The patient was approved for a [employer] grant that will help cover the cost of Entresto, [name].  Total amount awarded, $10,000.  Effective: 06/19/2022 - 06/19/2023.  BIN [id number] PCN [id number] Group [id number] ID [id number]  New prescription(s) sent to [facility]. Patient provided with approval and processing information via email.  Clista Bernhardt, CPhT Rx Patient Advocate Phone: [phone]

## 2022-07-20 ENCOUNTER — Other Ambulatory Visit (HOSPITAL_COMMUNITY): Payer: PPO

## 2022-07-22 ENCOUNTER — Ambulatory Visit (HOSPITAL_COMMUNITY)
Admission: RE | Admit: 2022-07-22 | Discharge: 2022-07-22 | Disposition: A | Discharge status: Home / Self Care | Payer: PPO | Source: Ambulatory Visit | Attending: Family Medicine | Admitting: Family Medicine

## 2022-07-22 DIAGNOSIS — I252 Old myocardial infarction: Secondary | ICD-10-CM | POA: Diagnosis not present

## 2022-07-22 DIAGNOSIS — I5022 Chronic systolic (congestive) heart failure: Secondary | ICD-10-CM | POA: Insufficient documentation

## 2022-07-22 DIAGNOSIS — Z950 Presence of cardiac pacemaker: Secondary | ICD-10-CM | POA: Insufficient documentation

## 2022-07-22 DIAGNOSIS — E785 Hyperlipidemia, unspecified: Secondary | ICD-10-CM | POA: Diagnosis not present

## 2022-07-22 LAB — ECHOCARDIOGRAM COMPLETE
AR max vel: 2.75 cm2
AV Area VTI: 2.58 cm2
AV Area mean vel: 2.61 cm2
AV Mean grad: 2 mmHg
AV Peak grad: 3.6 mmHg
Ao pk vel: 0.95 m/s
Area-P 1/2: 3.65 cm2
Calc EF: 55.7 %
MV M vel: 1.5 m/s
MV Peak grad: 9 mmHg
P 1/2 time: 873 msec
S' Lateral: 3 cm
Single Plane A2C EF: 51.7 %
Single Plane A4C EF: 57.3 %

## 2022-07-22 NOTE — Progress Notes (Signed)
  Echocardiogram 2D Echocardiogram has been performed.  Lawrence Olson 07/22/2022, 11:30 AM

## 2022-07-23 NOTE — Addendum Note (Signed)
Encounter addended by: Rafael Bihari, FNP on: 07/23/2022 8:12 AM  Actions taken: Clinical Note Signed

## 2022-07-26 ENCOUNTER — Other Ambulatory Visit (HOSPITAL_COMMUNITY): Payer: Self-pay

## 2022-07-27 ENCOUNTER — Other Ambulatory Visit (HOSPITAL_COMMUNITY): Payer: Self-pay | Admitting: *Deleted

## 2022-07-27 ENCOUNTER — Other Ambulatory Visit (HOSPITAL_COMMUNITY): Payer: Self-pay

## 2022-07-27 MED ORDER — ENTRESTO 97-103 MG PO TABS
1.0000 | ORAL_TABLET | Freq: Two times a day (BID) | ORAL | 3 refills | Status: DC
  Filled 2022-07-27: qty 180, 90d supply, fill #0

## 2022-07-27 NOTE — Telephone Encounter (Signed)
Advanced Heart Failure Patient Advocate Encounter  Patients daughter called, [facility] pharmacies are not able to split bill Medicare to this grant. Prescriptions are being transferred to [facility] and will be delivered.

## 2022-08-18 ENCOUNTER — Ambulatory Visit (INDEPENDENT_AMBULATORY_CARE_PROVIDER_SITE_OTHER): Payer: PPO

## 2022-08-18 DIAGNOSIS — I255 Ischemic cardiomyopathy: Secondary | ICD-10-CM | POA: Diagnosis not present

## 2022-08-18 LAB — CUP PACEART REMOTE DEVICE CHECK
Battery Remaining Longevity: 109 mo
Battery Voltage: 3.01 V
Brady Statistic RV Percent Paced: 0.19 %
Date Time Interrogation Session: 20231227022605
HighPow Impedance: 71 Ohm
Implantable Lead Connection Status: 753985
Implantable Lead Implant Date: 20201224
Implantable Lead Location: 753860
Implantable Pulse Generator Implant Date: 20201224
Lead Channel Impedance Value: 285 Ohm
Lead Channel Impedance Value: 342 Ohm
Lead Channel Pacing Threshold Amplitude: 0.75 V
Lead Channel Pacing Threshold Pulse Width: 0.4 ms
Lead Channel Sensing Intrinsic Amplitude: 7.25 mV
Lead Channel Sensing Intrinsic Amplitude: 7.25 mV
Lead Channel Setting Pacing Amplitude: 2 V
Lead Channel Setting Pacing Pulse Width: 0.4 ms
Lead Channel Setting Sensing Sensitivity: 0.3 mV
Zone Setting Status: 755011
Zone Setting Status: 755011

## 2022-08-20 ENCOUNTER — Other Ambulatory Visit (HOSPITAL_COMMUNITY): Payer: Self-pay

## 2022-08-20 MED ORDER — ENTRESTO 97-103 MG PO TABS: 1.0000 | ORAL_TABLET | Freq: Two times a day (BID) | ORAL | 3 refills | Status: DC

## 2022-09-02 ENCOUNTER — Other Ambulatory Visit (HOSPITAL_COMMUNITY): Payer: Self-pay

## 2022-09-02 MED ORDER — ENTRESTO 97-103 MG PO TABS: 1.0000 | ORAL_TABLET | Freq: Two times a day (BID) | ORAL | 3 refills | Status: DC

## 2022-09-07 NOTE — Progress Notes (Signed)
Remote ICD transmission.   

## 2022-09-13 ENCOUNTER — Ambulatory Visit (HOSPITAL_COMMUNITY)
Admission: RE | Admit: 2022-09-13 | Discharge: 2022-09-13 | Disposition: A | Discharge status: Home / Self Care | Payer: PPO | Source: Ambulatory Visit | Attending: Internal Medicine | Admitting: Internal Medicine

## 2022-09-13 DIAGNOSIS — I5022 Chronic systolic (congestive) heart failure: Secondary | ICD-10-CM

## 2022-09-13 DIAGNOSIS — I2510 Atherosclerotic heart disease of native coronary artery without angina pectoris: Secondary | ICD-10-CM | POA: Diagnosis not present

## 2022-09-13 LAB — LIPID PANEL
Cholesterol: 131 mg/dL (ref 0–200)
HDL: 46 mg/dL (ref >40)
LDL Cholesterol: 66 mg/dL (ref 0–99)
Total CHOL/HDL Ratio: 2.8 RATIO
Triglycerides: 97 mg/dL (ref <150)
VLDL: 19 mg/dL (ref 0–40)

## 2022-09-13 LAB — HEPATIC FUNCTION PANEL
ALT: 30 U/L (ref 0–44)
AST: 22 U/L (ref 15–41)
Albumin: 4 g/dL (ref 3.5–5.0)
Alkaline Phosphatase: 68 U/L (ref 38–126)
Bilirubin, Direct: <0.1 mg/dL (ref 0.0–0.2)
Total Bilirubin: 0.4 mg/dL (ref 0.3–1.2)
Total Protein: 7.3 g/dL (ref 6.5–8.1)

## 2022-09-13 LAB — BASIC METABOLIC PANEL
Anion gap: 8 (ref 5–15)
BUN: 31 mg/dL — ABNORMAL HIGH (ref 8–23)
CO2: 25 mmol/L (ref 22–32)
Calcium: 9.3 mg/dL (ref 8.9–10.3)
Chloride: 107 mmol/L (ref 98–111)
Creatinine, Ser: 1.21 mg/dL (ref 0.61–1.24)
GFR, Estimated: >60 mL/min (ref >60)
Glucose, Bld: 112 mg/dL — ABNORMAL HIGH (ref 70–99)
Potassium: 4.5 mmol/L (ref 3.5–5.1)
Sodium: 140 mmol/L (ref 135–145)

## 2022-11-02 ENCOUNTER — Other Ambulatory Visit (HOSPITAL_COMMUNITY): Payer: Self-pay

## 2022-11-02 ENCOUNTER — Other Ambulatory Visit: Payer: Self-pay

## 2022-11-02 MED ORDER — ENTRESTO 97-103 MG PO TABS
1.0000 | ORAL_TABLET | Freq: Two times a day (BID) | ORAL | 3 refills | Status: DC
  Filled 2022-11-02: qty 180, 90d supply, fill #0
  Filled 2023-03-02 (×2): qty 180, 90d supply, fill #1
  Filled 2023-05-24: qty 180, 90d supply, fill #2
  Filled 2023-08-22 (×2): qty 180, 90d supply, fill #3

## 2022-11-15 ENCOUNTER — Ambulatory Visit (INDEPENDENT_AMBULATORY_CARE_PROVIDER_SITE_OTHER): Payer: HMO

## 2022-11-15 DIAGNOSIS — I255 Ischemic cardiomyopathy: Secondary | ICD-10-CM

## 2022-11-15 DIAGNOSIS — I5022 Chronic systolic (congestive) heart failure: Secondary | ICD-10-CM

## 2022-11-15 LAB — CUP PACEART REMOTE DEVICE CHECK
Battery Remaining Longevity: 105 mo
Battery Voltage: 3.01 V
Brady Statistic RV Percent Paced: 0.33 %
Date Time Interrogation Session: 20240325044222
HighPow Impedance: 66 Ohm
Implantable Lead Connection Status: 753985
Implantable Lead Implant Date: 20201224
Implantable Lead Location: 753860
Implantable Pulse Generator Implant Date: 20201224
Lead Channel Impedance Value: 247 Ohm
Lead Channel Impedance Value: 342 Ohm
Lead Channel Pacing Threshold Amplitude: 0.75 V
Lead Channel Pacing Threshold Pulse Width: 0.4 ms
Lead Channel Sensing Intrinsic Amplitude: 6.375 mV
Lead Channel Sensing Intrinsic Amplitude: 6.375 mV
Lead Channel Setting Pacing Amplitude: 2 V
Lead Channel Setting Pacing Pulse Width: 0.4 ms
Lead Channel Setting Sensing Sensitivity: 0.3 mV
Zone Setting Status: 755011
Zone Setting Status: 755011

## 2022-11-29 ENCOUNTER — Encounter: Payer: Self-pay | Admitting: Cardiology

## 2022-12-23 NOTE — Progress Notes (Signed)
Remote ICD transmission.   

## 2022-12-29 ENCOUNTER — Ambulatory Visit: Payer: HMO | Attending: Physician Assistant | Admitting: Physician Assistant

## 2022-12-29 ENCOUNTER — Encounter: Payer: Self-pay | Admitting: Physician Assistant

## 2022-12-29 VITALS — BP 106/60 | HR 62 | Ht 64.0 in | Wt 190.6 lb

## 2022-12-29 DIAGNOSIS — Z79899 Other long term (current) drug therapy: Secondary | ICD-10-CM | POA: Diagnosis not present

## 2022-12-29 DIAGNOSIS — Z95810 Presence of automatic (implantable) cardiac defibrillator: Secondary | ICD-10-CM

## 2022-12-29 DIAGNOSIS — I2510 Atherosclerotic heart disease of native coronary artery without angina pectoris: Secondary | ICD-10-CM | POA: Diagnosis not present

## 2022-12-29 DIAGNOSIS — I469 Cardiac arrest, cause unspecified: Secondary | ICD-10-CM | POA: Diagnosis not present

## 2022-12-29 DIAGNOSIS — I5022 Chronic systolic (congestive) heart failure: Secondary | ICD-10-CM | POA: Diagnosis not present

## 2022-12-29 LAB — CUP PACEART INCLINIC DEVICE CHECK
Battery Remaining Longevity: 102 mo
Battery Voltage: 3.01 V
Brady Statistic RV Percent Paced: 0.24 %
Date Time Interrogation Session: 20240508135913
HighPow Impedance: 66 Ohm
Implantable Lead Connection Status: 753985
Implantable Lead Implant Date: 20201224
Implantable Lead Location: 753860
Implantable Pulse Generator Implant Date: 20201224
Lead Channel Impedance Value: 285 Ohm
Lead Channel Impedance Value: 342 Ohm
Lead Channel Pacing Threshold Amplitude: 0.75 V
Lead Channel Pacing Threshold Pulse Width: 0.4 ms
Lead Channel Sensing Intrinsic Amplitude: 6.875 mV
Lead Channel Sensing Intrinsic Amplitude: 6.875 mV
Lead Channel Setting Pacing Amplitude: 2 V
Lead Channel Setting Pacing Pulse Width: 0.4 ms
Lead Channel Setting Sensing Sensitivity: 0.3 mV
Zone Setting Status: 755011
Zone Setting Status: 755011

## 2022-12-29 NOTE — Progress Notes (Signed)
Cardiology Office Note Date:  12/29/2022  Patient ID:  Lawrence Olson, DOB [birth year], MRN [id number] PCP:  Janeece Agee, NP  Cardiologist:  Dr. Gala Romney Electrophysiologist: Dr. Elberta Fortis     Chief Complaint: annual visit  History of Present Illness: Lawrence Olson is a 72 y.o. male with history of CAD, HTN, HLD, cardiac arrest, chronic CHF (systolic), ICM, CKD (IIIa), DM  On 04/16/2019 he experienced a witnessed arrest while at work. He had bystander CPR for 20 minutes. Had emergent left heart catheterization but had had a totally occluded LAD and was not able to be reopened. Impella was placed and he was taken to the ICU. His ejection fraction was found to be 25 to 30%. He had a protracted course complicated by respiratory failure, renal failure, anoxic brain injury. He was discharged with a LifeVest.   ICD implanted  He saw Dr. Elberta Fortis 12/07/21, doing well, no symptoms reported, no changes made  He saw the HF team 07/12/22, doing well, active, no changes made.  TODAY He is accompanied by his wife.   He walks his dog every day a couple miles, stays very active and feels quite well No CP, palpitations or cardiac awareness No SOB, DOE No near syncope or syncope.  He is pending a new PMD   Device information MDT single chamber ICD implanted 08/15/22   Past Medical History:  Diagnosis Date   Acute ST elevation myocardial infarction (STEMI) involving left anterior descending (LAD) coronary artery (HCC) 04/16/2019   Anoxic brain injury (HCC)    Cardiac arrest (HCC) 04/16/2019   Chronic combined systolic and diastolic congestive heart failure (HCC)    Controlled type 2 diabetes mellitus with hyperglycemia (HCC)    Coronary artery disease involving native coronary artery of native heart without angina pectoris    Jaundice    needed hospitalization in [place]   VT (ventricular tachycardia) (HCC)     Past Surgical History:  Procedure Laterality Date   ICD IMPLANT N/A  08/16/2019   Procedure: ICD IMPLANT;  Surgeon: Regan Lemming, MD;  Location: [facility];  Service: Cardiovascular;  Laterality: N/A;   RIGHT HEART CATH AND CORONARY ANGIOGRAPHY N/A 04/16/2019   Procedure: RIGHT HEART CATH AND CORONARY ANGIOGRAPHY;  Surgeon: Lennette Bihari, MD;  Location: [facility]VASIVE CV [facility];  Service: Cardiovascular;  Laterality: N/A;   VENTRICULAR ASSIST DEVICE INSERTION N/A 04/16/2019   Procedure: VENTRICULAR ASSIST DEVICE INSERTION;  Surgeon: Lennette Bihari, MD;  Location: [facility] CV [facility];  Service: Cardiovascular;  Laterality: N/A;    Current Outpatient Medications  Medication Sig Dispense Refill   blood glucose meter kit and supplies KIT Dispense based on patient and insurance preference. Use up to four times daily as directed. (FOR ICD-9 250.00, 250.01). 1 each 0   spironolactone (ALDACTONE) 25 MG tablet Take 1 tablet (25 mg total) by mouth daily. 90 tablet 3   tamsulosin (FLOMAX) 0.4 MG CAPS capsule TAKE 1 CAPSULE(0.4 MG) BY MOUTH DAILY AFTER SUPPER 90 capsule 1   aspirin 81 MG chewable tablet Chew 1 tablet (81 mg total) by mouth daily. (Patient not taking: Reported on 12/29/2022)     atorvastatin (LIPITOR) 80 MG tablet TAKE 1 TABLET(80 MG) BY MOUTH DAILY AT 6 PM (Patient not taking: Reported on 12/29/2022) 30 tablet 11   B Complex-C (B-COMPLEX WITH VITAMIN C) tablet Take 1 tablet by mouth daily. (Patient not taking: Reported on 12/29/2022)     carvedilol (COREG) 6.25 MG tablet TAKE 1 TABLET(6.25  MG) BY MOUTH TWICE DAILY WITH A MEAL (Patient not taking: Reported on 12/29/2022) 180 tablet 3   empagliflozin ([name]) 10 MG TABS tablet Take 1 tablet (10 mg total) by mouth daily before breakfast. (Patient not taking: Reported on 12/29/2022) 90 tablet 3   sacubitril-valsartan (ENTRESTO) 97-103 MG Take 1 tablet by mouth 2 (two) times daily. (Patient not taking: Reported on 12/29/2022) 180 tablet 3   No current facility-administered medications for this visit.     Allergies:   Patient has no known allergies.   Social History:  The patient  reports that he has quit smoking. His smoking use included cigarettes. He has never used smokeless tobacco. He reports current alcohol use. He reports that he does not use drugs.   Family History:  The patient's family history includes Healthy in his brother and sister.  ROS:  Please see the history of present illness.    All other systems are reviewed and otherwise negative.   PHYSICAL EXAM:  VS:  BP 106/60   Pulse 62   Ht 5\' 4"  (1.626 m)   Wt 190 lb 9.6 oz (86.5 kg)   SpO2 93%   BMI 32.72 kg/m  BMI: Body mass index is 32.72 kg/m. Well nourished, well developed, in no acute distress HEENT: normocephalic, atraumatic Neck: no JVD, carotid bruits or masses Cardiac:   RRR; no significant murmurs, no rubs, or gallops Lungs:   CTA b/l, no wheezing, rhonchi or rales Abd: soft, nontender MS: no deformity or  atrophy Ext:  no edema Skin: warm and dry, no rash Neuro:  No gross deficits appreciated Psych: euthymic mood, full affect  ICD site is stable, no tethering or discomfort   EKG: not done today  Device interrogation done today and reviewed by myself:  Battery and lead measurements are stable One NSVT in jan   07/22/2022: TTE  1. Left ventricular ejection fraction, by estimation, is 50 to 55%. The  left ventricle has low normal function. The left ventricle demonstrates  regional wall motion abnormalities (see scoring diagram/findings for  description). Left ventricular diastolic   parameters are indeterminate. There is hypokinesis of the left  ventricular, mid-apical anteroseptal wall and inferoseptal wall.   2. Right ventricular systolic function is normal. The right ventricular  size is normal. There is normal pulmonary artery systolic pressure.   3. The mitral valve was not well visualized. Trivial mitral valve  regurgitation. No evidence of mitral stenosis.   4. The aortic valve is  tricuspid. There is mild calcification of the  aortic valve. Aortic valve regurgitation is mild. Aortic valve  sclerosis/calcification is present, without any evidence of aortic  stenosis.   Comparison(s): Prior images reviewed side by side.    04/16/2019: LHC Prox RCA lesion is 30% stenosed. Mid RCA lesion is 20% stenosed. Prox LAD to Mid LAD lesion is 100% stenosed. Prox Cx lesion is 30% stenosed. 1st Mrg lesion is 70% stenosed.   Out of hospital cardiac arrest with postarrest ECG evidence of 6 -8 mm ST elevation in the lateral and anterolateral leads.   Total proximal occlusion of the LAD with faint collateralization both from the left circumflex as well as distal RCA.   Left circumflex vessel with 30% proximal stenosis with 70% OM1 stenosis.   Dominant RCA with 30% mid stenosis 20% distally which supplies a PDA and PLA vessel.  There is evidence for septal collateralization to the mid/distal LAD.   Moderate elevation of right heart pressures with severely reduced cardiac index  at 1.3 and cardiac power output at 0.6.   Unsuccessful and aborted attempt at PCI to the totally occluded ostial LAD due to inability to cross the lesion with multiple wires and catheters.   Successful insertion of Impella CP for hemodynamic support.   Ventricular tachycardia requiring synchronized cardioversion.   RECOMMENDATION: The patient will be transported to the coronary care unit.  Dr. Gala Romney and advanced heart failure team as well as critical care medicine with hypothermic support will be following patient.   Recent Labs: 04/28/2022: Hemoglobin 16.4; Platelets 199 07/12/2022: B Natriuretic Peptide 134.6 09/13/2022: ALT 30; BUN 31; Creatinine, Ser 1.21; Potassium 4.5; Sodium 140  09/13/2022: Cholesterol 131; HDL 46; LDL Cholesterol 66; Total CHOL/HDL Ratio 2.8; Triglycerides 97; VLDL 19   CrCl cannot be calculated (Patient's most recent lab result is older than the maximum 21 days allowed.).    Wt Readings from Last 3 Encounters:  12/29/22 190 lb 9.6 oz (86.5 kg)  07/12/22 190 lb 12.8 oz (86.5 kg)  04/26/22 193 lb (87.5 kg)     Other studies reviewed: Additional studies/records reviewed today include: summarized above  ASSESSMENT AND PLAN:  Cardiac arrest ICD Intact function No programming changes made  CAD No symptoms On ASA, BB, statin C/w Dr. Lynne Logan  Chronic CHF 9systolic) ICM Recovered LVEf Volume stable OptiVol looks good On BB/Entresto, spironolactone, jardiance C/w Dr. Gala Romney and team   Disposition: F/u with remotes as usual, in clinic with EP in a year again, sooner if needed  Current medicines are reviewed at length with the patient today.  The patient did not have any concerns regarding medicines.  Norma Fredrickson, PA-C 12/29/2022 1:34 PM     [facility] [address] [place] [address] [phone] (office)  [phone] (fax)

## 2022-12-29 NOTE — Patient Instructions (Signed)
Medication Instructions:   Your physician recommends that you continue on your current medications as directed. Please refer to the Current Medication list given to you today.  *If you need a refill on your cardiac medications before your next appointment, please call your pharmacy*   Lab Work: NONE ORDERED  TODAY   If you have labs (blood work) drawn today and your tests are completely normal, you will receive your results only by: MyChart Message (if you have MyChart) OR A paper copy in the mail If you have any lab test that is abnormal or we need to change your treatment, we will call you to review the results.   Testing/Procedures: NONE ORDERED  TODAY     Follow-Up: At [facility], you and your health needs are our priority.  As part of our continuing mission to provide you with exceptional heart care, we have created designated Provider Care Teams.  These Care Teams include your primary Cardiologist (physician) and Advanced Practice Providers (APPs -  Physician Assistants and Nurse Practitioners) who all work together to provide you with the care you need, when you need it.  We recommend signing up for the patient portal called "MyChart".  Sign up information is provided on this After Visit Summary.  MyChart is used to connect with patients for Virtual Visits (Telemedicine).  Patients are able to view lab/test results, encounter notes, upcoming appointments, etc.  Non-urgent messages can be sent to your provider as well.   To learn more about what you can do with MyChart, go to [email or web address].    Your next appointment:   1 year(s)  Provider:   You may see Will Martin Camnitz, MD or one of the following Advanced Practice Providers on your designated Care Team:   Renee Ursuy, PA-C Michael "Andy" Tillery, PA-C  Other Instructions  

## 2022-12-30 LAB — BASIC METABOLIC PANEL
BUN/Creatinine Ratio: 18 (ref 10–24)
BUN: 21 mg/dL (ref 8–27)
CO2: 23 mmol/L (ref 20–29)
Calcium: 9.4 mg/dL (ref 8.6–10.2)
Chloride: 111 mmol/L — ABNORMAL HIGH (ref 96–106)
Creatinine, Ser: 1.19 mg/dL (ref 0.76–1.27)
Glucose: 122 mg/dL — ABNORMAL HIGH (ref 70–99)
Potassium: 4.6 mmol/L (ref 3.5–5.2)
Sodium: 145 mmol/L — ABNORMAL HIGH (ref 134–144)
eGFR: 65 mL/min/{1.73_m2} (ref >59)

## 2022-12-30 LAB — MAGNESIUM: Magnesium: 2.2 mg/dL (ref 1.6–2.3)

## 2023-02-01 ENCOUNTER — Other Ambulatory Visit (HOSPITAL_COMMUNITY): Payer: Self-pay

## 2023-02-14 ENCOUNTER — Ambulatory Visit (INDEPENDENT_AMBULATORY_CARE_PROVIDER_SITE_OTHER): Payer: HMO

## 2023-02-14 DIAGNOSIS — I469 Cardiac arrest, cause unspecified: Secondary | ICD-10-CM

## 2023-02-15 LAB — CUP PACEART REMOTE DEVICE CHECK
Battery Remaining Longevity: 101 mo
Battery Voltage: 3.01 V
Brady Statistic RV Percent Paced: 0.18 %
Date Time Interrogation Session: 20240624033424
HighPow Impedance: 70 Ohm
Implantable Lead Connection Status: 753985
Implantable Lead Implant Date: 20201224
Implantable Lead Location: 753860
Implantable Pulse Generator Implant Date: 20201224
Lead Channel Impedance Value: 247 Ohm
Lead Channel Impedance Value: 304 Ohm
Lead Channel Pacing Threshold Amplitude: 0.75 V
Lead Channel Pacing Threshold Pulse Width: 0.4 ms
Lead Channel Sensing Intrinsic Amplitude: 6.375 mV
Lead Channel Sensing Intrinsic Amplitude: 6.375 mV
Lead Channel Setting Pacing Amplitude: 2 V
Lead Channel Setting Pacing Pulse Width: 0.4 ms
Lead Channel Setting Sensing Sensitivity: 0.3 mV
Zone Setting Status: 755011
Zone Setting Status: 755011

## 2023-03-02 ENCOUNTER — Other Ambulatory Visit (HOSPITAL_COMMUNITY): Payer: Self-pay

## 2023-03-03 NOTE — Progress Notes (Signed)
Remote ICD transmission.   

## 2023-04-06 ENCOUNTER — Other Ambulatory Visit (HOSPITAL_COMMUNITY): Payer: Self-pay

## 2023-04-06 MED ORDER — CARVEDILOL 6.25 MG PO TABS: 6.2500 mg | ORAL_TABLET | Freq: Two times a day (BID) | ORAL | 0 refills | Status: DC

## 2023-04-25 ENCOUNTER — Other Ambulatory Visit (HOSPITAL_COMMUNITY): Payer: Self-pay | Admitting: Internal Medicine

## 2023-04-28 ENCOUNTER — Other Ambulatory Visit (HOSPITAL_BASED_OUTPATIENT_CLINIC_OR_DEPARTMENT_OTHER): Payer: Self-pay

## 2023-04-28 ENCOUNTER — Other Ambulatory Visit: Payer: Self-pay

## 2023-04-28 MED ORDER — EMPAGLIFLOZIN 10 MG PO TABS
10.0000 mg | ORAL_TABLET | Freq: Every day | ORAL | 0 refills | Status: DC
  Filled 2023-04-28: qty 90, 90d supply, fill #0

## 2023-05-16 ENCOUNTER — Ambulatory Visit (INDEPENDENT_AMBULATORY_CARE_PROVIDER_SITE_OTHER): Payer: PPO

## 2023-05-16 DIAGNOSIS — I255 Ischemic cardiomyopathy: Secondary | ICD-10-CM

## 2023-05-16 DIAGNOSIS — I5022 Chronic systolic (congestive) heart failure: Secondary | ICD-10-CM

## 2023-05-16 LAB — CUP PACEART REMOTE DEVICE CHECK
Battery Remaining Longevity: 98 mo
Battery Voltage: 3.01 V
Brady Statistic RV Percent Paced: 0.15 %
Date Time Interrogation Session: 20240923043724
HighPow Impedance: 66 Ohm
Implantable Lead Connection Status: 753985
Implantable Lead Implant Date: 20201224
Implantable Lead Location: 753860
Implantable Pulse Generator Implant Date: 20201224
Lead Channel Impedance Value: 285 Ohm
Lead Channel Impedance Value: 304 Ohm
Lead Channel Pacing Threshold Amplitude: 0.75 V
Lead Channel Pacing Threshold Pulse Width: 0.4 ms
Lead Channel Sensing Intrinsic Amplitude: 7 mV
Lead Channel Sensing Intrinsic Amplitude: 7 mV
Lead Channel Setting Pacing Amplitude: 2 V
Lead Channel Setting Pacing Pulse Width: 0.4 ms
Lead Channel Setting Sensing Sensitivity: 0.3 mV
Zone Setting Status: 755011
Zone Setting Status: 755011

## 2023-05-26 DIAGNOSIS — Z6830 Body mass index (BMI) 30.0-30.9, adult: Secondary | ICD-10-CM | POA: Diagnosis not present

## 2023-05-26 DIAGNOSIS — E119 Type 2 diabetes mellitus without complications: Secondary | ICD-10-CM | POA: Diagnosis not present

## 2023-05-26 DIAGNOSIS — E782 Mixed hyperlipidemia: Secondary | ICD-10-CM | POA: Diagnosis not present

## 2023-05-26 DIAGNOSIS — I2510 Atherosclerotic heart disease of native coronary artery without angina pectoris: Secondary | ICD-10-CM | POA: Diagnosis not present

## 2023-05-26 DIAGNOSIS — Z1330 Encounter for screening examination for mental health and behavioral disorders, unspecified: Secondary | ICD-10-CM | POA: Diagnosis not present

## 2023-05-26 DIAGNOSIS — E66811 Obesity, class 1: Secondary | ICD-10-CM | POA: Diagnosis not present

## 2023-05-27 NOTE — Progress Notes (Signed)
Remote ICD transmission.   

## 2023-05-30 ENCOUNTER — Telehealth (HOSPITAL_COMMUNITY): Payer: Self-pay

## 2023-05-30 NOTE — Telephone Encounter (Signed)
Received a fax requesting medical records from [facility]. Records were successfully faxed to: [phone] ,which was the number provided.. Medical request form will be scanned into patients chart.

## 2023-07-01 ENCOUNTER — Other Ambulatory Visit (HOSPITAL_COMMUNITY): Payer: Self-pay | Admitting: Internal Medicine

## 2023-07-05 ENCOUNTER — Other Ambulatory Visit (HOSPITAL_COMMUNITY): Payer: Self-pay | Admitting: Family Medicine

## 2023-07-13 ENCOUNTER — Encounter (HOSPITAL_COMMUNITY): Payer: Self-pay

## 2023-07-13 ENCOUNTER — Ambulatory Visit (HOSPITAL_COMMUNITY)
Admission: RE | Admit: 2023-07-13 | Discharge: 2023-07-13 | Disposition: A | Discharge status: Home / Self Care | Payer: HMO | Source: Ambulatory Visit | Attending: Family Medicine

## 2023-07-13 VITALS — BP 110/78 | HR 54 | Wt 192.0 lb

## 2023-07-13 DIAGNOSIS — Z79899 Other long term (current) drug therapy: Secondary | ICD-10-CM | POA: Insufficient documentation

## 2023-07-13 DIAGNOSIS — I2510 Atherosclerotic heart disease of native coronary artery without angina pectoris: Secondary | ICD-10-CM | POA: Diagnosis not present

## 2023-07-13 DIAGNOSIS — I252 Old myocardial infarction: Secondary | ICD-10-CM | POA: Insufficient documentation

## 2023-07-13 DIAGNOSIS — E1122 Type 2 diabetes mellitus with diabetic chronic kidney disease: Secondary | ICD-10-CM | POA: Insufficient documentation

## 2023-07-13 DIAGNOSIS — R001 Bradycardia, unspecified: Secondary | ICD-10-CM | POA: Diagnosis not present

## 2023-07-13 DIAGNOSIS — I255 Ischemic cardiomyopathy: Secondary | ICD-10-CM | POA: Insufficient documentation

## 2023-07-13 DIAGNOSIS — Z8674 Personal history of sudden cardiac arrest: Secondary | ICD-10-CM | POA: Insufficient documentation

## 2023-07-13 DIAGNOSIS — I469 Cardiac arrest, cause unspecified: Secondary | ICD-10-CM | POA: Diagnosis not present

## 2023-07-13 DIAGNOSIS — I5022 Chronic systolic (congestive) heart failure: Secondary | ICD-10-CM | POA: Insufficient documentation

## 2023-07-13 DIAGNOSIS — N1831 Chronic kidney disease, stage 3a: Secondary | ICD-10-CM | POA: Insufficient documentation

## 2023-07-13 DIAGNOSIS — Z87891 Personal history of nicotine dependence: Secondary | ICD-10-CM | POA: Insufficient documentation

## 2023-07-13 DIAGNOSIS — Z95810 Presence of automatic (implantable) cardiac defibrillator: Secondary | ICD-10-CM | POA: Insufficient documentation

## 2023-07-13 LAB — COMPREHENSIVE METABOLIC PANEL
ALT: 35 U/L (ref 0–44)
AST: 34 U/L (ref 15–41)
Albumin: 3.9 g/dL (ref 3.5–5.0)
Alkaline Phosphatase: 73 U/L (ref 38–126)
Anion gap: 9 (ref 5–15)
BUN: 28 mg/dL — ABNORMAL HIGH (ref 8–23)
CO2: 24 mmol/L (ref 22–32)
Calcium: 9.2 mg/dL (ref 8.9–10.3)
Chloride: 109 mmol/L (ref 98–111)
Creatinine, Ser: 1.28 mg/dL — ABNORMAL HIGH (ref 0.61–1.24)
GFR, Estimated: 59 mL/min — ABNORMAL LOW (ref >60)
Glucose, Bld: 111 mg/dL — ABNORMAL HIGH (ref 70–99)
Potassium: 3.8 mmol/L (ref 3.5–5.1)
Sodium: 142 mmol/L (ref 135–145)
Total Bilirubin: 0.9 mg/dL (ref <1.2)
Total Protein: 6.9 g/dL (ref 6.5–8.1)

## 2023-07-13 LAB — LIPID PANEL
Cholesterol: 125 mg/dL (ref 0–200)
HDL: 47 mg/dL (ref >40)
LDL Cholesterol: 61 mg/dL (ref 0–99)
Total CHOL/HDL Ratio: 2.7 {ratio}
Triglycerides: 87 mg/dL (ref <150)
VLDL: 17 mg/dL (ref 0–40)

## 2023-07-13 NOTE — Addendum Note (Signed)
Encounter addended by: Demetrius Charity, RN on: 07/13/2023 11:35 AM  Actions taken: Flowsheet accepted, Order list changed, Diagnosis association updated, Clinical Note Signed, Charge Capture section accepted

## 2023-07-13 NOTE — Progress Notes (Signed)
Advanced Heart Failure Clinic Note   PCP: Janeece Agee, NP HF Cardiologist: Dr. Gala Romney  HPI: Lawrence Olson is a 72 y.o. male from [place] with h/o CAD, systolic HF and VF cardiac arrest.   He had no known medical problems until 04/16/19 when he experienced witnessed VF arrest at work. Had bystander CPR x 20 mins with defib and intubation in field. He was taken to cath lab for emergent catheterization. Attempts at opening LAD unsuccessful (felt to be chronic). Impella placed. EF 25-30%. Had protracted course with respiratory failure, renal failure (requiring CVVHD) and anoxic brain injury. There was also a question of acute amio pneumonitis with ESR> 100. Discharged to CIR. Creatinine plateaued at 2.4 so ACE/ARB/ARNI not started.   Had ICD placed 08/16/19 by Dr. Elberta Fortis   Follow up 11/22, doing well NYHA I and euvolemic.    Admitted 9/23 with COVID, given IV remdesivir and decadron. Stable from HF standpoint, had mild AKI. No GDMT changes.  Echo 11/23 EF 50-55%, RV ok  Today he returns for HF follow up with his wife. Overall feeling fine. Works in the garden and goes for walks with his dog, no SOB with this. Denies palpitations, abnormal bleeding, CP, dizziness, edema, or PND/Orthopnea. Appetite ok. No fever or chills. Weight at home 190 pounds. Taking all medications.   Cardiac Studies  - Echo 11/23: EF 50-55%, RV ok  - Echo 10/23/20: EF 40-45% RV ok  - Echo 12/20 EF 30-35%   ROS: All systems negative except as listed in HPI, PMH and Problem List.  SH:  Social History   Socioeconomic History   Marital status: Married    Spouse name: Not on file   Number of children: Not on file   Years of education: Not on file   Highest education level: Not on file  Occupational History   Not on file  Tobacco Use   Smoking status: Former    Types: Cigarettes   Smokeless tobacco: Never  Vaping Use   Vaping status: Never Used  Substance and Sexual Activity   Alcohol use: Yes     Comment: rarely   Drug use: Never   Sexual activity: Not Currently  Other Topics Concern   Not on file  Social History Narrative   Not on file   Social Determinants of Health   Financial Resource Strain: Medium Risk (02/09/2021)   Overall Financial Resource Strain (CARDIA)    Difficulty of Paying Living Expenses: Somewhat hard  Food Insecurity: No Food Insecurity (04/30/2022)   Hunger Vital Sign    Worried About Running Out of Food in the Last Year: Never true    Ran Out of Food in the Last Year: Never true  Transportation Needs: No Transportation Needs (04/30/2022)   PRAPARE - Administrator, Civil Service (Medical): No    Lack of Transportation (Non-Medical): No  Physical Activity: Sufficiently Active (02/09/2021)   Exercise Vital Sign    Days of Exercise per Week: 5 days    Minutes of Exercise per Session: 30 min  Stress: No Stress Concern Present (02/09/2021)   [employer] of Occupational Health - Occupational Stress Questionnaire    Feeling of Stress : Not at all  Social Connections: Moderately Isolated (02/09/2021)   Social Connection and Isolation Panel [NHANES]    Frequency of Communication with Friends and Family: Three times a week    Frequency of Social Gatherings with Friends and Family: Once a week    Attends Religious Services: Never  Active Member of Clubs or Organizations: No    Attends Banker Meetings: Never    Marital Status: Married  Catering manager Violence: Not At Risk (06/08/2019)   Humiliation, Afraid, Rape, and Kick questionnaire    Fear of Current or Ex-Partner: No    Emotionally Abused: No    Physically Abused: No    Sexually Abused: No   FH:  Family History  Problem Relation Age of Onset   Healthy Brother    Healthy Sister    Past Medical History:  Diagnosis Date   Acute ST elevation myocardial infarction (STEMI) involving left anterior descending (LAD) coronary artery (HCC) 04/16/2019   Anoxic brain injury (HCC)     Cardiac arrest (HCC) 04/16/2019   Chronic combined systolic and diastolic congestive heart failure (HCC)    Controlled type 2 diabetes mellitus with hyperglycemia (HCC)    Coronary artery disease involving native coronary artery of native heart without angina pectoris    Jaundice    needed hospitalization in [place]   VT (ventricular tachycardia) (HCC)    Current Outpatient Medications  Medication Sig Dispense Refill   aspirin 81 MG chewable tablet Chew 1 tablet (81 mg total) by mouth daily.     atorvastatin (LIPITOR) 80 MG tablet TAKE 1 TABLET(80 MG) BY MOUTH DAILY AT 6 PM 30 tablet 11   B Complex-C (B-COMPLEX WITH VITAMIN C) tablet Take 1 tablet by mouth daily.     blood glucose meter kit and supplies KIT Dispense based on patient and insurance preference. Use up to four times daily as directed. (FOR ICD-9 250.00, 250.01). 1 each 0   carvedilol (COREG) 6.25 MG tablet TAKE 1 TABLET(6.25 MG) BY MOUTH TWICE DAILY WITH A MEAL 180 tablet 0   empagliflozin ([name]) 10 MG TABS tablet Take 1 tablet (10 mg total) by mouth daily before breakfast. 90 tablet 0   sacubitril-valsartan (ENTRESTO) 97-103 MG Take 1 tablet by mouth 2 (two) times daily. 180 tablet 3   tamsulosin (FLOMAX) 0.4 MG CAPS capsule TAKE 1 CAPSULE(0.4 MG) BY MOUTH DAILY AFTER SUPPER 90 capsule 1   spironolactone (ALDACTONE) 25 MG tablet Take 1 tablet (25 mg total) by mouth daily. 90 tablet 3   No current facility-administered medications for this encounter.    BP 110/78   Pulse (!) 54   Wt 87.1 kg (192 lb)   SpO2 94%   BMI 32.96 kg/m   Wt Readings from Last 3 Encounters:  07/13/23 87.1 kg (192 lb)  12/29/22 86.5 kg (190 lb 9.6 oz)  07/12/22 86.5 kg (190 lb 12.8 oz)   PHYSICAL EXAM: General:  NAD. No resp difficulty, walked into clinic HEENT: Normal Neck: Supple. No JVD. Carotids 2+ bilat; no bruits. No lymphadenopathy or thryomegaly appreciated. Cor: PMI nondisplaced. Regular rate & rhythm. No rubs, gallops or  murmurs. Lungs: Clear Abdomen: Soft, nontender, nondistended. No hepatosplenomegaly. No bruits or masses. Good bowel sounds. Extremities: No cyanosis, clubbing, rash, edema Neuro: Alert & oriented x 3, cranial nerves grossly intact. Moves all 4 extremities w/o difficulty. Affect pleasant.  Device interrogation (personally reviewed): OptiVol ok but creeping up, thoracic impedence just below reference line, no VT or AF, 4.5 hrs/day activity, 99.8% VS  ECG (personally reviewed): SB 56 bpm  ASSESSMENT & PLAN: 1. Chronic systolic HF - Echo 9/20 EF 25-30% in setting of cardiac arrest and severe iCM - Echo 12/20 EF 30-35% - Echo 10/23/20: EF 40-45% RV ok  - Echo 3/23: EF 50-55% - s/p MDT ICD (Dr.  Caminitz)  - ICD interrogated personally. See above - Doing great. NYHA I. Volume looks good. - Continue spironolactone 25 mg daily. - Continue carvedilol 6.25 mg bid  - Continue Entresto 97/103 mg bid - Continue [name] 10 mg daily. - Doing great. On great meds.  - Labs today. - Update echo at next visit  2. CAD s/p recent anterior MI  - treated medically as unable to open LAD CTO - No s/s ischemia  Films reviewed with IC team and not options for PCI - No chest pain - Continue ASA/statin/[name] - Last LDL 66. Check lipids today.  3. H/o VF arrest - s/p MDT ICD.  - Interrogation as above.  4. DM2  - Continue [name].  - No GU symtpoms  5. CKD 3a - last creatinine 1.19 - Continue SGLT2i. - Labs today.  Follow up in 9 months with Dr. Gala Romney + echo  Jacklynn Ganong, FNP  11:15 AM

## 2023-07-13 NOTE — Addendum Note (Signed)
Encounter addended by: Orlene Och, [place] on: 07/13/2023 11:30 AM  Actions taken: Clinical Note Signed

## 2023-07-13 NOTE — Patient Instructions (Addendum)
Thank you for coming in today  If you had labs drawn today, any labs that are abnormal the clinic will call you No news is good news  Medications: No changes   Follow up appointments:  Your physician recommends that you schedule a follow-up appointment in:  9 months withWith Dr. Gala Romney with echocardiogram You will receive a reminder letter in the mail a few months in advance. If you don't receive a letter, please call our office to schedule the follow-up appointment.  Your physician has requested that you have an echocardiogram. Echocardiography is a painless test that uses sound waves to create images of your heart. It provides your doctor with information about the size and shape of your heart and how well your heart's chambers and valves are working. This procedure takes approximately one hour. There are no restrictions for this procedure.      Do the following things EVERYDAY: Weigh yourself in the morning before breakfast. Write it down and keep it in a log. Take your medicines as prescribed Eat low salt foods--Limit salt (sodium) to 2000 mg per day.  Stay as active as you can everyday Limit all fluids for the day to less than 2 liters   At the [facility] Clinic, you and your health needs are our priority. As part of our continuing mission to provide you with exceptional heart care, we have created designated Provider Care Teams. These Care Teams include your primary Cardiologist (physician) and Advanced Practice Providers (APPs- Physician Assistants and Nurse Practitioners) who all work together to provide you with the care you need, when you need it.   You may see any of the following providers on your designated Care Team at your next follow up: Dr Arvilla Meres Dr Marca Ancona Dr. Marcos Eke, NP Robbie Lis, [place] [facility] [place], [place] Brynda Peon, NP Karle Plumber, PharmD   Please be sure to bring in all your  medications bottles to every appointment.    Thank you for choosing [facility]  If you have any questions or concerns before your next appointment please send [place] a message through [place] or call our office at [phone].    TO LEAVE A MESSAGE FOR THE NURSE SELECT OPTION 2, PLEASE LEAVE A MESSAGE INCLUDING: YOUR NAME DATE OF BIRTH CALL BACK NUMBER REASON FOR CALL**this is important as we prioritize the call backs  YOU WILL RECEIVE A CALL BACK THE SAME DAY AS LONG AS YOU CALL BEFORE 4:00 PM

## 2023-07-25 ENCOUNTER — Other Ambulatory Visit (HOSPITAL_COMMUNITY): Payer: Self-pay

## 2023-07-25 ENCOUNTER — Other Ambulatory Visit: Payer: Self-pay

## 2023-07-25 ENCOUNTER — Other Ambulatory Visit (HOSPITAL_COMMUNITY): Payer: Self-pay | Admitting: Internal Medicine

## 2023-07-25 MED ORDER — EMPAGLIFLOZIN 10 MG PO TABS
10.0000 mg | ORAL_TABLET | Freq: Every day | ORAL | 0 refills | Status: DC
  Filled 2023-07-25 – 2023-08-05 (×3): qty 90, 90d supply, fill #0

## 2023-07-26 DIAGNOSIS — E782 Mixed hyperlipidemia: Secondary | ICD-10-CM | POA: Diagnosis not present

## 2023-07-26 DIAGNOSIS — Z0000 Encounter for general adult medical examination without abnormal findings: Secondary | ICD-10-CM | POA: Diagnosis not present

## 2023-07-26 DIAGNOSIS — Z6831 Body mass index (BMI) 31.0-31.9, adult: Secondary | ICD-10-CM | POA: Diagnosis not present

## 2023-07-26 DIAGNOSIS — D72829 Elevated white blood cell count, unspecified: Secondary | ICD-10-CM | POA: Diagnosis not present

## 2023-07-26 DIAGNOSIS — E119 Type 2 diabetes mellitus without complications: Secondary | ICD-10-CM | POA: Diagnosis not present

## 2023-07-26 DIAGNOSIS — I2510 Atherosclerotic heart disease of native coronary artery without angina pectoris: Secondary | ICD-10-CM | POA: Diagnosis not present

## 2023-07-26 DIAGNOSIS — I5022 Chronic systolic (congestive) heart failure: Secondary | ICD-10-CM | POA: Diagnosis not present

## 2023-07-28 ENCOUNTER — Other Ambulatory Visit: Payer: Self-pay

## 2023-08-03 ENCOUNTER — Other Ambulatory Visit (HOSPITAL_COMMUNITY): Payer: Self-pay

## 2023-08-04 ENCOUNTER — Other Ambulatory Visit (HOSPITAL_COMMUNITY): Payer: Self-pay

## 2023-08-04 ENCOUNTER — Telehealth (HOSPITAL_COMMUNITY): Payer: Self-pay | Admitting: Pharmacy Technician

## 2023-08-04 NOTE — Telephone Encounter (Signed)
Advanced Heart Failure Patient Advocate Encounter  The patient was approved for a [employer] grant that will help cover the cost of [name], Entresto, Coreg and Spironolactone. Total amount awarded, $10,000. Eligibility, 07/05/23 - 07/03/24.  ID [id number]  BIN [id number]  PCN [id number]  Group [id number]  Billing information added to [facility]. Called [facility], updated RX from #10 to a full 90 day supply. Called and updated patients daughter, Charlott Rakes.  Archer Asa, CPhT

## 2023-08-05 ENCOUNTER — Other Ambulatory Visit: Payer: Self-pay

## 2023-08-05 ENCOUNTER — Other Ambulatory Visit (HOSPITAL_COMMUNITY): Payer: Self-pay

## 2023-08-15 ENCOUNTER — Ambulatory Visit (INDEPENDENT_AMBULATORY_CARE_PROVIDER_SITE_OTHER): Payer: PPO

## 2023-08-15 DIAGNOSIS — I5022 Chronic systolic (congestive) heart failure: Secondary | ICD-10-CM | POA: Diagnosis not present

## 2023-08-15 DIAGNOSIS — I255 Ischemic cardiomyopathy: Secondary | ICD-10-CM

## 2023-08-15 LAB — CUP PACEART REMOTE DEVICE CHECK
Battery Remaining Longevity: 94 mo
Battery Voltage: 3.01 V
Brady Statistic RV Percent Paced: 0.15 %
Date Time Interrogation Session: 20241223001603
HighPow Impedance: 71 Ohm
Implantable Lead Connection Status: 753985
Implantable Lead Implant Date: 20201224
Implantable Lead Location: 753860
Implantable Pulse Generator Implant Date: 20201224
Lead Channel Impedance Value: 304 Ohm
Lead Channel Impedance Value: 342 Ohm
Lead Channel Pacing Threshold Amplitude: 0.875 V
Lead Channel Pacing Threshold Pulse Width: 0.4 ms
Lead Channel Sensing Intrinsic Amplitude: 8.375 mV
Lead Channel Sensing Intrinsic Amplitude: 8.375 mV
Lead Channel Setting Pacing Amplitude: 2 V
Lead Channel Setting Pacing Pulse Width: 0.4 ms
Lead Channel Setting Sensing Sensitivity: 0.3 mV
Zone Setting Status: 755011
Zone Setting Status: 755011

## 2023-08-22 ENCOUNTER — Other Ambulatory Visit: Payer: Self-pay

## 2023-08-22 ENCOUNTER — Other Ambulatory Visit (HOSPITAL_BASED_OUTPATIENT_CLINIC_OR_DEPARTMENT_OTHER): Payer: Self-pay

## 2023-09-20 NOTE — Progress Notes (Signed)
Remote ICD transmission.

## 2023-10-01 ENCOUNTER — Other Ambulatory Visit (HOSPITAL_COMMUNITY): Payer: Self-pay | Admitting: Internal Medicine

## 2023-10-27 ENCOUNTER — Other Ambulatory Visit (HOSPITAL_COMMUNITY): Payer: Self-pay | Admitting: Internal Medicine

## 2023-10-27 ENCOUNTER — Other Ambulatory Visit (HOSPITAL_COMMUNITY): Payer: Self-pay

## 2023-10-27 MED ORDER — EMPAGLIFLOZIN 10 MG PO TABS
10.0000 mg | ORAL_TABLET | Freq: Every day | ORAL | 0 refills | Status: DC
  Filled 2023-11-05 (×2): qty 60, 60d supply, fill #0

## 2023-11-05 ENCOUNTER — Other Ambulatory Visit (HOSPITAL_COMMUNITY): Payer: Self-pay

## 2023-11-14 ENCOUNTER — Ambulatory Visit (INDEPENDENT_AMBULATORY_CARE_PROVIDER_SITE_OTHER): Payer: PPO

## 2023-11-14 DIAGNOSIS — I255 Ischemic cardiomyopathy: Secondary | ICD-10-CM

## 2023-11-14 DIAGNOSIS — I469 Cardiac arrest, cause unspecified: Secondary | ICD-10-CM

## 2023-11-14 LAB — CUP PACEART REMOTE DEVICE CHECK
Battery Remaining Longevity: 90 mo
Battery Voltage: 3.01 V
Brady Statistic RV Percent Paced: 0.19 %
Date Time Interrogation Session: 20250324033323
HighPow Impedance: 69 Ohm
Implantable Lead Connection Status: 753985
Implantable Lead Implant Date: 20201224
Implantable Lead Location: 753860
Implantable Pulse Generator Implant Date: 20201224
Lead Channel Impedance Value: 247 Ohm
Lead Channel Impedance Value: 304 Ohm
Lead Channel Pacing Threshold Amplitude: 0.75 V
Lead Channel Pacing Threshold Pulse Width: 0.4 ms
Lead Channel Sensing Intrinsic Amplitude: 7.125 mV
Lead Channel Sensing Intrinsic Amplitude: 7.125 mV
Lead Channel Setting Pacing Amplitude: 2 V
Lead Channel Setting Pacing Pulse Width: 0.4 ms
Lead Channel Setting Sensing Sensitivity: 0.3 mV
Zone Setting Status: 755011
Zone Setting Status: 755011

## 2023-11-18 ENCOUNTER — Encounter: Payer: Self-pay | Admitting: Physician Assistant

## 2023-11-21 ENCOUNTER — Other Ambulatory Visit (HOSPITAL_COMMUNITY): Payer: Self-pay

## 2023-11-21 ENCOUNTER — Other Ambulatory Visit (HOSPITAL_COMMUNITY): Payer: Self-pay | Admitting: Internal Medicine

## 2023-11-21 ENCOUNTER — Other Ambulatory Visit (HOSPITAL_BASED_OUTPATIENT_CLINIC_OR_DEPARTMENT_OTHER): Payer: Self-pay

## 2023-11-21 MED ORDER — ENTRESTO 97-103 MG PO TABS
1.0000 | ORAL_TABLET | Freq: Two times a day (BID) | ORAL | 3 refills | Status: AC
  Filled 2023-11-21: qty 180, 90d supply, fill #0
  Filled 2024-02-13: qty 180, 90d supply, fill #1
  Filled 2024-05-13: qty 180, 90d supply, fill #2
  Filled 2024-08-11: qty 180, 90d supply, fill #3

## 2023-12-27 NOTE — Progress Notes (Unsigned)
 Cardiology Office Note Date:  12/27/2023  Patient ID:  Lawrence Olson, DOB 1951/01/08, MRN [id number] PCP:  Ulyess Gammons, NP  Cardiologist:  Dr. Julane Ny Electrophysiologist: Dr. Lawana Pray     Chief Complaint: annual visit  History of Present Illness: Lawrence Olson is a 73 y.o. male with history of CAD, HTN, HLD, cardiac arrest, chronic CHF (systolic), ICM, CKD (IIIa), DM  On 04/16/2019 he experienced a witnessed arrest while at work. He had bystander CPR for 20 minutes. Had emergent left heart catheterization but had had a totally occluded LAD and was not able to be reopened. Impella was placed and he was taken to the ICU. His ejection fraction was found to be 25 to 30%. He had a protracted course complicated by respiratory failure, renal failure, anoxic brain injury. He was discharged with a LifeVest.   ICD implanted  He saw Dr. Lawana Pray 12/07/21, doing well, no symptoms reported, no changes made  He saw the HF team 07/12/22, doing well, active, no changes made.  I saw him 12/29/22 He is accompanied by his wife.   He walks his dog every day a couple miles, stays very active and feels quite well No CP, palpitations or cardiac awareness No SOB, DOE No near syncope or syncope. He is pending a new PMD Device with normal function Rare NSVT No changes made  He saw HF team 07/13/23, gardening, walking, denied symptoms, intolerances, volume stable, medication compliant.  Planned for labs, and consider echo at his next visit. No changes were made  TODAY  *** OptiVol *** symptoms   Device information MDT single chamber ICD implanted 08/15/22   Past Medical History:  Diagnosis Date   Acute ST elevation myocardial infarction (STEMI) involving left anterior descending (LAD) coronary artery (HCC) 04/16/2019   Anoxic brain injury (HCC)    Cardiac arrest (HCC) 04/16/2019   Chronic combined systolic and diastolic congestive heart failure (HCC)    Controlled type 2 diabetes  mellitus with hyperglycemia (HCC)    Coronary artery disease involving native coronary artery of native heart without angina pectoris    Jaundice    needed hospitalization in [place]   VT (ventricular tachycardia) (HCC)     Past Surgical History:  Procedure Laterality Date   ICD IMPLANT N/A 08/16/2019   Procedure: ICD IMPLANT;  Surgeon: Lei Pump, MD;  Location: [facility] INVASIVE CV LAB;  Service: Cardiovascular;  Laterality: N/A;   RIGHT HEART CATH AND CORONARY ANGIOGRAPHY N/A 04/16/2019   Procedure: RIGHT HEART CATH AND CORONARY ANGIOGRAPHY;  Surgeon: Millicent Ally, MD;  Location: [facility] INVASIVE CV LAB;  Service: Cardiovascular;  Laterality: N/A;   VENTRICULAR ASSIST DEVICE INSERTION N/A 04/16/2019   Procedure: VENTRICULAR ASSIST DEVICE INSERTION;  Surgeon: Millicent Ally, MD;  Location: [facility] INVASIVE CV LAB;  Service: Cardiovascular;  Laterality: N/A;    Current Outpatient Medications  Medication Sig Dispense Refill   aspirin  81 MG chewable tablet Chew 1 tablet (81 mg total) by mouth daily.     atorvastatin  (LIPITOR ) 80 MG tablet TAKE 1 TABLET(80 MG) BY MOUTH DAILY AT 6 PM 30 tablet 11   B Complex-C (B-COMPLEX WITH VITAMIN C ) tablet Take 1 tablet by mouth daily.     blood glucose meter kit and supplies KIT Dispense based on patient and insurance preference. Use up to four times daily as directed. (FOR ICD-9 250.00, 250.01). 1 each 0   carvedilol  (COREG ) 6.25 MG tablet TAKE 1 TABLET(6.25 MG) BY MOUTH TWICE DAILY WITH A MEAL 180  tablet 0   empagliflozin  ([name] ) 10 MG TABS tablet Take 1 tablet (10 mg total) by mouth daily before breakfast. 60 tablet 0   sacubitril -valsartan  (ENTRESTO ) 97-103 MG Take 1 tablet by mouth 2 (two) times daily. 180 tablet 3   spironolactone  (ALDACTONE ) 25 MG tablet Take 1 tablet (25 mg total) by mouth daily. 90 tablet 3   tamsulosin  (FLOMAX ) 0.4 MG CAPS capsule TAKE 1 CAPSULE(0.4 MG) BY MOUTH DAILY AFTER SUPPER 90 capsule 1   No current  facility-administered medications for this visit.    Allergies:   Patient has no known allergies.   Social History:  The patient  reports that he has quit smoking. His smoking use included cigarettes. He has never used smokeless tobacco. He reports current alcohol use. He reports that he does not use drugs.   Family History:  The patient's family history includes Healthy in his brother and sister.  ROS:  Please see the history of present illness.    All other systems are reviewed and otherwise negative.   PHYSICAL EXAM:  VS:  There were no vitals taken for this visit. BMI: There is no height or weight on file to calculate BMI. Well nourished, well developed, in no acute distress HEENT: normocephalic, atraumatic Neck: no JVD, carotid bruits or masses Cardiac: *** RRR; no significant murmurs, no rubs, or gallops Lungs:  *** CTA b/l, no wheezing, rhonchi or rales Abd: soft, nontender MS: no deformity or  atrophy Ext: *** no edema Skin: warm and dry, no rash Neuro:  No gross deficits appreciated Psych: euthymic mood, full affect  *** ICD site is stable, no tethering or discomfort   EKG: not done today  Device interrogation done today and reviewed by myself:  *** Battery and lead measurements are stable ***  07/22/2022: TTE  1. Left ventricular ejection fraction, by estimation, is 50 to 55%. The  left ventricle has low normal function. The left ventricle demonstrates  regional wall motion abnormalities (see scoring diagram/findings for  description). Left ventricular diastolic   parameters are indeterminate. There is hypokinesis of the left  ventricular, mid-apical anteroseptal wall and inferoseptal wall.   2. Right ventricular systolic function is normal. The right ventricular  size is normal. There is normal pulmonary artery systolic pressure.   3. The mitral valve was not well visualized. Trivial mitral valve  regurgitation. No evidence of mitral stenosis.   4. The aortic  valve is tricuspid. There is mild calcification of the  aortic valve. Aortic valve regurgitation is mild. Aortic valve  sclerosis/calcification is present, without any evidence of aortic  stenosis.   Comparison(s): Prior images reviewed side by side.    04/16/2019: LHC Prox RCA lesion is 30% stenosed. Mid RCA lesion is 20% stenosed. Prox LAD to Mid LAD lesion is 100% stenosed. Prox Cx lesion is 30% stenosed. 1st Mrg lesion is 70% stenosed.   Out of hospital cardiac arrest with postarrest ECG evidence of 6 -8 mm ST elevation in the lateral and anterolateral leads.   Total proximal occlusion of the LAD with faint collateralization both from the left circumflex as well as distal RCA.   Left circumflex vessel with 30% proximal stenosis with 70% OM1 stenosis.   Dominant RCA with 30% mid stenosis 20% distally which supplies a PDA and PLA vessel.  There is evidence for septal collateralization to the mid/distal LAD.   Moderate elevation of right heart pressures with severely reduced cardiac index at 1.3 and cardiac power output at 0.6.  Unsuccessful and aborted attempt at PCI to the totally occluded ostial LAD due to inability to cross the lesion with multiple wires and catheters.   Successful insertion of Impella CP for hemodynamic support.   Ventricular tachycardia requiring synchronized cardioversion.   RECOMMENDATION: The patient will be transported to the coronary care unit.  Dr. Bensimhon and advanced heart failure team as well as critical care medicine with hypothermic support will be following patient.   Recent Labs: 12/29/2022: Magnesium  2.2 07/13/2023: ALT 35; BUN 28; Creatinine, Ser 1.28; Potassium 3.8; Sodium 142  07/13/2023: Cholesterol 125; HDL 47; LDL Cholesterol 61; Total CHOL/HDL Ratio 2.7; Triglycerides 87; VLDL 17   CrCl cannot be calculated (Patient's most recent lab result is older than the maximum 21 days allowed.).   Wt Readings from Last 3 Encounters:   07/13/23 192 lb (87.1 kg)  12/29/22 190 lb 9.6 oz (86.5 kg)  07/12/22 190 lb 12.8 oz (86.5 kg)     Other studies reviewed: Additional studies/records reviewed today include: summarized above  ASSESSMENT AND PLAN:  Cardiac arrest ICD ***Intact function *** No programming changes made  CAD *** No symptoms *** On ASA, BB, statin C/w Dr. Roena Clark  Chronic CHF 9systolic) ICM Recovered LVEF byhis echo 2023 *** Volume stable *** OptiVol looks good *** On BB/Entresto , spironolactone , jardiance  C/w Dr. Bensimhon and team   Disposition: F/u with remotes as usual, in clinic with EP in *** a year again, sooner if needed  Current medicines are reviewed at length with the patient today.  The patient did not have any concerns regarding medicines.  [place], PA-C 12/27/2023 5:00 PM     [facility] [address] [place] [address] [phone] (office)  [phone] (fax)

## 2023-12-28 ENCOUNTER — Other Ambulatory Visit (HOSPITAL_COMMUNITY): Payer: Self-pay | Admitting: Internal Medicine

## 2023-12-29 ENCOUNTER — Ambulatory Visit: Attending: Physician Assistant | Admitting: Physician Assistant

## 2023-12-29 ENCOUNTER — Other Ambulatory Visit (HOSPITAL_COMMUNITY): Payer: Self-pay

## 2023-12-29 ENCOUNTER — Encounter: Payer: Self-pay | Admitting: Physician Assistant

## 2023-12-29 VITALS — BP 118/68 | HR 57 | Ht 66.0 in | Wt 191.2 lb

## 2023-12-29 DIAGNOSIS — Z95810 Presence of automatic (implantable) cardiac defibrillator: Secondary | ICD-10-CM | POA: Diagnosis not present

## 2023-12-29 DIAGNOSIS — I5022 Chronic systolic (congestive) heart failure: Secondary | ICD-10-CM

## 2023-12-29 DIAGNOSIS — I2510 Atherosclerotic heart disease of native coronary artery without angina pectoris: Secondary | ICD-10-CM | POA: Diagnosis not present

## 2023-12-29 DIAGNOSIS — I255 Ischemic cardiomyopathy: Secondary | ICD-10-CM

## 2023-12-29 LAB — CUP PACEART INCLINIC DEVICE CHECK
Battery Remaining Longevity: 85 mo
Battery Voltage: 3 V
Brady Statistic RV Percent Paced: 0.16 %
Date Time Interrogation Session: 20250508181026
HighPow Impedance: 69 Ohm
Implantable Lead Connection Status: 753985
Implantable Lead Implant Date: 20201224
Implantable Lead Location: 753860
Implantable Pulse Generator Implant Date: 20201224
Lead Channel Impedance Value: 285 Ohm
Lead Channel Impedance Value: 361 Ohm
Lead Channel Pacing Threshold Amplitude: 0.875 V
Lead Channel Pacing Threshold Pulse Width: 0.4 ms
Lead Channel Sensing Intrinsic Amplitude: 7.125 mV
Lead Channel Sensing Intrinsic Amplitude: 8 mV
Lead Channel Setting Pacing Amplitude: 2 V
Lead Channel Setting Pacing Pulse Width: 0.4 ms
Lead Channel Setting Sensing Sensitivity: 0.3 mV
Zone Setting Status: 755011
Zone Setting Status: 755011

## 2023-12-29 NOTE — Patient Instructions (Signed)
 Medication Instructions:    Your physician recommends that you continue on your current medications as directed. Please refer to the Current Medication list given to you today.   *If you need a refill on your cardiac medications before your next appointment, please call your pharmacy*  Lab Work:  NONE ORDERED  TODAY    If you have labs (blood work) drawn today and your tests are completely normal, you will receive your results only by: MyChart Message (if you have MyChart) OR A paper copy in the mail If you have any lab test that is abnormal or we need to change your treatment, we will call you to review the results.  Testing/Procedures: NONE ORDERED  TODAY    Follow-Up: At [facility], you and your health needs are our priority.  As part of our continuing mission to provide you with exceptional heart care, our providers are all part of one team.  This team includes your primary Cardiologist (physician) and Advanced Practice Providers or APPs (Physician Assistants and Nurse Practitioners) who all work together to provide you with the care you need, when you need it.  Your next appointment:    1 year(s)    You may see Will Cortland Ding, MD or one of the following Advanced Practice Providers on your designated Care Team:   Mertha Abrahams, [place]    We recommend signing up for the patient portal called "MyChart".  Sign up information is provided on this After Visit Summary.  MyChart is used to connect with patients for Virtual Visits (Telemedicine).  Patients are able to view lab/test results, encounter notes, upcoming appointments, etc.  Non-urgent messages can be sent to your provider as well.   To learn more about what you can do with MyChart, go to [email or web address].   Other Instructions

## 2023-12-29 NOTE — Progress Notes (Signed)
 Remote ICD transmission.

## 2023-12-30 ENCOUNTER — Other Ambulatory Visit (HOSPITAL_COMMUNITY): Payer: Self-pay | Admitting: Internal Medicine

## 2023-12-30 ENCOUNTER — Other Ambulatory Visit (HOSPITAL_COMMUNITY): Payer: Self-pay

## 2023-12-30 MED ORDER — EMPAGLIFLOZIN 10 MG PO TABS
10.0000 mg | ORAL_TABLET | Freq: Every day | ORAL | 0 refills | Status: DC
  Filled 2024-01-02: qty 60, 60d supply, fill #0

## 2024-01-02 ENCOUNTER — Other Ambulatory Visit: Payer: Self-pay

## 2024-01-02 ENCOUNTER — Other Ambulatory Visit (HOSPITAL_COMMUNITY): Payer: Self-pay

## 2024-01-08 ENCOUNTER — Ambulatory Visit: Payer: Self-pay | Admitting: Cardiology

## 2024-02-13 ENCOUNTER — Ambulatory Visit (INDEPENDENT_AMBULATORY_CARE_PROVIDER_SITE_OTHER): Payer: PPO

## 2024-02-13 DIAGNOSIS — I255 Ischemic cardiomyopathy: Secondary | ICD-10-CM

## 2024-02-13 DIAGNOSIS — I5022 Chronic systolic (congestive) heart failure: Secondary | ICD-10-CM

## 2024-02-14 LAB — CUP PACEART REMOTE DEVICE CHECK
Battery Remaining Longevity: 85 mo
Battery Voltage: 2.96 V
Brady Statistic RV Percent Paced: 0.07 %
Date Time Interrogation Session: 20250624012506
HighPow Impedance: 73 Ohm
Implantable Lead Connection Status: 753985
Implantable Lead Implant Date: 20201224
Implantable Lead Location: 753860
Implantable Pulse Generator Implant Date: 20201224
Lead Channel Impedance Value: 285 Ohm
Lead Channel Impedance Value: 342 Ohm
Lead Channel Pacing Threshold Amplitude: 0.75 V
Lead Channel Pacing Threshold Pulse Width: 0.4 ms
Lead Channel Sensing Intrinsic Amplitude: 6.625 mV
Lead Channel Sensing Intrinsic Amplitude: 6.625 mV
Lead Channel Setting Pacing Amplitude: 2 V
Lead Channel Setting Pacing Pulse Width: 0.4 ms
Lead Channel Setting Sensing Sensitivity: 0.3 mV
Zone Setting Status: 755011
Zone Setting Status: 755011

## 2024-02-15 ENCOUNTER — Ambulatory Visit: Payer: Self-pay | Admitting: Cardiology

## 2024-02-27 ENCOUNTER — Other Ambulatory Visit (HOSPITAL_COMMUNITY): Payer: Self-pay

## 2024-02-27 ENCOUNTER — Other Ambulatory Visit (HOSPITAL_COMMUNITY): Payer: Self-pay | Admitting: Internal Medicine

## 2024-02-27 MED ORDER — EMPAGLIFLOZIN 10 MG PO TABS
10.0000 mg | ORAL_TABLET | Freq: Every day | ORAL | 0 refills | Status: DC
  Filled 2024-03-06: qty 60, 60d supply, fill #0

## 2024-03-05 ENCOUNTER — Other Ambulatory Visit (HOSPITAL_COMMUNITY): Payer: Self-pay | Admitting: Internal Medicine

## 2024-03-05 ENCOUNTER — Other Ambulatory Visit (HOSPITAL_COMMUNITY): Payer: Self-pay

## 2024-03-06 ENCOUNTER — Other Ambulatory Visit (HOSPITAL_COMMUNITY): Payer: Self-pay

## 2024-03-06 ENCOUNTER — Other Ambulatory Visit: Payer: Self-pay

## 2024-03-09 ENCOUNTER — Other Ambulatory Visit (HOSPITAL_COMMUNITY): Payer: Self-pay

## 2024-03-16 NOTE — Addendum Note (Signed)
 Addended by: TAWNI DRILLING D on: 03/16/2024 05:27 PM   Modules accepted: Orders

## 2024-03-16 NOTE — Progress Notes (Signed)
 Remote ICD transmission.

## 2024-03-31 ENCOUNTER — Other Ambulatory Visit (HOSPITAL_COMMUNITY): Payer: Self-pay | Admitting: Internal Medicine

## 2024-04-28 ENCOUNTER — Other Ambulatory Visit (HOSPITAL_COMMUNITY): Payer: Self-pay | Admitting: Internal Medicine

## 2024-04-30 ENCOUNTER — Other Ambulatory Visit (HOSPITAL_COMMUNITY): Payer: Self-pay

## 2024-04-30 MED ORDER — EMPAGLIFLOZIN 10 MG PO TABS
10.0000 mg | ORAL_TABLET | Freq: Every day | ORAL | 2 refills | Status: AC
  Filled 2024-05-05 – 2024-05-07 (×2): qty 90, 90d supply, fill #0
  Filled 2024-08-09: qty 90, 90d supply, fill #1

## 2024-05-06 ENCOUNTER — Other Ambulatory Visit (HOSPITAL_COMMUNITY): Payer: Self-pay

## 2024-05-07 ENCOUNTER — Other Ambulatory Visit (HOSPITAL_COMMUNITY): Payer: Self-pay

## 2024-05-08 ENCOUNTER — Other Ambulatory Visit (HOSPITAL_COMMUNITY): Payer: Self-pay

## 2024-05-14 ENCOUNTER — Ambulatory Visit (INDEPENDENT_AMBULATORY_CARE_PROVIDER_SITE_OTHER): Payer: PPO

## 2024-05-14 DIAGNOSIS — I255 Ischemic cardiomyopathy: Secondary | ICD-10-CM

## 2024-05-15 LAB — CUP PACEART REMOTE DEVICE CHECK
Battery Remaining Longevity: 81 mo
Battery Voltage: 3 V
Brady Statistic RV Percent Paced: 0.15 %
Date Time Interrogation Session: 20250922043824
HighPow Impedance: 71 Ohm
Implantable Lead Connection Status: 753985
Implantable Lead Implant Date: 20201224
Implantable Lead Location: 753860
Implantable Pulse Generator Implant Date: 20201224
Lead Channel Impedance Value: 247 Ohm
Lead Channel Impedance Value: 304 Ohm
Lead Channel Pacing Threshold Amplitude: 0.875 V
Lead Channel Pacing Threshold Pulse Width: 0.4 ms
Lead Channel Sensing Intrinsic Amplitude: 6.75 mV
Lead Channel Sensing Intrinsic Amplitude: 6.75 mV
Lead Channel Setting Pacing Amplitude: 2 V
Lead Channel Setting Pacing Pulse Width: 0.4 ms
Lead Channel Setting Sensing Sensitivity: 0.3 mV
Zone Setting Status: 755011
Zone Setting Status: 755011

## 2024-05-15 NOTE — Progress Notes (Signed)
Remote ICD Transmission.

## 2024-05-16 ENCOUNTER — Ambulatory Visit: Payer: Self-pay | Admitting: Cardiology

## 2024-05-17 ENCOUNTER — Other Ambulatory Visit (HOSPITAL_COMMUNITY): Payer: Self-pay

## 2024-06-03 ENCOUNTER — Other Ambulatory Visit (HOSPITAL_COMMUNITY): Payer: Self-pay | Admitting: Internal Medicine

## 2024-07-02 ENCOUNTER — Telehealth (HOSPITAL_COMMUNITY): Payer: Self-pay

## 2024-07-02 ENCOUNTER — Other Ambulatory Visit (HOSPITAL_COMMUNITY): Payer: Self-pay

## 2024-07-02 NOTE — Telephone Encounter (Signed)
 Advanced Heart Failure Patient Advocate Encounter  The patient was approved for a [employer] grant that will help cover the cost of Carvedilol , Entresto , [name] .  Total amount awarded, $7,500.  Effective: 07/04/2024 - 07/03/2025.  BIN [id number] PCN [id number] Group [id number] ID [id number]  Pharmacy provided with approval and processing information. Patient daughter informed via phone.  Rachel DEL, CPhT Rx Patient Advocate Phone: [phone]

## 2024-07-03 ENCOUNTER — Ambulatory Visit (HOSPITAL_COMMUNITY)
Admission: RE | Admit: 2024-07-03 | Discharge: 2024-07-03 | Disposition: A | Discharge status: Home / Self Care | Source: Ambulatory Visit | Attending: Internal Medicine | Admitting: Internal Medicine

## 2024-07-03 ENCOUNTER — Encounter (HOSPITAL_COMMUNITY): Admitting: Internal Medicine

## 2024-07-03 DIAGNOSIS — I2510 Atherosclerotic heart disease of native coronary artery without angina pectoris: Secondary | ICD-10-CM | POA: Diagnosis not present

## 2024-07-03 DIAGNOSIS — I358 Other nonrheumatic aortic valve disorders: Secondary | ICD-10-CM | POA: Insufficient documentation

## 2024-07-03 DIAGNOSIS — I77810 Thoracic aortic ectasia: Secondary | ICD-10-CM | POA: Diagnosis not present

## 2024-07-03 DIAGNOSIS — I5022 Chronic systolic (congestive) heart failure: Secondary | ICD-10-CM | POA: Insufficient documentation

## 2024-07-03 DIAGNOSIS — I252 Old myocardial infarction: Secondary | ICD-10-CM | POA: Insufficient documentation

## 2024-07-03 LAB — ECHOCARDIOGRAM COMPLETE
AV Mean grad: 1 mmHg
AV Peak grad: 2.5 mmHg
Ao pk vel: 0.8 m/s
Area-P 1/2: 3.6 cm2
Calc EF: 51.4 %
S' Lateral: 3.7 cm
Single Plane A2C EF: 52.8 %
Single Plane A4C EF: 51 %

## 2024-07-04 ENCOUNTER — Ambulatory Visit (HOSPITAL_COMMUNITY)
Admission: RE | Admit: 2024-07-04 | Discharge: 2024-07-04 | Disposition: A | Discharge status: Home / Self Care | Source: Ambulatory Visit | Attending: Internal Medicine | Admitting: Internal Medicine

## 2024-07-04 VITALS — BP 114/70 | HR 57 | Wt 188.4 lb

## 2024-07-04 DIAGNOSIS — G931 Anoxic brain damage, not elsewhere classified: Secondary | ICD-10-CM | POA: Diagnosis not present

## 2024-07-04 DIAGNOSIS — N1831 Chronic kidney disease, stage 3a: Secondary | ICD-10-CM | POA: Diagnosis not present

## 2024-07-04 DIAGNOSIS — E1122 Type 2 diabetes mellitus with diabetic chronic kidney disease: Secondary | ICD-10-CM | POA: Diagnosis not present

## 2024-07-04 DIAGNOSIS — Z4502 Encounter for adjustment and management of automatic implantable cardiac defibrillator: Secondary | ICD-10-CM | POA: Insufficient documentation

## 2024-07-04 DIAGNOSIS — Z95810 Presence of automatic (implantable) cardiac defibrillator: Secondary | ICD-10-CM | POA: Diagnosis not present

## 2024-07-04 DIAGNOSIS — Z7982 Long term (current) use of aspirin: Secondary | ICD-10-CM | POA: Diagnosis not present

## 2024-07-04 DIAGNOSIS — Z7984 Long term (current) use of oral hypoglycemic drugs: Secondary | ICD-10-CM | POA: Diagnosis not present

## 2024-07-04 DIAGNOSIS — I252 Old myocardial infarction: Secondary | ICD-10-CM | POA: Insufficient documentation

## 2024-07-04 DIAGNOSIS — Z8674 Personal history of sudden cardiac arrest: Secondary | ICD-10-CM | POA: Insufficient documentation

## 2024-07-04 DIAGNOSIS — I2510 Atherosclerotic heart disease of native coronary artery without angina pectoris: Secondary | ICD-10-CM | POA: Insufficient documentation

## 2024-07-04 DIAGNOSIS — I5022 Chronic systolic (congestive) heart failure: Secondary | ICD-10-CM | POA: Insufficient documentation

## 2024-07-04 DIAGNOSIS — Z79899 Other long term (current) drug therapy: Secondary | ICD-10-CM | POA: Diagnosis not present

## 2024-07-04 LAB — LIPID PANEL
Cholesterol: 119 mg/dL (ref 0–200)
HDL: 41 mg/dL (ref >40)
LDL Cholesterol: 51 mg/dL (ref 0–99)
Total CHOL/HDL Ratio: 2.9 ratio
Triglycerides: 137 mg/dL (ref <150)
VLDL: 27 mg/dL (ref 0–40)

## 2024-07-04 LAB — COMPREHENSIVE METABOLIC PANEL WITH GFR
ALT: 28 U/L (ref 0–44)
AST: 27 U/L (ref 15–41)
Albumin: 4 g/dL (ref 3.5–5.0)
Alkaline Phosphatase: 77 U/L (ref 38–126)
Anion gap: 10 (ref 5–15)
BUN: 27 mg/dL — ABNORMAL HIGH (ref 8–23)
CO2: 24 mmol/L (ref 22–32)
Calcium: 9.3 mg/dL (ref 8.9–10.3)
Chloride: 107 mmol/L (ref 98–111)
Creatinine, Ser: 1.04 mg/dL (ref 0.61–1.24)
GFR, Estimated: >60 mL/min (ref >60)
Glucose, Bld: 117 mg/dL — ABNORMAL HIGH (ref 70–99)
Potassium: 4.5 mmol/L (ref 3.5–5.1)
Sodium: 141 mmol/L (ref 135–145)
Total Bilirubin: 1.1 mg/dL (ref 0.0–1.2)
Total Protein: 7.2 g/dL (ref 6.5–8.1)

## 2024-07-04 LAB — BRAIN NATRIURETIC PEPTIDE: B Natriuretic Peptide: 124.5 pg/mL — ABNORMAL HIGH (ref 0.0–100.0)

## 2024-07-04 NOTE — Patient Instructions (Signed)
 Good to  see you today!   Labs done today, your results will be available in MyChart, we will contact you for abnormal readings.  Your physician recommends that you schedule a follow-up appointment in: 12 months( November 2026) Call office in September 2026 to schedule an appointment  If you have any questions or concerns before your next appointment please send us  a message through [place] or call our office at [phone].    TO LEAVE A MESSAGE FOR THE NURSE SELECT OPTION 2, PLEASE LEAVE A MESSAGE INCLUDING: YOUR NAME DATE OF BIRTH CALL BACK NUMBER REASON FOR CALL**this is important as we prioritize the call backs  YOU WILL RECEIVE A CALL BACK THE SAME DAY AS LONG AS YOU CALL BEFORE 4:00 PM At the Advanced Heart Failure Clinic, you and your health needs are our priority. As part of our continuing mission to provide you with exceptional heart care, we have created designated Provider Care Teams. These Care Teams include your primary Cardiologist (physician) and Advanced Practice Providers (APPs- Physician Assistants and Nurse Practitioners) who all work together to provide you with the care you need, when you need it.   You may see any of the following providers on your designated Care Team at your next follow up: Dr Toribio Fuel Dr Ezra Shuck Dr. Morene Brownie Greig Mosses, NP Caffie Shed, [place] [facility] [place], [place] Beckey Coe, NP Jordan Lee, NP Ellouise Class, NP Tinnie Redman, PharmD Jaun Bash, PharmD   Please be sure to bring in all your medications bottles to every appointment.    Thank you for choosing [facility]

## 2024-07-04 NOTE — Addendum Note (Signed)
 Encounter addended by: Tabias Swayze M, RN on: 07/04/2024 10:02 AM  Actions taken: Order list changed, Diagnosis association updated, Clinical Note Signed

## 2024-07-04 NOTE — Progress Notes (Signed)
 Advanced Heart Failure Clinic Note   PCP: Kline, Chianne, PA-C HF Cardiologist: Dr. Coburn Knaus  HPI: Lawrence Olson is a 73 y.o. male from [place] with h/o CAD, systolic HF and VF cardiac arrest.   He had no known medical problems until 04/16/19 when he experienced witnessed VF arrest at work. Had bystander CPR x 20 mins with defib and intubation in field. He was taken to cath lab for emergent catheterization. Attempts at opening LAD unsuccessful (felt to be chronic). Impella placed. EF 25-30%. Had protracted course with respiratory failure, renal failure (requiring CVVHD) and anoxic brain injury. There was also a question of acute amio pneumonitis with ESR> 100. Discharged to CIR. Creatinine plateaued at 2.4 so ACE/ARB/ARNI not started.   Had ICD placed 08/16/19 by Dr. Inocencio   Follow up 11/22, doing well NYHA I and euvolemic.    Admitted 9/23 with COVID, given IV remdesivir  and decadron . Stable from HF standpoint, had mild AKI. No GDMT changes.  Echo 11/23 EF 50-55%, RV ok  Today he returns for HF follow up with his wife. Feels good. Remains active walking his dog 4x/day. No CP or SOB. Compliant with meds. No edema, SOB.    Echo 07/03/24 EF 50-55% RV ok Personally reviewed  ICD: NO VT/AF AL 3.5h/day fluid ok  Personally reviewed    Cardiac Studies  - Echo 11/23: EF 50-55%, RV ok  - Echo 10/23/20: EF 40-45% RV ok  - Echo 12/20 EF 30-35%   ROS: All systems negative except as listed in HPI, PMH and Problem List.  SH:  Social History   Socioeconomic History   Marital status: Married    Spouse name: Not on file   Number of children: Not on file   Years of education: Not on file   Highest education level: Not on file  Occupational History   Not on file  Tobacco Use   Smoking status: Former    Types: Cigarettes   Smokeless tobacco: Never  Vaping Use   Vaping status: Never Used  Substance and Sexual Activity   Alcohol use: Yes    Comment: rarely   Drug use: Never    Sexual activity: Not Currently  Other Topics Concern   Not on file  Social History Narrative   Not on file   Social Drivers of Health   Financial Resource Strain: Medium Risk (02/09/2021)   Overall Financial Resource Strain (CARDIA)    Difficulty of Paying Living Expenses: Somewhat hard  Food Insecurity: No Food Insecurity (04/30/2022)   Hunger Vital Sign    Worried About Running Out of Food in the Last Year: Never true    Ran Out of Food in the Last Year: Never true  Transportation Needs: No Transportation Needs (04/30/2022)   PRAPARE - Administrator, Civil Service (Medical): No    Lack of Transportation (Non-Medical): No  Physical Activity: Sufficiently Active (02/09/2021)   Exercise Vital Sign    Days of Exercise per Week: 5 days    Minutes of Exercise per Session: 30 min  Stress: No Stress Concern Present (02/09/2021)   [employer] of Occupational Health - Occupational Stress Questionnaire    Feeling of Stress : Not at all  Social Connections: Moderately Isolated (02/09/2021)   Social Connection and Isolation Panel    Frequency of Communication with Friends and Family: Three times a week    Frequency of Social Gatherings with Friends and Family: Once a week    Attends Religious Services: Never  Active Member of Clubs or Organizations: No    Attends Banker Meetings: Never    Marital Status: Married  Catering Manager Violence: Not At Risk (06/08/2019)   Humiliation, Afraid, Rape, and Kick questionnaire    Fear of Current or Ex-Partner: No    Emotionally Abused: No    Physically Abused: No    Sexually Abused: No   FH:  Family History  Problem Relation Age of Onset   Healthy Brother    Healthy Sister    Past Medical History:  Diagnosis Date   Acute ST elevation myocardial infarction (STEMI) involving left anterior descending (LAD) coronary artery (HCC) 04/16/2019   Anoxic brain injury (HCC)    Cardiac arrest (HCC) 04/16/2019   Chronic  combined systolic and diastolic congestive heart failure (HCC)    Controlled type 2 diabetes mellitus with hyperglycemia (HCC)    Coronary artery disease involving native coronary artery of native heart without angina pectoris    Jaundice    needed hospitalization in [place]   VT (ventricular tachycardia) (HCC)    Current Outpatient Medications  Medication Sig Dispense Refill   aspirin  81 MG chewable tablet Chew 1 tablet (81 mg total) by mouth daily.     atorvastatin  (LIPITOR ) 80 MG tablet TAKE 1 TABLET(80 MG) BY MOUTH DAILY AT 6 PM 30 tablet 11   B Complex-C (B-COMPLEX WITH VITAMIN C ) tablet Take 1 tablet by mouth daily.     blood glucose meter kit and supplies KIT Dispense based on patient and insurance preference. Use up to four times daily as directed. (FOR ICD-9 250.00, 250.01). 1 each 0   carvedilol  (COREG ) 6.25 MG tablet Take 1 tablet (6.25 mg total) by mouth 2 (two) times daily with a meal. 180 tablet 1   empagliflozin  ([name] ) 10 MG TABS tablet Take 1 tablet (10 mg total) by mouth daily before breakfast. 90 tablet 2   sacubitril -valsartan  (ENTRESTO ) 97-103 MG Take 1 tablet by mouth 2 (two) times daily. 180 tablet 3   No current facility-administered medications for this encounter.    BP 114/70   Pulse (!) 57   Wt 85.5 kg (188 lb 6.4 oz)   SpO2 97%   BMI 30.41 kg/m   Wt Readings from Last 3 Encounters:  07/04/24 85.5 kg (188 lb 6.4 oz)  12/29/23 86.7 kg (191 lb 3.2 oz)  07/13/23 87.1 kg (192 lb)   PHYSICAL EXAM: General:  Sitting up in bed. No resp difficulty HEENT: normal Neck: supple. no JVD.  Cor: Regular rate & rhythm. No rubs, gallops or murmurs. Lungs: clear Abdomen: soft, nontender, nondistended.Good bowel sounds. Extremities: no cyanosis, clubbing, rash, edema Neuro: alert & orientedx3, cranial nerves grossly intact. moves all 4 extremities w/o difficulty. Affect pleasant   Device interrogation (personally reviewed): OptiVol ok but creeping up, thoracic  impedence just below reference line, no VT or AF, 4.5 hrs/day activity, 99.8% VS  ECG (personally reviewed): Sinus 64 bpm anteroseptal Qs Personally reviewed  ASSESSMENT & PLAN:  1. Chronic systolic HF - Echo 9/20 EF 25-30% in setting of cardiac arrest and severe iCM - Echo 12/20 EF 30-35% - Echo 10/23/20: EF 40-45% RV ok  - Echo 3/23: EF 50-55% - Echo 07/03/24 EF 50-55% RV ok Personally reviewed - s/p MDT ICD (Dr. Antonetta)  -ICD: NO VT/AF AL 3.5h/day fluid ok  Personally reviewed - Doing very well. NYHA I . Volume ok  - Continue spironolactone  25 mg daily. - Continue carvedilol  6.25 mg bid  - Continue Entresto   97/103 mg bid - Continue [name]  10 mg daily.  2. CAD s/p anterior MI in 9/20 - treated medically as unable to open LAD CTO - No s/s ischemia  Films reviewed with IC team and not options for PCI - No s/s angina - Continue ASA/statin/[name]  - Last LDL 61. Repeat lipids today.  3. H/o VF arrest - s/p MDT ICD.  - Interrogation as above  4. DM2  - Continue [name] .  - Followed by PCP  5. CKD 3a - last creatinine 1.19 - Continue SGLT2i. - Labs today.   Toribio Fuel, MD  9:48 AM

## 2024-07-06 ENCOUNTER — Other Ambulatory Visit (HOSPITAL_COMMUNITY): Payer: Self-pay

## 2024-07-06 DIAGNOSIS — I5022 Chronic systolic (congestive) heart failure: Secondary | ICD-10-CM

## 2024-07-06 MED ORDER — ATORVASTATIN CALCIUM 80 MG PO TABS: ORAL_TABLET | ORAL | 11 refills | Status: AC

## 2024-07-17 DIAGNOSIS — H40033 Anatomical narrow angle, bilateral: Secondary | ICD-10-CM | POA: Diagnosis not present

## 2024-07-17 DIAGNOSIS — H2513 Age-related nuclear cataract, bilateral: Secondary | ICD-10-CM | POA: Diagnosis not present

## 2024-07-30 ENCOUNTER — Encounter: Payer: Self-pay | Admitting: Internal Medicine

## 2024-08-09 ENCOUNTER — Other Ambulatory Visit (HOSPITAL_COMMUNITY): Payer: Self-pay

## 2024-08-13 ENCOUNTER — Ambulatory Visit (INDEPENDENT_AMBULATORY_CARE_PROVIDER_SITE_OTHER): Payer: PPO

## 2024-08-13 DIAGNOSIS — I5022 Chronic systolic (congestive) heart failure: Secondary | ICD-10-CM | POA: Diagnosis not present

## 2024-08-14 LAB — CUP PACEART REMOTE DEVICE CHECK
Battery Remaining Longevity: 81 mo
Battery Voltage: 2.99 V
Brady Statistic RV Percent Paced: 0.18 %
Date Time Interrogation Session: 20251222052505
HighPow Impedance: 70 Ohm
Implantable Lead Connection Status: 753985
Implantable Lead Implant Date: 20201224
Implantable Lead Location: 753860
Implantable Pulse Generator Implant Date: 20201224
Lead Channel Impedance Value: 247 Ohm
Lead Channel Impedance Value: 304 Ohm
Lead Channel Pacing Threshold Amplitude: 0.75 V
Lead Channel Pacing Threshold Pulse Width: 0.4 ms
Lead Channel Sensing Intrinsic Amplitude: 6.875 mV
Lead Channel Sensing Intrinsic Amplitude: 6.875 mV
Lead Channel Setting Pacing Amplitude: 2 V
Lead Channel Setting Pacing Pulse Width: 0.4 ms
Lead Channel Setting Sensing Sensitivity: 0.3 mV
Zone Setting Status: 755011
Zone Setting Status: 755011

## 2024-08-15 NOTE — Progress Notes (Signed)
 Remote ICD Transmission

## 2024-08-17 ENCOUNTER — Ambulatory Visit: Payer: Self-pay | Admitting: Cardiology
